# Patient Record
Sex: Male | Born: 1940 | Race: White | Hispanic: No | Marital: Married | Smoking: Former smoker
Health system: Southern US, Community
[De-identification: ages and names within clinical notes are randomized; demographics above are authoritative.]

## PROBLEM LIST (undated history)

## (undated) DIAGNOSIS — K219 Gastro-esophageal reflux disease without esophagitis: Secondary | ICD-10-CM

## (undated) DIAGNOSIS — F419 Anxiety disorder, unspecified: Secondary | ICD-10-CM

## (undated) DIAGNOSIS — H269 Unspecified cataract: Secondary | ICD-10-CM

## (undated) DIAGNOSIS — I712 Thoracic aortic aneurysm, without rupture: Secondary | ICD-10-CM

## (undated) DIAGNOSIS — C801 Malignant (primary) neoplasm, unspecified: Secondary | ICD-10-CM

## (undated) DIAGNOSIS — I7121 Aneurysm of the ascending aorta, without rupture: Secondary | ICD-10-CM

## (undated) DIAGNOSIS — I6529 Occlusion and stenosis of unspecified carotid artery: Secondary | ICD-10-CM

## (undated) DIAGNOSIS — T8859XA Other complications of anesthesia, initial encounter: Secondary | ICD-10-CM

## (undated) DIAGNOSIS — T4145XA Adverse effect of unspecified anesthetic, initial encounter: Secondary | ICD-10-CM

## (undated) DIAGNOSIS — I739 Peripheral vascular disease, unspecified: Secondary | ICD-10-CM

## (undated) DIAGNOSIS — J449 Chronic obstructive pulmonary disease, unspecified: Secondary | ICD-10-CM

## (undated) DIAGNOSIS — G8929 Other chronic pain: Secondary | ICD-10-CM

## (undated) DIAGNOSIS — W293XXA Contact with powered garden and outdoor hand tools and machinery, initial encounter: Secondary | ICD-10-CM

## (undated) DIAGNOSIS — N189 Chronic kidney disease, unspecified: Secondary | ICD-10-CM

## (undated) DIAGNOSIS — E7800 Pure hypercholesterolemia, unspecified: Secondary | ICD-10-CM

## (undated) DIAGNOSIS — R0600 Dyspnea, unspecified: Secondary | ICD-10-CM

## (undated) DIAGNOSIS — H9190 Unspecified hearing loss, unspecified ear: Secondary | ICD-10-CM

## (undated) DIAGNOSIS — M545 Low back pain, unspecified: Secondary | ICD-10-CM

## (undated) DIAGNOSIS — I10 Essential (primary) hypertension: Secondary | ICD-10-CM

## (undated) HISTORY — DX: Essential (primary) hypertension: I10

## (undated) HISTORY — DX: Gastro-esophageal reflux disease without esophagitis: K21.9

## (undated) HISTORY — DX: Unspecified cataract: H26.9

## (undated) HISTORY — PX: HEMORRHOID SURGERY: SHX153

## (undated) HISTORY — DX: Anxiety disorder, unspecified: F41.9

## (undated) HISTORY — DX: Malignant (primary) neoplasm, unspecified: C80.1

## (undated) HISTORY — PX: BACK SURGERY: SHX140

## (undated) HISTORY — PX: FINGER REPLANTATION: SHX639

---

## 1984-10-02 HISTORY — PX: POSTERIOR LUMBAR FUSION: SHX6036

## 2007-01-09 ENCOUNTER — Ambulatory Visit: Payer: Self-pay | Admitting: Internal Medicine

## 2007-01-18 ENCOUNTER — Ambulatory Visit: Payer: Self-pay | Admitting: Gastroenterology

## 2007-01-18 ENCOUNTER — Encounter (INDEPENDENT_AMBULATORY_CARE_PROVIDER_SITE_OTHER): Payer: Self-pay | Admitting: Specialist

## 2009-10-28 ENCOUNTER — Ambulatory Visit: Payer: Self-pay | Admitting: Family Medicine

## 2009-10-28 ENCOUNTER — Encounter: Payer: Self-pay | Admitting: Family Medicine

## 2009-10-28 DIAGNOSIS — F17200 Nicotine dependence, unspecified, uncomplicated: Secondary | ICD-10-CM | POA: Insufficient documentation

## 2009-10-28 DIAGNOSIS — H9209 Otalgia, unspecified ear: Secondary | ICD-10-CM | POA: Insufficient documentation

## 2009-10-28 DIAGNOSIS — R030 Elevated blood-pressure reading, without diagnosis of hypertension: Secondary | ICD-10-CM

## 2009-10-28 LAB — CONVERTED CEMR LAB
Calcium: 9.2 mg/dL (ref 8.4–10.5)
Sodium: 137 meq/L (ref 135–145)

## 2009-11-19 ENCOUNTER — Ambulatory Visit: Payer: Self-pay | Admitting: Family Medicine

## 2009-11-19 ENCOUNTER — Encounter: Payer: Self-pay | Admitting: Family Medicine

## 2009-11-19 DIAGNOSIS — I10 Essential (primary) hypertension: Secondary | ICD-10-CM | POA: Insufficient documentation

## 2009-12-03 ENCOUNTER — Ambulatory Visit: Payer: Self-pay | Admitting: Family Medicine

## 2009-12-03 ENCOUNTER — Encounter: Payer: Self-pay | Admitting: Family Medicine

## 2009-12-03 LAB — CONVERTED CEMR LAB
CO2: 24 meq/L (ref 19–32)
Calcium: 8.4 mg/dL (ref 8.4–10.5)
Chloride: 106 meq/L (ref 96–112)
Potassium: 3.9 meq/L (ref 3.5–5.3)
Sodium: 138 meq/L (ref 135–145)

## 2009-12-23 ENCOUNTER — Ambulatory Visit: Payer: Self-pay | Admitting: Family Medicine

## 2010-01-24 ENCOUNTER — Encounter: Payer: Self-pay | Admitting: Family Medicine

## 2010-01-24 ENCOUNTER — Ambulatory Visit: Payer: Self-pay | Admitting: Family Medicine

## 2010-01-24 LAB — CONVERTED CEMR LAB
BUN: 10 mg/dL (ref 6–23)
CO2: 25 meq/L (ref 19–32)
Chloride: 105 meq/L (ref 96–112)
Creatinine, Ser: 1.12 mg/dL (ref 0.40–1.50)

## 2010-04-06 ENCOUNTER — Telehealth: Payer: Self-pay | Admitting: Family Medicine

## 2010-04-06 ENCOUNTER — Ambulatory Visit: Payer: Self-pay | Admitting: Family Medicine

## 2010-04-06 ENCOUNTER — Encounter: Payer: Self-pay | Admitting: Family Medicine

## 2010-04-07 LAB — CONVERTED CEMR LAB
LDL Cholesterol: 114 mg/dL — ABNORMAL HIGH (ref 0–99)
Triglycerides: 119 mg/dL (ref <150)
VLDL: 24 mg/dL (ref 0–40)

## 2010-07-14 ENCOUNTER — Ambulatory Visit: Payer: Self-pay | Admitting: Family Medicine

## 2010-07-14 DIAGNOSIS — L570 Actinic keratosis: Secondary | ICD-10-CM | POA: Insufficient documentation

## 2010-07-14 DIAGNOSIS — S9000XA Contusion of unspecified ankle, initial encounter: Secondary | ICD-10-CM | POA: Insufficient documentation

## 2010-10-25 ENCOUNTER — Encounter (INDEPENDENT_AMBULATORY_CARE_PROVIDER_SITE_OTHER): Payer: Self-pay | Admitting: *Deleted

## 2010-11-01 NOTE — Progress Notes (Signed)
Summary: phnm sg   Phone Note Call from Patient Call back at Home Phone [phone]   Caller: Spouse Summary of Call: Pt did not want wife to come today and she has some concerns wanted to let doctor know that pt is still SOB and smoking - also sleeps a lot - even after just walking around the yard.  she does not want husband to know that she called.   Initial call taken by: De Nurse,  April 06, 2010 9:49 AM  Follow-up for Phone Call        called her and told the summary of the visit, and that at moment no red flags.  She states he is not being honest with [place] at this momnt and he is being more sob recently and is still smoking about 1ppd. she woul really like to have his heart checked out.  Told her at this moment no real problems  Follow-up by: Antoine Primas DO,  April 06, 2010 11:10 AM

## 2010-11-01 NOTE — Assessment & Plan Note (Signed)
Summary: f/up,tcb   Vital Signs:  Patient profile:   70 year old male Height:      71 inches Weight:      185.5 pounds BMI:     25.97 Temp:     98.3 degrees F oral Pulse rate:   88 / minute BP sitting:   102 / 75  (right arm) Cuff size:   regular  Vitals Entered By: Garen Grams LPN (July 14, 2010 2:08 PM) CC: f/u Is Patient Diabetic? No Pain Assessment Patient in pain? no        Primary Care Provider:  Antoine Primas DO  CC:  f/u.  History of Present Illness: BP today:102/75 Taking Meds:yes Side Effects:doing well only dizzy after sitting for a long amount of time never fall or been close. ROS: denies Headache visual changes nausea vomiting abdominal pain numbness in extremities    Tobacco abuse-  5-6 cigs a day down from 1 ppd.  feeling good, ready to start nicorette at the beginning of next week.  Feels fairly confident that he will be able to quit.    Pt hurt his right ankle about two weeks ago when he tripped over a well, hurt for about a week, no longer hurts but still discolored and swollen but can do all regular activities.     Habits & Providers  Alcohol-Tobacco-Diet     Tobacco Status: current     Tobacco Counseling: to quit use of tobacco products     Cigarette Packs/Day: 2.0  Current Medications (verified): 1)  Hydrochlorothiazide 25 Mg Tabs (Hydrochlorothiazide) .... Take 1 Tab By Mouth Daily 2)  Adult Aspirin Ec Low Strength 81 Mg Tbec (Aspirin) .... Take 1 Daily 3)  Centrum  Tabs (Multiple Vitamins-Minerals) .Marland Kitchen.. 1 Daily 4)  Fish Oil 1000 Mg Caps (Omega-3 Fatty Acids) .Marland Kitchen.. 1 Daily 5)  Nicoderm Cq 21 Mg/24hr Pt24 (Nicotine) .Marland Kitchen.. 1 Patch Daily  Allergies (verified): No Known Drug Allergies  Past History:  Past medical, surgical, family and social histories (including risk factors) reviewed, and no changes noted (except as noted below).  Past Medical History: Reviewed history from 04/06/2010 and no changes required. denies all except neck  and lumabar chronic pain  ? hx of high LDL.   Past Surgical History: Reviewed history from 10/28/2009 and no changes required. severe finger L middle.  was sewed back on.   Lumbar diskectomy- 2004  Family History: Reviewed history from 12/23/2009 and no changes required. hard of hearing  Social History: Reviewed history from 04/06/2010 and no changes required. smokes 3 ppd-> down to 1ppd no drink no illicits lives with wife adopted two kids baby sits grandson a lot  as well.   Review of Systems       denies fever, chills, nausea, vomiting, diarrhea or constipation chest pain or SOB  Physical Exam  General:  Well-developed,well-nourished,in no acute distress; alert,appropriate and cooperative throughout examination Head:  actinic keratosis on top of head, forhead and right side of face.  Eyes:  No corneal or conjunctival inflammation noted. EOMI. Perrla.  Mouth:  multiple fillings Lungs:  coarse breath sounds throughout, improved Heart:  Normal rate and regular rhythm. S1 and S2 normal without gallop, murmur, click, rub or other extra sounds. somewhat distant Abdomen:  BS +, NT, ND Pulses:  R and L carotid,radial,femoral,dorsalis pedis and posterior tibial pulses are full and equal bilaterally Extremities:  right ankle has some mild discoloration and swelling non pitting    Impression & Recommendations:  Problem #  1:  HYPERTENSION, BENIGN (ICD-401.1) At goal will continue current treatment, will consider 1/2 tab daily but has made such great strides.  will check again in 3 months and draw labs at that time.  His updated medication list for this problem includes:    Hydrochlorothiazide 25 Mg Tabs (Hydrochlorothiazide) .Marland Kitchen... Take 1 tab by mouth daily  Orders: [facility]- Est  Level 4 (16109)  Problem # 2:  TOBACCO ABUSE (ICD-305.1) Assessment: Improved Pt is improving and has a goal to quit. Also has plan.  Pt declined wanting to go to pharm clinic.  His updated medication  list for this problem includes:    Nicoderm Cq 21 Mg/24hr Pt24 (Nicotine) .Marland Kitchen... 1 patch daily  Orders: [facility]- Est  Level 4 (60454)  Problem # 3:  BRUISED ANKLE (ICD-924.21) doing well full strength and sensation WB without problem.  Will continue.  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  Orders: [facility]- Est  Level 4 (99214) Cryo (2nd - 14th) - [facility] (17003)  Complete Medication List: 1)  Hydrochlorothiazide 25 Mg Tabs (Hydrochlorothiazide) .... Take 1 tab by mouth daily 2)  Adult Aspirin Ec Low Strength 81 Mg Tbec (Aspirin) .... Take 1 daily 3)  Centrum Tabs (Multiple vitamins-minerals) .Marland Kitchen.. 1 daily 4)  Fish Oil 1000 Mg Caps (Omega-3 fatty acids) .Marland Kitchen.. 1 daily 5)  Nicoderm Cq 21 Mg/24hr Pt24 (Nicotine) .Marland Kitchen.. 1 patch daily  Patient Instructions: 1)  I think you are doing great! 2)  Keep trying to get off the cigarettes!  I think you will feel much better and be able to do a lot more 3)  Your blood pressure is great! 4)  We froze some moles on your head today.  They will probably blister for the next week 5)  Show this to your wife 6)  I need to see you again in 3 months  Prevention & Chronic Care Immunizations   Influenza vaccine: Not documented    Tetanus booster: Not documented    Pneumococcal vaccine: Not documented    H. zoster vaccine: Not documented  Colorectal Screening   Hemoccult: Not documented    Colonoscopy: Not documented   Colonoscopy action/deferral: Not indicated  (04/06/2010)   Colonoscopy due: 10/03/2015  Other Screening   PSA: Not documented   PSA action/deferral: Discussed-PSA declined  (04/06/2010)   Smoking status: current  (07/14/2010)   Smoking cessation counseling: YES  (11/19/2009)  Lipids   Total Cholesterol: 172  (04/06/2010)   Lipid panel action/deferral: Lipid Panel ordered   LDL: 114   (04/06/2010)   LDL Direct: Not documented   HDL: 34  (04/06/2010)   Triglycerides: 119  (04/06/2010)   Lipid panel due: 04/07/2011  Hypertension   Last Blood Pressure: 102 / 75  (07/14/2010)   Serum creatinine: 1.12  (01/24/2010)   BMP action: Ordered   Serum potassium 4.2  (01/24/2010)   Basic metabolic panel due: 07/26/2010    Hypertension flowsheet reviewed?: Yes   Progress toward BP goal: At goal    Stage of readiness to change (hypertension management): Maintenance  Self-Management Support :    Hypertension self-management support: Not documented

## 2010-11-01 NOTE — Assessment & Plan Note (Signed)
Summary: FU/KH   Vital Signs:  Patient profile:   70 year old male Height:      71 inches Weight:      192.7 pounds BMI:     26.97 Temp:     97.7 degrees F oral Pulse rate:   103 / minute BP sitting:   165 / 77  (left arm) Cuff size:   regular  Vitals Entered By: Garen Grams LPN (November 19, 2009 2:34 PM) CC: f/u ear pain, BP Is Patient Diabetic? No Pain Assessment Patient in pain? no        Primary Care Provider:  Antoine Primas DO  CC:  f/u ear pain and BP.  History of Present Illness: Pt is here for f/u  1. Ear pain.  Much better since the tx.  Doing much better still has a sharpe pain from time to time but much better only uses a motrin periodically  2.  HTN-  pt now has another elevated bp reading, will have to treat.  Pt did not increase activity and did not chnage his diet much,  pt though made aware that this is of concern and will need to add a medication  3.  tobacco abuse.  still smoking Is contining to cut back as much as he feels fit at moment (inhis words)   Habits & Providers  Alcohol-Tobacco-Diet     Tobacco Status: current     Cigarette Packs/Day: 3.0  Current Medications (verified): 1)  Zyrtec Allergy 10 Mg Caps (Cetirizine Hcl) .... Take 1 Tab At Bedtime 2)  Hydrochlorothiazide 25 Mg Tabs (Hydrochlorothiazide) .... Take 1/2 Once Daily  Allergies (verified): No Known Drug Allergies  Past History:  Past medical, surgical, family and social histories (including risk factors) reviewed, and no changes noted (except as noted below).  Past Medical History: Reviewed history from 10/28/2009 and no changes required. denies all except neck and lumabr   Past Surgical History: Reviewed history from 10/28/2009 and no changes required. severe finger L middle.  was sewed back on.   Lumbar diskectomy- 2004  Family History: Reviewed history from 10/28/2009 and no changes required. deafness  Social History: Reviewed history from 10/28/2009 and no  changes required. smokes 3 ppd no drink no illicits lives with wife adopted two kids  Review of Systems       denies fever, chills, nausea, vomiting, diarrhea or constipation   Physical Exam  General:  Well-developed,well-nourished,in no acute distress; alert,appropriate and cooperative throughout examination Ears:  Left tympanic membrane much improved, minimal erythema, no effusion.  Mouth:  multiple fillings Lungs:  coarse breath sounds throughout Heart:  Normal rate and regular rhythm. S1 and S2 normal without gallop, murmur, click, rub or other extra sounds.   Impression & Recommendations:  Problem # 1:  HYPERTENSION, BENIGN (ICD-401.1) Assessment New Will start HCTZ.  get BMET f/u in 2-4 weeks and recheck BMET His updated medication list for this problem includes:    Hydrochlorothiazide 25 Mg Tabs (Hydrochlorothiazide) .Marland Kitchen... Take 1/2 once daily  Orders: [facility]- Est  Level 4 (99214)  Problem # 2:  EAR PAIN, LEFT (ICD-388.70) Assessment: Unchanged improved, looks better, will monitor Orders: [facility]- Est  Level 4 (19147)  Problem # 3:  TOBACCO ABUSE (ICD-305.1) still smoking but is decreasing.  Still not ready to quit.   Orders: [facility]- Est  Level 4 (99214)  Complete Medication List: 1)  Zyrtec Allergy 10 Mg Caps (Cetirizine hcl) .... Take 1 tab at bedtime 2)  Hydrochlorothiazide 25 Mg Tabs (  Hydrochlorothiazide) .... Take 1/2 once daily  Patient Instructions: 1)  For your head cold I want you to take Zyrtec..  Take tab nightly 2)  Your blood pressure is still high.  We are starting HCTZ take 1/2 tab once daily  3)  I want you to come back and have a blood draw in 2 weeks. 4)  Please try to ride your bike 3 times a week  5)  I want to see you again in 4-6 weeks. 6)  Tobacco is very bad for your health and your loved ones ! You should stop smoking !   Prescriptions: HYDROCHLOROTHIAZIDE 25 MG TABS (HYDROCHLOROTHIAZIDE) take 1/2 once daily  #45 x 3   Entered and Authorized  by:   Antoine Primas DO   Signed by:   Antoine Primas DO on 11/19/2009   Method used:   Print then Give to Patient   RxID:   [id number] ZYRTEC ALLERGY 10 MG CAPS (CETIRIZINE HCL) take 1 tab at bedtime  #31 x 3   Entered and Authorized by:   Antoine Primas DO   Signed by:   Antoine Primas DO on 11/19/2009   Method used:   Print then Give to Patient   RxID:   [id number]

## 2010-11-01 NOTE — Assessment & Plan Note (Signed)
Summary: f/u,blood pressure   Vital Signs:  Patient profile:   70 year old male Weight:      185.3 pounds Temp:     97.9 degrees F oral Pulse rate:   103 / minute Pulse rhythm:   regular BP sitting:   135 / 78  (left arm) Cuff size:   regular  Vitals Entered By: Loralee Pacas CMA (January 24, 2010 11:19 AM)  Primary Care Provider:  Antoine Primas DO   History of Present Illness: Pt is here for f/u  1.  HTN-  pt bp much improved.  Pt has been outside more and has decrease salt intake.  Pt denies ha, visual changes, abdominal pain or other red flags  2.  tobacco abuse.  still smoking Is contining to cut back as much as he feels fit at moment (in his words).  Down to 1ppd from 2ppd  3.  Allergic rhinitis, controlled  Pt has been outside much more doing yardwork.  Feeling good   Current Medications (verified): 1)  Zyrtec Allergy 10 Mg Caps (Cetirizine Hcl) .... Take 1 Tab At Bedtime 2)  Hydrochlorothiazide 25 Mg Tabs (Hydrochlorothiazide) .... Take 1 Tab By Mouth Daily  Allergies (verified): No Known Drug Allergies  Past History:  Past medical, surgical, family and social histories (including risk factors) reviewed, and no changes noted (except as noted below).  Past Medical History: denies all except neck and lumabar chronic pain   Past Surgical History: Reviewed history from 10/28/2009 and no changes required. severe finger L middle.  was sewed back on.   Lumbar diskectomy- 2004  Family History: Reviewed history from 12/23/2009 and no changes required. hard of hearing  Social History: Reviewed history from 10/28/2009 and no changes required. smokes 3 ppd-> down to 1ppd no drink no illicits lives with wife adopted two kids  Physical Exam  General:  Well-developed,well-nourished,in no acute distress; alert,appropriate and cooperative throughout examination Eyes:  No corneal or conjunctival inflammation noted. EOMI. Perrla.  Mouth:  multiple fillings Neck:   No deformities, masses, or tenderness noted. Lungs:  coarse breath sounds throughout, improved Heart:  Normal rate and regular rhythm. S1 and S2 normal without gallop, murmur, click, rub or other extra sounds. somewhat distant Abdomen:  BS +, NT, ND   Impression & Recommendations:  Problem # 1:  HYPERTENSION, BENIGN (ICD-401.1) Assessment Improved Get BMET today.  f/u in 3-4 months His updated medication list for this problem includes:    Hydrochlorothiazide 25 Mg Tabs (Hydrochlorothiazide) .Marland Kitchen... Take 1 tab by mouth daily  Orders: [facility] ([address]-60454) [facility]- Est Level  3 (09811)  Problem # 2:  TOBACCO ABUSE (ICD-305.1) Assessment: Improved improving still not ready to quit yet.   Orders: [facility]- Est Level  3 (91478)  Complete Medication List: 1)  Zyrtec Allergy 10 Mg Caps (Cetirizine hcl) .... Take 1 tab at bedtime 2)  Hydrochlorothiazide 25 Mg Tabs (Hydrochlorothiazide) .... Take 1 tab by mouth daily  Patient Instructions: 1)  I am so glad to see you 2)  I think you are doing great 3)  I need to draw some blood today, I will call you if anything is abnormal 4)  Your blood pressure was good today.  Keep doing what your doing! 5)  I want to see you again in 3-4 months unless you need to see me sooner.  Prevention & Chronic Care Immunizations   Influenza vaccine: Not documented    Tetanus booster: Not documented    Pneumococcal vaccine: Not documented  H. zoster vaccine: Not documented  Colorectal Screening   Hemoccult: Not documented    Colonoscopy: Not documented  Other Screening   PSA: Not documented   Smoking status: current  (12/23/2009)   Smoking cessation counseling: YES  (11/19/2009)  Lipids   Total Cholesterol: Not documented   LDL: Not documented   LDL Direct: Not documented   HDL: Not documented   Triglycerides: Not documented  Hypertension   Last Blood Pressure: 135 / 78  (01/24/2010)   Serum creatinine: 1.06  (12/03/2009)   BMP action:  Ordered   Serum potassium 3.9  (12/03/2009)   Basic metabolic panel due: 07/26/2010    Hypertension flowsheet reviewed?: Yes   Progress toward BP goal: At goal    Stage of readiness to change (hypertension management): Maintenance  Self-Management Support :    Hypertension self-management support: Not documented

## 2010-11-01 NOTE — Assessment & Plan Note (Signed)
Summary: f/u htn, lipids, smoking   Vital Signs:  Patient profile:   70 year old male Height:      71 inches Weight:      183.8 pounds BMI:     25.73 Temp:     98.1 degrees F oral Pulse rate:   61 / minute BP sitting:   122 / 73  (left arm) Cuff size:   regular  Vitals Entered By: Garen Grams LPN (April 06, 1609 10:13 AM) CC: f/u bp Is Patient Diabetic? No Pain Assessment Patient in pain? no        Primary Care Provider:  Antoine Primas DO  CC:  f/u bp.  History of Present Illness: Pt is here for f/u  1.  HTN-  pt bp much improved. At goal 122/73 today.  Pt has been outside more and has decrease salt intake.  Pt denies ha, visual changes, abdominal pain or other red flags.  Has not felt so good in a long time.  2.  tobacco abuse.  still smoking Is contining to cut back as much as he feels fit at moment (in his words).  Down toless than <1/2ppd and is on nicotine patch which has helped.  Pt would like to quit soon but states the habit is hard to break but he does not yearn for a cigerette anymore. Pt states by next time I see him he will quit.  Pt denies much sob but maybe a littl ewith activity especially chansing around his grandson but catches his breath when he stops within minutes and denies any cp or swelling of extremities.   3.  Allergic rhinitis, controlled  4.  Left foot pain, intermittant, shoots from toes to mid foot, never last more than a couple  days and does not know what brings it on, no swelling discoloration, able to still ambulate without trouble. Just wanted to have it looked at   Pt has been outside much more doing yardwork.  Feeling good   Habits & Providers  Alcohol-Tobacco-Diet     Tobacco Status: current     Tobacco Counseling: to quit use of tobacco products  Current Medications (verified): 1)  Hydrochlorothiazide 25 Mg Tabs (Hydrochlorothiazide) .... Take 1 Tab By Mouth Daily 2)  Adult Aspirin Ec Low Strength 81 Mg Tbec (Aspirin) .... Take 1  Daily 3)  Centrum  Tabs (Multiple Vitamins-Minerals) .Marland Kitchen.. 1 Daily 4)  Fish Oil 1000 Mg Caps (Omega-3 Fatty Acids) .Marland Kitchen.. 1 Daily 5)  Nicoderm Cq 21 Mg/24hr Pt24 (Nicotine) .Marland Kitchen.. 1 Patch Daily  Allergies (verified): No Known Drug Allergies  Past History:  Past Medical History: denies all except neck and lumabar chronic pain  ? hx of high LDL.  PMH-FH-SH reviewed-no changes except otherwise noted  Social History: smokes 3 ppd-> down to 1ppd no drink no illicits lives with wife adopted two kids baby sits grandson a lot  as well.   Physical Exam  General:  Well-developed,well-nourished,in no acute distress; alert,appropriate and cooperative throughout examination Head:  Normocephalic and atraumatic without obvious abnormalities. No apparent alopecia or balding. Eyes:  No corneal or conjunctival inflammation noted. EOMI. Perrla.  Mouth:  multiple fillings Lungs:  coarse breath sounds throughout, improved Heart:  Normal rate and regular rhythm. S1 and S2 normal without gallop, murmur, click, rub or other extra sounds. somewhat distant Abdomen:  BS +, NT, ND   Impression & Recommendations:  Problem # 1:  HYPERTENSION, BENIGN (ICD-401.1) Pt doing very well and at goal, will continue  HCTZ 25 and montior, no side effects.  Will see again in 3 months if not earlieer His updated medication list for this problem includes:    Hydrochlorothiazide 25 Mg Tabs (Hydrochlorothiazide) .Marland Kitchen... Take 1 tab by mouth daily  Orders: Lipid-[facility] ([address]-60454) [facility]- Est Level  3 (09811)  Problem # 2:  TOBACCO ABUSE (ICD-305.1) Doing well, cut back considerably since last visit.   Pt declined help from [place] at this time. would like to be completly cut by next visit.  Orders: [facility]- Est Level  3 (91478)  His updated medication list for this problem includes:    Nicoderm Cq 21 Mg/24hr Pt24 (Nicotine) .Marland Kitchen... 1 patch daily  Problem # 3:  Preventive Health Care (ICD-V70.0) Will get lipid panel today, may need  to start stain because pt is alreeady on fish oil.   * wife is concern of pt being sob on occasion but pt does not state this is a problem, wife also doesn't want him to know she called with this information. will call her and tell her at this moment no red flags but don't hesitate to call if needed.   Complete Medication List: 1)  Hydrochlorothiazide 25 Mg Tabs (Hydrochlorothiazide) .... Take 1 tab by mouth daily 2)  Adult Aspirin Ec Low Strength 81 Mg Tbec (Aspirin) .... Take 1 daily 3)  Centrum Tabs (Multiple vitamins-minerals) .Marland Kitchen.. 1 daily 4)  Fish Oil 1000 Mg Caps (Omega-3 fatty acids) .Marland Kitchen.. 1 daily 5)  Nicoderm Cq 21 Mg/24hr Pt24 (Nicotine) .Marland Kitchen.. 1 patch daily  Patient Instructions: 1)  Great to see you 2)  Your blood pressure is great today! 3)  Keep taking all your medication 4)  I want to check your cholesterol today,  5)  You told me by the next time I see you you will no longer be smoing!  That is a great goal and I think you can do it! 6)  I want to see you again in 3 months.   Prevention & Chronic Care Immunizations   Influenza vaccine: Not documented    Tetanus booster: Not documented    Pneumococcal vaccine: Not documented    H. zoster vaccine: Not documented  Colorectal Screening   Hemoccult: Not documented    Colonoscopy: Not documented   Colonoscopy action/deferral: Not indicated  (04/06/2010)   Colonoscopy due: 10/03/2015  Other Screening   PSA: Not documented   PSA action/deferral: Discussed-PSA declined  (04/06/2010)   Smoking status: current  (04/06/2010)   Smoking cessation counseling: YES  (11/19/2009)  Lipids   Total Cholesterol: Not documented   Lipid panel action/deferral: Lipid Panel ordered   LDL: Not documented   LDL Direct: Not documented   HDL: Not documented   Triglycerides: Not documented   Lipid panel due: 04/07/2011  Hypertension   Last Blood Pressure: 122 / 73  (04/06/2010)   Serum creatinine: 1.12  (01/24/2010)   BMP action:  Ordered   Serum potassium 4.2  (01/24/2010)   Basic metabolic panel due: 07/26/2010    Hypertension flowsheet reviewed?: Yes   Progress toward BP goal: At goal    Stage of readiness to change (hypertension management): Maintenance  Self-Management Support :    Hypertension self-management support: Not documented

## 2010-11-01 NOTE — Miscellaneous (Signed)
   Clinical Lists Changes Mrs. Nathaniel Long request that you contact her after Mr. Hoare' appt on 3/24 with you.  She will not be able to attend the appt with him on that day.  If you can call her later that afternoon or the next day to discuss his visit she will appreciate it. Abundio Miu  November 19, 2009 3:09 PM

## 2010-11-01 NOTE — Assessment & Plan Note (Signed)
Summary: F/U [facility]   Vital Signs:  Patient profile:   70 year old male Height:      71 inches Weight:      190.4 pounds BMI:     26.65 Temp:     97.9 degrees F oral Pulse rate:   96 / minute BP sitting:   173 / 79  (right arm) Cuff size:   regular  Vitals Entered By: Garen Grams LPN (December 23, 2009 2:19 PM) CC: f/u BP Is Patient Diabetic? No Pain Assessment Patient in pain? no        Primary Care Provider:  Antoine Primas DO  CC:  f/u BP.  History of Present Illness: Pt is here for f/u  1.  HTN-  pt bp still elevated but mildly improved.  Pt did not increase activity and did not chnage his diet much,  pt though made aware that this is of concern and will increase his medication denies ha, visual changes, abdominal pain or other red flags  2.  tobacco abuse.  still smoking Is contining to cut back as much as he feels fit at moment (in his words).  Continue the same   Pt has been outside much more doing yardwork.  Feeling good   Habits & Providers  Alcohol-Tobacco-Diet     Tobacco Status: current     Cigarette Packs/Day: 2.0  Current Medications (verified): 1)  Zyrtec Allergy 10 Mg Caps (Cetirizine Hcl) .... Take 1 Tab At Bedtime 2)  Hydrochlorothiazide 25 Mg Tabs (Hydrochlorothiazide) .... Take 1 Tab By Mouth Daily  Allergies (verified): No Known Drug Allergies  Past History:  Past medical, surgical, family and social histories (including risk factors) reviewed, and no changes noted (except as noted below).  Past Medical History: Reviewed history from 10/28/2009 and no changes required. denies all except neck and lumabr   Past Surgical History: Reviewed history from 10/28/2009 and no changes required. severe finger L middle.  was sewed back on.   Lumbar diskectomy- 2004  Family History: Reviewed history from 10/28/2009 and no changes required. hard of hearing  Social History: Reviewed history from 10/28/2009 and no changes required. smokes 3 ppd no  drink no illicits lives with wife adopted two kidsPacks/Day:  2.0  Review of Systems       denies fever, chills, nausea, vomiting, diarrhea or constipation   Physical Exam  General:  Well-developed,well-nourished,in no acute distress; alert,appropriate and cooperative throughout examination Eyes:  No corneal or conjunctival inflammation noted. EOMI. Perrla.  Ears:  Left tympanic membrane much improved, minimal erythema, no effusion.  Mouth:  multiple fillings Lungs:  coarse breath sounds throughout Heart:  Normal rate and regular rhythm. S1 and S2 normal without gallop, murmur, click, rub or other extra sounds.   Impression & Recommendations:  Problem # 1:  HYPERTENSION, BENIGN (ICD-401.1) Assessment Improved mildly improved, increase HCTZ to 25mg  daily.  If still  elevated will start beta blocker.  f/u 1 mo Will increase to 1 pill daily for HCTZ His updated medication list for this problem includes:    Hydrochlorothiazide 25 Mg Tabs (Hydrochlorothiazide) .Marland Kitchen... Take 1 tab by mouth daily  Orders: [facility]- Est Level  3 ([address]253)  Problem # 2:  TOBACCO ABUSE (ICD-305.1) Assessment: Unchanged still does not want to quit Orders: [facility]- Est Level  3 (66440)  Complete Medication List: 1)  Zyrtec Allergy 10 Mg Caps (Cetirizine hcl) .... Take 1 tab at bedtime 2)  Hydrochlorothiazide 25 Mg Tabs (Hydrochlorothiazide) .... Take 1 tab by mouth  daily  Patient Instructions: 1)  Good to see you 2)  Your blood pressure is still elevated. 158/78 today.  I want to increase your HCTZ to a full pill daily. 3)  I want to see you again in 1 month so we can see if we have gotten any better.  Prescriptions: HYDROCHLOROTHIAZIDE 25 MG TABS (HYDROCHLOROTHIAZIDE) take 1 tab by mouth daily  #32 x 10   Entered and Authorized by:   Antoine Primas DO   Signed by:   Antoine Primas DO on 12/23/2009   Method used:   [facility] to        [facility]  [address] [phone]* (retail)       [address]        [place], [place]  [address]       Ph: [phone]       Fax: [phone]   RxID:   [id number]   Prevention & Chronic Care Immunizations   Influenza vaccine: Not documented    Tetanus booster: Not documented    Pneumococcal vaccine: Not documented    H. zoster vaccine: Not documented  Colorectal Screening   Hemoccult: Not documented    Colonoscopy: Not documented  Other Screening   PSA: Not documented   Smoking status: current  (12/23/2009)   Smoking cessation counseling: YES  (11/19/2009)  Lipids   Total Cholesterol: Not documented   LDL: Not documented   LDL Direct: Not documented   HDL: Not documented   Triglycerides: Not documented  Hypertension   Last Blood Pressure: 173 / 79  (12/23/2009)   Serum creatinine: 1.06  (12/03/2009)   Serum potassium 3.9  (12/03/2009)   Basic metabolic panel due: 01/23/2010    Hypertension flowsheet reviewed?: Yes   Progress toward BP goal: Unchanged  Self-Management Support :    Hypertension self-management support: Not documented

## 2010-11-01 NOTE — Assessment & Plan Note (Signed)
Summary: NP,tcb   Vital Signs:  Patient profile:   70 year old male Height:      71 inches Weight:      191.5 pounds BMI:     26.81 Temp:     97.8 degrees F oral Pulse rate:   67 / minute BP sitting:   164 / 74  (left arm) Cuff size:   regular  Vitals Entered By: Garen Grams LPN (October 28, 2009 3:03 PM) CC: New Patient Is Patient Diabetic? No Pain Assessment Patient in pain? yes     Location: left ear   Primary Care Provider:  Antoine Primas DO  CC:  New Patient.  History of Present Illness: Pt is here to establish care.  Was seen by [facility] before but no longer goes there.   Problems 1.  Ear pain.  Left sided was seen at urgent care given percocet and azithromycin.  No help.  Instead onlmotrin helps.  Pt states the pain will cause him extreme pain where he can't think straight and states it radiates to the back of the ear and sometimes to the left side of his face.  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  Pt states that everyone in his family go deaf around 70 years old.  Pt states the pain has been there for about 1 week, remember starting after he blew his nose.  denis fever, chills, nausea, vomiting, diarrhea or constipation denies URI symptoms.    2.  elevated BP.  169/78 today.  COuld be due to pain.  Pt though was told he had high blood pressure at the urgent care facility.  Pt denies HA visual changes abdominal pain.    3.  Pt smokes 2ppd and wife states that is a low estimate.  Pt does want to quit but is not motivated at the moment.  Pt was told that this could be causing the high blood pressure and the ear pain.  Habits & Providers  Alcohol-Tobacco-Diet     Tobacco Status: current     Cigarette Packs/Day: 3.0  Past History:  Past Medical History: denies all except neck and lumabr   Past Surgical History: severe finger L middle.  was sewed back on.   Lumbar diskectomy- 2004  Family History: deafness  Social  History: smokes 3 ppd no drink no illicits lives with wife adopted two kidsSmoking Status:  current Packs/Day:  3.0  Review of Systems       denies fever, chills, nausea, vomiting, diarrhea or constipation   Physical Exam  General:  appears appropriate age NAD Head:  Normocephalic and atraumatic without obvious abnormalities. No apparent alopecia or balding. Eyes:  No corneal or conjunctival inflammation noted. EOMI. Perrla.  Ears:  right ear TM chronic changes  no effusion Left ear.  TM red retracted with effusion and healing hematoma at 2 o clock position.  non moveable.   Mouth:  multiple fillings Neck:  No deformities, masses, or tenderness noted. Lungs:  coarse breath sounds throughout Heart:  Normal rate and regular rhythm. S1 and S2 normal without gallop, murmur, click, rub or other extra sounds. Pulses:  R and L carotid,radial,femoral,dorsalis pedis and posterior tibial pulses are full and equal bilaterally Extremities:  No clubbing, cyanosis, edema, or deformity noted with normal full range of motion of all joints.     Impression & Recommendations:  Problem # 1:  EAR PAIN, LEFT (ICD-388.70) Assessment New will r/o with ESR.  Values state  middle ear effusion.  Will tx if infected.  will give drops and motrin see in two weeks Orders: Sed Rate (ESR)-[facility] [phone])  His updated medication list for this problem includes:    Amoxicillin-pot Clavulanate 875-125 Mg Tabs (Amoxicillin-pot clavulanate) .Marland Kitchen... Tke 1 tab by mouth two times a day for 7 days    Antipyrine-benzocaine 5.4-1.4 % Soln (Benzocaine-antipyrine) .Marland Kitchen..Marland Kitchen Two drops in ear up to every two hours as needed for 1 week  Problem # 2:  ELEVATED BLOOD PRESSURE WITHOUT DIAGNOSIS OF HYPERTENSION (ICD-796.2) Assessment: New will recheck at next visit Orders: Basic Met-[facility] [phone]) Sed Rate (ESR)-[facility] [phone])  Problem # 3:  TOBACCO ABUSE (ICD-305.1) Assessment: Unchanged does not want to quit yet  Complete  Medication List: 1)  Amoxicillin-pot Clavulanate 875-125 Mg Tabs (Amoxicillin-pot clavulanate) .... Tke 1 tab by mouth two times a day for 7 days 2)  Antipyrine-benzocaine 5.4-1.4 % Soln (Benzocaine-antipyrine) .... Two drops in ear up to every two hours as needed for 1 week 3)  Ibuprofen 800 Mg Tabs (Ibuprofen) .Marland Kitchen.. 1 tab by mouth every 6 hours as needed for pain  Patient Instructions: 1)  Very nice to meet you 2)  I want you to take a new medication called Augmentin.  Take 1 tab two times a day for 7 days 3)  I will give you a rx for motrin this should help.  Take 1 tab every 4-6 hours for pain.   4)  I will also will ggive you a drop for your ear to numb the pain.  Can use two drops every 3-4 hours aas needed for pain 5)  Please schedule a follow-up appointment in 2 weeks.  Prescriptions: IBUPROFEN 800 MG TABS (IBUPROFEN) 1 tab by mouth every 6 hours as needed for pain  #40 x 2   Entered and Authorized by:   Antoine Primas DO   Signed by:   Antoine Primas DO on 10/28/2009   Method used:   [facility]  [address] [id number]       [address]       [place], [place]  [address]       Ph: [phone]       Fax: [phone]   RxID:   [id number] ANTIPYRINE-BENZOCAINE 5.4-1.4 % SOLN (BENZOCAINE-ANTIPYRINE) two drops in ear up to every two hours as needed for 1 week  #1 bottle x 1   Entered and Authorized by:   Antoine Primas DO   Signed by:   Antoine Primas DO on 10/28/2009   Method used:   [facility]  [address] [id number]       [address]       [place], [place]  [address]       Ph: [phone]       Fax: [phone]   RxID:   [id number] AMOXICILLIN-POT CLAVULANATE 875-125 MG TABS (AMOXICILLIN-POT CLAVULANATE) tke 1 tab by mouth two times a day for 7 days  #14 x 0   Entered and Authorized by:   Antoine Primas DO   Signed by:   Antoine Primas DO on 10/28/2009   Method used:    [facility]        [facility]  [address] [id number]       [address]  [place], [place]  [address]       Ph: [phone]       Fax: [phone]   RxID:   [id number]   Laboratory Results   Blood Tests   Date/Time Received: October 28, 2009 3:48 PM  Date/Time Reported: October 28, 2009 5:06 PM   SED rate: 18 mm/hr  Comments: ...........test performed by..........Marland Kitchen [place], SMA

## 2010-11-03 NOTE — Letter (Signed)
Summary: Generic Letter  [facility]  [address]   [place], [place] [address]   Phone: [phone]  Fax: [phone]    10/25/2010  [address] [place], [place]  [address]  Dear Nathaniel Long,  We are happy to let you know that since you are covered under Medicare you are able to have a FREE visit at the [facility] to discuss your HEALTH. This is a new benefit for Medicare.  There will be no co-payment.  At this visit you will meet with Arlys John an expert in wellness and the health coach at our clinic.  At this visit we will discuss ways to keep you healthy and feeling well.  This visit will not replace your regular doctor visit and we cannot refill medications.     You will need to plan to be here at least one hour to talk about your medical history, your current status, review all of your medications, and discuss your future plans for your health.  This information will be entered into your record for your doctor to have and review.  If you are interested in staying healthy, this type of visit can help.  Please call the office at: [phone], to schedule a "Medicare Wellness Visit".  The day of the visit you should bring in all of your medications, including any vitamins, herbs, over the counter products you take.  Make a list of all the other doctors that you see, so we know who they are. If you have any other health documents please bring them.  We look forward to helping you stay healthy.  Sincerely,   [facility]  iAWV

## 2010-11-05 ENCOUNTER — Encounter: Payer: Self-pay | Admitting: *Deleted

## 2010-12-08 ENCOUNTER — Encounter: Payer: Self-pay | Admitting: Family Medicine

## 2010-12-08 ENCOUNTER — Ambulatory Visit (INDEPENDENT_AMBULATORY_CARE_PROVIDER_SITE_OTHER): Payer: Medicare HMO | Admitting: Family Medicine

## 2010-12-08 DIAGNOSIS — I10 Essential (primary) hypertension: Secondary | ICD-10-CM

## 2010-12-08 DIAGNOSIS — F17200 Nicotine dependence, unspecified, uncomplicated: Secondary | ICD-10-CM

## 2010-12-08 DIAGNOSIS — R011 Cardiac murmur, unspecified: Secondary | ICD-10-CM | POA: Insufficient documentation

## 2010-12-08 LAB — CONVERTED CEMR LAB
Albumin: 4.5 g/dL (ref 3.5–5.2)
Alkaline Phosphatase: 78 units/L (ref 39–117)
CO2: 23 meq/L (ref 19–32)
Calcium: 8.8 mg/dL (ref 8.4–10.5)
Chloride: 100 meq/L (ref 96–112)
Glucose, Bld: 114 mg/dL — ABNORMAL HIGH (ref 70–99)
Potassium: 4.3 meq/L (ref 3.5–5.3)
Sodium: 137 meq/L (ref 135–145)
Total Protein: 7.2 g/dL (ref 6.0–8.3)

## 2010-12-08 LAB — COMPREHENSIVE METABOLIC PANEL
BUN: 15 mg/dL (ref 6–23)
CO2: 23 mEq/L (ref 19–32)
Creat: 1.42 mg/dL (ref 0.40–1.50)
Glucose, Bld: 114 mg/dL — ABNORMAL HIGH (ref 70–99)
Total Bilirubin: 0.7 mg/dL (ref 0.3–1.2)
Total Protein: 7.2 g/dL (ref 6.0–8.3)

## 2010-12-08 NOTE — Assessment & Plan Note (Signed)
Pt has new murmur, 2/6 SEM LSB, will send to cardiology for evaluation with hx of htn, and smoking.  Pt does get shortness of breath with some activity.

## 2010-12-08 NOTE — Assessment & Plan Note (Signed)
Pt is now down to 1/2 ppd.  Still attempting to quit. Will discuss next time.

## 2010-12-08 NOTE — Progress Notes (Signed)
  Subjective:    Patient ID: Nathaniel Long, male    DOB: 12-06-1940, 70 y.o.   MRN: [id number]  HPI Pt states that allergies are acting up somewhat.  Pt states that his nose seems to be always congested and recently he has been shortness of breath with certain activities.  Pt denies chest pain or cough, denies fever.  1. Hypertension Blood pressure at home:has not checked in in some time but when he die said not out of the normal range Blood pressure today: 122/83 Taking Meds:yes half the hctz  Side effects:no ROS: Denies headache visual changes nausea, vomiting, chest pain or abdominal pain but + shortness of breath.  Pt states he still can do activities of daily living but is finding it harder to do some more strenuous activities such as yardwork.   2.  Pt is still smoking at this time. Pt is down to 1/2 ppd when pt used to smoke 3ppd for multiple years.      Review of Systems    see above.  Objective:   Physical Exam  General Appearance:    Alert, cooperative, no distress, appears stated age, very hard of hearing  Head:    Normocephalic, without obvious abnormality, atraumatic  Eyes:    PERRL, conjunctiva/corneas clear, EOM's intact,   Ears:    Normal TM's and external ear canals, both ears wears hearing aids  Nose:   Nares normal, septum midline, mucosa mild erythema, no drainage or sinus tenderness  Throat:   Lips, mucosa, and tongue normal; teeth and gums normal mild post nasal drip  Neck:   Supple, symmetrical, trachea midline, no adenopathy;    thyroid:  no enlargement/tenderness/nodules; no carotid   bruit or JVD     Lungs:     Clear to auscultation bilaterally but coarse.       Heart:    Regular rate and rhythm, S1 and S2 normal, 2/6 SEM LSB new in appearance with a 1/6 DM heard LUSB     Abdomen:     Soft, non-tender, bowel sounds active all four quadrants,    no masses, no organomegaly        Extremities:   Extremities normal, atraumatic, no cyanosis or edema    Pulses:   2+ and symmetric all extremities  Skin:   Skin color, texture, turgor normal, no rashes or lesions     Neurologic:   CNII-XII intact, normal strength, sensation and reflexes    throughout       Assessment & Plan:

## 2010-12-08 NOTE — Assessment & Plan Note (Addendum)
At goal will get CMET today f/u three months.  Pt will also have cardiac evaluation due to new murmur and likely longstanding untreated hypertension. No echo on record.

## 2010-12-08 NOTE — Patient Instructions (Signed)
Good to see you Your blood pressure is perfect I do hear a new sound in your heart and I think I want you to see a cardiologist.  My office will call  You with the results I want to get some labs today as well.  I want to see you again in 3 months

## 2010-12-09 ENCOUNTER — Telehealth: Payer: Self-pay | Admitting: Family Medicine

## 2010-12-09 NOTE — Telephone Encounter (Signed)
Spoke with wife, I called and cancelled referral to [facility].  Made appt with Dr.Gangi [facility] cardiology) for 3.15.12 @ 1:30pm.  Pt to arrive @ 1 to complete paperwork.  LMOVM for someone to callback to get appt details Fleeger, Maryjo Rochester

## 2010-12-09 NOTE — Telephone Encounter (Signed)
Wants Nathaniel Long to go to her heart doctor, Dr Yates Decamp, [facility], [phone] pls let them know when it is set up.

## 2011-01-27 ENCOUNTER — Encounter: Payer: Self-pay | Admitting: Family Medicine

## 2011-01-31 ENCOUNTER — Other Ambulatory Visit: Payer: Self-pay | Admitting: Family Medicine

## 2011-01-31 NOTE — Telephone Encounter (Signed)
Refill request

## 2011-02-07 ENCOUNTER — Ambulatory Visit (HOSPITAL_COMMUNITY)
Admission: RE | Admit: 2011-02-07 | Discharge: 2011-02-07 | Disposition: A | Discharge status: Home / Self Care | Payer: Medicare HMO | Source: Ambulatory Visit | Attending: Cardiology | Admitting: Cardiology

## 2011-02-07 DIAGNOSIS — F17200 Nicotine dependence, unspecified, uncomplicated: Secondary | ICD-10-CM | POA: Insufficient documentation

## 2011-02-07 DIAGNOSIS — I701 Atherosclerosis of renal artery: Secondary | ICD-10-CM | POA: Insufficient documentation

## 2011-02-07 DIAGNOSIS — I658 Occlusion and stenosis of other precerebral arteries: Secondary | ICD-10-CM | POA: Insufficient documentation

## 2011-02-07 DIAGNOSIS — E785 Hyperlipidemia, unspecified: Secondary | ICD-10-CM | POA: Insufficient documentation

## 2011-02-07 DIAGNOSIS — R0609 Other forms of dyspnea: Secondary | ICD-10-CM | POA: Insufficient documentation

## 2011-02-07 DIAGNOSIS — R0602 Shortness of breath: Secondary | ICD-10-CM | POA: Insufficient documentation

## 2011-02-07 DIAGNOSIS — I10 Essential (primary) hypertension: Secondary | ICD-10-CM | POA: Insufficient documentation

## 2011-02-07 DIAGNOSIS — I771 Stricture of artery: Secondary | ICD-10-CM | POA: Insufficient documentation

## 2011-02-07 DIAGNOSIS — I6529 Occlusion and stenosis of unspecified carotid artery: Secondary | ICD-10-CM | POA: Insufficient documentation

## 2011-02-07 DIAGNOSIS — I708 Atherosclerosis of other arteries: Secondary | ICD-10-CM | POA: Insufficient documentation

## 2011-02-07 DIAGNOSIS — R0989 Other specified symptoms and signs involving the circulatory and respiratory systems: Secondary | ICD-10-CM | POA: Insufficient documentation

## 2011-02-21 NOTE — Procedures (Signed)
NAME:  Nathaniel Long, SALMONS NO.:  [id number]  MEDICAL RECORD NO.:  [id number]           PATIENT TYPE:  O  LOCATION:  [facility]                         FACILITY:  [facility]  PHYSICIAN:  Cristy Hilts. Jacinto Halim, MD       DATE OF BIRTH:  1941/08/30  DATE OF PROCEDURE:  02/07/2011 DATE OF DISCHARGE:  02/07/2011                   PERIPHERAL VASCULAR INVASIVE PROCEDURE   PROCEDURES PERFORMED: 1. Abdominal aortogram. 2. Selective right and left renal arteriogram. 3. Abdominal aortogram with bifemoral runoff. 4. Hemodynamic pressure pullback across the left common iliac artery. 5. Arch aortogram.  INDICATION:  Nathaniel Long is a 70 year old gentleman with history of tobacco use, hypertension, hyperlipidemia who had been complaining of shortness of breath and dyspnea on exertion.  He had been complaining of lower extremity and bilateral hip claudication, right more worse than the left.  Because of abnormal lower extremity Dopplers suggesting iliac artery stenoses, he is now brought to the peripheral angiography suite to evaluate his peripheral anatomy.  Arch aortogram was performed because of a 70% stenosis of a left internal carotid artery.  Selective right and left renal arteriography was performed because of renal artery stenosis and renal atherosclerosis.  Abdominal aortogram.  Abdominal aortogram revealed 2 renal arteries on either sides.  Left renal artery bifurcates immediately into 2 renal arteries.  Both appear to have at least 50% stenosis.  Selective right and left renal arteriogram.  Selective right and left renal arteriogram revealed the right renal artery to show 50% stenosis. This is a large vessel without any gradient with 5-French catheter engagement.  Left renal artery.  Left renal artery is widely patent.  The inferior pole bifurcation vessel shows a mild disease in the ostium.  Iliac artery.  The left iliac artery shows an ostial 50% stenoses. There was a tandem 50%  stenosis.  There was a 20 mm pressure gradient across the left common iliac artery stenosis.  Distally the left external iliac artery at its origin has mild ectasia.  Internal iliac artery is widely patent.  Left superficial femoral artery is widely patent.  Below the left knee, there is three-vessel runoff with occlusion of the left anterior tibial artery in the mid segment.  It was not reconstituting; however, this was not well visualized to preserve contrast.  There is very brisk at least two-vessel runoff noted in the left lower extremity below the knee.  Right iliac artery showed a 30% to 40% stenosis without any pressure gradient across the iliac artery.  Right superficial femoral artery is widely patent with very minimal luminal irregularity.  Right popliteal artery is also widely patent.  Below the right knee, there is three-vessel runoff without any significant stenoses.  Arch aortogram.  Arch aortogram revealed Bovine arch.  It is type 2 arch.  There is moderate amount of calcification noted at the arch of the aorta and the left subclavian artery origin.  The right internal carotid artery appears to show a 60% to 70% stenosis.  Left internal carotid artery shows an ulcerated 70% stenosis.  Left external iliac artery appears to have a 70% stenosis.  Again calcification is noted at this bifurcation.  Left subclavian artery shows a proximal 50% stenosis.  Left vertebral artery was patent.  IMPRESSION: 1. 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  Left common     iliac artery showing a 50% stenosis with a 20 mm pressure gradient.     No other significant stenosis was evident.  Left lower extremity     appears to have two-vessel runoff and right lower extremity three-     vessel runoff, which was very brisk. 2. 50% stenosis in the right renal artery without any hemodynamic     significance.  No damping of the pressure waveforms with engagement     of the  right renal artery. 3. 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  Left     appears to be ulcerated; however, it is also calcified. 4. 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  RECOMMENDATIONS:  Based on the peripheral anatomy, continued medical therapy is advised at this time.  I do not think that iliac artery stenosis will explain his symptomatology.  Very brisk flow is evident in the lower extremities.  As far as internal carotid artery stenosis is concerned, the severity of the stenosis cannot be completely delineated at this point as it was a nonselective arteriogram.  Also the patient has a serum creatinine of 1.2 to 1.3 and to preserve contrast, the selective cerebral angiography was not performed.  I will continue to monitor him in the outpatient basis with carotid duplex probably in about 3 months.  I will also perform surveillance of his lower extremities in 6 months.  Smoking cessation is again indicated.  A total of 164 mL of contrast was utilized for diagnostic angiography.  TECHNIQUE OF PROCEDURE:  Under sterile precautions, using a 5-French pigtail catheter, the pigtail was advanced to the abdominal aorta and abdominal exam was performed.  Same catheter was utilized to perform abdominal exam with bifemoral runoff.  Hemodynamic monitoring.  I crossed over from the right common femoral artery to the left common femoral artery.  A catheter was placed in the left common femoral artery.  Careful pullback and hemodynamic monitoring was performed across the left iliac artery.  The catheter was then pulled out of the body.  Selective right and left renal arteriogram was performed using a 5- [place] JR-4 catheter.  Arch aortogram was performed using a 5-French pigtail catheter in the LAO projection.  The catheter exchanges were all done over a Versacore wire.  The patient tolerated the procedure well.  No immediate complications.     Cristy Hilts.  Jacinto Halim, MD     JRG/MEDQ  D:  02/07/2011  T:  02/07/2011  Job:  [id number]  cc:   Emergency Program [facility]  Electronically Signed by Yates Decamp MD on 02/21/2011 07:50:39 PM

## 2011-04-13 ENCOUNTER — Encounter: Payer: Self-pay | Admitting: Family Medicine

## 2011-04-13 ENCOUNTER — Ambulatory Visit (INDEPENDENT_AMBULATORY_CARE_PROVIDER_SITE_OTHER): Payer: Medicare HMO | Admitting: Family Medicine

## 2011-04-13 VITALS — BP 118/75 | HR 54 | Temp 97.9°F | Wt 188.0 lb

## 2011-04-13 DIAGNOSIS — I10 Essential (primary) hypertension: Secondary | ICD-10-CM

## 2011-04-13 DIAGNOSIS — I739 Peripheral vascular disease, unspecified: Secondary | ICD-10-CM

## 2011-04-13 DIAGNOSIS — L570 Actinic keratosis: Secondary | ICD-10-CM

## 2011-04-13 DIAGNOSIS — F17200 Nicotine dependence, unspecified, uncomplicated: Secondary | ICD-10-CM

## 2011-04-13 MED ORDER — HYDROCHLOROTHIAZIDE 25 MG PO TABS: 12.5000 mg | ORAL_TABLET | Freq: Every day | ORAL | Status: DC

## 2011-04-13 MED ORDER — PANTOPRAZOLE SODIUM 20 MG PO TBEC: 20.0000 mg | DELAYED_RELEASE_TABLET | Freq: Every day | ORAL | Status: DC

## 2011-04-13 NOTE — Assessment & Plan Note (Signed)
Pt declined any help will continue to cut down on own, encouraged it will help PAD.

## 2011-04-13 NOTE — Assessment & Plan Note (Signed)
Treated again, AK SK two areas.

## 2011-04-13 NOTE — Patient Instructions (Signed)
Good to see you If you can decrease the smoking, then your leg pain will get better.  Your blood pressure is perfect!  Continue all the medications I am giving you a prescription for a medicine for your stomach take 1 pill daily.  Keep taking your other medications.   I will see you again in 3 months or sooner if you need me.

## 2011-04-13 NOTE — Assessment & Plan Note (Signed)
Will continue plavix, continiue following Dr. Jacinto Halim, told stop smoking would help.

## 2011-04-13 NOTE — Progress Notes (Signed)
  Subjective:    Patient ID: Nathaniel Long, male    DOB: 1940/12/29, 69 y.o.   MRN: [id number]  HPI 70 yo male following up on  Htn-  Had a new murmur was sent to Dr. Jacinto Halim, pt found to have PAD and is being medically treated with plavix at this time.  Pt blood pressure is at goal, on metoprolol, half HCTZ, and doing well.  PAD- on plavix doing well but medicine seems to be giving him some stomach pain no change is stool color or number of bowel movements. Has an area on head he would like frozen off.  Just has gotten a little bigger, has had many moles frozen before.  Tobacco abuse.  Pt is still smoking, trying to cut down, does not want any help.    Review of Systems Denies fever, chills, nausea vomiting, dysuria, chest pain, shortness of breath dyspnea on exertion or numbness in extremities.    Objective:   Physical Exam     General Appearance:    Alert, cooperative, no distress, appears stated age, very hard of hearing Head:    Normocephalic, without obvious abnormality, atraumatic Eyes:    PERRL, conjunctiva/corneas clear, EOM's intact,  Ears:    Normal TM's and external ear canals, both ears wears hearing aids Nose:   Nares normal, septum midline, mucosa mild erythema, no drainage or sinus tenderness Throat:   Lips, mucosa, and tongue normal; teeth and gums normal mild post nasal drip Neck:   Supple, symmetrical, trachea midline, no adenopathy;    thyroid:  no enlargement/tenderness/nodules; no carotid   bruit or JVD   Lungs:     Clear to auscultation bilaterally but coarse.     Heart:    Regular rate and rhythm, S1 and S2 normal, 2/6 SEM LSB new in appearance with a 1/6 DM heard LUSB   Abdomen:     Soft, non-tender, bowel sounds active all four quadrants,    no masses, no organomegaly     Extremities:   Extremities normal, atraumatic, no cyanosis or edema Pulses:   1+ and symmetric all extremities Skin:   Has 1cm sk on top of head left side, liquid nitrogen treated.     Neurologic:   CNII-XII intact, normal strength, sensation and reflexes    Throughout still very hard of hearing.      Assessment & Plan:

## 2011-04-13 NOTE — Assessment & Plan Note (Signed)
Pt at goal continue current regimen.

## 2011-06-16 ENCOUNTER — Other Ambulatory Visit: Payer: Self-pay | Admitting: Family Medicine

## 2011-06-16 NOTE — Telephone Encounter (Signed)
Refill request

## 2011-07-27 ENCOUNTER — Ambulatory Visit: Payer: Medicare HMO | Admitting: Family Medicine

## 2011-08-08 ENCOUNTER — Encounter: Payer: Self-pay | Admitting: Home Health Services

## 2011-08-09 ENCOUNTER — Ambulatory Visit (INDEPENDENT_AMBULATORY_CARE_PROVIDER_SITE_OTHER): Payer: Medicare HMO | Admitting: Family Medicine

## 2011-08-09 ENCOUNTER — Encounter: Payer: Self-pay | Admitting: Family Medicine

## 2011-08-09 DIAGNOSIS — I10 Essential (primary) hypertension: Secondary | ICD-10-CM

## 2011-08-09 DIAGNOSIS — F17200 Nicotine dependence, unspecified, uncomplicated: Secondary | ICD-10-CM

## 2011-08-09 DIAGNOSIS — L570 Actinic keratosis: Secondary | ICD-10-CM

## 2011-08-09 NOTE — Assessment & Plan Note (Signed)
Patient is near goal at this time. Would not change any other medications do not feel that it's tighter control would make much of an improvement. Patient is very active and would not like near syncopal episodes to occur.

## 2011-08-09 NOTE — Progress Notes (Signed)
  Subjective:    Patient ID: Nathaniel Long, male    DOB: 11/09/1940, 70 y.o.   MRN: [id number]  HPI 1. Hypertension Blood pressure at home:140 SBP Blood pressure today: 143/74 Taking Meds:yes Side effects:no ROS: Denies headache visual changes nausea, vomiting, chest pain or abdominal pain or shortness of breath.  2. 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 Patient has a couple on his scalp as well as his face. Patient would like evaluation and possible more liquid nitrogen.  3. tobacco abuse: Patient is still smoking approximately one pack per day he is trying to quit but is having a hard time. Patient had been wearing the nicotine patches and 21 mcg but stated that it was giving him a rash now he is just about to 14 mcg patches but he has not been wearing them yet. Patient is not ready to set a quit date.    Review of Systems Denies fever, chills, nausea vomiting abdominal pain, dysuria, chest pain, shortness of breath dyspnea on exertion or numbness in extremities Past medical history, social, surgical and family history all reviewed.      Objective:   Physical Exam General Appearance:    Alert, cooperative, no distress, appears stated age, very hard of hearing Head:    Normocephalic, without obvious abnormality, atraumatic Eyes:    PERRL, conjunctiva/corneas clear, EOM's intact,  Ears:    Normal TM's and external ear canals, both ears wears hearing aids Nose:   Nares normal, septum midline, mucosa mild erythema, no drainage or sinus tenderness Throat:   Lips, mucosa, and tongue normal; teeth and gums normal mild post nasal drip Neck:   Supple, symmetrical, trachea midline, no adenopathy;     thyroid:  no enlargement/tenderness/nodules; no carotid   bruit or JVD     Assessment & Plan:

## 2011-08-09 NOTE — Assessment & Plan Note (Signed)
Patient after verbal consent did have some liquid nitrogen placed on 5 different actinic keratosis spots. Patient given the red flags and when to seek medical attention and the likely side effects of this procedure. Patient encouraged to wear sunscreen when he is outside so much as well as a hat on most days.

## 2011-08-09 NOTE — Assessment & Plan Note (Signed)
Discussed with patient at this time would decrease his NicoDerm patch to 14 mg and hope for improvement

## 2011-10-06 ENCOUNTER — Other Ambulatory Visit: Payer: Self-pay | Admitting: Family Medicine

## 2011-10-06 NOTE — Telephone Encounter (Signed)
Refill request

## 2012-02-12 ENCOUNTER — Other Ambulatory Visit: Payer: Self-pay | Admitting: Family Medicine

## 2012-02-21 ENCOUNTER — Encounter: Payer: Self-pay | Admitting: Family Medicine

## 2012-02-21 ENCOUNTER — Ambulatory Visit (INDEPENDENT_AMBULATORY_CARE_PROVIDER_SITE_OTHER): Payer: Medicare HMO | Admitting: Family Medicine

## 2012-02-21 VITALS — BP 109/68 | HR 54 | Temp 98.1°F | Ht 71.0 in | Wt 191.0 lb

## 2012-02-21 DIAGNOSIS — I10 Essential (primary) hypertension: Secondary | ICD-10-CM

## 2012-02-21 DIAGNOSIS — R7309 Other abnormal glucose: Secondary | ICD-10-CM

## 2012-02-21 DIAGNOSIS — R739 Hyperglycemia, unspecified: Secondary | ICD-10-CM

## 2012-02-21 DIAGNOSIS — E559 Vitamin D deficiency, unspecified: Secondary | ICD-10-CM

## 2012-02-21 DIAGNOSIS — R5383 Other fatigue: Secondary | ICD-10-CM

## 2012-02-21 DIAGNOSIS — F17200 Nicotine dependence, unspecified, uncomplicated: Secondary | ICD-10-CM

## 2012-02-21 DIAGNOSIS — L570 Actinic keratosis: Secondary | ICD-10-CM

## 2012-02-21 LAB — CBC WITH DIFFERENTIAL/PLATELET
Basophils Absolute: 0.1 10*3/uL (ref 0.0–0.1)
Eosinophils Relative: 4 % (ref 0–5)
HCT: 43.7 % (ref 39.0–52.0)
Lymphocytes Relative: 33 % (ref 12–46)
MCV: 93.4 fL (ref 78.0–100.0)
Monocytes Absolute: 0.7 10*3/uL (ref 0.1–1.0)
RDW: 13.6 % (ref 11.5–15.5)
WBC: 9.7 10*3/uL (ref 4.0–10.5)

## 2012-02-21 LAB — LIPID PANEL
HDL: 34 mg/dL — ABNORMAL LOW (ref >39)
LDL Cholesterol: 69 mg/dL (ref 0–99)
Total CHOL/HDL Ratio: 3.8 Ratio
VLDL: 26 mg/dL (ref 0–40)

## 2012-02-21 LAB — COMPREHENSIVE METABOLIC PANEL
ALT: 15 U/L (ref 0–53)
AST: 19 U/L (ref 0–37)
Albumin: 4.6 g/dL (ref 3.5–5.2)
Calcium: 9.1 mg/dL (ref 8.4–10.5)
Chloride: 103 mEq/L (ref 96–112)
Creat: 1.17 mg/dL (ref 0.50–1.35)
Potassium: 4 mEq/L (ref 3.5–5.3)
Sodium: 138 mEq/L (ref 135–145)

## 2012-02-21 MED ORDER — DIPHTH-ACELL PERTUSSIS-TETANUS 15-10-5 LF-MCG/0.5 IM SUSP: 1.0000 | Freq: Once | INTRAMUSCULAR | Status: DC

## 2012-02-21 NOTE — Patient Instructions (Signed)
It is good to see you. I am glad you are doing so well. I will get some labs and I will call you with the results. It has been wonderful getting to know you. If you need anything in the next month please do not hesitate to call.

## 2012-02-21 NOTE — Assessment & Plan Note (Signed)
Patient is doing well with current regimen. We'll check a complete metabolic panel continue to monitor. Followup in 3 months. Patient is also followed by Dr. Jacinto Halim per cardiology.

## 2012-02-21 NOTE — Progress Notes (Signed)
  Subjective:    Patient ID: Nathaniel Long, male    DOB: October 21, 1940, 71 y.o.   MRN: [id number]  HPI  1. Hypertension Blood pressure at home:120 SBP Blood pressure today: 109/68 Taking Meds:yes Side effects:no ROS: Denies headache visual changes nausea, vomiting, chest pain or abdominal pain or shortness of breath. Able to do all activities of daily living without any problems.  2. 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 Patient has a couple on his scalp as well as his face. Patient would like evaluation and possible more liquid nitrogen. Denies any type of abnormal weight loss or pain in the areas.  3. tobacco abuse: Patient is still smoking but has decreased significantly.  Patient is not ready to set a quit date.    Review of Systems  Denies fever, chills, nausea vomiting abdominal pain, dysuria, chest pain, shortness of breath dyspnea on exertion or numbness in extremities Past medical history, social, surgical and family history all reviewed.      Objective:   Physical Exam  vitals reviewed General Appearance:    Alert, cooperative, no distress, appears stated age, very hard of hearing Head:    Normocephalic, without obvious abnormality, atraumatic Eyes:    PERRL, conjunctiva/corneas clear, EOM's intact,  Ears:    Normal TM's and external ear canals, both ears wears hearing aids Nose:   Nares normal, septum midline, mucosa mild erythema, no drainage or sinus tenderness Throat:   Lips, mucosa, and tongue normal; teeth and gums normal mild post nasal drip Neck:   Supple, symmetrical, trachea midline, no adenopathy;     thyroid:  no enlargement/tenderness/nodules; no carotid   bruit or JVD     Assessment & Plan:

## 2012-02-21 NOTE — Assessment & Plan Note (Signed)
After verbal consent patient did have nitrogen applied to the actinic keratosis numbers five mostly on the scalp and temporal areas.

## 2012-03-07 ENCOUNTER — Telehealth: Payer: Self-pay | Admitting: Family Medicine

## 2012-03-07 NOTE — Telephone Encounter (Signed)
No answer and VM has not been set up.  If she calls back please let her know that per Dr. Katrinka Blazing the labs were normal. Relena Ivancic, Maryjo [place]

## 2012-03-07 NOTE — Telephone Encounter (Signed)
Wife is calling because she hasn't gotten the lab results.

## 2012-03-07 NOTE — Telephone Encounter (Signed)
Wife called back and the message was given.

## 2012-04-15 ENCOUNTER — Other Ambulatory Visit: Payer: Self-pay | Admitting: Family Medicine

## 2012-04-18 ENCOUNTER — Telehealth: Payer: Self-pay | Admitting: Family Medicine

## 2012-04-18 MED ORDER — PANTOPRAZOLE SODIUM 20 MG PO TBEC: 20.0000 mg | DELAYED_RELEASE_TABLET | Freq: Every day | ORAL | Status: DC

## 2012-04-18 NOTE — Telephone Encounter (Signed)
RX sent to pharmacy and patient notified. Appointment scheduled with new PCP.

## 2012-04-18 NOTE — Telephone Encounter (Signed)
Rx sent to pharmacy and appointment scheduled with new PCP.

## 2012-04-18 NOTE — Telephone Encounter (Signed)
Patient says pharmacy faxed 3 times request for patient's pantoprozole med.  Patient is completely out of and is having problems with his stomach.

## 2012-05-08 ENCOUNTER — Ambulatory Visit (INDEPENDENT_AMBULATORY_CARE_PROVIDER_SITE_OTHER): Payer: Medicare HMO | Admitting: Family Medicine

## 2012-05-08 ENCOUNTER — Encounter: Payer: Self-pay | Admitting: Family Medicine

## 2012-05-08 VITALS — BP 109/68 | HR 57 | Temp 98.3°F | Ht 71.0 in | Wt 188.0 lb

## 2012-05-08 DIAGNOSIS — I10 Essential (primary) hypertension: Secondary | ICD-10-CM

## 2012-05-08 DIAGNOSIS — F17200 Nicotine dependence, unspecified, uncomplicated: Secondary | ICD-10-CM

## 2012-05-08 DIAGNOSIS — L570 Actinic keratosis: Secondary | ICD-10-CM

## 2012-05-08 NOTE — Progress Notes (Signed)
Subjective:    Patient ID: Nathaniel Long, male    DOB: Apr 20, 1941, 71 y.o.   MRN: [id number]  HPI Patient presents for blood pressure follow-up and to meet new PCP.    1. HTN: States BP has been in the 120's/80's at home.  Denies any chest pain, light headedness, dyspnea, headache, and issues with urination.  Currently on HCTZ 12.5 mg daily and metoprolol 25 mg.  CMP and cholesterol panel from last visit within normal limits.  2. Skin lesions: one lesion on dorsal left forearm and one lesion just anterior to left TMJ. Both have come on in the last several weeks. Raised lesions and patient denies any associated symptoms with these.  Patient requests that these be frozen today.  3. Smoking cessation: patient expresses interest in quitting. Currently using the patch. States while on patch has decreased amount of smoking to none.  States he doesn't always wear the patch and when not wearing it he goes back to regular smoking habits.    Review of Systems  Respiratory: Negative for shortness of breath.   Cardiovascular: Negative for chest pain.  Genitourinary: Negative for difficulty urinating.  Neurological: Negative for dizziness and light-headedness.  All other systems reviewed and are negative.       Objective:   Physical Exam  Constitutional: He is oriented to person, place, and time. He appears well-developed and well-nourished.  HENT:  Head: Normocephalic and atraumatic.  Right Ear: Tympanic membrane normal.  Left Ear: Tympanic membrane normal.       Hearing aids in place, very hard of hearing  Cardiovascular: Normal rate, regular rhythm, normal heart sounds and intact distal pulses.   Pulmonary/Chest: Effort normal and breath sounds normal.  Neurological: He is alert and oriented to person, place, and time.  Skin:       Actinic keratosis just anterior to left TMJ. Pink rough lesion seen adjacent to scar on dorsal left forearm.  Psychiatric: He has a normal mood and affect.     Recent Results (from the past 2016 hour(s))  COMPREHENSIVE METABOLIC PANEL   Collection Time   02/21/12 10:46 AM      Component Value Range   Sodium 138  135 - 145 mEq/L   Potassium 4.0  3.5 - 5.3 mEq/L   Chloride 103  96 - 112 mEq/L   CO2 25  19 - 32 mEq/L   Glucose, Bld 91  70 - 99 mg/dL   BUN 12  6 - 23 mg/dL   Creat 4.09  8.11 - 9.14 mg/dL   Total Bilirubin 0.9  0.3 - 1.2 mg/dL   Alkaline Phosphatase 73  39 - 117 U/L   AST 19  0 - 37 U/L   ALT 15  0 - 53 U/L   Total Protein 7.1  6.0 - 8.3 g/dL   Albumin 4.6  3.5 - 5.2 g/dL   Calcium 9.1  8.4 - 78.2 mg/dL  TSH   Collection Time   02/21/12 10:46 AM      Component Value Range   TSH 3.777  0.350 - 4.500 uIU/mL  POCT GLYCOSYLATED HEMOGLOBIN (HGB A1C)   Collection Time   02/21/12 10:46 AM      Component Value Range   Hemoglobin A1C 6.0    VITAMIN D 25 HYDROXY   Collection Time   02/21/12 10:46 AM      Component Value Range   Vit D, 25-Hydroxy 27 (*) 30 - 89 ng/mL  CBC WITH DIFFERENTIAL  Collection Time   02/21/12 10:46 AM      Component Value Range   WBC 9.7  4.0 - 10.5 K/uL   RBC 4.68  4.22 - 5.81 MIL/uL   Hemoglobin 14.9  13.0 - 17.0 g/dL   HCT 54.0  98.1 - 19.1 %   MCV 93.4  78.0 - 100.0 fL   MCH 31.8  26.0 - 34.0 pg   MCHC 34.1  30.0 - 36.0 g/dL   RDW 47.8  29.5 - 62.1 %   Platelets 333  150 - 400 K/uL   Neutrophils Relative 55  43 - 77 %   Neutro Abs 5.4  1.7 - 7.7 K/uL   Lymphocytes Relative 33  12 - 46 %   Lymphs Abs 3.2  0.7 - 4.0 K/uL   Monocytes Relative 7  3 - 12 %   Monocytes Absolute 0.7  0.1 - 1.0 K/uL   Eosinophils Relative 4  0 - 5 %   Eosinophils Absolute 0.3  0.0 - 0.7 K/uL   Basophils Relative 1  0 - 1 %   Basophils Absolute 0.1  0.0 - 0.1 K/uL   Smear Review Criteria for review not met    LIPID PANEL   Collection Time   02/21/12 10:46 AM      Component Value Range   Cholesterol 129  0 - 200 mg/dL   Triglycerides 308  <657 mg/dL   HDL 34 (*) >84 mg/dL   Total CHOL/HDL Ratio 3.8      VLDL 26  0 - 40 mg/dL   LDL Cholesterol 69  0 - 99 mg/dL        Assessment & Plan:

## 2012-05-08 NOTE — Assessment & Plan Note (Signed)
Blood pressure is well controlled. Will continue patient on current medication regimen.  Will see back in 6 months to follow this up.  Also seen by Dr. Jacinto Halim per cardiology.

## 2012-05-08 NOTE — Assessment & Plan Note (Addendum)
Patient expresses desire to quit. Currently using patch. States he is doing well when using the patch and not smoking, but when not wearing patch goes back to smoking.  Will have him continue the patch.  Encouraged to continue to try to quit. Will set up with appointment for smoking cessation counseling with [facility] clinic.

## 2012-05-08 NOTE — Patient Instructions (Signed)
Nice to meet you today. Sounds like you are doing quite well with regards to your blood pressure and cholesterol. I would like to set you up with an appointment with our Pharmacist to discuss smoking cessation.  If you are interested you could also call (1 [phone] quit now) for help in quitting smoking. Please check to see if your multivitamin has vitamin D in it.  If not please get an over the counter Vitamin D supplement and take 1000 IU's daily. We will see you back for a follow-up appointment in 6 months. Thank you for your visit today.  Smoking Cessation This document explains the best ways for you to quit smoking and new treatments to help. It lists new medicines that can double or triple your chances of quitting and quitting for good. It also considers ways to avoid relapses and concerns you may have about quitting, including weight gain. NICOTINE: A POWERFUL ADDICTION If you have tried to quit smoking, you know how hard it can be. It is hard because nicotine is a very addictive drug. For some people, it can be as addictive as heroin or cocaine. Usually, people make 2 or 3 tries, or more, before finally being able to quit. Each time you try to quit, you can learn about what helps and what hurts. Quitting takes hard work and a lot of effort, but you can quit smoking. QUITTING SMOKING IS ONE OF THE MOST IMPORTANT THINGS YOU WILL EVER DO.  You will live longer, feel better, and live better.   The impact on your body of quitting smoking is felt almost immediately:   Within 20 minutes, blood pressure decreases. Pulse returns to its normal level.   After 8 hours, carbon monoxide levels in the blood return to normal. Oxygen level increases.   After 24 hours, chance of heart attack starts to decrease. Breath, hair, and body stop smelling like smoke.   After 48 hours, damaged nerve endings begin to recover. Sense of taste and smell improve.   After 72 hours, the body is virtually free of nicotine.  Bronchial tubes relax and breathing becomes easier.   After 2 to 12 weeks, lungs can hold more air. Exercise becomes easier and circulation improves.   Quitting will reduce your risk of having a heart attack, stroke, cancer, or lung disease:   After 1 year, the risk of coronary heart disease is cut in half.   After 5 years, the risk of stroke falls to the same as a nonsmoker.   After 10 years, the risk of lung cancer is cut in half and the risk of other cancers decreases significantly.   After 15 years, the risk of coronary heart disease drops, usually to the level of a nonsmoker.   If you are pregnant, quitting smoking will improve your chances of having a healthy baby.   The people you live with, especially your children, will be healthier.   You will have extra money to spend on things other than cigarettes.  FIVE KEYS TO QUITTING Studies have shown that these 5 steps will help you quit smoking and quit for good. You have the best chances of quitting if you use them together: 1. Get ready.  2. Get support and encouragement.  3. Learn new skills and behaviors.  4. Get medicine to reduce your nicotine addiction and use it correctly.  5. Be prepared for relapse or difficult situations. Be determined to continue trying to quit, even if you do not succeed at  first.  1. GET READY  Set a quit date.   Change your environment.   Get rid of ALL cigarettes, ashtrays, matches, and lighters in your home, car, and place of work.   Do not let people smoke in your home.   Review your past attempts to quit. Think about what worked and what did not.   Once you quit, do not smoke. NOT EVEN A PUFF!  2. GET SUPPORT AND ENCOURAGEMENT Studies have shown that you have a better chance of being successful if you have help. You can get support in many ways.  Tell your family, friends, and coworkers that you are going to quit and need their support. Ask them not to smoke around you.   Talk to your  caregivers (doctor, dentist, nurse, pharmacist, psychologist, and/or smoking counselor).   Get individual, group, or telephone counseling and support. The more counseling you have, the better your chances are of quitting. Programs are available at [employer] and health centers. Call your local health department for information about programs in your area.   Spiritual beliefs and practices may help some smokers quit.   Quit meters are Photographer that keep track of quit statistics, such as amount of "quit-time," cigarettes not smoked, and money saved.   Many smokers find one or more of the many self-help books available useful in helping them quit and stay off tobacco.  3. LEARN NEW SKILLS AND BEHAVIORS  Try to distract yourself from urges to smoke. Talk to someone, go for a walk, or occupy your time with a task.   When you first try to quit, change your routine. Take a different route to work. Drink tea instead of coffee. Eat breakfast in a different place.   Do something to reduce your stress. Take a hot bath, exercise, or read a book.   Plan something enjoyable to do every day. Reward yourself for not smoking.   Explore interactive web-based programs that specialize in helping you quit.  4. GET MEDICINE AND USE IT CORRECTLY Medicines can help you stop smoking and decrease the urge to smoke. Combining medicine with the above behavioral methods and support can quadruple your chances of successfully quitting smoking. The [place] Food and Drug Administration (FDA) has approved 7 medicines to help you quit smoking. These medicines fall into 3 categories.  Nicotine replacement therapy (delivers nicotine to your body without the negative effects and risks of smoking):   Nicotine gum: Available over-the-counter.   Nicotine lozenges: Available over-the-counter.   Nicotine inhaler: Available by prescription.   Nicotine nasal spray: Available by  prescription.   Nicotine skin patches (transdermal): Available by prescription and over-the-counter.   Antidepressant medicine (helps people abstain from smoking, but how this works is unknown):   Bupropion sustained-release (SR) tablets: Available by prescription.   Nicotinic receptor partial agonist (simulates the effect of nicotine in your brain):   Varenicline tartrate tablets: Available by prescription.   Ask your caregiver for advice about which medicines to use and how to use them. Carefully read the information on the package.   Everyone who is trying to quit may benefit from using a medicine. If you are pregnant or trying to become pregnant, nursing an infant, you are under age 46, or you smoke fewer than 10 cigarettes per day, talk to your caregiver before taking any nicotine replacement medicines.   You should stop using a nicotine replacement product and call your caregiver if you experience nausea, dizziness,  weakness, vomiting, fast or irregular heartbeat, mouth problems with the lozenge or gum, or redness or swelling of the skin around the patch that does not go away.   Do not use any other product containing nicotine while using a nicotine replacement product.   Talk to your caregiver before using these products if you have diabetes, heart disease, asthma, stomach ulcers, you had a recent heart attack, you have high blood pressure that is not controlled with medicine, a history of irregular heartbeat, or you have been prescribed medicine to help you quit smoking.  5. BE PREPARED FOR RELAPSE OR DIFFICULT SITUATIONS  Most relapses occur within the first 3 months after quitting. Do not be discouraged if you start smoking again. Remember, most people try several times before they finally quit.   You may have symptoms of withdrawal because your body is used to nicotine. You may crave cigarettes, be irritable, feel very hungry, cough often, get headaches, or have difficulty  concentrating.   The withdrawal symptoms are only temporary. They are strongest when you first quit, but they will go away within 10 to 14 days.  Here are some difficult situations to watch for:  Alcohol. Avoid drinking alcohol. Drinking lowers your chances of successfully quitting.   Caffeine. Try to reduce the amount of caffeine you consume. It also lowers your chances of successfully quitting.   Other smokers. Being around smoking can make you want to smoke. Avoid smokers.   Weight gain. Many smokers will gain weight when they quit, usually less than 10 pounds. Eat a healthy diet and stay active. Do not let weight gain distract you from your main goal, quitting smoking. Some medicines that help you quit smoking may also help delay weight gain. You can always lose the weight gained after you quit.   Bad mood or depression. There are a lot of ways to improve your mood other than smoking.  If you are having problems with any of these situations, talk to your caregiver. SPECIAL SITUATIONS AND CONDITIONS Studies suggest that everyone can quit smoking. Your situation or condition can give you a special reason to quit.  Pregnant women/new mothers: By quitting, you protect your baby's health and your own.   Hospitalized patients: By quitting, you reduce health problems and help healing.   Heart attack patients: By quitting, you reduce your risk of a second heart attack.   Lung, head, and neck cancer patients: By quitting, you reduce your chance of a second cancer.   Parents of children and adolescents: By quitting, you protect your children from illnesses caused by secondhand smoke.  QUESTIONS TO THINK ABOUT Think about the following questions before you try to stop smoking. You may want to talk about your answers with your caregiver.  Why do you want to quit?   If you tried to quit in the past, what helped and what did not?   What will be the most difficult situations for you after you  quit? How will you plan to handle them?   Who can help you through the tough times? Your family? Friends? Caregiver?   What pleasures do you get from smoking? What ways can you still get pleasure if you quit?  Here are some questions to ask your caregiver:  How can you help me to be successful at quitting?   What medicine do you think would be best for me and how should I take it?   What should I do if I need more help?  What is smoking withdrawal like? How can I get information on withdrawal?  Quitting takes hard work and a lot of effort, but you can quit smoking. FOR MORE INFORMATION  Smokefree.gov (http://www.davis-sullivan.com/) provides free, accurate, evidence-based information and professional assistance to help support the immediate and long-term needs of people trying to quit smoking. Document Released: 09/12/2001 Document Revised: 09/07/2011 Document Reviewed: 07/05/2009 [facility] Patient Information 2012 [place], [place].

## 2012-05-08 NOTE — Assessment & Plan Note (Signed)
Lesion on near left TMJ and on left forearm.  Frozen today.  Will continue to monitor these and consider biopsy if they persist.

## 2013-11-13 ENCOUNTER — Other Ambulatory Visit: Payer: Self-pay | Admitting: Family Medicine

## 2014-10-08 DIAGNOSIS — I6523 Occlusion and stenosis of bilateral carotid arteries: Secondary | ICD-10-CM | POA: Diagnosis not present

## 2014-10-21 ENCOUNTER — Encounter: Payer: Self-pay | Admitting: Gastroenterology

## 2014-11-13 DIAGNOSIS — R0602 Shortness of breath: Secondary | ICD-10-CM | POA: Diagnosis not present

## 2015-02-24 DIAGNOSIS — I739 Peripheral vascular disease, unspecified: Secondary | ICD-10-CM | POA: Diagnosis not present

## 2015-02-24 DIAGNOSIS — I6523 Occlusion and stenosis of bilateral carotid arteries: Secondary | ICD-10-CM | POA: Diagnosis not present

## 2015-02-24 DIAGNOSIS — I70213 Atherosclerosis of native arteries of extremities with intermittent claudication, bilateral legs: Secondary | ICD-10-CM | POA: Diagnosis not present

## 2015-02-24 DIAGNOSIS — I10 Essential (primary) hypertension: Secondary | ICD-10-CM | POA: Diagnosis not present

## 2016-11-13 ENCOUNTER — Encounter: Payer: Self-pay | Admitting: Gastroenterology

## 2016-11-22 ENCOUNTER — Encounter: Payer: Self-pay | Admitting: Gastroenterology

## 2017-01-04 ENCOUNTER — Ambulatory Visit: Payer: Medicare Other

## 2017-01-04 ENCOUNTER — Encounter: Payer: Self-pay | Admitting: Gastroenterology

## 2017-01-04 VITALS — Ht 70.25 in | Wt 190.2 lb

## 2017-01-12 ENCOUNTER — Telehealth: Payer: Self-pay

## 2017-01-12 ENCOUNTER — Encounter (INDEPENDENT_AMBULATORY_CARE_PROVIDER_SITE_OTHER): Payer: Self-pay

## 2017-01-12 ENCOUNTER — Encounter: Payer: Self-pay | Admitting: Gastroenterology

## 2017-01-12 ENCOUNTER — Ambulatory Visit (INDEPENDENT_AMBULATORY_CARE_PROVIDER_SITE_OTHER): Payer: Medicare Other | Admitting: Gastroenterology

## 2017-01-12 VITALS — BP 100/60 | HR 66 | Ht 71.0 in | Wt 187.0 lb

## 2017-01-12 DIAGNOSIS — Z7902 Long term (current) use of antithrombotics/antiplatelets: Secondary | ICD-10-CM | POA: Diagnosis not present

## 2017-01-12 DIAGNOSIS — Z1211 Encounter for screening for malignant neoplasm of colon: Secondary | ICD-10-CM | POA: Diagnosis not present

## 2017-01-12 MED ORDER — NA SULFATE-K SULFATE-MG SULF 17.5-3.13-1.6 GM/177ML PO SOLN: ORAL | 0 refills | Status: DC

## 2017-01-12 NOTE — Telephone Encounter (Signed)
Left message for Dr Nadyne Coombes office to verify plavix use.  Spoke with pharmacy regarding patients plavix and they have not filled this since June 2017.  Patient says he gets plavix filled at [facility].  Will wait on Dr Nadyne Coombes response.    Per Dr Nadyne Coombes nurse patient was seen in December and was told to continue medication.  Verbal from [place] ok for patient stop plavix 5 days prior, if biopsy done hold 4 days after otherwise resume same day.

## 2017-01-12 NOTE — Patient Instructions (Addendum)
You have been scheduled for a colonoscopy. Please follow written instructions given to you at your visit today.  Please pick up your prep supplies at the pharmacy within the next 1-3 days. If you use inhalers (even only as needed), please bring them with you on the day of your procedure.  Stop plavix 01/13/2017, if they do a biopsy restart the plavix 4 days later.  If they do not do a biopsy restart plavix that day.  We have given you a sample of Suprep  Normal BMI (Body Mass Index- based on height and weight) is between 19 and 25. Your BMI today is Body mass index is 26.08 kg/m.  Please consider follow up  regarding your BMI with your Primary Care Provider.  Thank you!

## 2017-01-12 NOTE — Progress Notes (Signed)
Reviewed and agree with management plan.  Malcolm T. Stark, MD FACG 

## 2017-01-12 NOTE — Progress Notes (Signed)
01/12/2017 Nathaniel Long [id number] [birth year]   HISTORY OF PRESENT ILLNESS:  This is a 76 year old male who is previously known to Dr. Fuller Plan for colonoscopy in 01/2007 at which time he had internal hemorrhoids and some polyps that were removed, but were hyperplastic on pathology.  He is here today for another screening colonoscopy.  He is on Plavix, which he says that he has been on for at least 7 years or so.  Cardiologist is Dr. Einar Gip.  Patient denies any GI complaints.  Patient is EXTREMELY HOH, making entire history difficult to obtain.   Past Medical History:  Diagnosis Date  . Anxiety   . GERD (gastroesophageal reflux disease)   . Hypertension    History reviewed. No pertinent surgical history.  reports that he has been smoking Cigarettes.  He has been smoking about 1.00 pack per day. He has never used smokeless tobacco. He reports that he does not drink alcohol. His drug history is not on file. family history is not on file. Allergies  Allergen Reactions  . Statins     Muscle weakness      Outpatient Encounter Prescriptions as of 01/12/2017  Medication Sig  . ALPRAZolam (XANAX) 0.5 MG tablet Take 0.5 mg by mouth 3 (three) times daily as needed for anxiety.  . cetirizine (ZYRTEC) 10 MG tablet TAKE 1 TABLET BY MOUTH AT BEDTIME  . clopidogrel (PLAVIX) 75 MG tablet Take 75 mg by mouth daily.    . Dipth, Acell Pertus, Tetanus 15-10-5 LF-MCG/0.5 SUSP Inject 1 vial into the muscle once.  . fish oil-omega-3 fatty acids 1000 MG capsule Take 1 g by mouth daily.    Marland Kitchen gabapentin (NEURONTIN) 100 MG capsule Take 200 mg by mouth at bedtime.  . hydrochlorothiazide 25 MG tablet Take 0.5 tablets (12.5 mg total) by mouth daily.  Marland Kitchen lisinopril-hydrochlorothiazide (PRINZIDE,ZESTORETIC) 10-12.5 MG tablet Take 1 tablet by mouth daily.  . metoprolol succinate (TOPROL-XL) 25 MG 24 hr tablet Take 25 mg by mouth daily.    . Multiple Vitamins-Minerals (CENTRUM PO) Take 1 tablet by mouth daily.      . pantoprazole (PROTONIX) 20 MG tablet Take 1 tablet (20 mg total) by mouth daily. (Patient taking differently: Take 40 mg by mouth daily. )  . Pitavastatin Calcium (LIVALO) 4 MG TABS Take 0.5 mg by mouth.  . pravastatin (PRAVACHOL) 40 MG tablet Take 40 mg by mouth daily.    . Na Sulfate-K Sulfate-Mg Sulf 17.5-3.13-1.6 GM/180ML SOLN As directed   No facility-administered encounter medications on file as of 01/12/2017.      REVIEW OF SYSTEMS  : All other systems reviewed and negative except where noted in the History of Present Illness.   PHYSICAL EXAM: BP 100/60   Pulse 66   Ht '5\' 11"'$  (1.803 m)   Wt 187 lb (84.8 kg)   BMI 26.08 kg/m  General: Well developed white male in no acute distress Head: Normocephalic and atraumatic Eyes:  Sclerae anicteric, conjunctiva pink. Ears: Normal auditory acuity Lungs: Clear throughout to auscultation Heart: Regular rate and rhythm Abdomen: Soft, non-distended. Normal bowel sounds.  Non-tender. Rectal:  Will be done at the time of colonoscopy. Musculoskeletal: Symmetrical with no gross deformities  Skin: No lesions on visible extremities Extremities: No edema  Neurological: Alert oriented x 4, grossly non-focal Psychological:  Alert and cooperative. Normal mood and affect  ASSESSMENT AND PLAN: *Screening colonoscopy:  Last was 01/2007.  Already scheduled with Dr. Fuller Plan. *Chronic antiplatelet use with Plavix:  Hold for 5 days before procedure - will instruct when and how to resume after procedure. Risks and benefits of procedure including bleeding, perforation, infection, missed lesions, medication reactions and possible hospitalization or surgery if complications occur explained. Additional rare but real risk of cardiovascular event such as heart attack or ischemia/infarct of other organs off of Plavix explained and need to seek urgent help if this occurs. Will communicate by phone or EMR with patient's prescribing provider, Dr. Einar Gip, to confirm  that holding Plavix is reasonable in this case.    CC:  [name], MD

## 2017-01-18 ENCOUNTER — Ambulatory Visit (AMBULATORY_SURGERY_CENTER): Payer: Medicare Other | Admitting: Gastroenterology

## 2017-01-18 ENCOUNTER — Encounter: Payer: Self-pay | Admitting: Gastroenterology

## 2017-01-18 VITALS — BP 94/61 | HR 59 | Temp 98.0°F | Resp 10 | Ht 71.0 in | Wt 187.0 lb

## 2017-01-18 DIAGNOSIS — D120 Benign neoplasm of cecum: Secondary | ICD-10-CM

## 2017-01-18 DIAGNOSIS — Z1212 Encounter for screening for malignant neoplasm of rectum: Secondary | ICD-10-CM | POA: Diagnosis not present

## 2017-01-18 DIAGNOSIS — Z1211 Encounter for screening for malignant neoplasm of colon: Secondary | ICD-10-CM | POA: Diagnosis not present

## 2017-01-18 DIAGNOSIS — D121 Benign neoplasm of appendix: Secondary | ICD-10-CM | POA: Diagnosis not present

## 2017-01-18 MED ORDER — SODIUM CHLORIDE 0.9 % IV SOLN: 500.0000 mL | INTRAVENOUS | Status: DC

## 2017-01-18 NOTE — Progress Notes (Signed)
Called to room to assist during endoscopic procedure.  Patient ID and intended procedure confirmed with present staff. Received instructions for my participation in the procedure from the performing physician.  

## 2017-01-18 NOTE — Op Note (Signed)
[place] Patient Name: Nathaniel Long Procedure Date: 01/18/2017 11:02 AM MRN: [id number] Endoscopist: Ladene Artist , MD Age: 76 Referring MD:  Date of Birth: 10/20/1940 Gender: Male Account #: [id number] Procedure:                Colonoscopy Indications:              Screening for colorectal malignant neoplasm Medicines:                Monitored Anesthesia Care Procedure:                Pre-Anesthesia Assessment:                           - Prior to the procedure, a History and Physical                            was performed, and patient medications and                            allergies were reviewed. The patient's tolerance of                            previous anesthesia was also reviewed. The risks                            and benefits of the procedure and the sedation                            options and risks were discussed with the patient.                            All questions were answered, and informed consent                            was obtained. Prior Anticoagulants: The patient has                            taken Plavix (clopidogrel), last dose was 5 days                            prior to procedure. ASA Grade Assessment: III - A                            patient with severe systemic disease. After                            reviewing the risks and benefits, the patient was                            deemed in satisfactory condition to undergo the                            procedure.  After obtaining informed consent, the colonoscope                            was passed under direct vision. Throughout the                            procedure, the patient's blood pressure, pulse, and                            oxygen saturations were monitored continuously. The                            Colonoscope was introduced through the anus and                            advanced to the the cecum, identified by                  appendiceal orifice and ileocecal valve. The                            ileocecal valve, appendiceal orifice, and rectum                            were photographed. The quality of the bowel                            preparation was good. The colonoscopy was performed                            without difficulty. The patient tolerated the                            procedure well. Scope In: 08:67:61 AM Scope Out: 95:09:32 AM Scope Withdrawal Time: 0 hours 10 minutes 10 seconds  Total Procedure Duration: 0 hours 12 minutes 31 seconds  Findings:                 The perianal and digital rectal examinations were                            normal.                           A 6 mm polyp was found in the appendiceal orifice.                            The polyp was sessile. The polyp was removed with a                            cold snare. Resection and retrieval were complete.                           Internal hemorrhoids were found during  retroflexion. The hemorrhoids were small and Grade                            I (internal hemorrhoids that do not prolapse).                           A few small-mouthed diverticula were found in the                            sigmoid colon.                           The exam was otherwise without abnormality on                            direct and retroflexion views. Complications:            No immediate complications. Estimated blood loss:                            None. Estimated Blood Loss:     Estimated blood loss: none. Impression:               - One 6 mm polyp at the appendiceal orifice,                            removed with a cold snare. Resected and retrieved.                           - Internal hemorrhoids.                           - Diverticulosis in the sigmoid colon.                           - The examination was otherwise normal on direct                            and retroflexion  views. Recommendation:           - Repeat colonoscopy in 5 years for surveillance if                            polyp is precancerous, otherwise 10 years.                           - Resume Plavix (clopidogrel) tomorrow at prior                            dose. Refer to managing physician for further                            adjustment of therapy.                           - Patient has a contact number available for  emergencies. The signs and symptoms of potential                            delayed complications were discussed with the                            patient. Return to normal activities tomorrow.                            Written discharge instructions were provided to the                            patient.                           - Resume previous diet.                           - Continue present medications.                           - Await pathology results. Ladene Artist, MD 01/18/2017 11:23:02 AM This report has been signed electronically.

## 2017-01-18 NOTE — Progress Notes (Signed)
Report given to PACU, vss 

## 2017-01-18 NOTE — Patient Instructions (Signed)
Discharge instructions given. Handouts on polyps,diverticulosis and hemorrhoids. Resume Plavix tomorrow. Resume medications today. YOU HAD AN ENDOSCOPIC PROCEDURE TODAY AT [facility]:   Refer to the procedure report that was given to you for any specific questions about what was found during the examination.  If the procedure report does not answer your questions, please call your gastroenterologist to clarify.  If you requested that your care partner not be given the details of your procedure findings, then the procedure report has been included in a sealed envelope for you to review at your convenience later.  YOU SHOULD EXPECT: Some feelings of bloating in the abdomen. Passage of more gas than usual.  Walking can help get rid of the air that was put into your GI tract during the procedure and reduce the bloating. If you had a lower endoscopy (such as a colonoscopy or flexible sigmoidoscopy) you may notice spotting of blood in your stool or on the toilet paper. If you underwent a bowel prep for your procedure, you may not have a normal bowel movement for a few days.  Please Note:  You might notice some irritation and congestion in your nose or some drainage.  This is from the oxygen used during your procedure.  There is no need for concern and it should clear up in a day or so.  SYMPTOMS TO REPORT IMMEDIATELY:   Following lower endoscopy (colonoscopy or flexible sigmoidoscopy):  Excessive amounts of blood in the stool  Significant tenderness or worsening of abdominal pains  Swelling of the abdomen that is new, acute  Fever of 100F or higher   For urgent or emergent issues, a gastroenterologist can be reached at any hour by calling [phone].   DIET:  We do recommend a small meal at first, but then you may proceed to your regular diet.  Drink plenty of fluids but you should avoid alcoholic beverages for 24 hours.  ACTIVITY:  You should plan to take it easy for the  rest of today and you should NOT DRIVE or use heavy machinery until tomorrow (because of the sedation medicines used during the test).    FOLLOW UP: Our staff will call the number listed on your records the next business day following your procedure to check on you and address any questions or concerns that you may have regarding the information given to you following your procedure. If we do not reach you, we will leave a message.  However, if you are feeling well and you are not experiencing any problems, there is no need to return our call.  We will assume that you have returned to your regular daily activities without incident.  If any biopsies were taken you will be contacted by phone or by letter within the next 1-3 weeks.  Please call us at [phone] if you have not heard about the biopsies in 3 weeks.    SIGNATURES/CONFIDENTIALITY: You and/or your care partner have signed paperwork which will be entered into your electronic medical record.  These signatures attest to the fact that that the information above on your After Visit Summary has been reviewed and is understood.  Full responsibility of the confidentiality of this discharge information lies with you and/or your care-partner.

## 2017-01-19 ENCOUNTER — Telehealth: Payer: Self-pay | Admitting: *Deleted

## 2017-01-19 NOTE — Telephone Encounter (Signed)
  Follow up Call-  Call back number 01/18/2017  Post procedure Call Back phone  # [phone]  Permission to leave phone message Yes  Some recent data might be hidden    Spoke with wife Patient questions:  Do you have a fever, pain , or abdominal swelling? No. Pain Score  0 *  Have you tolerated food without any problems? Yes.    Have you been able to return to your normal activities? Yes.    Do you have any questions about your discharge instructions: Diet   No. Medications  No. Follow up visit  No.  Do you have questions or concerns about your Care? No.  Actions: * If pain score is 4 or above: No action needed, pain <4.

## 2017-01-30 ENCOUNTER — Encounter: Payer: Self-pay | Admitting: Gastroenterology

## 2017-04-30 NOTE — H&P (Signed)
OFFICE VISIT NOTES COPIED TO EPIC FOR DOCUMENTATION  . History of Present Illness Laverda Page MD; 03/22/2017 10:47 PM) Patient words: Last OV 09/07/2016; 6 month FU and carotid.  The patient is a 76 year old male who presents for a Follow-up for Peripheral vascular disease.  Additional reasons for visit:  Follow-up for Carotid artery stenosis is described as the following: Caucasian male with history of hypertension, hyperlipidemia, peripheral arterial disease who presents here for follow-up of claudication and carotid disease. He is hard of hearing. He is still tolerating Livalo without side effects.  Patient presents for 6 month follow up for carotid artery stenosis. Tolerating all other medications well. He also has asymptomatic carotid artery stenosis. Unfortunately, he continues to smoke and states that it is difficult for him to quit smoking.  For the 1st time in few years, he states that his activity is now limited due to severe bilateral hip pain when he walks. Right is worse than the left but he also states that he has spinal stenosis and sciatica involving the right leg. But overall states that he is unable to do his routine chores without having to sit down frequently. No TIA, no chest pain or shortness of breath or PND or orthopnea. 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 No hemodynamically significant stenosis is identified on either side. This exam reveals moderately decreased perfusion of both the lower extremities noted at the post tibial artery level. 2. RABI 0.73, LABI 0.67. Compared to Compared to the study  done on 07/20/11: no significant change. Study suggests diffuse below knee disease. Follow up study if clinically indicated. Bilateral carotid artery stenosis (L93.57)  Carotid artery duplex 03/08/2017: Minimal Stenosis in the right internal carotid artery (1-15). Mild stenosis in the right external carotid artery (<50%). Stenosis of right common carotid artery of <50%. Stenosis in the left internal carotid artery (50-69%). Stenosis in the left common carotid artery (>50%). Mild stenosis in the left external carotid artery (<50%). Left vertebral artery waveform shows high resistance. Antegrade right vertebral artery flow. Follow up in six months is appropriate if clinically indicated. Compared to the study done on 09/05/16, Right ICA stenosis of 50-69% stenosis not noted. No change in left ICA stenosis. Recheck in 6 months. Tobacco use disorder, continuous (F17.209)  Hyperlipidemia, group A (E78.00)  Shortness of breath at rest (R06.02)  Seasonal allergies (J30.2)  Chronic kidney disease, stage 3 (N18.3)  Labwork  01/09/2017: CBC normal. Potassium 4.1, creatinine 1.7, EGFR 48, CMP otherwise normal. Cholesterol 138, triglycerides 153, HDL 32, LDL 75. Labs 06/21/2016: Sodium 137, potassium 4.0, BUN 18, creatinine 1.43, eGFR 54 mL. CMP otherwise normal. Total cholesterol 135, triglycerides 99, HDL 35, LDL 91. A1c 6.0%. PSA normal. Lipid profile 10/06/2014: Total cholesterol 167, triglycerides 166, HDL 35, LDL 99. BUN 18, serum creatinine 1.46, eGFR 47 mL. Potassium 4.8. CMP otherwise normal. CBC normal with a hemoglobin of 14.5/hematocrit 42.2, radiates 327. Labs 08/24/2014: Total cholesterol 144, triglycerides 105, HDL 41, LDL 82, LDL particle #846, LP-IR score 46. Labs 11/13/2014: Creatinine 1.31, CMP otherwise normal. Labs 08/24/2014: Creatinine 1.47, EGFR 47, potassium 4.4, CMP otherwise normal, CBC normal Labs 08/22/2013: Total cholesterol 196, triglycerides 132, HDL  37, LDL 133. LDL particle number  severely elevated at 2123. LP-IR score 64 suggest insulin resistance. Hepatic and was normal. 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 Mother died at age 61. 5 siblings (2 deceased).  Mother  Deceased. at age 79 Father  Deceased. at age 12, from [place] 2  1 Deceased 1 living, older Sister 2  Both Deceased  Social History (April Harrington; Apr 03, 2017 1:57 PM) Current tobacco use  Current every day smoker. 1 ppd smoker Non Drinker/No Alcohol Use  Marital status  Married. Number of Children  7. (3 biological, 2 adopted)  Past Surgical History (April Harrington; Apr 03, 2017 1:57 PM) Back surgery in 1986.  LE arteriogram 02/07/11: Moderate left CIA stenosis of 50% with 20 mm Hg PG only. Mild disease right iliac artery.  left anterior tibial Artery appears occluded. Bilateral carotid artery 70% stenosis, left appears ulcerated. Left proximal subclavian 50% stenosis.   Medication History (April Harrington; 2017-04-03 2:07 PM) ALPRAZolam (0.5MG Tablet, 1 (one) Tablet Oral three times daily, as needed for anxiety for smoking cessation., Taken starting 03/19/2017) Active. Livalo (4MG Tablet,  (one half) Tablet Oral daily, Taken starting 11/22/2016) Active. (P/A approved on 06/16/2013-10/01/2013) Lisinopril-Hydrochlorothiazide (10-12.5MG Tablet, 1 Tablet Oral in the morning, Taken starting 11/01/2016) Active. Metoprolol Succinate ER (25MG Tablet ER 24HR, 1 (one) Table Tablet ER 24H Oral daily, Taken starting 10/03/2016) Active. Clopidogrel Bisulfate (75MG Tablet, 1 (one) Tablet Tablet Oral daily, Taken starting 08/18/2016) Active. Pantoprazole Sodium (40MG Tablet DR, 1 (one) Tablet DR Tablet DR Oral daily, Taken starting 08/18/2016)  Active. Cetirizine HCl (10MG Tablet, 1 Tablet Tablet Tablet Tablet Oral daily as needed, Taken starting 04/01/2015) Active. Plavix (75MG Tablet, 1 Tablet Tablet Tablet Tablet Oral daily, after a meal, Taken starting 10/14/2012) Active. Fish Oil (1000MG Capsule, 1 Oral daily) Active. Multivitamins (1 Oral daily) Active. Vitamin D3 (1000UNIT Tablet, 2 Oral daily) Active. Gabapentin (100MG Capsule, 2 Oral at bedtime) Active. Medications Reconciled (Verbally; Pt states no changes)  Diagnostic Studies History Anderson Malta Sergeant; 2017/04/03 1:57 PM) Carotid Doppler [03/08/2017]: Minimal Stenosis in the right internal carotid artery (1-15). Mild stenosis in the right external carotid artery (<50%). Stenosis of right common carotid artery of <50%. Stenosis in the left internal carotid artery (50-69%). Stenosis in the left common carotid artery (>50%). Mild stenosis in the left external carotid artery (<50%). Left vertebral artery waveform shows high resistance. Antegrade right vertebral artery flow. Follow up in six months is appropriate if clinically indicated. Compared to the study done on 09/05/16, Right ICA stenosis of 50-69% stenosis not noted. No change in left ICA stenosis. Recheck in 6 months.  Other Problems (April Harrington; 2017-04-03 1:57 PM) Carotid Artery Duplex [10/08/2014]: 1. Mild stenosis of the right distal internal carotid artery, mid internal carotid artery and proximal internal carotid artery (<50% stenosis in the range of 16-49%). There is evidence of heterogeneous plaque in the right external carotid artery. 2. Moderate stenosis of the left distal internal carotid artery, mid internal carotid artery and proximal internal carotid artery (>50% stenosis in the range of 50-69%). Mild stenosis of the left bulb (<50% stenosis). There is evidence of heterogeneous plaque in the left external carotid artery. There is evidence of significant homogeneous plaque in the left proximal and  mid common carotid artery. 3. No significant change from 03/30/2014.  Follow up in six months is appropriate if clinically indicated. Unspecified Diagnosis     Review of Systems Laverda Page MD; 03/22/2017 2:58 PM) General Present- Feeling well. Not Present- Anorexia, Fatigue and Fever. Respiratory Not Present- Cough and Decreased Exercise Tolerance. Cardiovascular Present- Claudications. Not Present- Chest Pain, Edema, Orthopnea, Paroxysmal Nocturnal Dyspnea and Shortness of Breath. Gastrointestinal Not Present- Change in Bowel Habits, Constipation and Nausea. Musculoskeletal Present- Joint Pain (back pain and sciatica right leg). Neurological Not Present- Focal Neurological Symptoms. Endocrine Not Present- Appetite Changes, Cold Intolerance and Heat Intolerance. Hematology Not Present- Anemia, Petechiae and Prolonged Bleeding.  Vitals (April Harrington; 03/22/2017 2:05 PM) 03/22/2017 2:00 PM Weight: 185 lb Height: 71in Body Surface Area: 2.04 m Body Mass Index: 25.8 kg/m  Pulse: 55 (Regular)  P.OX: 96% (Room air) BP: 102/78 (Sitting, Left Arm, Standard)       Physical Exam Laverda Page MD; 03/22/2017 2:58 PM) General Mental Status-Alert. General Appearance-Cooperative, Appears stated age, Not in acute distress. Orientation-Oriented X3. Build & Nutrition-Well built and Well nourished.  Head and Neck Thyroid Gland Characteristics - no palpable nodules, no palpable enlargement.  Chest and Lung Exam Palpation Tender - No chest wall tenderness.  Cardiovascular Inspection Jugular vein - Right - No Distention. Auscultation Heart Sounds - S1 WNL, S2 WNL and No gallop present. Murmurs & Other Heart Sounds: Murmur - Location - Sternal Border - Right. Timing - Early systolic. Grade - II/VI.  Abdomen Palpation/Percussion Normal exam - Non Tender and No hepatosplenomegaly. Auscultation Normal exam - Bowel sounds normal.  Peripheral  Vascular Lower Extremity Inspection - Bilateral - No Pigmentation, No Ulcerations, no Varicose ulcers. Palpation - Edema - Bilateral - No edema. Femoral pulse - Bilateral - 2+(loud bruit bilateral). Popliteal pulse - Bilateral - 2+. Dorsalis pedis pulse - Left - Feeble. Right - 1+. Posterior tibial pulse - Bilateral - Absent. Carotid arteries - Bilateral-Carotid bruit(left more pronounced). Abdomen-Epigastric bruit present, No prominent abdominal aortic pulsation.  Neurologic Motor-Grossly intact without any focal deficits.  Musculoskeletal Global Assessment Left Lower Extremity - normal range of motion without pain. Right Lower Extremity - normal range of motion without pain.    Assessment & Plan Laverda Page MD; 03/22/2017 10:50 PM) Bilateral carotid artery stenosis (Q22.29) Story: Carotid artery duplex 03/08/2017: Minimal Stenosis in the right internal carotid artery (1-15). Mild stenosis in the right external carotid artery (<50%). Stenosis of right common carotid artery of <50%. Stenosis in the left internal carotid artery (50-69%). Stenosis in the left common carotid artery (>50%). Mild stenosis in the left external carotid artery (<50%). Left vertebral artery waveform shows high resistance. Antegrade right vertebral artery flow. Follow up in six months is appropriate if clinically indicated. Compared to the study done on 09/05/16, Right ICA stenosis of 50-69% stenosis not noted. No change in left ICA stenosis. Recheck in 6 months. Future Plans 09/11/2017: Bilateral duplex scan of carotid arteries (79892) - one time Atherosclerosis of native artery of both lower extremities with intermittent claudication (J19.417) Story: LE arterial duplex 08/05/12: 1. No hemodynamically significant stenosis is identified on either side. This exam reveals moderately decreased perfusion of both the lower extremities noted at the post tibial artery level. 2. RABI 0.73, LABI 0.67. Compared  to Compared to the study done on 07/20/11: no significant change. Study suggests diffuse below knee disease. Follow up study if clinically indicated. Essential hypertension, benign (I10) Impression: EKG 03/13/2017: Sinus bradycardia at the rate of 55 bpm, normal axis, LVH with repolarization abnormality, cannot  exclude lateral ischemia. PVC. No significant change from EKG 09/07/2016. Current Plans Complete electrocardiogram (93000) Labwork Story: 01/09/2017: CBC normal. Potassium 4.1, creatinine 1.7, EGFR 48, CMP otherwise normal. Cholesterol 138, triglycerides 153, HDL 32, LDL 75.  Labs 06/21/2016: Sodium 137, potassium 4.0, BUN 18, creatinine 1.43, eGFR 54 mL. CMP otherwise normal. Total cholesterol 135, triglycerides 99, HDL 35, LDL 91. A1c 6.0%. PSA normal.  Lipid profile 10/06/2014: Total cholesterol 167, triglycerides 166, HDL 35, LDL 99. BUN 18, serum creatinine 1.46, eGFR 47 mL.  Potassium 4.8. CMP otherwise normal. CBC normal with a hemoglobin of 14.5/hematocrit 42.2, radiates 327.  Labs 08/24/2014: Total cholesterol 144, triglycerides 105, HDL 41, LDL 82, LDL particle #846, LP-IR score 46.  Labs 11/13/2014: Creatinine 1.31, CMP otherwise normal. Labs 08/24/2014: Creatinine 1.47, EGFR 47, potassium 4.4, CMP otherwise normal, CBC normal  Labs 08/22/2013: Total cholesterol 196, triglycerides 132, HDL 37, LDL 133. LDL particle number severely elevated at 2123. LP-IR score 64 suggest insulin resistance. Hepatic and was normal. 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 I discussed primary/secondary prevention and also dietary counceling was done. He states that he is unable to perform activities of daily living and has developed marked limitation with regard to doing his routine chores. Given abnormal physical  exam and abnormal ABI in the past, I have recommended that we proceed directly with peripheral arteriogram to specifically look for bilateral iliac artery stenosis. He may has will also have neuropathic pain but clearly has abnormal physical exam. He has renal insufficiency, hence contrast load needs to be kept in mind he probably has small vessel disease involving below the knee vessels. I'll see him back after angiography and make further recommendation. Carotid artery disease appears to be stable, blood pressure is well controlled and lipids are being managed by his PCP and well controlled.  CC Dr. [name].    Signed by Laverda Page, MD (03/24/2017 5:26 PM)

## 2017-05-01 ENCOUNTER — Encounter (HOSPITAL_COMMUNITY)
Admission: RE | Disposition: A | Discharge status: Home / Self Care | Payer: Self-pay | Source: Ambulatory Visit | Attending: Cardiology

## 2017-05-01 ENCOUNTER — Ambulatory Visit (HOSPITAL_COMMUNITY)
Admission: RE | Admit: 2017-05-01 | Discharge: 2017-05-01 | Disposition: A | Discharge status: Home / Self Care | Payer: Medicare Other | Source: Ambulatory Visit | Attending: Cardiology | Admitting: Cardiology

## 2017-05-01 DIAGNOSIS — I129 Hypertensive chronic kidney disease with stage 1 through stage 4 chronic kidney disease, or unspecified chronic kidney disease: Secondary | ICD-10-CM | POA: Insufficient documentation

## 2017-05-01 DIAGNOSIS — I701 Atherosclerosis of renal artery: Secondary | ICD-10-CM | POA: Diagnosis not present

## 2017-05-01 DIAGNOSIS — I700 Atherosclerosis of aorta: Secondary | ICD-10-CM | POA: Diagnosis not present

## 2017-05-01 DIAGNOSIS — M1610 Unilateral primary osteoarthritis, unspecified hip: Secondary | ICD-10-CM | POA: Diagnosis not present

## 2017-05-01 DIAGNOSIS — Z7902 Long term (current) use of antithrombotics/antiplatelets: Secondary | ICD-10-CM | POA: Diagnosis not present

## 2017-05-01 DIAGNOSIS — I6523 Occlusion and stenosis of bilateral carotid arteries: Secondary | ICD-10-CM | POA: Insufficient documentation

## 2017-05-01 DIAGNOSIS — E785 Hyperlipidemia, unspecified: Secondary | ICD-10-CM | POA: Diagnosis not present

## 2017-05-01 DIAGNOSIS — N183 Chronic kidney disease, stage 3 (moderate): Secondary | ICD-10-CM | POA: Diagnosis not present

## 2017-05-01 DIAGNOSIS — F17210 Nicotine dependence, cigarettes, uncomplicated: Secondary | ICD-10-CM | POA: Insufficient documentation

## 2017-05-01 DIAGNOSIS — I739 Peripheral vascular disease, unspecified: Secondary | ICD-10-CM | POA: Diagnosis present

## 2017-05-01 HISTORY — PX: ABDOMINAL AORTOGRAM W/LOWER EXTREMITY: CATH118223

## 2017-05-01 SURGERY — ABDOMINAL AORTOGRAM W/LOWER EXTREMITY
Anesthesia: LOCAL

## 2017-05-01 MED ORDER — FENTANYL CITRATE (PF) 100 MCG/2ML IJ SOLN
INTRAMUSCULAR | Status: AC
  Filled 2017-05-01: qty 2

## 2017-05-01 MED ORDER — MIDAZOLAM HCL 2 MG/2ML IJ SOLN
INTRAMUSCULAR | Status: DC | PRN
  Administered 2017-05-01: 1 mg via INTRAVENOUS

## 2017-05-01 MED ORDER — SODIUM CHLORIDE 0.9 % IV BOLUS (SEPSIS)
500.0000 mL | Freq: Once | INTRAVENOUS | Status: AC
  Administered 2017-05-01: 500 mL via INTRAVENOUS

## 2017-05-01 MED ORDER — HEPARIN (PORCINE) IN NACL 2-0.9 UNIT/ML-% IJ SOLN
INTRAMUSCULAR | Status: AC
  Filled 2017-05-01: qty 1000

## 2017-05-01 MED ORDER — SODIUM CHLORIDE 0.9 % IV SOLN: INTRAVENOUS | Status: DC

## 2017-05-01 MED ORDER — SODIUM CHLORIDE 0.9% FLUSH: 3.0000 mL | Freq: Two times a day (BID) | INTRAVENOUS | Status: DC

## 2017-05-01 MED ORDER — MIDAZOLAM HCL 2 MG/2ML IJ SOLN
INTRAMUSCULAR | Status: AC
  Filled 2017-05-01: qty 2

## 2017-05-01 MED ORDER — LIDOCAINE HCL (PF) 1 % IJ SOLN
INTRAMUSCULAR | Status: AC
  Filled 2017-05-01: qty 30

## 2017-05-01 MED ORDER — SODIUM CHLORIDE 0.9 % IV SOLN: 1.0000 mL/kg/h | INTRAVENOUS | Status: DC

## 2017-05-01 MED ORDER — HEPARIN (PORCINE) IN NACL 2-0.9 UNIT/ML-% IJ SOLN
INTRAMUSCULAR | Status: AC | PRN
  Administered 2017-05-01: 1000 mL

## 2017-05-01 MED ORDER — SODIUM CHLORIDE 0.9% FLUSH: 3.0000 mL | INTRAVENOUS | Status: DC | PRN

## 2017-05-01 MED ORDER — FENTANYL CITRATE (PF) 100 MCG/2ML IJ SOLN
INTRAMUSCULAR | Status: DC | PRN
  Administered 2017-05-01: 25 ug via INTRAVENOUS

## 2017-05-01 MED ORDER — LIDOCAINE HCL (PF) 1 % IJ SOLN
INTRAMUSCULAR | Status: DC | PRN
  Administered 2017-05-01: 18 mL

## 2017-05-01 MED ORDER — SODIUM CHLORIDE 0.9 % IV SOLN: 250.0000 mL | INTRAVENOUS | Status: DC | PRN

## 2017-05-01 MED ORDER — IODIXANOL 320 MG/ML IV SOLN
INTRAVENOUS | Status: DC | PRN
  Administered 2017-05-01: 75 mL via INTRA_ARTERIAL

## 2017-05-01 SURGICAL SUPPLY — 11 items
CATH OMNI FLUSH 5F 65CM (CATHETERS) ×1 IMPLANT
COVER PRB 48X5XTLSCP FOLD TPE (BAG) IMPLANT
COVER PROBE 5X48 (BAG) ×2
KIT MICROINTRODUCER STIFF 5F (SHEATH) ×1 IMPLANT
KIT PV (KITS) ×2 IMPLANT
SHEATH PINNACLE 5F 10CM (SHEATH) ×1 IMPLANT
SYRINGE MEDRAD AVANTA MACH 7 (SYRINGE) ×1 IMPLANT
TRANSDUCER W/STOPCOCK (MISCELLANEOUS) ×2 IMPLANT
TRAY PV CATH (CUSTOM PROCEDURE TRAY) ×2 IMPLANT
TUBING CIL FLEX 10 FLL-RA (TUBING) ×1 IMPLANT
WIRE BENTSON .035X145CM (WIRE) ×1 IMPLANT

## 2017-05-01 NOTE — Interval H&P Note (Signed)
History and Physical Interval Note:  05/01/2017 7:42 AM  [name]  has presented today for surgery, with the diagnosis of claudication - cad  The various methods of treatment have been discussed with the patient and family. After consideration of risks, benefits and other options for treatment, the patient has consented to  Procedure(s): Abdominal Aortogram w/Lower Extremity (N/A) and possible angioplasty as a surgical intervention .  The patient's history has been reviewed, patient examined, no change in status, stable for surgery.  I have reviewed the patient's chart and labs.  Questions were answered to the patient's satisfaction.     Adrian Prows

## 2017-05-01 NOTE — Discharge Instructions (Signed)

## 2017-05-01 NOTE — Progress Notes (Signed)
Site area: left groin Site Prior to Removal:  Level 0 Pressure Applied For: 20 minutes Manual:   yes Patient Status During Pull:  stable Post Pull Site:  Level 0 Post Pull Instructions Given:  yes Post Pull Pulses Present: dopplered Dressing Applied:  Gauze and tegaderm Bedrest begins @ 0900 Comments:

## 2017-05-02 ENCOUNTER — Encounter (HOSPITAL_COMMUNITY): Payer: Self-pay | Admitting: Cardiology

## 2017-05-23 ENCOUNTER — Encounter (INDEPENDENT_AMBULATORY_CARE_PROVIDER_SITE_OTHER): Payer: Self-pay | Admitting: Orthopaedic Surgery

## 2017-05-23 ENCOUNTER — Ambulatory Visit (INDEPENDENT_AMBULATORY_CARE_PROVIDER_SITE_OTHER): Payer: Medicare Other | Admitting: Physician Assistant

## 2017-05-23 ENCOUNTER — Ambulatory Visit (INDEPENDENT_AMBULATORY_CARE_PROVIDER_SITE_OTHER): Payer: Medicare Other

## 2017-05-23 DIAGNOSIS — M25551 Pain in right hip: Secondary | ICD-10-CM

## 2017-05-23 DIAGNOSIS — M5442 Lumbago with sciatica, left side: Secondary | ICD-10-CM | POA: Diagnosis not present

## 2017-05-23 DIAGNOSIS — M5441 Lumbago with sciatica, right side: Secondary | ICD-10-CM | POA: Diagnosis not present

## 2017-05-23 DIAGNOSIS — G8929 Other chronic pain: Secondary | ICD-10-CM

## 2017-05-23 NOTE — Progress Notes (Signed)
Office Visit Note   Patient: Nathaniel Long           Date of Birth: Feb 20, 1941           MRN: [id number] Visit Date: 05/23/2017              Requested by: [name], MD [id number] [place], [place] [address] PCP: [name], MD   Assessment & Plan: Visit Diagnoses:  1. Right hip pain   2. Chronic bilateral low back pain with bilateral sciatica     Plan: We'll obtain an MRI of his lumbar spine to evaluate the L5-S1 spondylolisthesis and for stenosis as the source of his leg pain. Have him follow up after the MRI to go over the results and discuss further treatment. Questions are encouraged and answered with patient and his wife. I encouraged him to no heavy lifting between now and his return after the MRI.  Follow-Up Instructions: Return in about 9 days (around 06/01/2017).   Orders:  Orders Placed This Encounter  Procedures  . XR Lumbar Spine 2-3 Views  . XR HIP UNILAT W OR W/O PELVIS 1V RIGHT   No orders of the defined types were placed in this encounter.     Procedures: No procedures performed   Clinical Data: No additional findings.   Subjective: Chief Complaint  Patient presents with  . Right Hip - Pain  . Lower Back - Pain    HPI [name] is a 76 year old male with right hip pain. He is also having some numbness down his right leg. He states he feels like both great toes at times are numb and sometimes his feet feel as if they are "wet". He reports that he had back surgery sometime back in the 80s no records of this are available. He also reports an MRI back in the 80s. He's had no particular new injury to his back. He does report peripheral vascular disease. He notes that he has no real back pain. He's had no bowel bladder dysfunction. He is unable to take ibuprofen due to kidney disease. His leg pain becomes so bad that he has to go to bed at times. He states that he is actually looking for electric wheelchair so that he get outside to whatever he was  working on medial to get up and perform the task at hand. Has 30-pack-year history of smoking but reports he does not smoke now. Review of Systems History bowel bladder dysfunction. We see history of present illness otherwise negative  Objective: Vital Signs: There were no vitals taken for this visit.  Physical Exam  Constitutional: He is oriented to person, place, and time. He appears well-developed and well-nourished. No distress.  Pulmonary/Chest: Effort normal.  Neurological: He is alert and oriented to person, place, and time.  Skin: He is not diaphoretic.  Psychiatric: He has a normal mood and affect. His behavior is normal.   Very hard of hearing Ortho Exam Extremities he has 5 out of 5 strength throughout lower extremities. Exquisitely tight hamstrings negative straight leg raise. Nontender over the lumbar spine and paraspinous region. Has good range of motion both hips without pain. Specialty Comments:  No specialty comments available.  Imaging: Xr Hip Unilat W Or W/o Pelvis 1v Right  Result Date: 05/23/2017 AP pelvis and lateral view of right hip: Bilateral hips well located. Hip joints well maintained. No acute fractures.  Xr Lumbar Spine 2-3 Views  Result Date: 05/23/2017 Lumbar spine 2 views: No acute  fracture. 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 Disc space otherwise well-maintained.Severe arthrosclerosis of the aorta.     PMFS History: Patient Active Problem List   Diagnosis Date Noted  . Right hip pain 05/23/2017  . Chronic bilateral low back pain with bilateral sciatica 05/23/2017  . Colon cancer screening 01/12/2017  . Antiplatelet or antithrombotic long-term use 01/12/2017  . PAD (peripheral artery disease) ([place]) 04/13/2011  . Murmur, heart 12/08/2010  . ACTINIC KERATOSIS, HEAD 07/14/2010  . BRUISED ANKLE 07/14/2010  . HYPERTENSION, BENIGN 11/19/2009  . TOBACCO ABUSE 10/28/2009  . EAR PAIN, LEFT 10/28/2009   Past Medical History:    Diagnosis Date  . Anxiety   . GERD (gastroesophageal reflux disease)   . Hypertension     Family History  Problem Relation Age of Onset  . Colon polyps Brother     Past Surgical History:  Procedure Laterality Date  . ABDOMINAL AORTOGRAM W/LOWER EXTREMITY N/A 05/01/2017   Procedure: Abdominal Aortogram w/Lower Extremity;  Surgeon: Adrian Prows, MD;  Location: [facility];  Service: Cardiovascular;  Laterality: N/A;  . BACK SURGERY  1986  . HEMORRHOID SURGERY    . left ring finger repair     Social History   Occupational History  . Not on file.   Social History Main Topics  . Smoking status: Current Every Day Smoker    Packs/day: 1.00    Types: Cigarettes  . Smokeless tobacco: Never Used  . Alcohol use No  . Drug use: No  . Sexual activity: Not on file

## 2017-06-06 ENCOUNTER — Telehealth (INDEPENDENT_AMBULATORY_CARE_PROVIDER_SITE_OTHER): Payer: Self-pay

## 2017-06-06 ENCOUNTER — Other Ambulatory Visit (INDEPENDENT_AMBULATORY_CARE_PROVIDER_SITE_OTHER): Payer: Self-pay | Admitting: Physician Assistant

## 2017-06-06 DIAGNOSIS — M4316 Spondylolisthesis, lumbar region: Secondary | ICD-10-CM

## 2017-06-06 DIAGNOSIS — G8929 Other chronic pain: Secondary | ICD-10-CM

## 2017-06-06 DIAGNOSIS — M48061 Spinal stenosis, lumbar region without neurogenic claudication: Secondary | ICD-10-CM

## 2017-06-06 DIAGNOSIS — M545 Low back pain, unspecified: Secondary | ICD-10-CM

## 2017-06-06 NOTE — Telephone Encounter (Signed)
Patient wife called concerning status of MRI

## 2017-06-11 ENCOUNTER — Ambulatory Visit (INDEPENDENT_AMBULATORY_CARE_PROVIDER_SITE_OTHER): Payer: Self-pay | Admitting: Orthopaedic Surgery

## 2017-06-15 ENCOUNTER — Ambulatory Visit
Admission: RE | Admit: 2017-06-15 | Discharge: 2017-06-15 | Disposition: A | Discharge status: Home / Self Care | Payer: Medicare Other | Source: Ambulatory Visit | Attending: Physician Assistant | Admitting: Physician Assistant

## 2017-06-15 DIAGNOSIS — M545 Low back pain, unspecified: Secondary | ICD-10-CM

## 2017-06-15 DIAGNOSIS — G8929 Other chronic pain: Secondary | ICD-10-CM

## 2017-06-15 DIAGNOSIS — M4316 Spondylolisthesis, lumbar region: Secondary | ICD-10-CM

## 2017-06-15 DIAGNOSIS — M48061 Spinal stenosis, lumbar region without neurogenic claudication: Secondary | ICD-10-CM

## 2017-06-18 ENCOUNTER — Ambulatory Visit (INDEPENDENT_AMBULATORY_CARE_PROVIDER_SITE_OTHER): Payer: Medicare Other | Admitting: Orthopaedic Surgery

## 2017-06-18 DIAGNOSIS — M4316 Spondylolisthesis, lumbar region: Secondary | ICD-10-CM | POA: Insufficient documentation

## 2017-06-18 NOTE — Progress Notes (Signed)
The patient is here for follow-up after an MRI of his lumbar spine. Plain films showed a severe spinal listhesis at L5-S1 his symptoms correlate with bilateral lower extremity pain numbness and tingling. He's also had some claudication type symptoms. He has had remote back surgery of the lumbar spine in the 1980s we have no information on that.  He still denies any change in bowel or bladder function but he definitely reports weakness in his right lower extremity and pain in the sciatic region on the right side and some on the left. He has limited flexion extension of lumbar spine secondary to stiffness and some pain. When he forward flexes he can only reach to about his mid shin on both sides. He does have some subjective numbness in the L5 distribution bilaterally but some of this may be a circulation issues well. Both legs are weak. He has problems when he first stands up taking steps in general. I've already x-rayed both hips which are normal.  MRI of his lumbar spine shows a previous L5-S1 fusion but at least a grade 2 to possibly grade 3 spondylolisthesis. This is causing severe impingement of the right L5 nerve.  At this point we need to have him evaluated by the neurosurgeons to determine whether or not he is a surgical candidate versus potentially just injections however the MRI is significant in terms of the severity of his L5-S1 disease. All questions were encouraged and answered. We'll work on making that referral.

## 2017-06-19 ENCOUNTER — Other Ambulatory Visit (INDEPENDENT_AMBULATORY_CARE_PROVIDER_SITE_OTHER): Payer: Self-pay

## 2017-06-19 DIAGNOSIS — M4316 Spondylolisthesis, lumbar region: Secondary | ICD-10-CM

## 2017-06-21 ENCOUNTER — Telehealth (INDEPENDENT_AMBULATORY_CARE_PROVIDER_SITE_OTHER): Payer: Self-pay | Admitting: Orthopaedic Surgery

## 2017-06-21 NOTE — Telephone Encounter (Signed)
Try to see if he can be seen sooner at [facility] sc[facility] with Dr. Rennis Harding.

## 2017-06-21 NOTE — Telephone Encounter (Signed)
The next possible appointment for the patient to been seen wouldn't be until November and the patient doesn't want to wait that long. Is there some where else he could be referred too?

## 2017-06-21 NOTE — Telephone Encounter (Signed)
See below

## 2017-06-22 NOTE — Telephone Encounter (Signed)
Patient aware that we are working on this

## 2017-06-22 NOTE — Telephone Encounter (Signed)
Hey can you help me with this? See below. Thanks girls!

## 2017-06-22 NOTE — Telephone Encounter (Signed)
Called Dr. Towanda Malkin office, stated next available appointments are mid October. I have faxed patient information to facility [phone] and someone from their office will contact patient.

## 2017-07-16 ENCOUNTER — Inpatient Hospital Stay (HOSPITAL_COMMUNITY)
Admission: EM | Admit: 2017-07-16 | Discharge: 2017-07-21 | DRG: 565 | Disposition: A | Discharge status: Home / Self Care | Payer: Medicare Other | Attending: General Surgery | Admitting: General Surgery

## 2017-07-16 ENCOUNTER — Inpatient Hospital Stay (HOSPITAL_COMMUNITY): Payer: Medicare Other

## 2017-07-16 ENCOUNTER — Emergency Department (HOSPITAL_COMMUNITY): Payer: Medicare Other

## 2017-07-16 DIAGNOSIS — I739 Peripheral vascular disease, unspecified: Secondary | ICD-10-CM | POA: Diagnosis present

## 2017-07-16 DIAGNOSIS — R9431 Abnormal electrocardiogram [ECG] [EKG]: Secondary | ICD-10-CM | POA: Diagnosis present

## 2017-07-16 DIAGNOSIS — I129 Hypertensive chronic kidney disease with stage 1 through stage 4 chronic kidney disease, or unspecified chronic kidney disease: Secondary | ICD-10-CM | POA: Diagnosis present

## 2017-07-16 DIAGNOSIS — S2222XA Fracture of body of sternum, initial encounter for closed fracture: Principal | ICD-10-CM | POA: Diagnosis present

## 2017-07-16 DIAGNOSIS — W228XXA Striking against or struck by other objects, initial encounter: Secondary | ICD-10-CM | POA: Diagnosis present

## 2017-07-16 DIAGNOSIS — J439 Emphysema, unspecified: Secondary | ICD-10-CM | POA: Diagnosis present

## 2017-07-16 DIAGNOSIS — Z888 Allergy status to other drugs, medicaments and biological substances status: Secondary | ICD-10-CM

## 2017-07-16 DIAGNOSIS — F419 Anxiety disorder, unspecified: Secondary | ICD-10-CM | POA: Diagnosis present

## 2017-07-16 DIAGNOSIS — Z79899 Other long term (current) drug therapy: Secondary | ICD-10-CM | POA: Diagnosis not present

## 2017-07-16 DIAGNOSIS — R079 Chest pain, unspecified: Secondary | ICD-10-CM | POA: Diagnosis present

## 2017-07-16 DIAGNOSIS — E860 Dehydration: Secondary | ICD-10-CM | POA: Diagnosis present

## 2017-07-16 DIAGNOSIS — I350 Nonrheumatic aortic (valve) stenosis: Secondary | ICD-10-CM | POA: Diagnosis present

## 2017-07-16 DIAGNOSIS — N179 Acute kidney failure, unspecified: Secondary | ICD-10-CM | POA: Diagnosis present

## 2017-07-16 DIAGNOSIS — E876 Hypokalemia: Secondary | ICD-10-CM | POA: Diagnosis not present

## 2017-07-16 DIAGNOSIS — K219 Gastro-esophageal reflux disease without esophagitis: Secondary | ICD-10-CM | POA: Diagnosis present

## 2017-07-16 DIAGNOSIS — D62 Acute posthemorrhagic anemia: Secondary | ICD-10-CM | POA: Diagnosis present

## 2017-07-16 DIAGNOSIS — Z7902 Long term (current) use of antithrombotics/antiplatelets: Secondary | ICD-10-CM | POA: Diagnosis not present

## 2017-07-16 DIAGNOSIS — J9811 Atelectasis: Secondary | ICD-10-CM | POA: Diagnosis present

## 2017-07-16 DIAGNOSIS — I712 Thoracic aortic aneurysm, without rupture: Secondary | ICD-10-CM | POA: Diagnosis present

## 2017-07-16 DIAGNOSIS — I6523 Occlusion and stenosis of bilateral carotid arteries: Secondary | ICD-10-CM | POA: Diagnosis present

## 2017-07-16 DIAGNOSIS — N183 Chronic kidney disease, stage 3 (moderate): Secondary | ICD-10-CM | POA: Diagnosis present

## 2017-07-16 DIAGNOSIS — S2243XS Multiple fractures of ribs, bilateral, sequela: Secondary | ICD-10-CM

## 2017-07-16 DIAGNOSIS — E785 Hyperlipidemia, unspecified: Secondary | ICD-10-CM | POA: Diagnosis present

## 2017-07-16 DIAGNOSIS — S2249XA Multiple fractures of ribs, unspecified side, initial encounter for closed fracture: Secondary | ICD-10-CM | POA: Diagnosis present

## 2017-07-16 DIAGNOSIS — E875 Hyperkalemia: Secondary | ICD-10-CM | POA: Diagnosis present

## 2017-07-16 DIAGNOSIS — F17210 Nicotine dependence, cigarettes, uncomplicated: Secondary | ICD-10-CM | POA: Diagnosis present

## 2017-07-16 DIAGNOSIS — S298XXA Other specified injuries of thorax, initial encounter: Secondary | ICD-10-CM

## 2017-07-16 DIAGNOSIS — W293XXA Contact with powered garden and outdoor hand tools and machinery, initial encounter: Secondary | ICD-10-CM

## 2017-07-16 DIAGNOSIS — S2243XA Multiple fractures of ribs, bilateral, initial encounter for closed fracture: Secondary | ICD-10-CM | POA: Diagnosis present

## 2017-07-16 HISTORY — DX: Adverse effect of unspecified anesthetic, initial encounter: T41.45XA

## 2017-07-16 HISTORY — DX: Low back pain, unspecified: M54.50

## 2017-07-16 HISTORY — DX: Pure hypercholesterolemia, unspecified: E78.00

## 2017-07-16 HISTORY — DX: Contact with powered garden and outdoor hand tools and machinery, initial encounter: W29.3XXA

## 2017-07-16 HISTORY — DX: Other chronic pain: G89.29

## 2017-07-16 HISTORY — DX: Other complications of anesthesia, initial encounter: T88.59XA

## 2017-07-16 HISTORY — DX: Low back pain: M54.5

## 2017-07-16 LAB — CBC WITH DIFFERENTIAL/PLATELET
BASOS ABS: 0 10*3/uL (ref 0.0–0.1)
Basophils Relative: 0 %
Eosinophils Absolute: 0 10*3/uL (ref 0.0–0.7)
Eosinophils Relative: 0 %
HEMATOCRIT: 39.6 % (ref 39.0–52.0)
HEMOGLOBIN: 13.2 g/dL (ref 13.0–17.0)
LYMPHS PCT: 9 %
Lymphs Abs: 2.3 10*3/uL (ref 0.7–4.0)
MCH: 32.1 pg (ref 26.0–34.0)
MCHC: 33.3 g/dL (ref 30.0–36.0)
MCV: 96.4 fL (ref 78.0–100.0)
MONOS PCT: 7 %
Monocytes Absolute: 1.8 10*3/uL — ABNORMAL HIGH (ref 0.1–1.0)
NEUTROS ABS: 21.3 10*3/uL — AB (ref 1.7–7.7)
Neutrophils Relative %: 84 %
Platelets: 345 10*3/uL (ref 150–400)
RBC: 4.11 MIL/uL — ABNORMAL LOW (ref 4.22–5.81)
RDW: 13.1 % (ref 11.5–15.5)
WBC: 25.4 10*3/uL — ABNORMAL HIGH (ref 4.0–10.5)

## 2017-07-16 LAB — BASIC METABOLIC PANEL
ANION GAP: 9 (ref 5–15)
BUN: 20 mg/dL (ref 6–20)
CO2: 23 mmol/L (ref 22–32)
Calcium: 9 mg/dL (ref 8.9–10.3)
Chloride: 109 mmol/L (ref 101–111)
Creatinine, Ser: 1.98 mg/dL — ABNORMAL HIGH (ref 0.61–1.24)
GFR calc Af Amer: 36 mL/min — ABNORMAL LOW (ref >60)
GFR, EST NON AFRICAN AMERICAN: 31 mL/min — AB (ref >60)
GLUCOSE: 154 mg/dL — AB (ref 65–99)
POTASSIUM: 4.5 mmol/L (ref 3.5–5.1)
Sodium: 141 mmol/L (ref 135–145)

## 2017-07-16 MED ORDER — HYDROMORPHONE HCL 1 MG/ML IJ SOLN
1.0000 mg | INTRAMUSCULAR | Status: DC | PRN
  Administered 2017-07-16 – 2017-07-17 (×3): 1 mg via INTRAVENOUS
  Filled 2017-07-16 (×3): qty 1

## 2017-07-16 MED ORDER — OXYCODONE HCL 5 MG PO TABS: 10.0000 mg | ORAL_TABLET | ORAL | Status: DC | PRN

## 2017-07-16 MED ORDER — MORPHINE SULFATE (PF) 4 MG/ML IV SOLN: 4.0000 mg | INTRAVENOUS | Status: DC | PRN

## 2017-07-16 MED ORDER — ALPRAZOLAM 0.5 MG PO TABS
0.5000 mg | ORAL_TABLET | Freq: Every day | ORAL | Status: DC
  Administered 2017-07-16 – 2017-07-20 (×5): 0.5 mg via ORAL
  Filled 2017-07-16 (×2): qty 1
  Filled 2017-07-16: qty 2
  Filled 2017-07-16 (×2): qty 1

## 2017-07-16 MED ORDER — MORPHINE SULFATE (PF) 4 MG/ML IV SOLN: 2.0000 mg | INTRAVENOUS | Status: DC | PRN

## 2017-07-16 MED ORDER — HYDROCHLOROTHIAZIDE 12.5 MG PO CAPS
12.5000 mg | ORAL_CAPSULE | Freq: Every day | ORAL | Status: DC
  Administered 2017-07-17 – 2017-07-18 (×2): 12.5 mg via ORAL
  Filled 2017-07-16 (×2): qty 1

## 2017-07-16 MED ORDER — MORPHINE SULFATE (PF) 4 MG/ML IV SOLN
4.0000 mg | Freq: Once | INTRAVENOUS | Status: AC
  Administered 2017-07-16: 4 mg via INTRAVENOUS
  Filled 2017-07-16: qty 1

## 2017-07-16 MED ORDER — FENTANYL CITRATE (PF) 100 MCG/2ML IJ SOLN
100.0000 ug | Freq: Once | INTRAMUSCULAR | Status: AC
  Administered 2017-07-16: 100 ug via INTRAVENOUS

## 2017-07-16 MED ORDER — ENOXAPARIN SODIUM 40 MG/0.4ML ~~LOC~~ SOLN
40.0000 mg | Freq: Every day | SUBCUTANEOUS | Status: DC
  Administered 2017-07-17 – 2017-07-21 (×5): 40 mg via SUBCUTANEOUS
  Filled 2017-07-16 (×5): qty 0.4

## 2017-07-16 MED ORDER — FENTANYL CITRATE (PF) 100 MCG/2ML IJ SOLN
INTRAMUSCULAR | Status: AC
  Administered 2017-07-16: 100 ug via INTRAVENOUS
  Filled 2017-07-16: qty 2

## 2017-07-16 MED ORDER — MORPHINE SULFATE (PF) 4 MG/ML IV SOLN: 1.0000 mg | INTRAVENOUS | Status: DC | PRN

## 2017-07-16 MED ORDER — LISINOPRIL 10 MG PO TABS
10.0000 mg | ORAL_TABLET | Freq: Every day | ORAL | Status: DC
  Administered 2017-07-17 – 2017-07-18 (×2): 10 mg via ORAL
  Filled 2017-07-16 (×2): qty 1

## 2017-07-16 MED ORDER — LISINOPRIL-HYDROCHLOROTHIAZIDE 10-12.5 MG PO TABS: 1.0000 | ORAL_TABLET | Freq: Every evening | ORAL | Status: DC

## 2017-07-16 MED ORDER — OXYCODONE HCL 5 MG PO TABS
5.0000 mg | ORAL_TABLET | ORAL | Status: DC | PRN
  Administered 2017-07-18 – 2017-07-19 (×3): 5 mg via ORAL
  Filled 2017-07-16 (×3): qty 1

## 2017-07-16 MED ORDER — SODIUM CHLORIDE 0.9 % IV SOLN
INTRAVENOUS | Status: DC
  Administered 2017-07-16 – 2017-07-20 (×2): via INTRAVENOUS
  Administered 2017-07-20: 50 mL via INTRAVENOUS

## 2017-07-16 MED ORDER — ONDANSETRON 4 MG PO TBDP: 4.0000 mg | ORAL_TABLET | Freq: Four times a day (QID) | ORAL | Status: DC | PRN

## 2017-07-16 MED ORDER — ONDANSETRON HCL 4 MG/2ML IJ SOLN
4.0000 mg | Freq: Four times a day (QID) | INTRAMUSCULAR | Status: DC | PRN
  Administered 2017-07-16: 4 mg via INTRAVENOUS
  Filled 2017-07-16: qty 2

## 2017-07-16 NOTE — ED Provider Notes (Addendum)
[facility] EMERGENCY DEPARTMENT Provider Note   CSN: [id number] Arrival date & time: 07/16/17  1633     History   Chief Complaint Chief Complaint  Patient presents with  . Chest Injury    HPI Nathaniel Long is a 76 y.o. male.Patient was hit in his anterior chestwith a chain saw this afternoon 2 PM. He complains of anterior pleuritic chest pain since the event. Other associated symptoms include shortness of breath. Denies abdominal pain. Pain worse with changing position or deep inspiration. Treated by EMS with fentanyl 100 g IV in the field withpartial relief HPI  Past Medical History:  Diagnosis Date  . Anxiety   . GERD (gastroesophageal reflux disease)   . Hypertension   coronary artery disease  Patient Active Problem List   Diagnosis Date Noted  . Spondylolisthesis, lumbar region 06/18/2017  . Right hip pain 05/23/2017  . Chronic bilateral low back pain with bilateral sciatica 05/23/2017  . Colon cancer screening 01/12/2017  . Antiplatelet or antithrombotic long-term use 01/12/2017  . PAD (peripheral artery disease) ([place]) 04/13/2011  . Murmur, heart 12/08/2010  . ACTINIC KERATOSIS, HEAD 07/14/2010  . BRUISED ANKLE 07/14/2010  . HYPERTENSION, BENIGN 11/19/2009  . TOBACCO ABUSE 10/28/2009  . EAR PAIN, LEFT 10/28/2009    Past Surgical History:  Procedure Laterality Date  . ABDOMINAL AORTOGRAM W/LOWER EXTREMITY N/A 05/01/2017   Procedure: Abdominal Aortogram w/Lower Extremity;  Surgeon: Adrian Prows, MD;  Location: [facility];  Service: Cardiovascular;  Laterality: N/A;  . BACK SURGERY  1986  . HEMORRHOID SURGERY    . left ring finger repair         Home Medications    Prior to Admission medications   Medication Sig Start Date End Date Taking? Authorizing Provider  ALPRAZolam Duanne Moron) 0.5 MG tablet Take 0.5 mg by mouth at bedtime.     [provider]  calcium carbonate (TUMS - DOSED IN MG ELEMENTAL CALCIUM) 500 MG chewable  tablet Chew 1 tablet by mouth daily as needed for indigestion or heartburn.    [provider]  cetirizine (ZYRTEC) 10 MG tablet TAKE 1 TABLET BY MOUTH AT BEDTIME Patient taking differently: Take 10 mg daily as needed for allergies 06/16/11   Lyndal Pulley, DO  Cholecalciferol (VITAMIN D) 2000 units CAPS Take 4,000 Units by mouth every evening.    [provider]  clopidogrel (PLAVIX) 75 MG tablet Take 75 mg by mouth every evening.     [provider]  fish oil-omega-3 fatty acids 1000 MG capsule Take 1 g by mouth every evening.     [provider]  ibuprofen (ADVIL,MOTRIN) 200 MG tablet Take 400 mg by mouth daily as needed for headache or moderate pain.    [provider]  lisinopril-hydrochlorothiazide (PRINZIDE,ZESTORETIC) 10-12.5 MG tablet Take 1 tablet by mouth every evening.     [provider]  metoprolol succinate (TOPROL-XL) 25 MG 24 hr tablet Take 25 mg by mouth every evening.     [provider]  Multiple Vitamins-Minerals (CENTRUM PO) Take 1 tablet by mouth every evening.     [provider]  pantoprazole (PROTONIX) 40 MG tablet Take 40 mg by mouth every evening.    [provider]  Pitavastatin Calcium (LIVALO) 4 MG TABS Take 2 mg by mouth every evening.     [provider]  sodium chloride (OCEAN) 0.65 % SOLN nasal spray Place 1 spray into both nostrils as needed for congestion.  [provider]    Family History Family History  Problem Relation Age of Onset  . Colon polyps Brother     Social History Social History  Substance Use Topics  . Smoking status: Current Every Day Smoker    Packs/day: 1.00    Types: Cigarettes  . Smokeless tobacco: Never Used  . Alcohol use No     Allergies   Gabapentin and Statins   Review of Systems Review of Systems  Respiratory: Positive for shortness of breath.   Cardiovascular: Positive for chest pain.  All other systems reviewed  and are negative.    Physical Exam Updated Vital Signs BP 121/64 (BP Location: Right Arm)   Pulse 63   Temp (!) 96.4 F (35.8 C) (Temporal)   Resp 19   Ht 5\' 11"  (1.803 m)   Wt 83.9 kg (185 lb)   SpO2 94%   BMI 25.80 kg/m   Physical Exam  Constitutional: He appears well-developed and well-nourished.  HENT:  Head: Normocephalic and atraumatic.  Eyes: Pupils are equal, round, and reactive to light. Conjunctivae are normal.  Neck: Neck supple. No tracheal deviation present. No thyromegaly present.  Cardiovascular: Normal rate and regular rhythm.   No murmur heard. Pulmonary/Chest: Effort normal and breath sounds normal. He exhibits tenderness.  Tender anteriorly over sternumtake or flail no ecchymosis  Abdominal: Soft. Bowel sounds are normal. He exhibits no distension. There is no tenderness.  Musculoskeletal: Normal range of motion. He exhibits no edema or tenderness.  Neurological: He is alert. Coordination normal.  Skin: Skin is warm and dry. No rash noted.  Psychiatric: He has a normal mood and affect.  Nursing note and vitals reviewed.    ED Treatments / Results  Labs (all labs ordered are listed, but only abnormal results are displayed) Labs Reviewed  BASIC METABOLIC PANEL  CBC WITH DIFFERENTIAL/PLATELET    EKG  EKG Interpretation  Date/Time:  Monday July 16 2017 16:36:42 EDT Ventricular Rate:  65 PR Interval:    QRS Duration: 101 QT Interval:  469 QTC Calculation: 488 R Axis:   46 Text Interpretation:  Sinus rhythm Ventricular premature complex Probable anteroseptal infarct, old Borderline ST depression, diffuse leads anterior ischemic changes new from previous tracing Confirmed by [place], Inocente Salles [phone]) on 07/16/2017 5:09:55 PM       Radiology Dg Chest Portable 1 View  Result Date: 07/16/2017 CLINICAL DATA:  Chest injury with pain EXAM: PORTABLE CHEST 1 VIEW COMPARISON:  None. FINDINGS: No consolidation or effusion. Patchy atelectasis at the  left lung base. Borderline to mild cardiomegaly. Aortic atherosclerosis. No pneumothorax. Acute mildly displaced right sixth and probably seventh rib fractures. IMPRESSION: 1. Acute mildly displaced right sixth rib fracture with probable adjacent seventh rib fracture. 2. Negative for pneumothorax or pleural effusion 3. Borderline to mild cardiomegaly. Electronically Signed   By: Donavan Foil M.D.   On: 07/16/2017 16:58   Chest x-ray viewed by me Results for orders placed or performed during the hospital encounter of 43/15/40  Basic metabolic panel  Result Value Ref Range   Sodium 141 135 - 145 mmol/L   Potassium 4.5 3.5 - 5.1 mmol/L   Chloride 109 101 - 111 mmol/L   CO2 23 22 - 32 mmol/L   Glucose, Bld 154 (H) 65 - 99 mg/dL   BUN 20 6 - 20 mg/dL   Creatinine, Ser 1.98 (H) 0.61 - 1.24 mg/dL   Calcium 9.0 8.9 - 10.3 mg/dL   GFR calc non Af Amer 31 (L) >  60 mL/min   GFR calc Af Amer 36 (L) >60 mL/min   Anion gap 9 5 - 15  CBC with Differential/Platelet  Result Value Ref Range   WBC 25.4 (H) 4.0 - 10.5 K/uL   RBC 4.11 (L) 4.22 - 5.81 MIL/uL   Hemoglobin 13.2 13.0 - 17.0 g/dL   HCT 39.6 39.0 - 52.0 %   MCV 96.4 78.0 - 100.0 fL   MCH 32.1 26.0 - 34.0 pg   MCHC 33.3 30.0 - 36.0 g/dL   RDW 13.1 11.5 - 15.5 %   Platelets 345 150 - 400 K/uL   Neutrophils Relative % 84 %   Lymphocytes Relative 9 %   Monocytes Relative 7 %   Eosinophils Relative 0 %   Basophils Relative 0 %   Neutro Abs 21.3 (H) 1.7 - 7.7 K/uL   Lymphs Abs 2.3 0.7 - 4.0 K/uL   Monocytes Absolute 1.8 (H) 0.1 - 1.0 K/uL   Eosinophils Absolute 0.0 0.0 - 0.7 K/uL   Basophils Absolute 0.0 0.0 - 0.1 K/uL   Smear Review MORPHOLOGY UNREMARKABLE    Dg Chest Portable 1 View  Result Date: 07/16/2017 CLINICAL DATA:  Chest injury with pain EXAM: PORTABLE CHEST 1 VIEW COMPARISON:  None. FINDINGS: No consolidation or effusion. Patchy atelectasis at the left lung base. Borderline to mild cardiomegaly. Aortic atherosclerosis. No  pneumothorax. Acute mildly displaced right sixth and probably seventh rib fractures. IMPRESSION: 1. Acute mildly displaced right sixth rib fracture with probable adjacent seventh rib fracture. 2. Negative for pneumothorax or pleural effusion 3. Borderline to mild cardiomegaly. Electronically Signed   By: Donavan Foil M.D.   On: 07/16/2017 16:58   Procedures Procedures (including critical care time)  Medications Ordered in ED Medications  fentaNYL (SUBLIMAZE) injection 100 mcg (not administered)  fentaNYL (SUBLIMAZE) 100 MCG/2ML injection (100 mcg  Given 07/16/17 1654)     Initial Impression / Assessment and Plan / ED Course  I have reviewed the triage vital signs and the nursing notes.  Pertinent labs & imaging results that were available during my care of the patient were reviewed by me and considered in my medical decision making (see chart for details).     6:35 PM patient requesting more pain medicine after treatment with intravenous fentanyl. IV morphine ordered. CT scan of the chest without contrast ordered as patient has renal insufficiency. Dr. Ninfa Linden from trauma surgery consulted by me and here at 6:35 PM to evaluate patient  Final Clinical Impressions(s) / ED Diagnoses  Diagnosis #1 blunt chest trauma with multiple rib fractures Final diagnoses:  None   #2 renal insufficiency New Prescriptions New Prescriptions   No medications on file  CRITICAL CARE Performed by: Orlie Dakin Total critical care time: 30 minutes Critical care time was exclusive of separately billable procedures and treating other patients. Critical care was necessary to treat or prevent imminent or life-threatening deterioration. Critical care was time spent personally by me on the following activities: development of treatment plan with patient and/or surrogate as well as nursing, discussions with consultants, evaluation of patient's response to treatment, examination of patient, obtaining history  from patient or surrogate, ordering and performing treatments and interventions, ordering and review of laboratory studies, ordering and review of radiographic studies, pulse oximetry and re-evaluation of patient's condition.   Orlie Dakin, MD 07/16/17 [place], [place], MD 07/16/17 [phone]

## 2017-07-16 NOTE — H&P (Signed)
History   Nathaniel Long is an 76 y.o. male.   Chief Complaint:  Chief Complaint  Patient presents with  . Chest Injury    HPI This gentleman presents as a non-trauma activation.  He was cutting trees the chainsaw when the chainsaw kicked back and the blunt end hit him in the chest.He was brought by EMS to the hospital complaining of chest pain and mild shortness of breath.  He did have some mild decreased o2 sats but responded to oxygen.   A chest xray showed right 6th and possible 7th rib fracture without pneumothorax.  EKG showed bordeline ST changes.  He is otherwise without complaints. There was no LOC.  Past Medical History:  Diagnosis Date  . Anxiety   . GERD (gastroesophageal reflux disease)   . Hypertension     Past Surgical History:  Procedure Laterality Date  . ABDOMINAL AORTOGRAM W/LOWER EXTREMITY N/A 05/01/2017   Procedure: Abdominal Aortogram w/Lower Extremity;  Surgeon: Adrian Prows, MD;  Location: [facility];  Service: Cardiovascular;  Laterality: N/A;  . BACK SURGERY  1986  . HEMORRHOID SURGERY    . left ring finger repair      Family History  Problem Relation Age of Onset  . Colon polyps Brother    Social History:  reports that he has been smoking Cigarettes.  He has been smoking about 1.00 pack per day. He has never used smokeless tobacco. He reports that he does not drink alcohol or use drugs.  Allergies   Allergies  Allergen Reactions  . Gabapentin     dizziness  . Statins     Muscle weakness    Home Medications   (Not in a hospital admission)  Trauma Course   Results for orders placed or performed during the hospital encounter of 07/16/17 (from the past 48 hour(s))  Basic metabolic panel     Status: Abnormal   Collection Time: 07/16/17  5:15 PM  Result Value Ref Range   Sodium 141 135 - 145 mmol/L   Potassium 4.5 3.5 - 5.1 mmol/L   Chloride 109 101 - 111 mmol/L   CO2 23 22 - 32 mmol/L   Glucose, Bld 154 (H) 65 - 99 mg/dL   BUN 20  6 - 20 mg/dL   Creatinine, Ser 1.98 (H) 0.61 - 1.24 mg/dL   Calcium 9.0 8.9 - 10.3 mg/dL   GFR calc non Af Amer 31 (L) >60 mL/min   GFR calc Af Amer 36 (L) >60 mL/min    Comment: (NOTE) The eGFR has been calculated using the CKD EPI equation. This calculation has not been validated in all clinical situations. eGFR's persistently <60 mL/min signify possible Chronic Kidney Disease.    Anion gap 9 5 - 15  CBC with Differential/Platelet     Status: Abnormal   Collection Time: 07/16/17  5:15 PM  Result Value Ref Range   WBC 25.4 (H) 4.0 - 10.5 K/uL   RBC 4.11 (L) 4.22 - 5.81 MIL/uL   Hemoglobin 13.2 13.0 - 17.0 g/dL   HCT 39.6 39.0 - 52.0 %   MCV 96.4 78.0 - 100.0 fL   MCH 32.1 26.0 - 34.0 pg   MCHC 33.3 30.0 - 36.0 g/dL   RDW 13.1 11.5 - 15.5 %   Platelets 345 150 - 400 K/uL   Neutrophils Relative % 84 %   Lymphocytes Relative 9 %   Monocytes Relative 7 %   Eosinophils Relative 0 %   Basophils Relative 0 %  Neutro Abs 21.3 (H) 1.7 - 7.7 K/uL   Lymphs Abs 2.3 0.7 - 4.0 K/uL   Monocytes Absolute 1.8 (H) 0.1 - 1.0 K/uL   Eosinophils Absolute 0.0 0.0 - 0.7 K/uL   Basophils Absolute 0.0 0.0 - 0.1 K/uL   Smear Review MORPHOLOGY UNREMARKABLE    Dg Chest Portable 1 View  Result Date: 07/16/2017 CLINICAL DATA:  Chest injury with pain EXAM: PORTABLE CHEST 1 VIEW COMPARISON:  None. FINDINGS: No consolidation or effusion. Patchy atelectasis at the left lung base. Borderline to mild cardiomegaly. Aortic atherosclerosis. No pneumothorax. Acute mildly displaced right sixth and probably seventh rib fractures. IMPRESSION: 1. Acute mildly displaced right sixth rib fracture with probable adjacent seventh rib fracture. 2. Negative for pneumothorax or pleural effusion 3. Borderline to mild cardiomegaly. Electronically Signed   By: Donavan Foil M.D.   On: 07/16/2017 16:58    Review of Systems  All other systems reviewed and are negative.   Blood pressure 132/67, pulse 63, temperature (!) 97.1  F (36.2 C), temperature source Temporal, resp. rate (!) 23, height '5\' 11"'  (1.803 m), weight 83.9 kg (185 lb), SpO2 96 %. Physical Exam  Constitutional: He is oriented to person, place, and time. He appears well-developed and well-nourished. He appears distressed.  HENT:  Head: Normocephalic and atraumatic.  Right Ear: External ear normal.  Left Ear: External ear normal.  Mouth/Throat: No oropharyngeal exudate.  Hard of hearing  Eyes: Pupils are equal, round, and reactive to light. Right eye exhibits no discharge. Left eye exhibits no discharge. No scleral icterus.  Neck: Normal range of motion. Neck supple. No tracheal deviation present.  Cardiovascular: Normal rate, regular rhythm and normal heart sounds.   No murmur heard. Respiratory: Effort normal and breath sounds normal. No respiratory distress. He exhibits tenderness.  GI: Soft.  Musculoskeletal: Normal range of motion. He exhibits no edema or tenderness.  Lymphadenopathy:    He has no cervical adenopathy.  Neurological: He is alert and oriented to person, place, and time.  Skin: Skin is warm and dry. No erythema.  Psychiatric: His behavior is normal. Judgment normal.     Assessment/Plan Multiple rib fractures after blunt trauma to the chest  The plan will be to admit the patient to the stepdown unit for continuous cardiac monitoring to evaluate for blunt cardiac injury.  We will repeat an EKG in the morning and also check a chest CT now to evaluate the chest further.  Ashur Glatfelter A 07/16/2017, 7:10 PM   Procedures

## 2017-07-16 NOTE — Progress Notes (Signed)
Patient ID: Nathaniel Long, male   DOB: 1941/05/23, 76 y.o.   MRN: [id number]   CT scan of the chest shows a sternal fracture with bilateral anterior fifth and sixth rib fractures as well as an anterior mediastinal hematoma. There is no pneumothorax. There is a possibility of a lung mass and CT PET is been recommended as an outpatient.  I have not made the patient or the family aware of this as of yet.

## 2017-07-16 NOTE — ED Triage Notes (Signed)
Pt was cutting trees at 2:00pm and the saw kicked back onto him, hitting him with his hands and the safety bar of the saw.

## 2017-07-16 NOTE — ED Notes (Signed)
Nathaniel Long (wife) [phone] home* [phone] cell  Nathaniel Long (son) [phone]

## 2017-07-16 NOTE — ED Notes (Signed)
Fentanyl given at 16:54 prior to order entered by physician

## 2017-07-16 NOTE — ED Notes (Signed)
100 fentanyl given by EMS

## 2017-07-17 ENCOUNTER — Encounter (HOSPITAL_COMMUNITY): Payer: Self-pay | Admitting: *Deleted

## 2017-07-17 LAB — BASIC METABOLIC PANEL
ANION GAP: 8 (ref 5–15)
ANION GAP: 8 (ref 5–15)
BUN: 27 mg/dL — ABNORMAL HIGH (ref 6–20)
BUN: 29 mg/dL — AB (ref 6–20)
CALCIUM: 8.5 mg/dL — AB (ref 8.9–10.3)
CHLORIDE: 107 mmol/L (ref 101–111)
CO2: 24 mmol/L (ref 22–32)
CO2: 23 mmol/L (ref 22–32)
Calcium: 8.4 mg/dL — ABNORMAL LOW (ref 8.9–10.3)
Chloride: 105 mmol/L (ref 101–111)
Creatinine, Ser: 2.12 mg/dL — ABNORMAL HIGH (ref 0.61–1.24)
Creatinine, Ser: 2.17 mg/dL — ABNORMAL HIGH (ref 0.61–1.24)
GFR calc Af Amer: 32 mL/min — ABNORMAL LOW (ref >60)
GFR calc non Af Amer: 29 mL/min — ABNORMAL LOW (ref >60)
GFR calc non Af Amer: 28 mL/min — ABNORMAL LOW (ref >60)
GFR, EST AFRICAN AMERICAN: 33 mL/min — AB (ref >60)
GLUCOSE: 149 mg/dL — AB (ref 65–99)
Glucose, Bld: 151 mg/dL — ABNORMAL HIGH (ref 65–99)
POTASSIUM: 6.4 mmol/L — AB (ref 3.5–5.1)
Potassium: 4.6 mmol/L (ref 3.5–5.1)
Sodium: 139 mmol/L (ref 135–145)
Sodium: 136 mmol/L (ref 135–145)

## 2017-07-17 LAB — MRSA PCR SCREENING: MRSA by PCR: NEGATIVE

## 2017-07-17 LAB — TROPONIN I
TROPONIN I: 0.07 ng/mL — AB (ref <0.03)
TROPONIN I: 0.09 ng/mL — AB (ref <0.03)
TROPONIN I: 0.07 ng/mL — AB (ref <0.03)

## 2017-07-17 LAB — CBC
HEMATOCRIT: 37.9 % — AB (ref 39.0–52.0)
HEMOGLOBIN: 12.6 g/dL — AB (ref 13.0–17.0)
MCH: 32.5 pg (ref 26.0–34.0)
MCHC: 33.2 g/dL (ref 30.0–36.0)
MCV: 97.7 fL (ref 78.0–100.0)
Platelets: 331 10*3/uL (ref 150–400)
RBC: 3.88 MIL/uL — AB (ref 4.22–5.81)
RDW: 13.6 % (ref 11.5–15.5)
WBC: 11.5 10*3/uL — AB (ref 4.0–10.5)

## 2017-07-17 MED ORDER — DOCUSATE SODIUM 100 MG PO CAPS
100.0000 mg | ORAL_CAPSULE | Freq: Every day | ORAL | Status: DC
  Administered 2017-07-18 – 2017-07-21 (×4): 100 mg via ORAL
  Filled 2017-07-17 (×4): qty 1

## 2017-07-17 MED ORDER — SODIUM CHLORIDE 0.9 % IV SOLN
1.0000 g | Freq: Once | INTRAVENOUS | Status: AC
  Administered 2017-07-17: 1 g via INTRAVENOUS
  Filled 2017-07-17: qty 10

## 2017-07-17 MED ORDER — TRAMADOL HCL 50 MG PO TABS
50.0000 mg | ORAL_TABLET | Freq: Four times a day (QID) | ORAL | Status: DC
  Administered 2017-07-17 – 2017-07-19 (×8): 50 mg via ORAL
  Filled 2017-07-17 (×9): qty 1

## 2017-07-17 MED ORDER — ORAL CARE MOUTH RINSE
15.0000 mL | Freq: Two times a day (BID) | OROMUCOSAL | Status: DC
  Administered 2017-07-17 – 2017-07-21 (×8): 15 mL via OROMUCOSAL

## 2017-07-17 MED ORDER — PSYLLIUM 95 % PO PACK
1.0000 | PACK | Freq: Every day | ORAL | Status: DC
  Administered 2017-07-17 – 2017-07-19 (×3): 1 via ORAL
  Filled 2017-07-17 (×5): qty 1

## 2017-07-17 MED ORDER — FUROSEMIDE 10 MG/ML IJ SOLN
20.0000 mg | Freq: Once | INTRAMUSCULAR | Status: AC
  Administered 2017-07-17: 20 mg via INTRAVENOUS
  Filled 2017-07-17: qty 2

## 2017-07-17 MED ORDER — OXYCODONE HCL 5 MG PO TABS
10.0000 mg | ORAL_TABLET | Freq: Four times a day (QID) | ORAL | Status: DC
  Administered 2017-07-17: 10 mg via ORAL
  Filled 2017-07-17: qty 2

## 2017-07-17 MED ORDER — SODIUM POLYSTYRENE SULFONATE 15 GM/60ML PO SUSP
45.0000 g | Freq: Once | ORAL | Status: AC
  Administered 2017-07-17: 45 g via ORAL
  Filled 2017-07-17: qty 180

## 2017-07-17 NOTE — ED Notes (Signed)
Voicemail left on spouse & son phone about room assignment

## 2017-07-17 NOTE — Consult Note (Addendum)
Reason for Consult:Cardiac contusion Referring Physician: Elfego Giammarino is an 76 y.o. male.  HPI: Patient with asymptomatic bilateral carotid artery stenosis, peripheral arterial disease involving small vessels, hypertension, hyperlipidemia, continued tobacco use disorder, has stable stage III chronic kidney disease, mild aortic stenosis and mild hyperlipidemia. Had been doing well until yesterday he went to chain saw a tree that had followed on the highway to help traffic, however the chainsaw broke off and hit his chest and now admitted with blunt chest wall trauma with fractured rib and also sternal fracture. I was consulted to evaluate for cardiac contusion.  Patient has severe chest discomfort. Unable to cough. He denies any resting dyspnea although he has mild chronic dyspnea. No dizziness or syncope, his wife and son are present at the bedside.  Past Medical History:  Diagnosis Date  . Anxiety   . GERD (gastroesophageal reflux disease)   . Hypertension     Past Surgical History:  Procedure Laterality Date  . ABDOMINAL AORTOGRAM W/LOWER EXTREMITY N/A 05/01/2017   Procedure: Abdominal Aortogram w/Lower Extremity;  Surgeon: Adrian Prows, MD;  Location: [facility];  Service: Cardiovascular;  Laterality: N/A;  . BACK SURGERY  1986  . HEMORRHOID SURGERY    . left ring finger repair      Family History  Problem Relation Age of Onset  . Colon polyps Brother     Social History:  reports that he has been smoking Cigarettes.  He has been smoking about 1.00 pack per day. He has never used smokeless tobacco. He reports that he does not drink alcohol or use drugs.  Allergies:  Allergies  Allergen Reactions  . Gabapentin     dizziness  . Statins     Muscle weakness    Medications:  Prior to Admission:  Facility-Administered Medications Prior to Admission  Medication Dose Route Frequency Provider Last Rate Last Dose  . 0.9 %  sodium chloride infusion  500 mL  Intravenous Continuous Ladene Artist, MD       Prescriptions Prior to Admission  Medication Sig Dispense Refill Last Dose  . ALPRAZolam (XANAX) 0.5 MG tablet Take 0.5 mg by mouth at bedtime.    07/15/2017 at Unknown time  . calcium carbonate (TUMS - DOSED IN MG ELEMENTAL CALCIUM) 500 MG chewable tablet Chew 1 tablet by mouth daily as needed for indigestion or heartburn.   unknown at PRN  . cetirizine (ZYRTEC) 10 MG tablet TAKE 1 TABLET BY MOUTH AT BEDTIME (Patient taking differently: Take 10 mg daily as needed for allergies) 31 tablet 3 unknown at PRN  . Cholecalciferol (VITAMIN D) 2000 units CAPS Take 4,000 Units by mouth every evening.   07/15/2017 at Unknown time  . clopidogrel (PLAVIX) 75 MG tablet Take 75 mg by mouth every evening.    07/15/2017 at Unknown time  . fish oil-omega-3 fatty acids 1000 MG capsule Take 1 g by mouth every evening.    07/15/2017 at Unknown time  . ibuprofen (ADVIL,MOTRIN) 200 MG tablet Take 400 mg by mouth daily as needed for headache or moderate pain.   unknown at prn  . lisinopril-hydrochlorothiazide (PRINZIDE,ZESTORETIC) 10-12.5 MG tablet Take 1 tablet by mouth every evening.    07/15/2017 at Unknown time  . metoprolol succinate (TOPROL-XL) 25 MG 24 hr tablet Take 25 mg by mouth every evening.    07/15/2017 at 2200  . Multiple Vitamins-Minerals (CENTRUM PO) Take 1 tablet by mouth every evening.    07/15/2017 at Unknown time  .  pantoprazole (PROTONIX) 40 MG tablet Take 40 mg by mouth every evening.   07/15/2017 at Unknown time  . Pitavastatin Calcium (LIVALO) 4 MG TABS Take 2 mg by mouth every evening.    07/15/2017 at Unknown time  . sodium chloride (OCEAN) 0.65 % SOLN nasal spray Place 1 spray into both nostrils as needed for congestion.   unknown at prn   Scheduled: . ALPRAZolam  0.5 mg Oral QHS  . [START ON 07/18/2017] docusate sodium  100 mg Oral Daily  . enoxaparin (LOVENOX) injection  40 mg Subcutaneous Daily  . lisinopril  10 mg Oral Daily   And  .  hydrochlorothiazide  12.5 mg Oral Daily  . oxyCODONE  10 mg Oral QID  . psyllium  1 packet Oral QHS  . traMADol  50 mg Oral Q6H    Results for orders placed or performed during the hospital encounter of 07/16/17 (from the past 48 hour(s))  Basic metabolic panel     Status: Abnormal   Collection Time: 07/16/17  5:15 PM  Result Value Ref Range   Sodium 141 135 - 145 mmol/L   Potassium 4.5 3.5 - 5.1 mmol/L   Chloride 109 101 - 111 mmol/L   CO2 23 22 - 32 mmol/L   Glucose, Bld 154 (H) 65 - 99 mg/dL   BUN 20 6 - 20 mg/dL   Creatinine, Ser 1.98 (H) 0.61 - 1.24 mg/dL   Calcium 9.0 8.9 - 10.3 mg/dL   GFR calc non Af Amer 31 (L) >60 mL/min   GFR calc Af Amer 36 (L) >60 mL/min    Comment: (NOTE) The eGFR has been calculated using the CKD EPI equation. This calculation has not been validated in all clinical situations. eGFR's persistently <60 mL/min signify possible Chronic Kidney Disease.    Anion gap 9 5 - 15  CBC with Differential/Platelet     Status: Abnormal   Collection Time: 07/16/17  5:15 PM  Result Value Ref Range   WBC 25.4 (H) 4.0 - 10.5 K/uL   RBC 4.11 (L) 4.22 - 5.81 MIL/uL   Hemoglobin 13.2 13.0 - 17.0 g/dL   HCT 39.6 39.0 - 52.0 %   MCV 96.4 78.0 - 100.0 fL   MCH 32.1 26.0 - 34.0 pg   MCHC 33.3 30.0 - 36.0 g/dL   RDW 13.1 11.5 - 15.5 %   Platelets 345 150 - 400 K/uL   Neutrophils Relative % 84 %   Lymphocytes Relative 9 %   Monocytes Relative 7 %   Eosinophils Relative 0 %   Basophils Relative 0 %   Neutro Abs 21.3 (H) 1.7 - 7.7 K/uL   Lymphs Abs 2.3 0.7 - 4.0 K/uL   Monocytes Absolute 1.8 (H) 0.1 - 1.0 K/uL   Eosinophils Absolute 0.0 0.0 - 0.7 K/uL   Basophils Absolute 0.0 0.0 - 0.1 K/uL   Smear Review MORPHOLOGY UNREMARKABLE   MRSA PCR Screening     Status: None   Collection Time: 07/17/17  2:12 AM  Result Value Ref Range   MRSA by PCR NEGATIVE NEGATIVE    Comment:        The GeneXpert MRSA Assay (FDA approved for NASAL specimens only), is one component  of a comprehensive MRSA colonization surveillance program. It is not intended to diagnose MRSA infection nor to guide or monitor treatment for MRSA infections.   CBC     Status: Abnormal   Collection Time: 07/17/17  3:47 AM  Result Value Ref Range  WBC 11.5 (H) 4.0 - 10.5 K/uL   RBC 3.88 (L) 4.22 - 5.81 MIL/uL   Hemoglobin 12.6 (L) 13.0 - 17.0 g/dL   HCT 37.9 (L) 39.0 - 52.0 %   MCV 97.7 78.0 - 100.0 fL   MCH 32.5 26.0 - 34.0 pg   MCHC 33.2 30.0 - 36.0 g/dL   RDW 13.6 11.5 - 15.5 %   Platelets 331 150 - 400 K/uL  Basic metabolic panel     Status: Abnormal   Collection Time: 07/17/17  3:47 AM  Result Value Ref Range   Sodium 139 135 - 145 mmol/L   Potassium 6.4 (HH) 3.5 - 5.1 mmol/L    Comment: DELTA CHECK NOTED NO VISIBLE HEMOLYSIS CRITICAL RESULT CALLED TO, READ BACK BY AND VERIFIED WITH: NIELSON T,RN 07/17/17 0455 WAYK    Chloride 107 101 - 111 mmol/L   CO2 24 22 - 32 mmol/L   Glucose, Bld 151 (H) 65 - 99 mg/dL   BUN 27 (H) 6 - 20 mg/dL   Creatinine, Ser 2.12 (H) 0.61 - 1.24 mg/dL   Calcium 8.4 (L) 8.9 - 10.3 mg/dL   GFR calc non Af Amer 29 (L) >60 mL/min   GFR calc Af Amer 33 (L) >60 mL/min    Comment: (NOTE) The eGFR has been calculated using the CKD EPI equation. This calculation has not been validated in all clinical situations. eGFR's persistently <60 mL/min signify possible Chronic Kidney Disease.    Anion gap 8 5 - 15  Troponin I (q 6hr x 3)     Status: Abnormal   Collection Time: 07/17/17  8:09 AM  Result Value Ref Range   Troponin I 0.07 (HH) <0.03 ng/mL    Comment: CRITICAL RESULT CALLED TO, READ BACK BY AND VERIFIED WITH: M.WOOD,RN 07/17/17 0933 BY BSLADE   Troponin I (q 6hr x 3)     Status: Abnormal   Collection Time: 07/17/17 12:55 PM  Result Value Ref Range   Troponin I 0.09 (HH) <0.03 ng/mL    Comment: CRITICAL VALUE NOTED.  VALUE IS CONSISTENT WITH PREVIOUSLY REPORTED AND CALLED VALUE.  Basic metabolic panel     Status: Abnormal    Collection Time: 07/17/17 12:55 PM  Result Value Ref Range   Sodium 136 135 - 145 mmol/L   Potassium 4.6 3.5 - 5.1 mmol/L   Chloride 105 101 - 111 mmol/L   CO2 23 22 - 32 mmol/L   Glucose, Bld 149 (H) 65 - 99 mg/dL   BUN 29 (H) 6 - 20 mg/dL   Creatinine, Ser 2.17 (H) 0.61 - 1.24 mg/dL   Calcium 8.5 (L) 8.9 - 10.3 mg/dL   GFR calc non Af Amer 28 (L) >60 mL/min   GFR calc Af Amer 32 (L) >60 mL/min    Comment: (NOTE) The eGFR has been calculated using the CKD EPI equation. This calculation has not been validated in all clinical situations. eGFR's persistently <60 mL/min signify possible Chronic Kidney Disease.    Anion gap 8 5 - 15    Review of Systems  Constitutional: Negative for chills, fever, malaise/fatigue and weight loss.  HENT: Positive for hearing loss (Chronic). Negative for ear pain, nosebleeds and tinnitus.   Eyes: Negative for blurred vision, double vision and photophobia.  Respiratory: Positive for cough, sputum production and shortness of breath. Negative for wheezing.   Cardiovascular: Positive for chest pain and claudication. Negative for orthopnea, leg swelling and PND.  Gastrointestinal: Negative for abdominal pain, heartburn, nausea and vomiting.  Genitourinary: Negative for dysuria and urgency.  Musculoskeletal: Positive for back pain. Negative for falls, joint pain, myalgias and neck pain.  Skin: Negative for itching and rash.  Neurological: Negative.   Endo/Heme/Allergies: Does not bruise/bleed easily.  Psychiatric/Behavioral: Negative.   All other systems reviewed and are negative.  Blood pressure 124/78, pulse 68, temperature (!) 97.3 F (36.3 C), temperature source Oral, resp. rate (!) 23, height '5\' 11"'  (1.803 m), weight 83.9 kg (185 lb), SpO2 94 %. Physical Exam  Constitutional: He is oriented to person, place, and time. He appears well-developed and well-nourished.  HENT:  Head: Atraumatic.  Eyes: Conjunctivae are normal.  Neck: Normal range of  motion. Neck supple.  Cardiovascular: Normal rate and regular rhythm.   Distant heart sounds. No murmur or gallop appreciated.  Respiratory:  Reduced effort due to pleuritic pain. Diffuse liver the course crackles in the anterior chest wall bilaterally. Reduced breath sounds at the bases posteriorly.  GI: Soft. Bowel sounds are normal.  Musculoskeletal: Normal range of motion.  Neurological: He is alert and oriented to person, place, and time.  Skin: Skin is warm and dry. He is not diaphoretic.   EKG 07/16/2017: Normal sinus rhythm at the rate of 63 beats per min, normal axis. Nonspecific T abnormality. No evidence of ischemia.  CT angiogram chest:  1. Negative for pneumothorax or hemothorax. 2. Comminuted, slightly displaced fracture involving the upper sternal body with extension to the sternal manubrial articulation. Additional minimally displaced fractures involving the mid sternal body. Moderate hematoma within the anterior mediastinum, presumably related to the sternal fractures. Multiple right greater than left bilateral anterior rib fractures with segmental right fifth and sixth rib fractures. 3. Moderate emphysema. 16 mm spiculated left upper lobe lung mass, morphology of which is suspicious for lung carcinoma. Recommend correlation with PET-CT when clinically feasible. 4. 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 Recommend annual imaging followup by CTA or MRA. This recommendation follows 2010 ACCF/AHA/AATS/ACR/ASA/SCA/SCAI/SIR/STS/SVM Guidelines for the Diagnosis and Management of Patients with Thoracic Aortic Disease. Circulation. 2010; 121: J628-B151 Aortic Atherosclerosis (ICD10-I70.0) and Emphysema (ICD10-J43.9). Electronically Signed   By: Donavan Foil M.D.   On: 07/16/2017 19:41   Assessment/Plan: 1. Chest pain clearly musculoskeletal. 2. Mediastinal hematoma 3. Hypertension, controlled 4. Peripheral arterial disease involving small vessels below the knee  bilaterally. 5. Abnormal CT scan of the chest suggestive of left upper lobe 37m spiculated mass suggestive of malignancy. Ongoing tobacco use disorder. 6. Hyperlipidemia  Recommendation: Mildly elevated serum troponin may be related to cardiac contusion however she has not had any significant arrhythmias and EKG was essentially unremarkable. On the telemetry he does have mild nonspecific ST-T abnormality. I'll obtain an echocardiogram to exclude pericardial effusion although CT scan of the chest did not reveal any cardiac enlargement and also to evaluate for wall motion abnormality. From cardiac standpoint he is otherwise stable if the echocardiogram is within normal limits. He will need further evaluation for lung mass.  I have ordered for Percocet every 6 hours due to significant pain and inability to clear his oropharyngeal and tracheal mucus. Also started him on Metamucil and Colace, abdomen been in a distended and probably will be constipated as you go forward. Please do not hesitate to contact me if any questions. With regard to hyperlipidemia, she has not been able to tolerate any other medication except live alone however it is fine to hold off on starting this while in-house.  JAdrian Prows MD 07/17/2017, 3:46 PM [facility] [place] Pager: [phone] Office: [phone] If no answer: Cell:  [phone]

## 2017-07-17 NOTE — Care Management Note (Addendum)
Case Management Note  Patient Details  Name: HAMZAH SAVOCA MRN: [id number] Date of Birth: [birth year]  Subjective/Objective:   Pt admitted on 07/16/17 with blunt trauma to the chest from a chainsaw injury.  He sustained sternal fracture with bilateral anterior fifth and sixth rib fractures as well as an anterior mediastinal hematoma.  PTA, pt independent, lives with spouse.                   Action/Plan: Will follow for discharge needs as pt progresses.  Wife states she is able to provide care at discharge.    Expected Discharge Date:                  Expected Discharge Plan:  Home/Self Care  In-House Referral:  Clinical Social Work  Discharge planning Services  CM Consult  Post Acute Care Choice:    Choice offered to:     DME Arranged:    DME Agency:     HH Arranged:    HH Agency:     Status of Service:  In process, will continue to follow  If discussed at Long Length of Stay Meetings, dates discussed:    Additional Comments:  Ella Bodo, RN 07/17/2017, 11:00 AM

## 2017-07-17 NOTE — Plan of Care (Signed)
Problem: Education: Goal: Knowledge of [facility] Education information/materials will improve Outcome: Progressing Unable to determine pt complete level of understanding due to pt inability to hear everything being said.  Problem: Safety: Goal: Ability to remain free from injury will improve Outcome: Progressing Encouraged to cough and deep breath to prevent pneumonia  Problem: Activity: Goal: Risk for activity intolerance will decrease Outcome: Progressing Got up to [facility] this pm

## 2017-07-17 NOTE — Progress Notes (Signed)
Subjective: Anterior chest/rib pain but better than last night  Objective: Vital signs in last 24 hours: Temp:  [96.4 F (35.8 C)-97.7 F (36.5 C)] 97.7 F (36.5 C) (10/16 0757) Pulse Rate:  [60-65] 62 (10/16 0757) Resp:  [11-23] 17 (10/16 0757) BP: (109-146)/(53-68) 124/55 (10/16 0757) SpO2:  [90 %-96 %] 96 % (10/16 0757) Weight:  [83.9 kg (185 lb)] 83.9 kg (185 lb) (10/15 1645)    Intake/Output from previous day: 10/15 0701 - 10/16 0700 In: -  Out: 100 [Urine:100] Intake/Output this shift: No intake/output data recorded.  General appearance: alert and cooperative Resp: clear to auscultation bilaterally and bony crepitance Chest wall: anterior tenderness Cardio: regular rate and rhythm GI: soft, NT, ND  Neuro: very HOH, F/C, speech fluent  Lab Results: CBC   Recent Labs  07/16/17 1715 07/17/17 0347  WBC 25.4* 11.5*  HGB 13.2 12.6*  HCT 39.6 37.9*  PLT 345 331   BMET  Recent Labs  07/16/17 1715 07/17/17 0347  NA 141 139  K 4.5 6.4*  CL 109 107  CO2 23 24  GLUCOSE 154* 151*  BUN 20 27*  CREATININE 1.98* 2.12*  CALCIUM 9.0 8.4*   PT/INR No results for input(s): LABPROT, INR in the last 72 hours. ABG No results for input(s): PHART, HCO3 in the last 72 hours.  Invalid input(s): PCO2, PO2  Studies/Results: Ct Chest Wo Contrast  Result Date: 07/16/2017 CLINICAL DATA:  Rib fracture after blunt trauma to the chest EXAM: CT CHEST WITHOUT CONTRAST TECHNIQUE: Multidetector CT imaging of the chest was performed following the standard protocol without IV contrast. COMPARISON:  Radiograph 07/16/2017 FINDINGS: Cardiovascular: Limited evaluation without intravenous contrast. 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 Atherosclerotic calcifications. Coronary artery calcification. Heart size upper normal. No large pericardial effusion. Mediastinum/Nodes: Edema and hemorrhage within the anterior aspect of the mediastinum, deep to the  sternum. No thyroid mass. Midline trachea. No significantly enlarged lymph nodes. Esophagus within normal limits. Lungs/Pleura: Patchy bilateral lower lobe posterior consolidation may reflect atelectasis, contusion, or aspiration. Negative for pneumothorax or pleural effusion. Moderate emphysema. Spiculated left upper lobe lung mass measuring 16 x 13 mm, series 4, image number 31. Upper Abdomen: No acute abnormality. Mild nodularity of the left adrenal gland without dominant mass. Musculoskeletal: Mildly comminuted fracture involving the upper sternal body with extension to the sternal manubrial articulation. 24 mm posteriorly displaced bone fragment at the site of fracture. Additional mildly comminuted and displaced fracture involving the mid sternal body , with extension to the sternal costal junction bilaterally. Acute, mildly displaced right anterior third and fourth rib fractures, segmental right fifth rib fracture anteriorly and antero laterally, segmental right sixth rib fracture anteriorly and antero laterally. Displaced right seventh rib fracture anteriorly. Acute left fifth and sixth rib fracture anteriorly at the costochondral junction, acute mildly displaced left seventh and eighth rib fractures. IMPRESSION: 1. Negative for pneumothorax or hemothorax. 2. Comminuted, slightly displaced fracture involving the upper sternal body with extension to the sternal manubrial articulation. Additional minimally displaced fractures involving the mid sternal body. Moderate hematoma within the anterior mediastinum, presumably related to the sternal fractures. Multiple right greater than left bilateral anterior rib fractures with segmental right fifth and sixth rib fractures. 3. Moderate emphysema. 16 mm spiculated left upper lobe lung mass, morphology of which is suspicious for lung carcinoma. Recommend correlation with PET-CT when clinically feasible. 4. 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  Recommend annual imaging followup  by CTA or MRA. This recommendation follows 2010 ACCF/AHA/AATS/ACR/ASA/SCA/SCAI/SIR/STS/SVM Guidelines for the Diagnosis and Management of Patients with Thoracic Aortic Disease. Circulation. 2010; 121: P710-G269 Aortic Atherosclerosis (ICD10-I70.0) and Emphysema (ICD10-J43.9). Electronically Signed   By: Donavan Foil M.D.   On: 07/16/2017 19:41   Dg Chest Portable 1 View  Result Date: 07/16/2017 CLINICAL DATA:  Chest injury with pain EXAM: PORTABLE CHEST 1 VIEW COMPARISON:  None. FINDINGS: No consolidation or effusion. Patchy atelectasis at the left lung base. Borderline to mild cardiomegaly. Aortic atherosclerosis. No pneumothorax. Acute mildly displaced right sixth and probably seventh rib fractures. IMPRESSION: 1. Acute mildly displaced right sixth rib fracture with probable adjacent seventh rib fracture. 2. Negative for pneumothorax or pleural effusion 3. Borderline to mild cardiomegaly. Electronically Signed   By: Donavan Foil M.D.   On: 07/16/2017 16:58    Anti-infectives: Anti-infectives    None      Assessment/Plan: Blunt anterior chest trauma from chainsaw Sternal FX and B anterior rib FX - pulm toilet, multimodal pain management, did just under 750cc on IS for me Possible cardial contusion - 12 lead, troponin, I have consulted his cardiologist Dr. 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 I told him about this. VTE - Lovenox FEN - adv diet, hyperkalemia - lasix/kayexalate/Ca and F/U Dispo - SDU, PT/OT   LOS: 1 day    Georganna Skeans, MD, MPH, FACS Trauma: [phone] General Surgery: [phone]  10/16/2018Patient ID: Nathaniel Long, male   DOB: 12-Jul-1941, 76 y.o.   MRN: [id number]

## 2017-07-17 NOTE — Progress Notes (Signed)
CRITICAL VALUE ALERT  Critical Value:  K+ 6.4 - RN Informed Lilia Pro  Date & Time Notied:  4034 07/17/17  Provider Notified: Trauma MD   Orders Received/Actions taken: paged through [facility]

## 2017-07-18 ENCOUNTER — Inpatient Hospital Stay (HOSPITAL_COMMUNITY): Payer: Medicare Other

## 2017-07-18 ENCOUNTER — Encounter (HOSPITAL_COMMUNITY): Payer: Self-pay | Admitting: General Practice

## 2017-07-18 ENCOUNTER — Other Ambulatory Visit (HOSPITAL_COMMUNITY): Payer: Self-pay

## 2017-07-18 LAB — BASIC METABOLIC PANEL
Anion gap: 9 (ref 5–15)
BUN: 35 mg/dL — ABNORMAL HIGH (ref 6–20)
CHLORIDE: 102 mmol/L (ref 101–111)
CO2: 22 mmol/L (ref 22–32)
CREATININE: 2.22 mg/dL — AB (ref 0.61–1.24)
Calcium: 8 mg/dL — ABNORMAL LOW (ref 8.9–10.3)
GFR calc non Af Amer: 27 mL/min — ABNORMAL LOW (ref >60)
GFR, EST AFRICAN AMERICAN: 32 mL/min — AB (ref >60)
Glucose, Bld: 134 mg/dL — ABNORMAL HIGH (ref 65–99)
POTASSIUM: 4 mmol/L (ref 3.5–5.1)
SODIUM: 133 mmol/L — AB (ref 135–145)

## 2017-07-18 NOTE — Progress Notes (Signed)
  Echocardiogram 2D Echocardiogram has been performed.  Nathaniel Long 07/18/2017, 9:22 AM

## 2017-07-18 NOTE — Progress Notes (Signed)
[facility] Progress Note     Subjective: CC:  Patient feels gas pains are exacerbating chest wall pain but the gas has improved compared to yesterday. Tolerating PO without nausea/vomiting. +flatus. No BM.  Mobilized well with OT this AM and per OT they do not plan to follow him because he is performing so well.   Afebrile VSS, O2 Sats 96% ORA. Pulling ~500 on IS.  Objective: Vital signs in last 24 hours: Temp:  [97.3 F (36.3 C)-98.1 F (36.7 C)] 98.1 F (36.7 C) (10/17 0756) Pulse Rate:  [60-131] 60 (10/17 0900) Resp:  [9-24] 24 (10/17 0900) BP: (86-142)/(56-123) 114/60 (10/17 0900) SpO2:  [91 %-100 %] 96 % (10/17 0900) ORA  Last BM Date:  (PTA)  Intake/Output from previous day: 10/16 0701 - 10/17 0700 In: 402.7 [I.V.:402.7] Out: 700 [Urine:700] Intake/Output this shift: Total I/O In: 20 [I.V.:20] Out: -   PE: Gen:  Alert, NAD, pleasant Card:  Regular rate and rhythm, pedal pulses 2+ BL Pulm:  Mild tenderness anterior chest wall, normal resp effort, some rhonchi over anterior and superior lung fields bilaterally, no wheezes or rhales.  Abd: Soft, non-tender, non-distended, bowel sounds present Skin: warm and dry, no rashes  Psych: A&Ox3   Lab Results:   Recent Labs  07/16/17 1715 07/17/17 0347  WBC 25.4* 11.5*  HGB 13.2 12.6*  HCT 39.6 37.9*  PLT 345 331   BMET  Recent Labs  07/17/17 1255 07/18/17 0317  NA 136 133*  K 4.6 4.0  CL 105 102  CO2 23 22  GLUCOSE 149* 134*  BUN 29* 35*  CREATININE 2.17* 2.22*  CALCIUM 8.5* 8.0*   PT/INR No results for input(s): LABPROT, INR in the last 72 hours. CMP     Component Value Date/Time   NA 133 (L) 07/18/2017 0317   K 4.0 07/18/2017 0317   CL 102 07/18/2017 0317   CO2 22 07/18/2017 0317   GLUCOSE 134 (H) 07/18/2017 0317   BUN 35 (H) 07/18/2017 0317   CREATININE 2.22 (H) 07/18/2017 0317   CREATININE 1.17 02/21/2012 1046   CALCIUM 8.0 (L) 07/18/2017 0317   PROT 7.1 02/21/2012 1046    ALBUMIN 4.6 02/21/2012 1046   AST 19 02/21/2012 1046   ALT 15 02/21/2012 1046   ALKPHOS 73 02/21/2012 1046   BILITOT 0.9 02/21/2012 1046   GFRNONAA 27 (L) 07/18/2017 0317   GFRAA 32 (L) 07/18/2017 0317   Lipase  No results found for: LIPASE     Studies/Results: Ct Chest Wo Contrast  Result Date: 07/16/2017 CLINICAL DATA:  Rib fracture after blunt trauma to the chest EXAM: CT CHEST WITHOUT CONTRAST TECHNIQUE: Multidetector CT imaging of the chest was performed following the standard protocol without IV contrast. COMPARISON:  Radiograph 07/16/2017 FINDINGS: Cardiovascular: Limited evaluation without intravenous contrast. 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 Atherosclerotic calcifications. Coronary artery calcification. Heart size upper normal. No large pericardial effusion. Mediastinum/Nodes: Edema and hemorrhage within the anterior aspect of the mediastinum, deep to the sternum. No thyroid mass. Midline trachea. No significantly enlarged lymph nodes. Esophagus within normal limits. Lungs/Pleura: Patchy bilateral lower lobe posterior consolidation may reflect atelectasis, contusion, or aspiration. Negative for pneumothorax or pleural effusion. Moderate emphysema. Spiculated left upper lobe lung mass measuring 16 x 13 mm, series 4, image number 31. Upper Abdomen: No acute abnormality. Mild nodularity of the left adrenal gland without dominant mass. Musculoskeletal: Mildly comminuted fracture involving the upper sternal body with extension to the  sternal manubrial articulation. 24 mm posteriorly displaced bone fragment at the site of fracture. Additional mildly comminuted and displaced fracture involving the mid sternal body , with extension to the sternal costal junction bilaterally. Acute, mildly displaced right anterior third and fourth rib fractures, segmental right fifth rib fracture anteriorly and antero laterally, segmental right sixth rib fracture anteriorly  and antero laterally. Displaced right seventh rib fracture anteriorly. Acute left fifth and sixth rib fracture anteriorly at the costochondral junction, acute mildly displaced left seventh and eighth rib fractures. IMPRESSION: 1. Negative for pneumothorax or hemothorax. 2. Comminuted, slightly displaced fracture involving the upper sternal body with extension to the sternal manubrial articulation. Additional minimally displaced fractures involving the mid sternal body. Moderate hematoma within the anterior mediastinum, presumably related to the sternal fractures. Multiple right greater than left bilateral anterior rib fractures with segmental right fifth and sixth rib fractures. 3. Moderate emphysema. 16 mm spiculated left upper lobe lung mass, morphology of which is suspicious for lung carcinoma. Recommend correlation with PET-CT when clinically feasible. 4. 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 Recommend annual imaging followup by CTA or MRA. This recommendation follows 2010 ACCF/AHA/AATS/ACR/ASA/SCA/SCAI/SIR/STS/SVM Guidelines for the Diagnosis and Management of Patients with Thoracic Aortic Disease. Circulation. 2010; 121: U132-G401 Aortic Atherosclerosis (ICD10-I70.0) and Emphysema (ICD10-J43.9). Electronically Signed   By: Donavan Foil M.D.   On: 07/16/2017 19:41   Dg Chest Port 1 View  Result Date: 07/18/2017 CLINICAL DATA:  Multiple rib fractures EXAM: PORTABLE CHEST 1 VIEW COMPARISON:  07/16/2017 FINDINGS: Bibasilar atelectasis, left greater than right. No pneumothorax or effusions. Left upper lobe nodule difficult to see, better seen on prior CT. IMPRESSION: Bibasilar atelectasis, left greater than right. Left upper lobe nodule. Electronically Signed   By: Rolm Baptise M.D.   On: 07/18/2017 08:17   Dg Chest Portable 1 View  Result Date: 07/16/2017 CLINICAL DATA:  Chest injury with pain EXAM: PORTABLE CHEST 1 VIEW COMPARISON:  None. FINDINGS: No consolidation or  effusion. Patchy atelectasis at the left lung base. Borderline to mild cardiomegaly. Aortic atherosclerosis. No pneumothorax. Acute mildly displaced right sixth and probably seventh rib fractures. IMPRESSION: 1. Acute mildly displaced right sixth rib fracture with probable adjacent seventh rib fracture. 2. Negative for pneumothorax or pleural effusion 3. Borderline to mild cardiomegaly. Electronically Signed   By: Donavan Foil M.D.   On: 07/16/2017 16:58    Anti-infectives: Anti-infectives    None     Assessment/Plan Blunt anterior chest trauma from chainsaw Sternal FX and B anterior rib FX - pulm toilet, multimodal pain management, 500cc on IS for me Possible cardial contusion - Appreciate consult by Dr. Einar Gip - no significant EKG results, mildly elevated troponin without sxs of ACS. Echo performed today and awaiting final read. If echo normal then no further cardiac workup necessary.  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 Dr. Grandville Silos told patient about this. I spoke with patients wife about mass today. She states that patient doesn't have a pulmonologist but his PCP is Dr. Fabio Pierce.  VTE - Lovenox, SCD's  FEN - adv diet, hyperkalemia resolved (4.0)  Dispo - PT eval pending, transfer to the floor Continue telemetry for now  Will discuss timing of re-initiation of plavix with MD, anticipate patient will be stable for discharge in 24-48 h    LOS: 2 days    Jill Alexanders , [facility] 07/18/2017, 10:02 AM Pager: [phone] Consults: [phone]  Mon-Fri 7:00 am-4:30 pm Sat-Sun 7:00 am-11:30 am

## 2017-07-18 NOTE — Evaluation (Signed)
Physical Therapy Evaluation Patient Details Name: Nathaniel Long MRN: [id number] DOB: 1940/12/29 Today's Date: 07/18/2017   History of Present Illness  Pt admitted on 07/16/17 with blunt trauma to the chest from a chainsaw injury (dropped on chest).  He sustained sternal fracture with bilateral anterior fifth and sixth rib fractures as well as an anterior mediastinal hematoma.  Pt has a past medical history including  Anxiety; GERD; Hypertension; Back surgery (1986); Hemorrhoid surgery; left ring finger repair; and ABDOMINAL AORTOGRAM W/LOWER EXTREMITY (05/01/2017). PTA, pt independent, lives with spouse.  Clinical Impression  Pt admitted with above diagnosis. Pt currently with functional limitations due to the deficits listed below (see PT Problem List). On eval, pt required min assist bed mobility, min guard assist transfers, and min guard assist ambulation 150 feet without AD.  Pt will benefit from skilled PT to increase their independence and safety with mobility to allow discharge to the venue listed below.  PT to follow acutely. No follow up services indicated.    Follow Up Recommendations No PT follow up;Supervision/Assistance - 24 hour    Equipment Recommendations  None recommended by PT    Recommendations for Other Services       Precautions / Restrictions Precautions Precautions: None Restrictions Weight Bearing Restrictions: No      Mobility  Bed Mobility Overal bed mobility: Needs Assistance Bed Mobility: Supine to Sit     Supine to sit: Min assist     General bed mobility comments: +rail, increased time and effort, assist needed due to pain with pulling/pushing BUE  Transfers Overall transfer level: Needs assistance Equipment used: None Transfers: Sit to/from [employer] Transfers Sit to Stand: Min guard Stand pivot transfers: Min guard       General transfer comment: min guard for safety, no physical assist  Ambulation/Gait Ambulation/Gait  assistance: Min guard Ambulation Distance (Feet): 150 Feet Assistive device: None Gait Pattern/deviations: Step-through pattern;Drifts right/left Gait velocity: decreased Gait velocity interpretation: Below normal speed for age/gender General Gait Details: mildly unsteady, pt able to self correct. Pt ambulated on RA with desat to 90%. Upon return to recliner in room, sats 94% at rest on RA.  Stairs            Wheelchair Mobility    Modified Rankin (Stroke Patients Only)       Balance Overall balance assessment: Needs assistance Sitting-balance support: No upper extremity supported;Feet supported Sitting balance-Leahy Scale: Good     Standing balance support: No upper extremity supported;During functional activity Standing balance-Leahy Scale: Fair                               Pertinent Vitals/Pain Pain Assessment: Faces Faces Pain Scale: Hurts little more Pain Location: chest Pain Descriptors / Indicators: Grimacing;Guarding Pain Intervention(s): Monitored during session;Repositioned    Home Living Family/patient expects to be discharged to:: Private residence Living Arrangements: Spouse/significant other;Children Available Help at Discharge: Family;Available 24 hours/day Type of Home: House Home Access: Stairs to enter     Home Layout: Two level Home Equipment: None      Prior Function Level of Independence: Independent               Hand Dominance        Extremity/Trunk Assessment   Upper Extremity Assessment Upper Extremity Assessment: Defer to OT evaluation    Lower Extremity Assessment Lower Extremity Assessment: Overall WFL for tasks assessed    Cervical / Trunk Assessment  Cervical / Trunk Assessment: Normal  Communication   Communication: HOH  Cognition Arousal/Alertness: Awake/alert Behavior During Therapy: WFL for tasks assessed/performed Overall Cognitive Status: Within Functional Limits for tasks assessed                                         General Comments      Exercises     Assessment/Plan    PT Assessment Patient needs continued PT services  PT Problem List Decreased activity tolerance;Decreased mobility;Pain;Cardiopulmonary status limiting activity;Decreased balance       PT Treatment Interventions Gait training;Stair training;Functional mobility training;Balance training;Therapeutic exercise;Therapeutic activities;Patient/family education    PT Goals (Current goals can be found in the Care Plan section)  Acute Rehab PT Goals Patient Stated Goal: home PT Goal Formulation: With patient Time For Goal Achievement: 07/25/17 Potential to Achieve Goals: Good    Frequency Min 3X/week   Barriers to discharge        Co-evaluation               AM-PAC PT "6 Clicks" Daily Activity  Outcome Measure Difficulty turning over in bed (including adjusting bedclothes, sheets and blankets)?: A Little Difficulty moving from lying on back to sitting on the side of the bed? : Unable Difficulty sitting down on and standing up from a chair with arms (e.g., wheelchair, bedside commode, etc,.)?: A Little Help needed moving to and from a bed to chair (including a wheelchair)?: None Help needed walking in hospital room?: A Little Help needed climbing 3-5 steps with a railing? : A Little 6 Click Score: 17    End of Session Equipment Utilized During Treatment: Gait belt Activity Tolerance: Patient tolerated treatment well Patient left: in chair;with chair alarm set;with call bell/phone within reach Nurse Communication: Mobility status PT Visit Diagnosis: Difficulty in walking, not elsewhere classified (R26.2);Pain    Time: 9924-2683 PT Time Calculation (min) (ACUTE ONLY): 26 min   Charges:   PT Evaluation $PT Eval Moderate Complexity: 1 Mod     PT G Codes:        Lorrin Goodell, PT  Office # [phone] Pager [phone]   Lorriane Shire 07/18/2017, 10:18  AM

## 2017-07-18 NOTE — Evaluation (Signed)
Occupational Therapy Evaluation and Discharge Patient Details Name: Nathaniel Long MRN: [id number] DOB: 05-Feb-1941 Today's Date: 07/18/2017    History of Present Illness Pt admitted on 07/16/17 with blunt trauma to the chest from a chainsaw injury (dropped on chest).  He sustained sternal fracture with bilateral anterior fifth and sixth rib fractures as well as an anterior mediastinal hematoma.  Pt has a past medical history including  Anxiety; GERD; Hypertension; Back surgery (1986); Hemorrhoid surgery; left ring finger repair; and ABDOMINAL AORTOGRAM W/LOWER EXTREMITY (05/01/2017). PTA, pt independent, lives with spouse.   Clinical Impression   PTA Pt independent in ADL and mobility with the exception of min A for LB dressing from his wife. Pt is currently very close to baseline. Slight deficits due to pain from his injury and "gas" requiring min A anticipate that Pt will make good progress and he will have help from his wife for ADLs at home. Pt VERY HOH and so both loud talking and written communication was used this session. No questions or concerns for OT from Pt. OT to sign off as education is complete, and he is close to baseline. Thank you for the opportunity to serve this patient.     Follow Up Recommendations  No OT follow up;Supervision - Intermittent    Equipment Recommendations  3 in 1 bedside commode    Recommendations for Other Services       Precautions / Restrictions Precautions Precautions: None Restrictions Weight Bearing Restrictions: No      Mobility Bed Mobility Overal bed mobility: Needs Assistance Bed Mobility: Supine to Sit     Supine to sit: Min assist     General bed mobility comments: +rail, increased time and effort, assist needed due to pain with pulling/pushing BUE  Transfers Overall transfer level: Needs assistance Equipment used: None Transfers: Sit to/from [employer] Transfers Sit to Stand: Min guard Stand pivot transfers: Min  guard       General transfer comment: min guard for safety, no physical assist    Balance Overall balance assessment: Needs assistance Sitting-balance support: No upper extremity supported;Feet supported Sitting balance-Leahy Scale: Good     Standing balance support: No upper extremity supported;During functional activity Standing balance-Leahy Scale: Fair                             ADL either performed or assessed with clinical judgement   ADL Overall ADL's : Needs assistance/impaired Eating/Feeding: Modified independent;Sitting Eating/Feeding Details (indicate cue type and reason): in recliner at [employer] Grooming: Wash/dry hands;Wash/dry face;Set up;Sitting Grooming Details (indicate cue type and reason): in recliner Upper Body Bathing: Set up;Sitting   Lower Body Bathing: Minimal assistance;With caregiver independent assisting   Upper Body Dressing : Minimal assistance   Lower Body Dressing: Moderate assistance   Toilet Transfer: Magazine features editor Details (indicate cue type and reason): simulated through recliner transfer in room Toileting- Clothing Manipulation and Hygiene: Min guard;Minimal assistance       Functional mobility during ADLs: Min guard General ADL Comments: Pt with decreased independence mainly due to pain, believe that gait is baseline (PT agrees)  as Pt stated "My hip is off and it makes my back hurt" I believe that he is close to baseline for ADL "I couldn't put on my own socks before this happened"     Vision Baseline Vision/History: Wears glasses Wears Glasses: At all times Patient Visual Report: No change from baseline  Perception     Praxis      Pertinent Vitals/Pain Pain Assessment: Faces Faces Pain Scale: Hurts little more Pain Location: chest Pain Descriptors / Indicators: Grimacing;Guarding Pain Intervention(s): Monitored during session;Repositioned     Hand Dominance     Extremity/Trunk Assessment Upper  Extremity Assessment Upper Extremity Assessment: Overall WFL for tasks assessed   Lower Extremity Assessment Lower Extremity Assessment: Defer to PT evaluation   Cervical / Trunk Assessment Cervical / Trunk Assessment: Normal   Communication Communication Communication: HOH (asked son to bring batteries for his hearing aide)   Cognition Arousal/Alertness: Awake/alert Behavior During Therapy: WFL for tasks assessed/performed Overall Cognitive Status: Within Functional Limits for tasks assessed                                     General Comments  Pt is VERY HOH. Spoke with son Merry Proud who called and he will bring batteries for hearing aide at 6pm when he comes    Exercises     Shoulder Instructions      Home Living Family/patient expects to be discharged to:: Private residence Living Arrangements: Spouse/significant other;Children Available Help at Discharge: Family;Available 24 hours/day Type of Home: House Home Access: Stairs to enter [employer] of Steps: 3 Entrance Stairs-Rails: Right Home Layout: Two level Alternate Level Stairs-Number of Steps: flight Alternate Level Stairs-Rails: Right Bathroom Shower/Tub: Teacher, early years/pre: Standard     Home Equipment: None          Prior Functioning/Environment Level of Independence: Independent                 OT Problem List: Decreased activity tolerance;Impaired balance (sitting and/or standing);Pain      OT Treatment/Interventions:      OT Goals(Current goals can be found in the care plan section) Acute Rehab OT Goals Patient Stated Goal: to get home OT Goal Formulation: With patient Time For Goal Achievement: 08/01/17 Potential to Achieve Goals: Good  OT Frequency:     Barriers to D/C:            Co-evaluation PT/OT/SLP Co-Evaluation/Treatment: Yes Reason for Co-Treatment: For patient/therapist safety;To address functional/ADL transfers PT goals addressed  during session: Mobility/safety with mobility OT goals addressed during session: ADL's and self-care      AM-PAC PT "6 Clicks" Daily Activity     Outcome Measure Help from another person eating meals?: None Help from another person taking care of personal grooming?: None Help from another person toileting, which includes using toliet, bedpan, or urinal?: A Little Help from another person bathing (including washing, rinsing, drying)?: A Little Help from another person to put on and taking off regular upper body clothing?: None Help from another person to put on and taking off regular lower body clothing?: A Little 6 Click Score: 21   End of Session Equipment Utilized During Treatment: Gait belt Nurse Communication: Mobility status;Other (comment) (O2 off - Pt above 90)  Activity Tolerance: Patient tolerated treatment well Patient left: in chair;with call bell/phone within reach;with chair alarm set  OT Visit Diagnosis: Unsteadiness on feet (R26.81);Pain Pain - Right/Left: Right Pain - part of body: Shoulder (chest)                Time: 6314-9702 OT Time Calculation (min): 39 min Charges:  OT General Charges $OT Visit: 1 Visit OT Evaluation $OT Eval Mod Complexity: 1 Mod OT Treatments $Self Care/Home  Management : 8-22 mins G-Codes:     Hulda Humphrey OTR/L [place] 07/18/2017, 11:49 AM

## 2017-07-18 NOTE — Progress Notes (Signed)
Report called to [facility] RN. Patient room being cleaned at the [facility]

## 2017-07-19 LAB — ECHOCARDIOGRAM COMPLETE
AOPV: 0.47 m/s
AOVTI: 41 cm
AV Area VTI index: 1.17 cm2/m2
AV Area VTI: 2.13 cm2
AV Mean grad: 10 mmHg
AV peak Index: 1.04
AV pk vel: 238 cm/s
AV vel: 2.39
AVAREAMEANV: 2.05 cm2
AVAREAMEANVIN: 1 cm2/m2
AVCELMEANRAT: 0.45
AVPG: 23 mmHg
CHL CUP AV VALUE AREA INDEX: 1.17
DOP CAL AO MEAN VELOCITY: 144 cm/s
E decel time: 310 msec
FS: 34 % (ref 28–44)
Height: 71 in
IVS/LV PW RATIO, ED: 0.91
LA ID, A-P, ES: 35 mm
LA diam end sys: 35 mm
LADIAMINDEX: 1.72 cm/m2
LAVOL: 47.2 mL
LAVOLA4C: 49.6 mL
LAVOLIN: 23.1 mL/m2
LDCA: 4.52 cm2
LV PW d: 11 mm — AB (ref 0.6–1.1)
LV TDI E'LATERAL: 6.74
LV TDI E'MEDIAL: 6.74
LV e' LATERAL: 6.74 cm/s
LVOT VTI: 21.7 cm
LVOT diameter: 24 mm
LVOT peak VTI: 0.53 cm
LVOT peak grad rest: 5 mmHg
LVOTPV: 112 cm/s
LVOTSV: 98 mL
MV Dec: 310
MV pk E vel: 0.6 m/s
RV LATERAL S' VELOCITY: 20.5 cm/s
RV TAPSE: 24.4 mm
Valve area: 2.39 cm2
Weight: 2960 oz

## 2017-07-19 LAB — BASIC METABOLIC PANEL
ANION GAP: 10 (ref 5–15)
BUN: 41 mg/dL — ABNORMAL HIGH (ref 6–20)
CALCIUM: 7.9 mg/dL — AB (ref 8.9–10.3)
CO2: 25 mmol/L (ref 22–32)
Chloride: 98 mmol/L — ABNORMAL LOW (ref 101–111)
Creatinine, Ser: 2.05 mg/dL — ABNORMAL HIGH (ref 0.61–1.24)
GFR calc Af Amer: 35 mL/min — ABNORMAL LOW (ref >60)
GFR calc non Af Amer: 30 mL/min — ABNORMAL LOW (ref >60)
GLUCOSE: 115 mg/dL — AB (ref 65–99)
Potassium: 3.8 mmol/L (ref 3.5–5.1)
Sodium: 133 mmol/L — ABNORMAL LOW (ref 135–145)

## 2017-07-19 LAB — CBC
HCT: 31 % — ABNORMAL LOW (ref 39.0–52.0)
Hemoglobin: 10.2 g/dL — ABNORMAL LOW (ref 13.0–17.0)
MCH: 31.6 pg (ref 26.0–34.0)
MCHC: 32.9 g/dL (ref 30.0–36.0)
MCV: 96 fL (ref 78.0–100.0)
Platelets: 249 10*3/uL (ref 150–400)
RBC: 3.23 MIL/uL — ABNORMAL LOW (ref 4.22–5.81)
RDW: 13.2 % (ref 11.5–15.5)
WBC: 11.1 10*3/uL — AB (ref 4.0–10.5)

## 2017-07-19 MED ORDER — ACETAMINOPHEN 325 MG PO TABS
650.0000 mg | ORAL_TABLET | Freq: Four times a day (QID) | ORAL | Status: DC
  Administered 2017-07-19 – 2017-07-21 (×9): 650 mg via ORAL
  Filled 2017-07-19 (×9): qty 2

## 2017-07-19 MED ORDER — TRAMADOL HCL 50 MG PO TABS
75.0000 mg | ORAL_TABLET | Freq: Four times a day (QID) | ORAL | Status: DC
  Administered 2017-07-19 – 2017-07-21 (×9): 75 mg via ORAL
  Filled 2017-07-19 (×9): qty 2

## 2017-07-19 NOTE — Progress Notes (Signed)
Subjective:  Still has chest pain and difficult to cough.  Objective:  Blood pressure 118/64, pulse (!) 59, temperature 97.9 F (36.6 C), temperature source Oral, resp. rate 17, height 5\' 11"  (1.803 m), weight 84.2 kg (185 lb 10 oz), SpO2 93 %.  Physical Exam:  Neck: Normal range of motion. Neck supple.  Cardiovascular: Normal rate and regular rhythm.   Distant heart sounds. No murmur or gallop appreciated.  Respiratory: Reduced effort due to pleuritic pain. Diffuse bilateral  course crackles in the anterior chest wall bilaterally. Reduced breath sounds at the bases posteriorly.  GI: Soft. Bowel sounds are normal.  Musculoskeletal: Normal range of motion.  Neurological: He is alert and oriented to person, place, and time.   Lab Results: BMP  Recent Labs  07/17/17 1255 07/18/17 0317 07/19/17 0552  NA 136 133* 133*  K 4.6 4.0 3.8  CL 105 102 98*  CO2 23 22 25   GLUCOSE 149* 134* 115*  BUN 29* 35* 41*  CREATININE 2.17* 2.22* 2.05*  CALCIUM 8.5* 8.0* 7.9*  GFRNONAA 28* 27* 30*  GFRAA 32* 32* 35*    CBC  Recent Labs Lab 07/16/17 1715  07/19/17 0552  WBC 25.4*  < > 11.1*  RBC 4.11*  < > 3.23*  HGB 13.2  < > 10.2*  HCT 39.6  < > 31.0*  PLT 345  < > 249  MCV 96.4  < > 96.0  MCH 32.1  < > 31.6  MCHC 33.3  < > 32.9  RDW 13.1  < > 13.2  LYMPHSABS 2.3  --   --   MONOABS 1.8*  --   --   EOSABS 0.0  --   --   BASOSABS 0.0  --   --   < > = values in this interval not displayed.  HEMOGLOBIN A1C Lab Results  Component Value Date   HGBA1C 6.0 02/21/2012    Cardiac Panel (last 3 results)  Recent Labs  07/17/17 0809 07/17/17 1255 07/17/17 2046  TROPONINI 0.07* 0.09* 0.07*    Lipid Panel     Component Value Date/Time   CHOL 129 02/21/2012 1046   TRIG 129 02/21/2012 1046   HDL 34 (L) 02/21/2012 1046   CHOLHDL 3.8 02/21/2012 1046   VLDL 26 02/21/2012 1046   LDLCALC 69 02/21/2012 1046     EKG 07/16/2017: Normal sinus rhythm at the rate of 63 beats per min,  normal axis. Nonspecific T abnormality. No evidence of ischemia.  CT angiogram chest:  1. Negative for pneumothorax or hemothorax. 2. Comminuted, slightly displaced fracture involving the upper sternal body with extension to the sternal manubrial articulation. Additional minimally displaced fractures involving the mid sternal body. Moderate hematoma within the anterior mediastinum, presumably related to the sternal fractures. Multiple right greater than left bilateral anterior rib fractures with segmental right fifth and sixth rib fractures. 3. Moderate emphysema. 16 mm spiculated left upper lobe lung mass, morphology of which is suspicious for lung carcinoma. Recommend correlation with PET-CT when clinically feasible. 4. 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 Recommend annual imaging followup by CTA or MRA. This recommendation follows 2010 ACCF/AHA/AATS/ACR/ASA/SCA/SCAI/SIR/STS/SVM Guidelines for the Diagnosis and Management of Patients with Thoracic Aortic Disease. Circulation. 2010; 121: N397-Q734 Aortic Atherosclerosis (ICD10-I70.0) and Emphysema (ICD10-J43.9). Electronically Signed   By: Donavan Foil M.D.   On: 07/16/2017 19:41   ECHO: 07/18/2017: Normal LVEF, no wall motion abnormality, mild diastolic dysfunction.Trace aortic stenosis. No pericardial effusion.   Scheduled Meds: .  ALPRAZolam  0.5 mg Oral QHS  . docusate sodium  100 mg Oral Daily  . enoxaparin (LOVENOX) injection  40 mg Subcutaneous Daily  . mouth rinse  15 mL Mouth Rinse BID  . psyllium  1 packet Oral QHS  . traMADol  50 mg Oral Q6H   Continuous Infusions: . sodium chloride 10 mL/hr at 07/17/17 0949   PRN Meds:.HYDROmorphone (DILAUDID) injection, morphine injection, morphine injection, morphine injection, ondansetron **OR** ondansetron (ZOFRAN) IV, oxyCODONE   Assessment/Plan:  1. Blunt chest wall trauma with minimal mediastinitis secondary to hematoma and sternal and rib fracture 2. PAD hold  Plavix for now. Can start asa 81 mg daily 3. Hypertension controlled  4. 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 5. Secondary troponin leak probably non specific. 6. Mediastinitis  Rec: Stable for discharge from cardiac standpoint. S. Cr decreasing already. Agree with holding Lisinopril for now and we can f/u BMP in the out patient basis.  No suspicion for cardiac contusion. S. Trop flat. Will add Amlodipine 5 mg for hypertension if needed but on the outpatient basis. Need hypertension BP for now in view of acute renal failure. Discharge on pain meds around the clock to improve cough especially in view of COPD to decrease atelectasis and pneumonia. He can f/u with Dr. [name] in 10 days for hypertension and renal failure with repeat BMP.  Highly recommend to supply him some equipment for suctioning his sections if possible. Has very poor cough effort due to pain.   Adrian Prows, M.D. 07/19/2017, 8:58 AM [facility] Pager: [phone] Office: [phone] If no answer: [phone]

## 2017-07-19 NOTE — Discharge Instructions (Signed)

## 2017-07-19 NOTE — Progress Notes (Signed)
[facility] Progress Note     Subjective: CC:  Sitting up in bed on computer in no acute distress. Reports some anterior chest wall pain that is improved with pain medications. Tolerating PO. Urinating. +flatus. Mobilizing. Reports that he feels ready to go home whenever we feel he is medically stable.   Objective: Vital signs in last 24 hours: Temp:  [97.8 F (36.6 C)-98.4 F (36.9 C)] 97.9 F (36.6 C) (10/18 0526) Pulse Rate:  [59-65] 59 (10/18 0526) Resp:  [12-24] 17 (10/18 0526) BP: (94-137)/(53-68) 118/64 (10/18 0526) SpO2:  [89 %-98 %] 93 % (10/18 0526) Weight:  [84.2 kg (185 lb 10 oz)] 84.2 kg (185 lb 10 oz) (10/17 1649) Last BM Date: 07/16/17  Intake/Output from previous day: 10/17 0701 - 10/18 0700 In: 280 [P.O.:240; I.V.:40] Out: 400 [Urine:400] Intake/Output this shift: Total I/O In: -  Out: 100 [Urine:100]  PE: Gen:  Alert, NAD, pleasant Card:  Regular rate and rhythm, pedal pulses 2+ BL Pulm:  Mild tenderness anterior chest wall, normal resp effort, some rhonchi over anterior and superior lung fields bilaterally, no wheezes or rhales.  Abd: Soft, non-tender, non-distended, bowel sounds present Skin: warm and dry, no rashes  Psych: A&Ox3     Lab Results:   Recent Labs  07/17/17 0347 07/19/17 0552  WBC 11.5* 11.1*  HGB 12.6* 10.2*  HCT 37.9* 31.0*  PLT 331 249   BMET  Recent Labs  07/18/17 0317 07/19/17 0552  NA 133* 133*  K 4.0 3.8  CL 102 98*  CO2 22 25  GLUCOSE 134* 115*  BUN 35* 41*  CREATININE 2.22* 2.05*  CALCIUM 8.0* 7.9*   PT/INR No results for input(s): LABPROT, INR in the last 72 hours. CMP     Component Value Date/Time   NA 133 (L) 07/19/2017 0552   K 3.8 07/19/2017 0552   CL 98 (L) 07/19/2017 0552   CO2 25 07/19/2017 0552   GLUCOSE 115 (H) 07/19/2017 0552   BUN 41 (H) 07/19/2017 0552   CREATININE 2.05 (H) 07/19/2017 0552   CREATININE 1.17 02/21/2012 1046   CALCIUM 7.9 (L) 07/19/2017 0552   PROT 7.1  02/21/2012 1046   ALBUMIN 4.6 02/21/2012 1046   AST 19 02/21/2012 1046   ALT 15 02/21/2012 1046   ALKPHOS 73 02/21/2012 1046   BILITOT 0.9 02/21/2012 1046   GFRNONAA 30 (L) 07/19/2017 0552   GFRAA 35 (L) 07/19/2017 0552   Lipase  No results found for: LIPASE   Studies/Results: Dg Chest Port 1 View  Result Date: 07/18/2017 CLINICAL DATA:  Multiple rib fractures EXAM: PORTABLE CHEST 1 VIEW COMPARISON:  07/16/2017 FINDINGS: Bibasilar atelectasis, left greater than right. No pneumothorax or effusions. Left upper lobe nodule difficult to see, better seen on prior CT. IMPRESSION: Bibasilar atelectasis, left greater than right. Left upper lobe nodule. Electronically Signed   By: Rolm Baptise M.D.   On: 07/18/2017 08:17    Anti-infectives: Anti-infectives    None     Assessment/Plan Blunt anterior chest trauma from chainsaw Sternal FX and B anterior rib FX - pulm toilet, multimodal pain management, 500cc on IS for me Possible cardiac contusion- Appreciate consult by Dr. Einar Gip - no significant EKG results, mildly elevated troponin without sxs of ACS. Echo performed 10/17 and awaiting final read. If echo normal then no further cardiac workup necessary.  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 Dr. Grandville Silos told patient about this. I spoke with patients wife about  mass today. She states that patient doesn't have a pulmonologist but his PCP is Dr. Fabio Pierce.  ABL anemia - hgb 10.2/hct 31 from 12.6/37.9 2 days ago; follow  AKI - BUN up to 41, Creatinine 2.05; hold lisinopril and HCTZ.  PAD - previously on plavix; Per Dr. Einar Gip, ok to d/c plavix.  VTE- Lovenox, SCD's FEN- adv diet, hyperkalemia resolved (3.8)  Dispo-  Floor. Appreciate Dr. Nadyne Coombes assistance.  Increase PO pain control - schedule tylenol, increase ULTRAM.  Possible hospital discharge tomorrow.   LOS: 3 days    [facility] 07/19/2017, 7:30 AM Pager:  [phone] Consults: [phone] Mon-Fri 7:00 am-4:30 pm Sat-Sun 7:00 am-11:30 am

## 2017-07-20 LAB — CBC
HEMATOCRIT: 32.7 % — AB (ref 39.0–52.0)
HEMOGLOBIN: 11.1 g/dL — AB (ref 13.0–17.0)
MCH: 32 pg (ref 26.0–34.0)
MCHC: 33.9 g/dL (ref 30.0–36.0)
MCV: 94.2 fL (ref 78.0–100.0)
Platelets: 274 10*3/uL (ref 150–400)
RBC: 3.47 MIL/uL — ABNORMAL LOW (ref 4.22–5.81)
RDW: 13 % (ref 11.5–15.5)
WBC: 11.5 10*3/uL — ABNORMAL HIGH (ref 4.0–10.5)

## 2017-07-20 LAB — BASIC METABOLIC PANEL
Anion gap: 14 (ref 5–15)
BUN: 39 mg/dL — AB (ref 6–20)
CHLORIDE: 96 mmol/L — AB (ref 101–111)
CO2: 22 mmol/L (ref 22–32)
CREATININE: 1.74 mg/dL — AB (ref 0.61–1.24)
Calcium: 8.3 mg/dL — ABNORMAL LOW (ref 8.9–10.3)
GFR calc Af Amer: 42 mL/min — ABNORMAL LOW (ref >60)
GFR calc non Af Amer: 37 mL/min — ABNORMAL LOW (ref >60)
Glucose, Bld: 108 mg/dL — ABNORMAL HIGH (ref 65–99)
Potassium: 3.4 mmol/L — ABNORMAL LOW (ref 3.5–5.1)
Sodium: 132 mmol/L — ABNORMAL LOW (ref 135–145)

## 2017-07-20 MED ORDER — POLYETHYLENE GLYCOL 3350 17 G PO PACK
17.0000 g | PACK | Freq: Once | ORAL | Status: AC
  Administered 2017-07-20: 17 g via ORAL
  Filled 2017-07-20: qty 1

## 2017-07-20 MED ORDER — ASPIRIN EC 81 MG PO TBEC: 81.0000 mg | DELAYED_RELEASE_TABLET | Freq: Every day | ORAL | 2 refills | Status: DC

## 2017-07-20 MED ORDER — TRAMADOL HCL 50 MG PO TABS: 75.0000 mg | ORAL_TABLET | Freq: Four times a day (QID) | ORAL | 0 refills | Status: DC

## 2017-07-20 MED ORDER — ACETAMINOPHEN 325 MG PO TABS: 650.0000 mg | ORAL_TABLET | Freq: Four times a day (QID) | ORAL | Status: DC | PRN

## 2017-07-20 MED ORDER — DOCUSATE SODIUM 100 MG PO CAPS: 100.0000 mg | ORAL_CAPSULE | Freq: Every day | ORAL | 0 refills | Status: DC

## 2017-07-20 MED ORDER — POTASSIUM CHLORIDE CRYS ER 20 MEQ PO TBCR
40.0000 meq | EXTENDED_RELEASE_TABLET | Freq: Once | ORAL | Status: AC
  Administered 2017-07-20: 40 meq via ORAL
  Filled 2017-07-20: qty 2

## 2017-07-20 NOTE — Progress Notes (Signed)
[facility] Progress Note     Subjective: CC:  Wife at bedside. Patient reports he feels he is moving better with scheduled ultram and breathing better. Also reports he can cough easier. Denies hemoptysis. Per wife the patient has never been diagnosed with emphysema and, at home, after he works outside or mobilizes a lot he gets pale, SOB, and has to lay down. Unsure of baseline O2 sat. Tolerating PO.  Mobilized with tech after my exam - Sats during mobilization were ~92% ORA however after one lap on the floor dropped to 86-88% ORA.   Afebrile, VSS BUN 39, Creatinine 1.74 from 41, 2.05 Objective: Vital signs in last 24 hours: Temp:  [97.6 F (36.4 C)-98.6 F (37 C)] 97.7 F (36.5 C) (10/19 0532) Pulse Rate:  [61-68] 61 (10/19 0532) Resp:  [18] 18 (10/19 0532) BP: (120-152)/(62-65) 148/62 (10/19 0532) SpO2:  [94 %-98 %] 94 % (10/19 0532) Last BM Date: 07/16/17  Intake/Output from previous day: 10/18 0701 - 10/19 0700 In: 1574.7 [P.O.:720; I.V.:854.7] Out: 1400 [Urine:1400] Intake/Output this shift: No intake/output data recorded.  PE: Gen: Alert, NAD, pleasant Card: Regular rate and rhythm, pedal pulses 2+ BL Pulm: Mild tenderness anterior chest wall, normal resp effort with better air movement today, mild expiratory wheezes LUL, no rales.  Abd: Soft, non-tender, non-distended, bowel sounds present Skin: warm and dry, no rashes  Psych: A&Ox3     Lab Results:   Recent Labs  07/19/17 0552 07/20/17 0540  WBC 11.1* 11.5*  HGB 10.2* 11.1*  HCT 31.0* 32.7*  PLT 249 274   BMET  Recent Labs  07/18/17 0317 07/19/17 0552  NA 133* 133*  K 4.0 3.8  CL 102 98*  CO2 22 25  GLUCOSE 134* 115*  BUN 35* 41*  CREATININE 2.22* 2.05*  CALCIUM 8.0* 7.9*   PT/INR No results for input(s): LABPROT, INR in the last 72 hours. CMP     Component Value Date/Time   NA 133 (L) 07/19/2017 0552   K 3.8 07/19/2017 0552   CL 98 (L) 07/19/2017 0552   CO2 25  07/19/2017 0552   GLUCOSE 115 (H) 07/19/2017 0552   BUN 41 (H) 07/19/2017 0552   CREATININE 2.05 (H) 07/19/2017 0552   CREATININE 1.17 02/21/2012 1046   CALCIUM 7.9 (L) 07/19/2017 0552   PROT 7.1 02/21/2012 1046   ALBUMIN 4.6 02/21/2012 1046   AST 19 02/21/2012 1046   ALT 15 02/21/2012 1046   ALKPHOS 73 02/21/2012 1046   BILITOT 0.9 02/21/2012 1046   GFRNONAA 30 (L) 07/19/2017 0552   GFRAA 35 (L) 07/19/2017 0552   Anti-infectives: Anti-infectives    None     Assessment/Plan Blunt anterior chest trauma from chainsaw Sternal FX and B anterior rib FX - pulm toilet, multimodal pain management, 1000cc on IS for me Possible cardiac contusion- Appreciate consult byDr. Einar Gip - no significant EKG results, mildly elevated troponin without sxs of ACS. Echo performed 10/17. 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 Dr. Grandville Silos told patient about this. I spoke with patients wife about mass today. She states that patient doesn't have a pulmonologist but his PCP is Dr. Fabio Pierce. ABL anemia - hgb 11.2 from 10, stable  AKI - BUN 39, Creatinine 1.74; hold lisinopril and HCTZ. PAD - previously on plavix; Per Dr. Einar Gip, ok to d/c plavix.  VTE- Lovenox,SCD's FEN- adv diet, mild hypokalemia (3.4) - replace PO   Dispo- mobilize, monitor O2 sats at rest and  with ambulation, wean O2 as able -- I do suspect his baseline O2 sats are low in the setting of his moderate emphysema and now spiculated lung mass.  Anticipate discharge home later today vs tomorrow Spoke with patients PCP directly today, who has agreed to see the patient in his office in one week to check vitals, BMET, and arrange PET scan.    LOS: 4 days    Jill Alexanders , [facility] 07/20/2017, 7:37 AM Pager: [phone] Consults: [phone] Mon-Fri 7:00 am-4:30 pm Sat-Sun 7:00 am-11:30 am

## 2017-07-20 NOTE — Care Management Important Message (Signed)
Important Message  Patient Details  Name: Nathaniel Long MRN: [id number] Date of Birth: [birth year]   Medicare Important Message Given:  Yes    Orbie Pyo 07/20/2017, 1:53 PM

## 2017-07-20 NOTE — Progress Notes (Addendum)
Physical Therapy Treatment Patient Details Name: Nathaniel Long MRN: [id number] DOB: [birth year] Today's Date: 07/20/2017    History of Present Illness Pt admitted on 07/16/17 with blunt trauma to the chest from a chainsaw injury (dropped on chest).  He sustained sternal fracture with bilateral anterior fifth and sixth rib fractures as well as an anterior mediastinal hematoma.  Pt has a past medical history including  Anxiety; GERD; Hypertension; Back surgery (1986); Hemorrhoid surgery; left ring finger repair; and ABDOMINAL AORTOGRAM W/LOWER EXTREMITY (05/01/2017). PTA, pt independent, lives with spouse.   PT Comments    Pt with improved mobility this session; indep for bed mob and supervision for ambulation. SpO2 down to 85% on 2L O2 [place] while ambulating; increased to 90-93% with seated rest and deep breathing. Continues to demonstrate intermittent instability (no LOB) while amb with no AD; may benefit from intermittent use of RW at home. Pt declining further mobility secondary to arrival of lunch. Will continue to follow acutely to maximize functional mobility and independence.   Follow Up Recommendations  No PT follow up;Supervision/Assistance - 24 hour     Equipment Recommendations  None recommended by PT    Recommendations for Other Services       Precautions / Restrictions Precautions Precautions: None Restrictions Weight Bearing Restrictions: No    Mobility  Bed Mobility Overal bed mobility: Modified Independent Bed Mobility: Supine to Sit     Supine to sit: HOB elevated;Modified independent (Device/Increase time)        Transfers Overall transfer level: Needs assistance Equipment used: None Transfers: Sit to/from Stand Sit to Stand: Supervision         General transfer comment: Stood from bed and toilet with upervision for safety; assist for line management. Indep with pericare at toilet  Ambulation/Gait Ambulation/Gait assistance: Supervision Ambulation  Distance (Feet): 40 Feet Assistive device: None Gait Pattern/deviations: Step-through pattern;Decreased stride length Gait velocity: Decreased Gait velocity interpretation: <1.8 ft/sec, indicative of risk for recurrent falls General Gait Details: Amb in room with supervision for safety; mild unsteadiness, but no LOB. Declining further amb secondary to arrival of lunch. SpO2 down to 85% on 2L O2 [place] with amb; increased to >90% on 2LO2 with rest and deep breathing technique.    Stairs            Wheelchair Mobility    Modified Rankin (Stroke Patients Only)       Balance Overall balance assessment: Needs assistance Sitting-balance support: No upper extremity supported;Feet supported Sitting balance-Leahy Scale: Good     Standing balance support: No upper extremity supported;During functional activity Standing balance-Leahy Scale: Fair                              Cognition Arousal/Alertness: Awake/alert Behavior During Therapy: WFL for tasks assessed/performed Overall Cognitive Status: Within Functional Limits for tasks assessed                                 General Comments: Very HOH      Exercises      General Comments        Pertinent Vitals/Pain Pain Assessment: Faces Faces Pain Scale: Hurts a little bit Pain Location: chest Pain Descriptors / Indicators: Sore;Discomfort Pain Intervention(s): Monitored during session    Home Living  Prior Function            PT Goals (current goals can now be found in the care plan section) Acute Rehab PT Goals Patient Stated Goal: to get home PT Goal Formulation: With patient Time For Goal Achievement: 07/25/17 Potential to Achieve Goals: Good Progress towards PT goals: Progressing toward goals    Frequency    Min 3X/week      PT Plan Current plan remains appropriate    Co-evaluation              AM-PAC PT "6 Clicks" Daily Activity   Outcome Measure  Difficulty turning over in bed (including adjusting bedclothes, sheets and blankets)?: None Difficulty moving from lying on back to sitting on the side of the bed? : None Difficulty sitting down on and standing up from a chair with arms (e.g., wheelchair, bedside commode, etc,.)?: None Help needed moving to and from a bed to chair (including a wheelchair)?: A Little Help needed walking in hospital room?: A Little Help needed climbing 3-5 steps with a railing? : A Little 6 Click Score: 21    End of Session Equipment Utilized During Treatment: Gait belt Activity Tolerance: Patient tolerated treatment well Patient left: with call bell/phone within reach;in bed Nurse Communication: Mobility status;Other (comment) (SpO2 needs) PT Visit Diagnosis: Difficulty in walking, not elsewhere classified (R26.2)     Time: 1345-1400 PT Time Calculation (min) (ACUTE ONLY): 15 min  Charges:  $Therapeutic Activity: 8-22 mins                    G Codes:      Mabeline Caras, PT, DPT Acute Rehab Services  Pager: [place] 07/20/2017, 2:14 PM

## 2017-07-21 MED ORDER — HYDROCODONE-ACETAMINOPHEN 5-325 MG PO TABS: 1.0000 | ORAL_TABLET | Freq: Four times a day (QID) | ORAL | 0 refills | Status: DC | PRN

## 2017-07-21 NOTE — Discharge Summary (Signed)
Patient ID: Nathaniel Long [id number] y.o. 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  He was cutting trees the chainsaw when the chainsaw kicked back and the blunt end hit him in the chest.He was brought by EMS to the hospital complaining of chest pain and mild shortness of breath.  He did have some mild decreased o2 sats but responded to oxygen.   A chest xray showed right 6th and possible 7th rib fracture without pneumothorax.  EKG showed bordeline ST changes.  He is otherwise without complaints. There was no LOC.  Hospital Course: on the day of admission the patient was evaluated in the ED by the ED physician and by Dr. Coralie Keens of the trauma service.  Chest x-ray and CT chest were performed with findings as described above.  He was admitted for pain control, pulmonary toilet respiratory support.     Yet low lung volumes and variable oxygen saturations for the first couple of days.he had mildly elevated troponin.  He had difficulty coughing.  He was seen by Dr. Einar Gip, his  cardiologist who noted no arrhythmias and unremarkable EKG.cardiac contusion was felt to be possible, however.  CT angiogram of chest showed the sternal and rib fractures.  Also noted was a 16 mm spiculated left upper lobe lung mass morphologically suspicious for lung carcinoma.  This was discussed with all the physicians and the patient and outpatient follow-up was advised.     Echocardiogram on 07/18/2017 showed normal LVEF.  No wall motion abnormality.  Mild diastolic function.  Trace aortic stenosis.  No pericardial effusion.     Lab work at discharge revealed hemoglobin 11.1, WBC 11,500, creatinine 1.74, sodium 132, potassium 3.4.  Glucose 108.     The patient's pulmonary function steadily improved.  Oxygen saturations were sustained above 95% on room air.  Pain was under control although still a little tender in the left presternal area.     Dr. Einar Gip felt that he had not had a cardiac contusion.  Just in his antihypertensive medications.      Outpatient follow-up with Dr. Lyndee Hensen in 10 days for hypertension and lung mass and renal failure with repeat BMP.     The trauma PA spoke with the patient's PCP directly and who agreed to see the patient in the office in one week to check vitals, BMP, arrange PET scan for evaluation of lung mass.     He will follow-up in the trauma clinic as needed.  He was given a prescription for Norco for pain.        Consults: cardiology  Significant Diagnostic Studies: chest x-ray, CT scan, lab work  Treatments: IV hydration, pulmonary toilet, oxygen, cardiac evaluation, pain control  Disposition: Home  Patient Instructions:  Allergies as of 07/21/2017      Reactions   Gabapentin    dizziness   Statins    Muscle weakness      Medication List    STOP taking these medications   clopidogrel 75 MG tablet Commonly known as:  PLAVIX   ibuprofen 200 MG tablet Commonly known as:  ADVIL,MOTRIN   lisinopril-hydrochlorothiazide 10-12.5 MG  tablet Commonly known as:  PRINZIDE,ZESTORETIC   metoprolol succinate 25 MG 24 hr tablet Commonly known as:  TOPROL-XL     TAKE these medications   acetaminophen 325 MG tablet Commonly known as:  TYLENOL Take 2 tablets (650 mg total) by mouth every 6 (six) hours as needed.   ALPRAZolam 0.5 MG tablet Commonly known as:  XANAX Take 0.5 mg by mouth at bedtime.   aspirin EC 81 MG tablet Take 1 tablet (81 mg total) by mouth daily.   calcium carbonate 500 MG chewable tablet Commonly known as:  TUMS - dosed in mg elemental calcium Chew 1 tablet by mouth daily as needed for indigestion or heartburn.   CENTRUM PO Take 1 tablet by mouth every evening.   cetirizine 10 MG tablet Commonly known as:  ZYRTEC TAKE 1 TABLET BY MOUTH AT BEDTIME What changed:  See the new instructions.   docusate sodium 100 MG capsule Commonly known as:  COLACE Take 1 capsule (100 mg total) by mouth daily.   fish oil-omega-3 fatty acids 1000 MG capsule Take 1 g by mouth every evening.   HYDROcodone-acetaminophen 5-325 MG tablet Commonly known as:  NORCO Take 1-2 tablets by mouth every 6 (six) hours as needed for moderate pain or severe pain.   LIVALO 4 MG Tabs Generic drug:  Pitavastatin Calcium Take 2 mg by mouth every evening.   pantoprazole 40 MG tablet Commonly known as:  PROTONIX Take 40 mg by mouth every evening.   sodium chloride 0.65 % Soln  nasal spray Commonly known as:  OCEAN Place 1 spray into both nostrils as needed for congestion.   traMADol 50 MG tablet Commonly known as:  ULTRAM Take 1.5 tablets (75 mg total) by mouth every 6 (six) hours.   Vitamin D 2000 units Caps Take 4,000 Units by mouth every evening.       Activity: activity as tolerated Diet: low fat, low cholesterol diet Wound Care: none needed  Follow-up:  With [facility] trauma clinic in as needed in the future.  Signed: Edsel Petrin. Dalbert Batman, M.D., FACS General and minimally invasive surgery Breast and Colorectal  Surgery Trauma   Addendum: I log known to the [email or web address] and reviewed his prescription medication history  07/21/2017, 11:38 AM

## 2017-07-21 NOTE — Plan of Care (Signed)
Problem: Pain Managment: Goal: General experience of comfort will improve Outcome: Progressing Pt states he has soreness when he moves.

## 2017-07-21 NOTE — Progress Notes (Signed)
Discharge instructions given to patient.  All questions were answered

## 2017-07-23 NOTE — Progress Notes (Signed)
A half of Tramadol (.25 mg) was wasted at around 11:55 am.  Half a pill was discarded in the sharps container.

## 2017-07-31 NOTE — Progress Notes (Addendum)
Nathaniel Long    [id number]    June 05, 1941  Primary Care Physician:Moreira, Carloyn Manner, MD  Referring Physician: [name], MD [address] [place], [place] [address]  Chief complaint: Consult for lung mass.  HPI: 76 year old with history of GERD, hypertension.  He had an evaluation in the ED on 10/15 after a fall with blunt chest trauma.  CT scan of the chest did not reveal any fracture.  But there was a left upper lobe spiculated nodule and he has been referred to pulmonary for further evaluation. Sternal fracture is being managed conservatively.  Noted to have slightly elevated troponin during that admission.  He was evaluated by his cardiologist Dr. Einar Gip with no concern for cardiac ischemia.  He was on Plavix for carotid stenosis.  This has been held and he was started on aspirin.  Chief complaint is cough with white mucus.  Denies any fevers, chills, hemoptysis, loss of weight, loss of appetite. He has 60-pack-year smoking history.  Quit 3 months ago.  Outpatient Encounter Prescriptions as of 08/01/2017  Medication Sig  . acetaminophen (TYLENOL) 325 MG tablet Take 2 tablets (650 mg total) by mouth every 6 (six) hours as needed.  . ALPRAZolam (XANAX) 0.5 MG tablet Take 0.5 mg by mouth at bedtime.   Marland Kitchen aspirin EC 81 MG tablet Take 1 tablet (81 mg total) by mouth daily.  . calcium carbonate (TUMS - DOSED IN MG ELEMENTAL CALCIUM) 500 MG chewable tablet Chew 1 tablet by mouth daily as needed for indigestion or heartburn.  . cetirizine (ZYRTEC) 10 MG tablet TAKE 1 TABLET BY MOUTH AT BEDTIME (Patient taking differently: Take 10 mg daily as needed for allergies)  . Cholecalciferol (VITAMIN D) 2000 units CAPS Take 4,000 Units by mouth every evening.  . docusate sodium (COLACE) 100 MG capsule Take 1 capsule (100 mg total) by mouth daily.  . fish oil-omega-3 fatty acids 1000 MG capsule Take 1 g by mouth every evening.   Marland Kitchen HYDROcodone-acetaminophen (NORCO) 5-325 MG tablet Take 1 tablet  by mouth every 6 (six) hours as needed for moderate pain or severe pain.  . Multiple Vitamins-Minerals (CENTRUM PO) Take 1 tablet by mouth every evening.   . pantoprazole (PROTONIX) 40 MG tablet Take 40 mg by mouth every evening.  . Pitavastatin Calcium (LIVALO) 4 MG TABS Take 2 mg by mouth every evening.   . sodium chloride (OCEAN) 0.65 % SOLN nasal spray Place 1 spray into both nostrils as needed for congestion.  . traMADol (ULTRAM) 50 MG tablet Take 1.5 tablets (75 mg total) by mouth every 6 (six) hours.   No facility-administered encounter medications on file as of 08/01/2017.     Allergies as of 08/01/2017 - Review Complete 07/16/2017  Allergen Reaction Noted  . Gabapentin  04/26/2017  . Statins  01/04/2017    Past Medical History:  Diagnosis Date  . Anxiety   . Chronic lower back pain   . Complication of anesthesia    "can't pee when I come out of OR" (07/18/2017)  . Contact with chainsaw as cause of accidental injury 07/16/2017   sternal fracture with bilateral anterior fifth and sixth rib fractures /notes 07/16/2017  . GERD (gastroesophageal reflux disease)   . High cholesterol   . Hypertension     Past Surgical History:  Procedure Laterality Date  . ABDOMINAL AORTOGRAM W/LOWER EXTREMITY N/A 05/01/2017   Procedure: Abdominal Aortogram w/Lower Extremity;  Surgeon: Adrian Prows, MD;  Location: [facility];  Service: Cardiovascular;  Laterality: N/A;  . BACK SURGERY    . FINGER REPLANTATION Left    "ring finger"  . HEMORRHOID SURGERY    . POSTERIOR LUMBAR FUSION  1986    Family History  Problem Relation Age of Onset  . Colon polyps Brother     Social History   Social History  . Marital status: Married    Spouse name: N/A  . Number of children: N/A  . Years of education: N/A   Occupational History  . Not on file.   Social History Main Topics  . Smoking status: Former Smoker    Packs/day: 1.00    Years: 33.00    Types: Cigarettes  . Smokeless  tobacco: Never Used     Comment: 07/18/2017 "stopped ~several months ago"  . Alcohol use Yes     Comment: 07/18/2017 "drank alot of beer; stopped in ~ 1993"  . Drug use: No  . Sexual activity: Not on file   Other Topics Concern  . Not on file   Social History Narrative  . No narrative on file    Review of systems: Review of Systems  Constitutional: Negative for fever and chills.  HENT: Negative.   Eyes: Negative for blurred vision.  Respiratory: as per HPI  Cardiovascular: Negative for chest pain and palpitations.  Gastrointestinal: Negative for vomiting, diarrhea, blood per rectum. Genitourinary: Negative for dysuria, urgency, frequency and hematuria.  Musculoskeletal: Negative for myalgias, back pain and joint pain.  Skin: Negative for itching and rash.  Neurological: Negative for dizziness, tremors, focal weakness, seizures and loss of consciousness.  Endo/Heme/Allergies: Negative for environmental allergies.  Psychiatric/Behavioral: Negative for depression, suicidal ideas and hallucinations.  All other systems reviewed and are negative.  Physical Exam: There were no vitals taken for this visit. Gen:      No acute distress HEENT:  EOMI, sclera anicteric Neck:     No masses; no thyromegaly Lungs:    Clear to auscultation bilaterally; normal respiratory effort CV:         Regular rate and rhythm; no murmurs Abd:      + bowel sounds; soft, non-tender; no palpable masses, no distension Ext:    No edema; adequate peripheral perfusion Skin:      Warm and dry; no rash Neuro: alert and oriented x 3 Psych: normal mood and affect  Data Reviewed: CT chest 07/16/17-upper sternal fracture with moderate hematoma in the anterior mediastinum, moderate emphysema 16 mm spiculated left upper lobe mass.  Ascending aorta aneurysm, lower lobe consolidation.  I have reviewed all images personally.  Assessment:  Left upper lobe lung mass Suspicious for lung malignancy. Order PET scan for  evaluation We will decide on the best approach to biopsy based on scan findings. Order PT INR, PTT.  Platelets are normal.   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  Evaluate with PFTs No need for inhalers as patient is not symptomatic.  More then 1/2 the time of the 40 min visit was spent in counseling and/or coordination of care with the patient and family.  Plan/Recommendations: - PET scan - PT/INR, PTT  Marshell Garfinkel MD [facility] Pager [phone] 07/31/2017, 4:49 PM  CC: [name], MD

## 2017-08-01 ENCOUNTER — Ambulatory Visit (INDEPENDENT_AMBULATORY_CARE_PROVIDER_SITE_OTHER): Payer: Medicare Other | Admitting: Pulmonary Disease

## 2017-08-01 ENCOUNTER — Other Ambulatory Visit (INDEPENDENT_AMBULATORY_CARE_PROVIDER_SITE_OTHER): Payer: Medicare Other

## 2017-08-01 ENCOUNTER — Encounter: Payer: Self-pay | Admitting: Pulmonary Disease

## 2017-08-01 ENCOUNTER — Telehealth: Payer: Self-pay

## 2017-08-01 VITALS — BP 124/68 | HR 74 | Ht 71.0 in | Wt 188.8 lb

## 2017-08-01 DIAGNOSIS — R911 Solitary pulmonary nodule: Secondary | ICD-10-CM

## 2017-08-01 LAB — PROTIME-INR
INR: 1.1 ratio — ABNORMAL HIGH (ref 0.8–1.0)
PROTHROMBIN TIME: 11.7 s (ref 9.6–13.1)

## 2017-08-01 LAB — APTT: APTT: 28.7 s (ref 23.4–32.7)

## 2017-08-01 NOTE — Telephone Encounter (Signed)
Courtney from Radiology is calling back about the super D. -tr

## 2017-08-01 NOTE — Telephone Encounter (Signed)
Spoke with Nathaniel Long at [facility], she states she has no images to scan onto a disc so it is not ready to be picked up and needs the pt to come back to be scanned again. She apologized for this and wanted to let [facility] know. The patient will not be charged for this scan. PM please advise.    [phone]

## 2017-08-01 NOTE — Telephone Encounter (Signed)
Per PM will await until upcoming PET before proceeding with super D.  I have spoken with Loma Sousa with [facility] radiology and made her aware of this.

## 2017-08-01 NOTE — Telephone Encounter (Signed)
Spoke with Loma Sousa with [facility] radi[facility], requesting CT from 07/16/17 to be made as super D. Loma Sousa states she will call once disc is ready for pickup.

## 2017-08-01 NOTE — Progress Notes (Signed)
.[email or web address]  Title: Blood Sample Collection in Subjects with Pulmonary Nodules or CT Suspicion of Lung  Cancer  Study Number: [id number]; Protocol: Version 4.0, Amendment 3.0 Date: 06Jun2017  Sponsor: [employer] [address] [place] [address]  Principal Investigator: Dr. Marshell Garfinkel ; Sub Investigators: Dr. Brand Males, Dr. Simonne Maffucci  Synopsis: This is multi-site, sample collection study.The study is to obtain de-identified, clinically characterized, whole blood specimens for use in assessing new biomarkers for the detection of neoplasms off the lung.  The study will sample blood (67mL) from approximately 2250 subject; 1000 will have CT suspicion of lung cancer which is ultimately diagnosed as lung cancer and approximately 1000 subjects will have pulmonary nodules greater than or equal to 4 mm .  Total Number of Subjects in Trial: 2250   CT suspicion of lung Cancer ultimately diagnosed  CT suspicion of lung Cancer ultimately benign Pulmonary  Nodules Greater than/equal to 4 mm  Pulmonary Nodules Greater than/equal to 15 mm Pulmonary Nodules Greater than/equal to 10-15 mm  Greater than/equal to 4-9 mm        Number of Subjects 1000 250 1000 100 100 Remaining      Key Inclusion:  Suspicion of Cancer Subjects:  Subject is male or male, 21-64 years of age, inclusive  Subject has CT suspicion of lung cancer and is scheduled for biopsy or other diagnostic procedure.  Pulmonary Nodule Subjects:  Subject is male or male, 2-56 years of age, inclusive  Subject has a recent (within 90 days of enrollment) CT radiological diagnosis of pulmonary nodule(s) greater than equal to 4 mm without a scheduled biopsy/diagnostic procedure.   All Subjects:  Subject understands the study procedures and is able to provide informed consent to participate in the study and authorization for release of relevant protected health information to the study investigator.  Key  Exclusion:  CT with IV contrast within 1 day (or 24 hours) of blood collection  Prior history of cancer with the exception of non melanoma skin cancer and most in-situ carcinomas.  Prior removal of the lung, excluding percutaneous lung biopsy.  Any cytotoxic therapy including chemotherapy and radiation therapy.           Key Features: Lung cancer is now the most common cause of cancer death among men and women.  Key Endpoints: Using whole blood specimens for use in assessing new biomarkers for detection of neoplasms of the lung and for a biorepository for future cancer-related diagnostic test development.  Safety of [employer]: Routine phlebotomy risks of minimal transient pain and possible hematoma formation  Fleischner Society Guidelines for incidental pulmonary nodule follow up:   Nodule Size(mm) Low- Risk Patient High Risk Patient  ? 4 No Follow-up Needed Follow-up at 12 mo; if unchanged,no further follow up   > 4-6 Follow-up CT at 12 months;   if unchanged, no further follow up  Initial follow up CT at 6-12 mo;then at 18-24 months if no change  > 6-8  Initial follow-up CT at 6-12 months then at 18-24 months if no change       Initial follow-up CT at 3-17mos then at 9-12 and 24 months if no change  > 8 Follow-up CT at around 3, 9, and 24 months, dynamic contrast- enhanced CT, PET, and/or biopsy Same as for low-risk patient      Clinical Research Coordinator / Research RN note : This visit for Subject Nathaniel Long with DOB: [birth year] on 08/01/2017 for the above protocol is screening and  enrollment visit  and is for purpose of research . The consent for this encounter is Protocol Version 5.0 and is currently IRB approved. Subject expressed interest and consent in continuing as a study subject.  All questions were answered regarding the study and subject and subjects wife whom accompanied him had no further questions prior to signing of consent. Subject confirmed that there were no  changes in contact information (e.g. address, telephone, email). Subject thanked for participation in research and contribution to science.   In this visit 08/01/2017 the subject will be evaluated by investigator named Dr. Vaughan Browner  . This research coordinator has verified that the investigator is uptodate with his training logs.  Because this visit is a key visit for screening/enrollment of study this visit is under direct supervision of the PI Dr.Mannam . This PI is  available for this visit.  Signed by,  August Luz  Clinical Research Coordinator / Nurse [facility]  [place], [place] 12:13 PM 08/01/2017

## 2017-08-01 NOTE — Patient Instructions (Addendum)
We will check PTT, PT/INR Schedule PET scan for further evaluation of the lung nodule  Follow-up in 1-2 weeks to review scan and plan for further tests.

## 2017-08-10 ENCOUNTER — Ambulatory Visit (HOSPITAL_COMMUNITY)
Admission: RE | Admit: 2017-08-10 | Discharge: 2017-08-10 | Disposition: A | Discharge status: Home / Self Care | Payer: Medicare Other | Source: Ambulatory Visit | Attending: Pulmonary Disease | Admitting: Pulmonary Disease

## 2017-08-10 DIAGNOSIS — Z79899 Other long term (current) drug therapy: Secondary | ICD-10-CM | POA: Diagnosis not present

## 2017-08-10 DIAGNOSIS — J90 Pleural effusion, not elsewhere classified: Secondary | ICD-10-CM | POA: Insufficient documentation

## 2017-08-10 DIAGNOSIS — R911 Solitary pulmonary nodule: Secondary | ICD-10-CM | POA: Diagnosis not present

## 2017-08-10 DIAGNOSIS — Z8781 Personal history of (healed) traumatic fracture: Secondary | ICD-10-CM | POA: Diagnosis not present

## 2017-08-10 LAB — GLUCOSE, CAPILLARY: GLUCOSE-CAPILLARY: 110 mg/dL — AB (ref 65–99)

## 2017-08-10 MED ORDER — FLUDEOXYGLUCOSE F - 18 (FDG) INJECTION
9.4900 | Freq: Once | INTRAVENOUS | Status: AC | PRN
  Administered 2017-08-10: 9.49 via INTRAVENOUS

## 2017-08-13 ENCOUNTER — Ambulatory Visit: Payer: Medicare Other | Admitting: Pulmonary Disease

## 2017-08-13 ENCOUNTER — Telehealth: Payer: Self-pay | Admitting: Pulmonary Disease

## 2017-08-13 ENCOUNTER — Encounter: Payer: Self-pay | Admitting: Pulmonary Disease

## 2017-08-13 VITALS — BP 126/78 | HR 69 | Ht 69.0 in | Wt 189.0 lb

## 2017-08-13 DIAGNOSIS — R0602 Shortness of breath: Secondary | ICD-10-CM | POA: Diagnosis not present

## 2017-08-13 DIAGNOSIS — R911 Solitary pulmonary nodule: Secondary | ICD-10-CM

## 2017-08-13 MED ORDER — UMECLIDINIUM-VILANTEROL 62.5-25 MCG/INH IN AEPB: 1.0000 | INHALATION_SPRAY | Freq: Every day | RESPIRATORY_TRACT | 3 refills | Status: DC

## 2017-08-13 MED ORDER — UMECLIDINIUM-VILANTEROL 62.5-25 MCG/INH IN AEPB: 1.0000 | INHALATION_SPRAY | Freq: Every day | RESPIRATORY_TRACT | 0 refills | Status: AC

## 2017-08-13 NOTE — Telephone Encounter (Signed)
I already discussed the results of the PET scan with the patient and family today. Not sure what else is needed.

## 2017-08-13 NOTE — Progress Notes (Signed)
Nathaniel Long    [id number]    03-11-1941  Primary Care Physician:Moreira, Carloyn Manner, MD  Referring Physician: [name], MD [address] [place], [place] [address]  Chief complaint: Follow up for lung mass.  HPI: 76 year old with history of GERD, hypertension.  He had an evaluation in the ED on 10/15 after a fall with blunt chest trauma.  CT scan of the chest did not reveal any fracture.  But there was a left upper lobe spiculated nodule and he has been referred to pulmonary for further evaluation. Sternal fracture is being managed conservatively.  Noted to have slightly elevated troponin during that admission.  He was evaluated by his cardiologist Dr. Einar Gip with no concern for cardiac ischemia.  He was on Plavix for carotid stenosis.  This has been held and he was started on aspirin.  He has 60-pack-year smoking history.  Quit 3 months ago.  Interim history: Patient had a PET scan for evaluation of his lung mass.  He is here to discuss results and plan for further management.  He has no new complaints and reports that his dyspnea and chest pain over the sternum is improving.   Outpatient Encounter Medications as of 08/13/2017  Medication Sig  . acetaminophen (TYLENOL) 325 MG tablet Take 2 tablets (650 mg total) by mouth every 6 (six) hours as needed.  . ALPRAZolam (XANAX) 0.5 MG tablet Take 0.5 mg by mouth at bedtime.   Marland Kitchen amLODipine (NORVASC) 5 MG tablet Take 5 mg daily by mouth.  Marland Kitchen aspirin EC 81 MG tablet Take 1 tablet (81 mg total) by mouth daily.  . calcium carbonate (TUMS - DOSED IN MG ELEMENTAL CALCIUM) 500 MG chewable tablet Chew 1 tablet by mouth daily as needed for indigestion or heartburn.  . cetirizine (ZYRTEC) 10 MG tablet TAKE 1 TABLET BY MOUTH AT BEDTIME (Patient taking differently: Take 10 mg daily as needed for allergies)  . Cholecalciferol (VITAMIN D) 2000 units CAPS Take 4,000 Units by mouth every evening.  . docusate sodium (COLACE) 100 MG capsule Take 1  capsule (100 mg total) by mouth daily.  . fish oil-omega-3 fatty acids 1000 MG capsule Take 1 g by mouth every evening.   Marland Kitchen HYDROcodone-acetaminophen (NORCO) 5-325 MG tablet Take 1 tablet by mouth every 6 (six) hours as needed for moderate pain or severe pain.  . Multiple Vitamins-Minerals (CENTRUM PO) Take 1 tablet by mouth every evening.   . pantoprazole (PROTONIX) 40 MG tablet Take 40 mg by mouth every evening.  . Pitavastatin Calcium (LIVALO) 4 MG TABS Take 2 mg by mouth every evening.   . sodium chloride (OCEAN) 0.65 % SOLN nasal spray Place 1 spray into both nostrils as needed for congestion.  . traMADol (ULTRAM) 50 MG tablet Take 1.5 tablets (75 mg total) by mouth every 6 (six) hours.   No facility-administered encounter medications on file as of 08/13/2017.     Allergies as of 08/13/2017 - Review Complete 08/13/2017  Allergen Reaction Noted  . Gabapentin  04/26/2017  . Statins  01/04/2017    Past Medical History:  Diagnosis Date  . Anxiety   . Chronic lower back pain   . Complication of anesthesia    "can't pee when I come out of OR" (07/18/2017)  . Contact with chainsaw as cause of accidental injury 07/16/2017   sternal fracture with bilateral anterior fifth and sixth rib fractures /notes 07/16/2017  . GERD (gastroesophageal reflux disease)   . High  cholesterol   . Hypertension     Past Surgical History:  Procedure Laterality Date  . BACK SURGERY    . FINGER REPLANTATION Left    "ring finger"  . HEMORRHOID SURGERY    . POSTERIOR LUMBAR FUSION  1986    Family History  Problem Relation Age of Onset  . Colon polyps Brother   . Dementia Sister     Social History   Socioeconomic History  . Marital status: Married    Spouse name: Not on file  . Number of children: Not on file  . Years of education: Not on file  . Highest education level: Not on file  Social Needs  . Financial resource strain: Not on file  . Food insecurity - worry: Not on file  . Food  insecurity - inability: Not on file  . Transportation needs - medical: Not on file  . Transportation needs - non-medical: Not on file  Occupational History  . Not on file  Tobacco Use  . Smoking status: Former Smoker    Packs/day: 1.00    Years: 33.00    Pack years: 33.00    Types: Cigarettes  . Smokeless tobacco: Never Used  . Tobacco comment: 07/18/2017 "stopped ~several months ago"  Substance and Sexual Activity  . Alcohol use: Yes    Comment: 07/18/2017 "drank alot of beer; stopped in ~ 1993"  . Drug use: No  . Sexual activity: Not on file  Other Topics Concern  . Not on file  Social History Narrative  . Not on file    Review of systems: Review of Systems  Constitutional: Negative for fever and chills.  HENT: Negative.   Eyes: Negative for blurred vision.  Respiratory: as per HPI  Cardiovascular: Negative for chest pain and palpitations.  Gastrointestinal: Negative for vomiting, diarrhea, blood per rectum. Genitourinary: Negative for dysuria, urgency, frequency and hematuria.  Musculoskeletal: Negative for myalgias, back pain and joint pain.  Skin: Negative for itching and rash.  Neurological: Negative for dizziness, tremors, focal weakness, seizures and loss of consciousness.  Endo/Heme/Allergies: Negative for environmental allergies.  Psychiatric/Behavioral: Negative for depression, suicidal ideas and hallucinations.  All other systems reviewed and are negative.  Physical Exam: There were no vitals taken for this visit. Gen:      No acute distress HEENT:  EOMI, sclera anicteric Neck:     No masses; no thyromegaly Lungs:    Clear to auscultation bilaterally; normal respiratory effort CV:         Regular rate and rhythm; no murmurs Abd:      + bowel sounds; soft, non-tender; no palpable masses, no distension Ext:    No edema; adequate peripheral perfusion Skin:      Warm and dry; no rash Neuro: alert and oriented x 3 Psych: normal mood and affect  Data  Reviewed: CT chest 07/16/17-upper sternal fracture with moderate hematoma in the anterior mediastinum, moderate emphysema 16 mm spiculated left upper lobe mass.  Ascending aorta aneurysm, lower lobe consolidation.  PET scan 08/10/17-right upper lobe 1.6 cm mass which is hypermetabolic.  Low FDG uptake in the subcarinal lymph node. New small bilateral effusion. I have reviewed all images personally.  Assessment:  Left upper lobe lung mass Suspicious for lung malignancy. PET scan reviewed with the patient I have discussed the case with Dr. Lamonte Sakai who has agreed to do an EBUS and navigational bronchoscopy. Super D CT ordered.  Patient is scheduled to have stress test for cardiac clearance on 11/19.  Small pleural effusions Not likely to be malignant.  Effusions are probably from his recent fall.  They are too small to access by thoracentesis.  We will continue to monitor.   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  PFTs are pending. No need for inhalers as patient is not symptomatic.  More then 1/2 the time of the 40 min visit was spent in counseling and/or coordination of care with the patient and family.  Plan/Recommendations: -Scheduled for bronchoscopy with biopsy  Marshell Garfinkel MD [facility] Pager [phone] 08/13/2017, 10:31 AM  CC: [name], MD

## 2017-08-13 NOTE — H&P (View-Only) (Signed)
Nathaniel Long    [id number]    Feb 19, 1941  Primary Care Physician:Moreira, Carloyn Manner, MD  Referring Physician: [name], MD [address] [place], [place] [address]  Chief complaint: Follow up for lung mass.  HPI: 76 year old with history of GERD, hypertension.  He had an evaluation in the ED on 10/15 after a fall with blunt chest trauma.  CT scan of the chest did not reveal any fracture.  But there was a left upper lobe spiculated nodule and he has been referred to pulmonary for further evaluation. Sternal fracture is being managed conservatively.  Noted to have slightly elevated troponin during that admission.  He was evaluated by his cardiologist Dr. Einar Gip with no concern for cardiac ischemia.  He was on Plavix for carotid stenosis.  This has been held and he was started on aspirin.  He has 60-pack-year smoking history.  Quit 3 months ago.  Interim history: Patient had a PET scan for evaluation of his lung mass.  He is here to discuss results and plan for further management.  He has no new complaints and reports that his dyspnea and chest pain over the sternum is improving.   Outpatient Encounter Medications as of 08/13/2017  Medication Sig  . acetaminophen (TYLENOL) 325 MG tablet Take 2 tablets (650 mg total) by mouth every 6 (six) hours as needed.  . ALPRAZolam (XANAX) 0.5 MG tablet Take 0.5 mg by mouth at bedtime.   Marland Kitchen amLODipine (NORVASC) 5 MG tablet Take 5 mg daily by mouth.  Marland Kitchen aspirin EC 81 MG tablet Take 1 tablet (81 mg total) by mouth daily.  . calcium carbonate (TUMS - DOSED IN MG ELEMENTAL CALCIUM) 500 MG chewable tablet Chew 1 tablet by mouth daily as needed for indigestion or heartburn.  . cetirizine (ZYRTEC) 10 MG tablet TAKE 1 TABLET BY MOUTH AT BEDTIME (Patient taking differently: Take 10 mg daily as needed for allergies)  . Cholecalciferol (VITAMIN D) 2000 units CAPS Take 4,000 Units by mouth every evening.  . docusate sodium (COLACE) 100 MG capsule Take 1  capsule (100 mg total) by mouth daily.  . fish oil-omega-3 fatty acids 1000 MG capsule Take 1 g by mouth every evening.   Marland Kitchen HYDROcodone-acetaminophen (NORCO) 5-325 MG tablet Take 1 tablet by mouth every 6 (six) hours as needed for moderate pain or severe pain.  . Multiple Vitamins-Minerals (CENTRUM PO) Take 1 tablet by mouth every evening.   . pantoprazole (PROTONIX) 40 MG tablet Take 40 mg by mouth every evening.  . Pitavastatin Calcium (LIVALO) 4 MG TABS Take 2 mg by mouth every evening.   . sodium chloride (OCEAN) 0.65 % SOLN nasal spray Place 1 spray into both nostrils as needed for congestion.  . traMADol (ULTRAM) 50 MG tablet Take 1.5 tablets (75 mg total) by mouth every 6 (six) hours.   No facility-administered encounter medications on file as of 08/13/2017.     Allergies as of 08/13/2017 - Review Complete 08/13/2017  Allergen Reaction Noted  . Gabapentin  04/26/2017  . Statins  01/04/2017    Past Medical History:  Diagnosis Date  . Anxiety   . Chronic lower back pain   . Complication of anesthesia    "can't pee when I come out of OR" (07/18/2017)  . Contact with chainsaw as cause of accidental injury 07/16/2017   sternal fracture with bilateral anterior fifth and sixth rib fractures /notes 07/16/2017  . GERD (gastroesophageal reflux disease)   . High  cholesterol   . Hypertension     Past Surgical History:  Procedure Laterality Date  . BACK SURGERY    . FINGER REPLANTATION Left    "ring finger"  . HEMORRHOID SURGERY    . POSTERIOR LUMBAR FUSION  1986    Family History  Problem Relation Age of Onset  . Colon polyps Brother   . Dementia Sister     Social History   Socioeconomic History  . Marital status: Married    Spouse name: Not on file  . Number of children: Not on file  . Years of education: Not on file  . Highest education level: Not on file  Social Needs  . Financial resource strain: Not on file  . Food insecurity - worry: Not on file  . Food  insecurity - inability: Not on file  . Transportation needs - medical: Not on file  . Transportation needs - non-medical: Not on file  Occupational History  . Not on file  Tobacco Use  . Smoking status: Former Smoker    Packs/day: 1.00    Years: 33.00    Pack years: 33.00    Types: Cigarettes  . Smokeless tobacco: Never Used  . Tobacco comment: 07/18/2017 "stopped ~several months ago"  Substance and Sexual Activity  . Alcohol use: Yes    Comment: 07/18/2017 "drank alot of beer; stopped in ~ 1993"  . Drug use: No  . Sexual activity: Not on file  Other Topics Concern  . Not on file  Social History Narrative  . Not on file    Review of systems: Review of Systems  Constitutional: Negative for fever and chills.  HENT: Negative.   Eyes: Negative for blurred vision.  Respiratory: as per HPI  Cardiovascular: Negative for chest pain and palpitations.  Gastrointestinal: Negative for vomiting, diarrhea, blood per rectum. Genitourinary: Negative for dysuria, urgency, frequency and hematuria.  Musculoskeletal: Negative for myalgias, back pain and joint pain.  Skin: Negative for itching and rash.  Neurological: Negative for dizziness, tremors, focal weakness, seizures and loss of consciousness.  Endo/Heme/Allergies: Negative for environmental allergies.  Psychiatric/Behavioral: Negative for depression, suicidal ideas and hallucinations.  All other systems reviewed and are negative.  Physical Exam: There were no vitals taken for this visit. Gen:      No acute distress HEENT:  EOMI, sclera anicteric Neck:     No masses; no thyromegaly Lungs:    Clear to auscultation bilaterally; normal respiratory effort CV:         Regular rate and rhythm; no murmurs Abd:      + bowel sounds; soft, non-tender; no palpable masses, no distension Ext:    No edema; adequate peripheral perfusion Skin:      Warm and dry; no rash Neuro: alert and oriented x 3 Psych: normal mood and affect  Data  Reviewed: CT chest 07/16/17-upper sternal fracture with moderate hematoma in the anterior mediastinum, moderate emphysema 16 mm spiculated left upper lobe mass.  Ascending aorta aneurysm, lower lobe consolidation.  PET scan 08/10/17-right upper lobe 1.6 cm mass which is hypermetabolic.  Low FDG uptake in the subcarinal lymph node. New small bilateral effusion. I have reviewed all images personally.  Assessment:  Left upper lobe lung mass Suspicious for lung malignancy. PET scan reviewed with the patient I have discussed the case with Dr. Lamonte Sakai who has agreed to do an EBUS and navigational bronchoscopy. Super D CT ordered.  Patient is scheduled to have stress test for cardiac clearance on 11/19.  Small pleural effusions Not likely to be malignant.  Effusions are probably from his recent fall.  They are too small to access by thoracentesis.  We will continue to monitor.   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  PFTs are pending. No need for inhalers as patient is not symptomatic.  More then 1/2 the time of the 40 min visit was spent in counseling and/or coordination of care with the patient and family.  Plan/Recommendations: -Scheduled for bronchoscopy with biopsy  Marshell Garfinkel MD [facility] Pager [phone] 08/13/2017, 10:31 AM  CC: [name], MD

## 2017-08-13 NOTE — Telephone Encounter (Signed)
PM please advise on review of PET scan.

## 2017-08-13 NOTE — Patient Instructions (Signed)
We will give you a sample of anoro and schedule for PFTs I will review your scan with my partner and see if we can arrange a biopsy of the lung Please follow up with Dr. Einar Gip to get your heart checked out.  Follow up in 1 month

## 2017-08-13 NOTE — Telephone Encounter (Signed)
Discussed results with Olivia Mackie (daughter, dpr on file), to best of my ability.  Nothing further needed.

## 2017-08-17 ENCOUNTER — Ambulatory Visit (INDEPENDENT_AMBULATORY_CARE_PROVIDER_SITE_OTHER)
Admission: RE | Admit: 2017-08-17 | Discharge: 2017-08-17 | Disposition: A | Discharge status: Home / Self Care | Payer: Medicare Other | Source: Ambulatory Visit | Attending: Pulmonary Disease | Admitting: Pulmonary Disease

## 2017-08-17 DIAGNOSIS — R911 Solitary pulmonary nodule: Secondary | ICD-10-CM | POA: Diagnosis not present

## 2017-08-31 NOTE — Pre-Procedure Instructions (Signed)
    FAVOR HACKLER  08/31/2017      [id number] [place], [place] - [address] [place] [address] [place] [address] Phone: [phone] Fax: [phone]    Your procedure is scheduled on 09-05-2017 Wednesday   Report to [facility] Admitting at 6:30 A.M.  Call this number if you have problems the morning of surgery:  [phone]   Remember:  Do not eat food or drink liquids after midnight.   Take these medicines the morning of surgery with A SIP OF WATER Tylenol if needed,amlodipine(Norvasc).Anora Ellipta inhaler if needed   STOP ASPIRIN,ANTIINFLAMATORIES (IBUPROFEN,ALEVE,MOTRIN,ADVIL,GOODY'S POWDERS),HERBAL SUPPLEMENTS,FISH OIL,AND VITAMINS 5-7 DAYS PRIOR TO SURGERY   Do not wear jewelry, make-up or nail polish.  Do not wear lotions, powders, or perfumes, or deoderant.  Do not shave 48 hours prior to surgery.  Men may shave face and neck.  Do not bring valuables to the hospital.  [facility] is not responsible for any belongings or valuables.  Contacts, dentures or bridgework may not be worn into surgery.  Leave your suitcase in the car.  After surgery it may be brought to your room.  For patients admitted to the hospital, discharge time will be determined by your treatment team.  Patients discharged the day of surgery will not be allowed to drive home.

## 2017-09-03 ENCOUNTER — Telehealth: Payer: Self-pay

## 2017-09-03 ENCOUNTER — Encounter (HOSPITAL_COMMUNITY)
Admission: RE | Admit: 2017-09-03 | Discharge: 2017-09-03 | Disposition: A | Discharge status: Home / Self Care | Payer: Medicare Other | Source: Ambulatory Visit | Attending: Emergency Medicine | Admitting: Emergency Medicine

## 2017-09-03 ENCOUNTER — Encounter (HOSPITAL_COMMUNITY): Payer: Self-pay

## 2017-09-03 DIAGNOSIS — J90 Pleural effusion, not elsewhere classified: Secondary | ICD-10-CM | POA: Diagnosis not present

## 2017-09-03 DIAGNOSIS — R591 Generalized enlarged lymph nodes: Secondary | ICD-10-CM | POA: Diagnosis present

## 2017-09-03 DIAGNOSIS — Z981 Arthrodesis status: Secondary | ICD-10-CM | POA: Diagnosis not present

## 2017-09-03 DIAGNOSIS — Z7982 Long term (current) use of aspirin: Secondary | ICD-10-CM | POA: Diagnosis not present

## 2017-09-03 DIAGNOSIS — I10 Essential (primary) hypertension: Secondary | ICD-10-CM | POA: Diagnosis not present

## 2017-09-03 DIAGNOSIS — C3412 Malignant neoplasm of upper lobe, left bronchus or lung: Secondary | ICD-10-CM | POA: Diagnosis not present

## 2017-09-03 DIAGNOSIS — K219 Gastro-esophageal reflux disease without esophagitis: Secondary | ICD-10-CM | POA: Diagnosis not present

## 2017-09-03 DIAGNOSIS — Z79899 Other long term (current) drug therapy: Secondary | ICD-10-CM | POA: Diagnosis not present

## 2017-09-03 DIAGNOSIS — F17210 Nicotine dependence, cigarettes, uncomplicated: Secondary | ICD-10-CM | POA: Diagnosis not present

## 2017-09-03 DIAGNOSIS — E7800 Pure hypercholesterolemia, unspecified: Secondary | ICD-10-CM | POA: Diagnosis not present

## 2017-09-03 DIAGNOSIS — J439 Emphysema, unspecified: Secondary | ICD-10-CM | POA: Diagnosis not present

## 2017-09-03 DIAGNOSIS — Z7902 Long term (current) use of antithrombotics/antiplatelets: Secondary | ICD-10-CM | POA: Diagnosis not present

## 2017-09-03 DIAGNOSIS — I6529 Occlusion and stenosis of unspecified carotid artery: Secondary | ICD-10-CM | POA: Diagnosis not present

## 2017-09-03 HISTORY — DX: Dyspnea, unspecified: R06.00

## 2017-09-03 HISTORY — DX: Chronic kidney disease, unspecified: N18.9

## 2017-09-03 HISTORY — DX: Occlusion and stenosis of unspecified carotid artery: I65.29

## 2017-09-03 HISTORY — DX: Aneurysm of the ascending aorta, without rupture: I71.21

## 2017-09-03 HISTORY — DX: Thoracic aortic aneurysm, without rupture: I71.2

## 2017-09-03 LAB — BASIC METABOLIC PANEL
Anion gap: 7 (ref 5–15)
BUN: 17 mg/dL (ref 6–20)
CALCIUM: 9.3 mg/dL (ref 8.9–10.3)
CO2: 23 mmol/L (ref 22–32)
CREATININE: 1.5 mg/dL — AB (ref 0.61–1.24)
Chloride: 108 mmol/L (ref 101–111)
GFR calc Af Amer: 50 mL/min — ABNORMAL LOW (ref >60)
GFR, EST NON AFRICAN AMERICAN: 43 mL/min — AB (ref >60)
GLUCOSE: 107 mg/dL — AB (ref 65–99)
Potassium: 4.2 mmol/L (ref 3.5–5.1)
Sodium: 138 mmol/L (ref 135–145)

## 2017-09-03 LAB — CBC
HCT: 37.3 % — ABNORMAL LOW (ref 39.0–52.0)
Hemoglobin: 12.6 g/dL — ABNORMAL LOW (ref 13.0–17.0)
MCH: 31.3 pg (ref 26.0–34.0)
MCHC: 33.8 g/dL (ref 30.0–36.0)
MCV: 92.8 fL (ref 78.0–100.0)
Platelets: 402 10*3/uL — ABNORMAL HIGH (ref 150–400)
RBC: 4.02 MIL/uL — ABNORMAL LOW (ref 4.22–5.81)
RDW: 13.5 % (ref 11.5–15.5)
WBC: 11.4 10*3/uL — ABNORMAL HIGH (ref 4.0–10.5)

## 2017-09-03 NOTE — Telephone Encounter (Signed)
-----   Message from Marshell Garfinkel, MD sent at 08/29/2017 11:47 AM EST ----- Regarding: RE: nav biopsy Thanks  ----- Message ----- From: Shon Hale, CMA Sent: 08/29/2017  11:25 AM To: Marshell Garfinkel, MD Subject: RE: nav biopsy                                 Bronch has been scheduled for 09/05/17 per Epic.  ----- Message ----- From: Marshell Garfinkel, MD Sent: 08/28/2017   4:07 PM To: Joellen Jersey, Shon Hale, CMA Subject: nav biopsy                                     Hi Libby,  Just following up on the navigational bronch for this patient by Dr. Lamonte Sakai. Has it been set up.  He got his super D CT done. Thanks  [employer]

## 2017-09-03 NOTE — Progress Notes (Signed)
Stress test and OV requested from Dr. Einar Gip

## 2017-09-03 NOTE — Telephone Encounter (Signed)
Contact Dr. Irven Shelling office requesting surgical clearance for upcoming EBUS. Will await surgical clearance.

## 2017-09-04 ENCOUNTER — Encounter (HOSPITAL_COMMUNITY): Payer: Self-pay

## 2017-09-04 NOTE — Progress Notes (Signed)
Anesthesia chart review: Patient is a 76 year old male scheduled for video bronchoscopy with electromagnetic navigation and biopsies on 09/05/2017 by Dr. Baltazar Apo. He had an incidental finding of 16 mm LUL lung mass on chest CT done following blunt force chest trauma from chainsaw kickback on 07/16/17.   History includes former smoker (quit fall 2018), HTN, GERD, anxiety, hypercholesterolemia, CKD, ascending TAA (4.3 cm 07/16/17 CT; 1 year f/u recommended), carotid artery stenosis, chronic low back pain, dyspnea, post-operative urinary retention, lumbar fusion '86, chainsaw accident (sternal and right 5th/6th rib fractures) 07/16/17, finger re-implantation (left 4th finger). He had very mild AS by 07/2017 echo and right renal artery stenosis by 05/01/17 PV study.  - PCP is Dr. [name]. - Pulmonologist is Dr. Marshell Garfinkel. - Cardiologist is Dr. Adrian Prows. He saw patient during 07/2017 admission for blunt chest trauma. Plavix (for PAD) was changed to ASA in the setting of sternal hematoma from blunt chest wall trauma. Elevated troponin felt non-specific. Echo ordered (see below). Out-patient stress test on 08/31/17 (see below).  Meds include Xanax, amlodipine, aspirin 81 mg, Zyrtec, fish oil-omega-3 fatty acid, Protonix, tramadol, Livalo, Anora Ellipta.  BP 124/77   Pulse 71   Temp 36.4 C   Resp 20   Ht 5\' 9"  (1.753 m)   Wt 190 lb 1.6 oz (86.2 kg)   SpO2 97%   BMI 28.07 kg/m   EKG 07/17/17: NSR, septal infarct (age undetermined), non-specific ST/T wave abnormality.  Nuclear stress test 08/31/17 [facility] CV): Impression: 1. Stress EKG is non-diagnostic for ischemia as it is a pharmacologic stress. 2. Gated SPECT images reveal normal myocardial thickening and wall motion. The left ventricular EF was calculated to be 60%. SPECT images demonstrate small perfusion abnormality of mild intensity in the basal inferior and mid inferior myocardial wall(s) on the stress images with mild  reversibility on rest images. This could represent diaphragmatic attenuation. 3. This is a low risk study.   Echo 07/18/17: Study Conclusions - Left ventricle: The cavity size was normal. Systolic function was   normal. The estimated ejection fraction was in the range of 55%   to 60%. Wall motion was normal; there were no regional wall   motion abnormalities. Doppler parameters are consistent with   abnormal left ventricular relaxation (grade 1 diastolic   dysfunction). - Aortic valve: There was very mild stenosis. Mean gradient (S): 10   mm Hg. Valve area (VTI): 2.39 cm^2. Valve area (Vmax): 2.13 cm^2.   Valve area (Vmean): 2.05 cm^2. - Left atrium: The atrium was mildly dilated.  Carotid U/S 03/08/17 [facility] CV): Conclusions: Minimal stenosis in the right internal carotid artery (1-15%). Mild stenosis in the right external carotid artery (< 50%). Stenosis of right common carotid artery of < 50%. Stenosis of the left internal carotid artery (50-69%). Stenosis in the left common carotid artery (> 50%). Mild stenosis of the left external carotid artery (< 50%). Left vertebral artery waveform shows high resistance. Antegrade right vertebral artery flow. Follow-up in 6 months is a appropriate if clinically indicated. Compared to the study done on 09/05/2016, right ICA stenosis of 50-69% stenosis not noted. No change in left ICA stenosis  Abdominal aortogram 05/01/17: Impression: Peripheral arterial disease involving below-knee vessels, does not explain his symptoms, hence continue medical therapy. Has high-grade right renal artery stenosis, blood pressure is well controlled although does have stage III chronic kidney disease. Unless there is worsening renal function, medical therapy for renal artery stenosis. 75 mL contrast utilized.  CT  Super D Chest 08/17/17: IMPRESSION: 1. 13 x 13 mm left upper lobe pulmonary lesion consistent with primary lung neoplasm. 2. Stable mediastinal and hilar lymph  nodes. 3. Emphysematous changes and pulmonary scarring. 4. Remote posttraumatic changes with healing rib and sternal fractures and loculated pleural fluid collections.  PET scan 08/10/17: IMPRESSION: - 1.6 cm hypermetabolic spiculated pulmonary nodule in anterior left upper lobe, highly suspicious for primary bronchogenic carcinoma. - 10 mm subcarinal mediastinal lymph node shows FDG uptake, suspicious for lymph node metastasis. - No evidence of metastatic disease within the neck, abdomen, or pelvis. - New small bilateral multilocular pleural effusions, likely posttraumatic in etiology. Multiple healing bilateral rib and sternal fractures, which do not appear pathologic.  Preoperative labs noted. Cr 1.50, down from 1.74 on 07/20/17. H/H 12.6/37.3, PLT 402K.   If no acute changes then I anticipate that he can proceed as planned.  George Hugh [facility] Short Stay Center/Anesthesiology Phone [phone] 09/04/2017 11:08 AM

## 2017-09-05 ENCOUNTER — Ambulatory Visit (HOSPITAL_COMMUNITY): Payer: Medicare Other | Admitting: Vascular Surgery

## 2017-09-05 ENCOUNTER — Encounter (HOSPITAL_COMMUNITY)
Admission: RE | Disposition: A | Discharge status: Home / Self Care | Payer: Self-pay | Source: Ambulatory Visit | Attending: Emergency Medicine

## 2017-09-05 ENCOUNTER — Ambulatory Visit (HOSPITAL_COMMUNITY): Payer: Medicare Other | Admitting: Certified Registered Nurse Anesthetist

## 2017-09-05 ENCOUNTER — Ambulatory Visit (HOSPITAL_COMMUNITY)
Admission: RE | Admit: 2017-09-05 | Discharge: 2017-09-05 | Disposition: A | Discharge status: Home / Self Care | Payer: Medicare Other | Source: Ambulatory Visit | Attending: Emergency Medicine | Admitting: Emergency Medicine

## 2017-09-05 ENCOUNTER — Ambulatory Visit (HOSPITAL_COMMUNITY): Payer: Medicare Other

## 2017-09-05 ENCOUNTER — Encounter (HOSPITAL_COMMUNITY): Payer: Self-pay | Admitting: *Deleted

## 2017-09-05 DIAGNOSIS — R590 Localized enlarged lymph nodes: Secondary | ICD-10-CM | POA: Diagnosis not present

## 2017-09-05 DIAGNOSIS — C3412 Malignant neoplasm of upper lobe, left bronchus or lung: Secondary | ICD-10-CM | POA: Insufficient documentation

## 2017-09-05 DIAGNOSIS — Z9889 Other specified postprocedural states: Secondary | ICD-10-CM

## 2017-09-05 DIAGNOSIS — I10 Essential (primary) hypertension: Secondary | ICD-10-CM | POA: Insufficient documentation

## 2017-09-05 DIAGNOSIS — K219 Gastro-esophageal reflux disease without esophagitis: Secondary | ICD-10-CM | POA: Diagnosis not present

## 2017-09-05 DIAGNOSIS — Z7902 Long term (current) use of antithrombotics/antiplatelets: Secondary | ICD-10-CM | POA: Insufficient documentation

## 2017-09-05 DIAGNOSIS — J439 Emphysema, unspecified: Secondary | ICD-10-CM | POA: Insufficient documentation

## 2017-09-05 DIAGNOSIS — Z79899 Other long term (current) drug therapy: Secondary | ICD-10-CM | POA: Insufficient documentation

## 2017-09-05 DIAGNOSIS — R911 Solitary pulmonary nodule: Secondary | ICD-10-CM | POA: Diagnosis not present

## 2017-09-05 DIAGNOSIS — Z981 Arthrodesis status: Secondary | ICD-10-CM | POA: Insufficient documentation

## 2017-09-05 DIAGNOSIS — Z7982 Long term (current) use of aspirin: Secondary | ICD-10-CM | POA: Insufficient documentation

## 2017-09-05 DIAGNOSIS — I6529 Occlusion and stenosis of unspecified carotid artery: Secondary | ICD-10-CM | POA: Diagnosis not present

## 2017-09-05 DIAGNOSIS — Z419 Encounter for procedure for purposes other than remedying health state, unspecified: Secondary | ICD-10-CM

## 2017-09-05 DIAGNOSIS — F17210 Nicotine dependence, cigarettes, uncomplicated: Secondary | ICD-10-CM | POA: Insufficient documentation

## 2017-09-05 DIAGNOSIS — E7800 Pure hypercholesterolemia, unspecified: Secondary | ICD-10-CM | POA: Insufficient documentation

## 2017-09-05 DIAGNOSIS — J90 Pleural effusion, not elsewhere classified: Secondary | ICD-10-CM | POA: Insufficient documentation

## 2017-09-05 HISTORY — PX: VIDEO BRONCHOSCOPY WITH ENDOBRONCHIAL NAVIGATION: SHX6175

## 2017-09-05 HISTORY — PX: VIDEO BRONCHOSCOPY WITH ENDOBRONCHIAL ULTRASOUND: SHX6177

## 2017-09-05 SURGERY — VIDEO BRONCHOSCOPY WITH ENDOBRONCHIAL NAVIGATION
Anesthesia: General

## 2017-09-05 MED ORDER — DEXAMETHASONE SODIUM PHOSPHATE 10 MG/ML IJ SOLN
INTRAMUSCULAR | Status: DC | PRN
  Administered 2017-09-05: 5 mg via INTRAVENOUS

## 2017-09-05 MED ORDER — HYDROMORPHONE HCL 1 MG/ML IJ SOLN: 0.2500 mg | INTRAMUSCULAR | Status: DC | PRN

## 2017-09-05 MED ORDER — GLYCOPYRROLATE 0.2 MG/ML IJ SOLN
INTRAMUSCULAR | Status: DC | PRN
  Administered 2017-09-05: .2 mg via INTRAVENOUS

## 2017-09-05 MED ORDER — FENTANYL CITRATE (PF) 100 MCG/2ML IJ SOLN
INTRAMUSCULAR | Status: DC | PRN
  Administered 2017-09-05: 100 ug via INTRAVENOUS
  Administered 2017-09-05: 50 ug via INTRAVENOUS

## 2017-09-05 MED ORDER — ACETAMINOPHEN 325 MG PO TABS
325.0000 mg | ORAL_TABLET | Freq: Four times a day (QID) | ORAL | Status: DC | PRN
Start: 2017-09-05 — End: 2017-10-16

## 2017-09-05 MED ORDER — LACTATED RINGERS IV SOLN
INTRAVENOUS | Status: DC | PRN
  Administered 2017-09-05: 07:00:00 via INTRAVENOUS

## 2017-09-05 MED ORDER — ONDANSETRON HCL 4 MG/2ML IJ SOLN: 4.0000 mg | Freq: Once | INTRAMUSCULAR | Status: DC | PRN

## 2017-09-05 MED ORDER — ONDANSETRON HCL 4 MG/2ML IJ SOLN
INTRAMUSCULAR | Status: DC | PRN
Start: 2017-09-05 — End: 2017-09-05
  Administered 2017-09-05: 4 mg via INTRAVENOUS

## 2017-09-05 MED ORDER — PROPOFOL 10 MG/ML IV BOLUS
INTRAVENOUS | Status: AC
  Filled 2017-09-05: qty 20

## 2017-09-05 MED ORDER — MEPERIDINE HCL 25 MG/ML IJ SOLN: 6.2500 mg | INTRAMUSCULAR | Status: DC | PRN

## 2017-09-05 MED ORDER — LIDOCAINE HCL (CARDIAC) 20 MG/ML IV SOLN
INTRAVENOUS | Status: DC | PRN
  Administered 2017-09-05: 80 mg via INTRAVENOUS

## 2017-09-05 MED ORDER — FENTANYL CITRATE (PF) 250 MCG/5ML IJ SOLN
INTRAMUSCULAR | Status: AC
  Filled 2017-09-05: qty 5

## 2017-09-05 MED ORDER — PROPOFOL 10 MG/ML IV BOLUS
INTRAVENOUS | Status: DC | PRN
  Administered 2017-09-05: 30 mg via INTRAVENOUS
  Administered 2017-09-05: 140 mg via INTRAVENOUS

## 2017-09-05 MED ORDER — SUGAMMADEX SODIUM 200 MG/2ML IV SOLN
INTRAVENOUS | Status: DC | PRN
  Administered 2017-09-05: 150 mg via INTRAVENOUS

## 2017-09-05 MED ORDER — PHENYLEPHRINE HCL 10 MG/ML IJ SOLN
INTRAMUSCULAR | Status: DC | PRN
  Administered 2017-09-05 (×2): 80 ug via INTRAVENOUS
  Administered 2017-09-05: 160 ug via INTRAVENOUS

## 2017-09-05 MED ORDER — PHENYLEPHRINE HCL 10 MG/ML IJ SOLN
INTRAVENOUS | Status: DC | PRN
  Administered 2017-09-05: 50 ug/min via INTRAVENOUS

## 2017-09-05 MED ORDER — ROCURONIUM BROMIDE 100 MG/10ML IV SOLN
INTRAVENOUS | Status: DC | PRN
  Administered 2017-09-05: 50 mg via INTRAVENOUS
  Administered 2017-09-05: 20 mg via INTRAVENOUS

## 2017-09-05 MED ORDER — EPHEDRINE SULFATE 50 MG/ML IJ SOLN
INTRAMUSCULAR | Status: DC | PRN
  Administered 2017-09-05: 15 mg via INTRAVENOUS

## 2017-09-05 MED ORDER — 0.9 % SODIUM CHLORIDE (POUR BTL) OPTIME
TOPICAL | Status: DC | PRN
Start: 2017-09-05 — End: 2017-09-05
  Administered 2017-09-05: 1000 mL

## 2017-09-05 SURGICAL SUPPLY — 46 items
ADAPTER BRONCH F/PENTAX (ADAPTER) ×3 IMPLANT
ADPR BSCP EDG PNTX (ADAPTER) ×1
BRUSH CYTOL CELLEBRITY 1.5X140 (MISCELLANEOUS) ×3 IMPLANT
BRUSH SUPERTRAX BIOPSY (INSTRUMENTS) ×2 IMPLANT
BRUSH SUPERTRAX NDL-TIP CYTO (INSTRUMENTS) ×3 IMPLANT
CANISTER SUCT 3000ML PPV (MISCELLANEOUS) ×3 IMPLANT
CHANNEL WORK EXTEND EDGE 180 (KITS) ×2 IMPLANT
CHANNEL WORK EXTEND EDGE 45 (KITS) IMPLANT
CHANNEL WORK EXTEND EDGE 90 (KITS) IMPLANT
CONT SPEC 4OZ CLIKSEAL STRL BL (MISCELLANEOUS) ×3 IMPLANT
COVER BACK TABLE 60X90IN (DRAPES) ×3 IMPLANT
COVER DOME SNAP 22 D (MISCELLANEOUS) ×3 IMPLANT
FILTER STRAW FLUID ASPIR (MISCELLANEOUS) IMPLANT
FORCEPS BIOP RJ4 1.8 (CUTTING FORCEPS) IMPLANT
FORCEPS BIOP SUPERTRX PREMAR (INSTRUMENTS) ×3 IMPLANT
GAUZE SPONGE 4X4 12PLY STRL (GAUZE/BANDAGES/DRESSINGS) ×3 IMPLANT
GLOVE BIO SURGEON STRL SZ7.5 (GLOVE) ×6 IMPLANT
GOWN STRL REUS W/ TWL LRG LVL3 (GOWN DISPOSABLE) ×2 IMPLANT
GOWN STRL REUS W/TWL LRG LVL3 (GOWN DISPOSABLE) ×6
KIT CLEAN ENDO COMPLIANCE (KITS) ×6 IMPLANT
KIT LOCATABLE GUIDE (CANNULA) IMPLANT
KIT MARKER FIDUCIAL DELIVERY (KITS) ×2 IMPLANT
KIT PROCEDURE EDGE 180 (KITS) IMPLANT
KIT PROCEDURE EDGE 45 (KITS) IMPLANT
KIT PROCEDURE EDGE 90 (KITS) IMPLANT
KIT ROOM TURNOVER OR (KITS) ×3 IMPLANT
MARKER FIDUCIAL SL NIT COIL (Implant Marker) ×6 IMPLANT
MARKER SKIN DUAL TIP RULER LAB (MISCELLANEOUS) ×3 IMPLANT
NDL EBUS SONO TIP PENTAX (NEEDLE) ×1 IMPLANT
NDL SUPERTRX PREMARK BIOPSY (NEEDLE) ×1 IMPLANT
NEEDLE EBUS SONO TIP PENTAX (NEEDLE) ×3 IMPLANT
NEEDLE SUPERTRX PREMARK BIOPSY (NEEDLE) ×3 IMPLANT
NS IRRIG 1000ML POUR BTL (IV SOLUTION) ×3 IMPLANT
OIL SILICONE PENTAX (PARTS (SERVICE/REPAIRS)) ×3 IMPLANT
PAD ARMBOARD 7.5X6 YLW CONV (MISCELLANEOUS) ×6 IMPLANT
PATCHES PATIENT (LABEL) ×9 IMPLANT
SYR 20CC LL (SYRINGE) ×6 IMPLANT
SYR 20ML ECCENTRIC (SYRINGE) ×6 IMPLANT
SYR 50ML SLIP (SYRINGE) ×3 IMPLANT
SYR 5ML LUER SLIP (SYRINGE) ×3 IMPLANT
TOWEL OR 17X24 6PK STRL BLUE (TOWEL DISPOSABLE) ×3 IMPLANT
TRAP SPECIMEN MUCOUS 40CC (MISCELLANEOUS) IMPLANT
TUBE CONNECTING 20'X1/4 (TUBING) ×2
TUBE CONNECTING 20X1/4 (TUBING) ×4 IMPLANT
UNDERPAD 30X30 (UNDERPADS AND DIAPERS) ×3 IMPLANT
WATER STERILE IRR 1000ML POUR (IV SOLUTION) ×3 IMPLANT

## 2017-09-05 NOTE — Anesthesia Preprocedure Evaluation (Signed)
Anesthesia Evaluation  Patient identified by MRN, date of birth, ID band Patient awake    Reviewed: Allergy & Precautions, NPO status , Patient's Chart, lab work & pertinent test results  Airway Mallampati: I  TM Distance: >3 FB Neck ROM: Full    Dental   Pulmonary former smoker,    Pulmonary exam normal        Cardiovascular hypertension, Pt. on medications Normal cardiovascular exam     Neuro/Psych Anxiety    GI/Hepatic GERD  Medicated and Controlled,  Endo/Other    Renal/GU      Musculoskeletal   Abdominal   Peds  Hematology   Anesthesia Other Findings   Reproductive/Obstetrics                             Anesthesia Physical Anesthesia Plan  ASA: III  Anesthesia Plan: General   Post-op Pain Management:    Induction: Intravenous  PONV Risk Score and Plan: 2 and Ondansetron and Dexamethasone  Airway Management Planned: Oral ETT  Additional Equipment:   Intra-op Plan:   Post-operative Plan: Extubation in OR  Informed Consent: I have reviewed the patients History and Physical, chart, labs and discussed the procedure including the risks, benefits and alternatives for the proposed anesthesia with the patient or authorized representative who has indicated his/her understanding and acceptance.     Plan Discussed with: CRNA and Surgeon  Anesthesia Plan Comments:         Anesthesia Quick Evaluation

## 2017-09-05 NOTE — Transfer of Care (Signed)
Immediate Anesthesia Transfer of Care Note  Patient: Nathaniel Long  Procedure(s) Performed: VIDEO BRONCHOSCOPY WITH ENDOBRONCHIAL NAVIGATION (N/A ) VIDEO BRONCHOSCOPY WITH ENDOBRONCHIAL ULTRASOUND (N/A )  Patient Location: PACU  Anesthesia Type:General  Level of Consciousness: drowsy and responds to stimulation  Airway & Oxygen Therapy: Patient Spontanous Breathing and Patient connected to nasal cannula oxygen  Post-op Assessment: Report given to RN and Post -op Vital signs reviewed and stable  Post vital signs: Reviewed and stable  Last Vitals:  Vitals:   09/05/17 0650 09/05/17 1027  BP: (!) 147/70 126/62  Pulse: 62 73  Resp: 17 20  Temp: 36.5 C 36.6 C  SpO2: 95% 95%    Last Pain:  Vitals:   09/05/17 1027  PainSc: (P) Asleep         Complications: No apparent anesthesia complications

## 2017-09-05 NOTE — Discharge Instructions (Signed)
Flexible Bronchoscopy, Care After These instructions give you information on caring for yourself after your procedure. Your doctor may also give you more specific instructions. Call your doctor if you have any problems or questions after your procedure. Follow these instructions at home:  Do not eat or drink anything for 2 hours after your procedure. If you try to eat or drink before the medicine wears off, food or drink could go into your lungs. You could also burn yourself.  After 2 hours have passed and when you can cough and gag normally, you may eat soft food and drink liquids slowly.  The day after the test, you may eat your normal diet.  You may do your normal activities.  Keep all doctor visits. Get help right away if:  You get more and more short of breath.  You get light-headed.  You feel like you are going to pass out (faint).  You have chest pain.  You have new problems that worry you.  You cough up more than a little blood.  You cough up more blood than before.   Please call our office for any questions or concerns.  [phone].  This information is not intended to replace advice given to you by your health care provider. Make sure you discuss any questions you have with your health care provider. Document Released: 07/16/2009 Document Revised: 02/24/2016 Document Reviewed: 05/23/2013 Elsevier Interactive Patient Education  2017 [employer].

## 2017-09-05 NOTE — Op Note (Signed)
Video Bronchoscopy with Endobronchial Ultrasound and Electromagnetic Navigation Procedure Note  Date of Operation: 09/05/2017  Pre-op Diagnosis: Left upper lobe nodule, mediastinal lymphadenopathy  Post-op Diagnosis: Same  Surgeon: Baltazar Apo  Assistants: None  Anesthesia: General endotracheal anesthesia  Operation: Flexible video fiberoptic bronchoscopy with endobronchial ultrasound and biopsies.  Estimated Blood Loss: Minimal  Complications: None apparent  Indications and History: Nathaniel Long is a 76 y.o. male with history of tobacco use.  He had a left upper lobe nodule spuriously identified on chest x-ray and then CT scan of the chest.  The nodule and subcarinal lymph nodes were hypermetabolic on subsequent PET scan.  Recommendation was made to achieve tissue diagnosis via navigational bronchoscopy and endobronchial ultrasound with needle biopsies..  The risks, benefits, complications, treatment options and expected outcomes were discussed with the patient.  The possibilities of pneumothorax, pneumonia, reaction to medication, pulmonary aspiration, perforation of a viscus, bleeding, failure to diagnose a condition and creating a complication requiring transfusion or operation were discussed with the patient who freely signed the consent.    Description of Procedure: The patient was examined in the preoperative area and history and data from the preprocedure consultation were reviewed. It was deemed appropriate to proceed.  The patient was taken to OR 10, identified as Nathaniel Long and the procedure verified as Flexible Video Fiberoptic Bronchoscopy.  A Time Out was held and the above information confirmed. After being taken to the operating room general anesthesia was initiated and the patient  was orally intubated. The video fiberoptic bronchoscope was introduced via the endotracheal tube and a general inspection was performed which showed normal airways throughout.  No  endobronchial lesions or abnormal secretions seen. The standard scope was then withdrawn and the endobronchial ultrasound was used to identify and characterize the peritracheal, hilar and bronchial lymph nodes. Inspection showed normal 4 L, 10 R nodes.  There was enlargement of the 4R, 7, 11 L nodes. Using real-time ultrasound guidance Wang needle biopsies were take from [place], 7, 11 L nodes and were sent for cytology.   Attention was then turned to the navigational procedure.  Prior to the date of the procedure a high-resolution CT scan of the chest was performed. Utilizing [place] a virtual tracheobronchial tree was generated to allow the creation of distinct navigation pathways to the patient's left upper lobe abnormality. The extendable working channel and locator guide were introduced into the bronchoscope. The distinct navigation pathways prepared prior to this procedure were then utilized to navigate to within 0.5 cm of patient's lesion identified on CT scan. The extendable working channel was secured into place and the locator guide was withdrawn. Under fluoroscopic guidance transbronchial needle brushings, transbronchial Wang needle biopsies, and transbronchial forceps biopsies were performed to be sent for cytology and pathology. A bronchioalveolar lavage was performed in the left upper lobe and sent for cytology and microbiology (bacterial, fungal, AFB smears and cultures).  Under fluoroscopic and navigational guidance 3 fiducial markers were placed triangulating the left upper lobe nodule to facilitate possible radiation therapy in the future.  At the end of the procedure a general airway inspection was performed and there was no evidence of active bleeding. The bronchoscope was removed.  The patient tolerated the procedure well. There was no significant blood loss and there were no obvious complications. A post-procedural chest x-ray is pending.    Samples: 1. Wang needle  biopsies from 4R node 2. Wang needle biopsies from 7 node 3. Wang needle biopsies  from 11 L node 4. Transbronchial needle brushings from left upper lobe nodule 5. Transbronchial Wang needle biopsies from left upper lobe nodule 6. Transbronchial forceps biopsies from left upper lobe nodule 7. Bronchoalveolar lavage from left upper lobe    Plans:  The patient will be discharged from the PACU to home when recovered from anesthesia. We will review the cytology, pathology and microbiology results with the patient when they become available. Outpatient followup will be with Dr Vaughan Browner or Dr Lamonte Sakai.    Baltazar Apo, MD, PhD 09/05/2017, 10:27 AM [facility] [phone] or if no answer [phone]

## 2017-09-05 NOTE — Anesthesia Procedure Notes (Signed)
Procedure Name: Intubation Date/Time: 09/05/2017 8:40 AM Performed by: White, Amedeo Plenty, CRNA Pre-anesthesia Checklist: Patient identified, Emergency Drugs available, Suction available and Patient being monitored Patient Re-evaluated:Patient Re-evaluated prior to induction Oxygen Delivery Method: Circle System Utilized Preoxygenation: Pre-oxygenation with 100% oxygen Induction Type: IV induction Ventilation: Mask ventilation without difficulty Laryngoscope Size: Mac and 4 Grade View: Grade II Tube type: Oral Tube size: 8.5 mm Number of attempts: 1 Airway Equipment and Method: Stylet and Oral airway Placement Confirmation: ETT inserted through vocal cords under direct vision,  positive ETCO2 and breath sounds checked- equal and bilateral Secured at: 23 cm Tube secured with: Tape Dental Injury: Teeth and Oropharynx as per pre-operative assessment

## 2017-09-05 NOTE — Interval H&P Note (Signed)
PCCM Interval Note  Pt presents today for further w/u of his 1.3cm LUL nodule nad mediastinal LAD. He reports no new issues, still has trouble with upper chest and flank pain due to his fall. Also has compression fracture that is limiting him. Breathing is stable although he does have exertional SOB at times. No cough, hemoptysis, fevers.   Vitals:   09/05/17 0650  BP: (!) 147/70  Pulse: 62  Resp: 17  Temp: 97.7 F (36.5 C)  SpO2: 95%   Gen: Pleasant elderly man, well-nourished, in no distress,  normal affect  ENT: No lesions,  mouth clear,  oropharynx clear, no postnasal drip, VERY decreased hearing B, M3 airway  Neck: No JVD, no stridor  Lungs: No use of accessory muscles, distant but clear  Cardiovascular: RRR, heart sounds normal, no murmur or gallops, no peripheral edema  Musculoskeletal: No deformities, no cyanosis or clubbing  Neuro: alert, non focal, decreased hearing as above  Skin: Warm, no lesions or rashes   Impression / Plan: LUL nodule in a smoker with mediastinal LAD. Plan for navigational FOB to the nodule, Needle sampling of the mediastinum with EBUS. Procedure benefits and risks explained to the patient. He understands and agrees to proceed. \  Baltazar Apo, MD, PhD 09/05/2017, 8:20 AM [facility] [phone] or if no answer [phone]

## 2017-09-06 ENCOUNTER — Encounter (HOSPITAL_COMMUNITY): Payer: Self-pay | Admitting: Emergency Medicine

## 2017-09-06 LAB — ACID FAST SMEAR (AFB)

## 2017-09-06 LAB — ACID FAST SMEAR (AFB, MYCOBACTERIA): Acid Fast Smear: NEGATIVE

## 2017-09-06 NOTE — Anesthesia Postprocedure Evaluation (Signed)
Anesthesia Post Note  Patient: Nathaniel Long  Procedure(s) Performed: VIDEO BRONCHOSCOPY WITH ENDOBRONCHIAL NAVIGATION (N/A ) VIDEO BRONCHOSCOPY WITH ENDOBRONCHIAL ULTRASOUND (N/A )     Patient location during evaluation: PACU Anesthesia Type: General Level of consciousness: awake and alert Pain management: pain level controlled Vital Signs Assessment: post-procedure vital signs reviewed and stable Respiratory status: spontaneous breathing, nonlabored ventilation, respiratory function stable and patient connected to nasal cannula oxygen Cardiovascular status: blood pressure returned to baseline and stable Postop Assessment: no apparent nausea or vomiting Anesthetic complications: no    Last Vitals:  Vitals:   09/05/17 1123 09/05/17 1126  BP: 127/62 116/65  Pulse: 70 70  Resp: (!) 23 19  Temp:  (!) 36.3 C  SpO2: 92% 94%    Last Pain:  Vitals:   09/05/17 1126  PainSc: 0-No pain                 Halvor Behrend DAVID

## 2017-09-07 ENCOUNTER — Telehealth: Payer: Self-pay | Admitting: Pulmonary Disease

## 2017-09-07 DIAGNOSIS — R911 Solitary pulmonary nodule: Secondary | ICD-10-CM

## 2017-09-07 LAB — CULTURE, RESPIRATORY W GRAM STAIN: Gram Stain: NONE SEEN

## 2017-09-07 LAB — CULTURE, RESPIRATORY: CULTURE: NO GROWTH

## 2017-09-07 NOTE — Telephone Encounter (Signed)
PM pt is calling for his biopsy results.  Please advise. Thanks

## 2017-09-07 NOTE — Telephone Encounter (Signed)
Referral placed. Nothing further is needed.

## 2017-09-07 NOTE — Telephone Encounter (Signed)
I called and updated his wife about the biopsy results. Please make a referral to multidisciplinary oncology clinic. I will discuss further with them at time of clinic visit next week.  Marshell Garfinkel MD [facility] 09/07/2017, 12:47 PM

## 2017-09-11 ENCOUNTER — Telehealth: Payer: Self-pay | Admitting: *Deleted

## 2017-09-11 NOTE — Telephone Encounter (Signed)
Oncology Nurse Navigator Documentation  Oncology Nurse Navigator Flowsheets 09/11/2017  Navigator Location [facility]  Referral date to RadOnc/MedOnc 09/11/2017  Navigator Encounter Type Telephone/I received referral on Nathaniel Long. I called and scheduled him to be seen this week at [facility]    Telephone Outgoing Call  Treatment Phase Pre-Tx/Tx Discussion  Barriers/Navigation Needs Coordination of Care  Interventions Coordination of Care  Coordination of Care Appts  Acuity Level 1  Time Spent with Patient 30

## 2017-09-12 ENCOUNTER — Encounter: Payer: Self-pay | Admitting: Pulmonary Disease

## 2017-09-12 ENCOUNTER — Ambulatory Visit: Payer: Medicare Other | Admitting: Pulmonary Disease

## 2017-09-12 ENCOUNTER — Ambulatory Visit (INDEPENDENT_AMBULATORY_CARE_PROVIDER_SITE_OTHER): Payer: Medicare Other | Admitting: Pulmonary Disease

## 2017-09-12 VITALS — BP 124/68 | HR 65 | Ht 70.5 in | Wt 187.0 lb

## 2017-09-12 DIAGNOSIS — R911 Solitary pulmonary nodule: Secondary | ICD-10-CM

## 2017-09-12 DIAGNOSIS — R0602 Shortness of breath: Secondary | ICD-10-CM

## 2017-09-12 LAB — PULMONARY FUNCTION TEST
DL/VA % PRED: 52 %
DL/VA: 2.41 ml/min/mmHg/L
DLCO COR % PRED: 41 %
DLCO cor: 13.63 ml/min/mmHg
DLCO unc % pred: 40 %
DLCO unc: 13.35 ml/min/mmHg
FEF 25-75 POST: 0.93 L/s
FEF 25-75 PRE: 0.95 L/s
FEF2575-%Change-Post: -1 %
FEF2575-%PRED-PRE: 42 %
FEF2575-%Pred-Post: 42 %
FEV1-%Change-Post: 5 %
FEV1-%PRED-POST: 71 %
FEV1-%Pred-Pre: 68 %
FEV1-Post: 2.22 L
FEV1-Pre: 2.11 L
FEV1FVC-%Change-Post: 3 %
FEV1FVC-%PRED-PRE: 76 %
FEV6-%Change-Post: 0 %
FEV6-%PRED-POST: 92 %
FEV6-%Pred-Pre: 91 %
FEV6-POST: 3.7 L
FEV6-Pre: 3.68 L
FEV6FVC-%CHANGE-POST: -1 %
FEV6FVC-%PRED-POST: 101 %
FEV6FVC-%Pred-Pre: 102 %
FVC-%CHANGE-POST: 1 %
FVC-%PRED-POST: 90 %
FVC-%Pred-Pre: 89 %
FVC-Post: 3.89 L
FVC-Pre: 3.82 L
POST FEV1/FVC RATIO: 57 %
PRE FEV1/FVC RATIO: 55 %
Post FEV6/FVC ratio: 95 %
Pre FEV6/FVC Ratio: 96 %

## 2017-09-12 NOTE — Progress Notes (Signed)
Nathaniel Long    [id number]    10/31/1940  Primary Care Physician:Moreira, Carloyn Manner, MD  Referring Physician: [name], MD [address], [place] [address]  Chief complaint: Follow up for lung mass.  HPI: 76 year old with history of GERD, hypertension.  He had an evaluation in the ED on 10/15 after a fall with blunt chest trauma.  CT scan of the chest did not reveal any fracture.  But there was a left upper lobe spiculated nodule and he has been referred to pulmonary for further evaluation. Sternal fracture is being managed conservatively.  Noted to have slightly elevated troponin during that admission.  He was evaluated by his cardiologist Dr. Einar Gip with no concern for cardiac ischemia.  He was on Plavix for carotid stenosis.  This has been held and he was started on aspirin.  He underwent cardiac stress test no evidence of ischemia.  He has 60-pack-year smoking history.  Quit 3 months ago.  Interim history: Patient had a bronchoscope on 09/05/17 with results showing non-small cell lung cancer.  He is here for discussion of results and plan for further steps.  He is continuing to improve in terms of his respiratory status.  Denies any cough, sputum production, dyspnea, wheezing.  Outpatient Encounter Medications as of 09/12/2017  Medication Sig  . acetaminophen (TYLENOL) 325 MG tablet Take 1 tablet (325 mg total) by mouth every 6 (six) hours as needed for moderate pain or headache.  . ALPRAZolam (XANAX) 0.5 MG tablet Take 0.5 mg by mouth at bedtime.   Marland Kitchen amLODipine (NORVASC) 5 MG tablet Take 5 mg daily by mouth.  Marland Kitchen aspirin EC 81 MG tablet Take 1 tablet (81 mg total) by mouth daily.  . calcium carbonate (TUMS - DOSED IN MG ELEMENTAL CALCIUM) 500 MG chewable tablet Chew 1 tablet by mouth daily as needed for indigestion or heartburn.  . cetirizine (ZYRTEC) 10 MG tablet TAKE 1 TABLET BY MOUTH AT BEDTIME (Patient taking differently: Take 10 mg daily as needed for allergies)    . Cholecalciferol (VITAMIN D) 2000 units CAPS Take 4,000 Units by mouth every evening.  . clotrimazole (LOTRIMIN) 1 % cream Apply 1 application topically 2 (two) times daily.  . fish oil-omega-3 fatty acids 1000 MG capsule Take 1 g by mouth every evening.   . Multiple Vitamins-Minerals (CENTRUM PO) Take 1 tablet by mouth every evening.   . pantoprazole (PROTONIX) 40 MG tablet Take 40 mg by mouth at bedtime.   . Pitavastatin Calcium (LIVALO) 4 MG TABS Take 2 mg by mouth every evening.   . sodium chloride (OCEAN) 0.65 % SOLN nasal spray Place 1 spray into both nostrils as needed for congestion.  Marland Kitchen umeclidinium-vilanterol (ANORO ELLIPTA) 62.5-25 MCG/INH AEPB Inhale 1 puff daily into the lungs.   No facility-administered encounter medications on file as of 09/12/2017.     Allergies as of 09/12/2017 - Review Complete 09/12/2017  Allergen Reaction Noted  . Gabapentin Other (See Comments) 04/26/2017  . Statins Other (See Comments) 01/04/2017    Past Medical History:  Diagnosis Date  . Anxiety   . Carotid artery stenosis   . Chronic lower back pain   . CKD (chronic kidney disease)   . Complication of anesthesia    "can't pee when I come out of OR" (07/18/2017)  . Contact with chainsaw as cause of accidental injury 07/16/2017   sternal fracture with bilateral anterior fifth and sixth rib fractures /notes 07/16/2017  . Dyspnea   .  GERD (gastroesophageal reflux disease)   . High cholesterol   . Hypertension   . Thoracic ascending aortic aneurysm [facility])     Past Surgical History:  Procedure Laterality Date  . ABDOMINAL AORTOGRAM W/LOWER EXTREMITY N/A 05/01/2017   Procedure: Abdominal Aortogram w/Lower Extremity;  Surgeon: Adrian Prows, MD;  Location: [facility];  Service: Cardiovascular;  Laterality: N/A;  . BACK SURGERY    . FINGER REPLANTATION Left    "ring finger"  . HEMORRHOID SURGERY    . POSTERIOR LUMBAR FUSION  1986  . VIDEO BRONCHOSCOPY WITH ENDOBRONCHIAL NAVIGATION N/A  09/05/2017   Procedure: VIDEO BRONCHOSCOPY WITH ENDOBRONCHIAL NAVIGATION;  Surgeon: Collene Gobble, MD;  Location: [facility];  Service: Thoracic;  Laterality: N/A;  . VIDEO BRONCHOSCOPY WITH ENDOBRONCHIAL ULTRASOUND N/A 09/05/2017   Procedure: VIDEO BRONCHOSCOPY WITH ENDOBRONCHIAL ULTRASOUND;  Surgeon: Collene Gobble, MD;  Location: [facility];  Service: Thoracic;  Laterality: N/A;    Family History  Problem Relation Age of Onset  . Colon polyps Brother   . Dementia Sister     Social History   Socioeconomic History  . Marital status: Married    Spouse name: Not on file  . Number of children: Not on file  . Years of education: Not on file  . Highest education level: Not on file  Social Needs  . Financial resource strain: Not on file  . Food insecurity - worry: Not on file  . Food insecurity - inability: Not on file  . Transportation needs - medical: Not on file  . Transportation needs - non-medical: Not on file  Occupational History  . Not on file  Tobacco Use  . Smoking status: Former Smoker    Packs/day: 1.00    Years: 33.00    Pack years: 33.00    Types: Cigarettes  . Smokeless tobacco: Never Used  . Tobacco comment: 07/18/2017 "stopped ~several months ago"  Substance and Sexual Activity  . Alcohol use: Yes    Comment: 07/18/2017 "drank alot of beer; stopped in ~ 1993"  . Drug use: No  . Sexual activity: Not on file  Other Topics Concern  . Not on file  Social History Narrative  . Not on file    Review of systems: Review of Systems  Constitutional: Negative for fever and chills.  HENT: Negative.   Eyes: Negative for blurred vision.  Respiratory: as per HPI  Cardiovascular: Negative for chest pain and palpitations.  Gastrointestinal: Negative for vomiting, diarrhea, blood per rectum. Genitourinary: Negative for dysuria, urgency, frequency and hematuria.  Musculoskeletal: Negative for myalgias, back pain and joint pain.  Skin: Negative for itching and rash.    Neurological: Negative for dizziness, tremors, focal weakness, seizures and loss of consciousness.  Endo/Heme/Allergies: Negative for environmental allergies.  Psychiatric/Behavioral: Negative for depression, suicidal ideas and hallucinations.  All other systems reviewed and are negative.  Physical Exam: Blood pressure 124/68, pulse 65, height 5' 10.5" (1.791 m), weight 187 lb (84.8 kg), SpO2 98 %. Gen:      No acute distress HEENT:  EOMI, sclera anicteric Neck:     No masses; no thyromegaly Lungs:    Clear to auscultation bilaterally; normal respiratory effort CV:         Regular rate and rhythm; no murmurs Abd:      + bowel sounds; soft, non-tender; no palpable masses, no distension Ext:    No edema; adequate peripheral perfusion Skin:      Warm and dry; no rash Neuro: alert  and oriented x 3 Psych: normal mood and affect  Data Reviewed: CT chest 07/16/17-upper sternal fracture with moderate hematoma in the anterior mediastinum, moderate emphysema 16 mm spiculated left upper lobe mass.  Ascending aorta aneurysm, lower lobe consolidation.  PET scan 08/10/17-right upper lobe 1.6 cm mass which is hypermetabolic.  Low FDG uptake in the subcarinal lymph node. New small bilateral effusion. I have reviewed all images personally.  Lung biopsy 09/05/17-.  Left upper lobe brushing and biopsy is positive for non-small cell lung cancer, adenocarcinoma Needle aspiration of lymph nodes 11 L, 7, 4R-negative for malignancy.  PFTs 09/12/17 FVC 3.89 [90%], FEV1 2.22 (71%], F/F 57, TLC 75%, DLCO 40% Moderate obstruction with minimal restriction and severe diffusion defect.  Assessment:  Left upper lobe lung mass Biopsy confirms non small cell cancer, adenocarcinoma.  He may have stage I disease as lymph node sampling was negative on EBUS.  The low uptake in the subcarinal lymph node on PET scan could have been from nonspecific inflammation from recent fall. He is scheduled to be evaluated at the  multidisciplinary oncology clinic for further management.  Small pleural effusions Not likely to be malignant.  Effusions are probably from his recent fall.  They are too small to access by thoracentesis.  We will continue to monitor.   COPD PFTs reviewed showing moderate obstruction with severe diffusion defect.  He was walked in the clinic today with no desaturations noted. No need for inhalers as patient is not symptomatic.  More then 1/2 the time of the 40 min visit was spent in counseling and/or coordination of care with the patient and family.  Plan/Recommendations: - Multidisciplinary oncology evaluation - Follow up in 3 months  Marshell Garfinkel MD [facility] Pager [phone] 09/12/2017, 11:45 AM  CC: [name], MD

## 2017-09-12 NOTE — Patient Instructions (Signed)
I have discussed the results of your lung biopsy which shows that you have lung cancer We had referred you to oncology clinic for further evaluation and discussion of treatment options Follow-up in 3 months.

## 2017-09-12 NOTE — Progress Notes (Signed)
PFT done today. 

## 2017-09-13 ENCOUNTER — Ambulatory Visit
Admission: RE | Admit: 2017-09-13 | Discharge: 2017-09-13 | Disposition: A | Discharge status: Home / Self Care | Payer: Medicare Other | Source: Ambulatory Visit | Attending: Radiation Oncology | Admitting: Radiation Oncology

## 2017-09-13 ENCOUNTER — Ambulatory Visit: Payer: Medicare Other | Attending: Radiation Oncology | Admitting: Physical Therapy

## 2017-09-13 ENCOUNTER — Other Ambulatory Visit: Payer: Self-pay

## 2017-09-13 ENCOUNTER — Ambulatory Visit (INDEPENDENT_AMBULATORY_CARE_PROVIDER_SITE_OTHER): Payer: Medicare Other | Admitting: Thoracic Surgery (Cardiothoracic Vascular Surgery)

## 2017-09-13 ENCOUNTER — Encounter: Payer: Self-pay | Admitting: *Deleted

## 2017-09-13 ENCOUNTER — Telehealth: Payer: Self-pay | Admitting: *Deleted

## 2017-09-13 ENCOUNTER — Encounter: Payer: Self-pay | Admitting: Radiation Oncology

## 2017-09-13 VITALS — BP 122/65 | HR 65 | Temp 98.4°F | Resp 18 | Wt 189.2 lb

## 2017-09-13 DIAGNOSIS — G8929 Other chronic pain: Secondary | ICD-10-CM | POA: Diagnosis present

## 2017-09-13 DIAGNOSIS — M545 Low back pain, unspecified: Secondary | ICD-10-CM

## 2017-09-13 DIAGNOSIS — C3492 Malignant neoplasm of unspecified part of left bronchus or lung: Secondary | ICD-10-CM | POA: Diagnosis present

## 2017-09-13 DIAGNOSIS — C3412 Malignant neoplasm of upper lobe, left bronchus or lung: Secondary | ICD-10-CM

## 2017-09-13 HISTORY — DX: Unspecified hearing loss, unspecified ear: H91.90

## 2017-09-13 NOTE — Therapy (Signed)
[place], [place], [address] Phone: [phone]   Fax:  [phone]  Physical Therapy Evaluation  Patient Details  Name: Nathaniel Long MRN: [id number] Date of Birth: Feb 19, 1941 Referring Provider: Dr. Modesto Charon   Encounter Date: 09/13/2017  PT End of Session - 09/13/17 1604    Visit Number  1    Number of Visits  1    PT Start Time  1324    PT Stop Time  1540    PT Time Calculation (min)  24 min    Activity Tolerance  Patient tolerated treatment well    Behavior During Therapy  [facility] for tasks assessed/performed       Past Medical History:  Diagnosis Date  . Anxiety   . Carotid artery stenosis   . Chronic lower back pain   . CKD (chronic kidney disease)   . Complication of anesthesia    "can't pee when I come out of OR" (07/18/2017)  . Contact with chainsaw as cause of accidental injury 07/16/2017   sternal fracture with bilateral anterior fifth and sixth rib fractures /notes 07/16/2017  . Dyspnea   . GERD (gastroesophageal reflux disease)   . Hard of hearing   . High cholesterol   . Hypertension   . Thoracic ascending aortic aneurysm [facility])     Past Surgical History:  Procedure Laterality Date  . ABDOMINAL AORTOGRAM W/LOWER EXTREMITY N/A 05/01/2017   Procedure: Abdominal Aortogram w/Lower Extremity;  Surgeon: Adrian Prows, MD;  Location: [facility];  Service: Cardiovascular;  Laterality: N/A;  . BACK SURGERY    . FINGER REPLANTATION Left    "ring finger"  . HEMORRHOID SURGERY    . POSTERIOR LUMBAR FUSION  1986  . VIDEO BRONCHOSCOPY WITH ENDOBRONCHIAL NAVIGATION N/A 09/05/2017   Procedure: VIDEO BRONCHOSCOPY WITH ENDOBRONCHIAL NAVIGATION;  Surgeon: Collene Gobble, MD;  Location: [facility];  Service: Thoracic;  Laterality: N/A;  . VIDEO BRONCHOSCOPY WITH ENDOBRONCHIAL ULTRASOUND N/A 09/05/2017   Procedure: VIDEO BRONCHOSCOPY WITH ENDOBRONCHIAL ULTRASOUND;  Surgeon: Collene Gobble, MD;  Location: [facility];  Service: Thoracic;  Laterality: N/A;    There were no vitals filed for this visit.   Subjective Assessment - 09/13/17 1543    Subjective  I'm working all the time out in the yard when it's not snowing. I can't wait to go to bed at night so I can get up in the morning and go. Patient's wife says that he is due to have his back treated, but this is now on hold due to current lung diagnosis.    Patient is accompained by:  Family member wife    Pertinent History  Incidental finding of left upper lobe nodule when being worked up after a fall.  Had CT, PET, bronchoscopy.  Diagnosis is non-small cell lung cancer that suggests adenocarcinoma.  He expects to have resection.  Ex-smoker, atherosclerosis, dyspnea, CKD, HTN, chronic LBP with h/o lumbar fusion 1986, PAD,left finger replantation.    Patient Stated Goals  get info from all lung clinic providers    Currently in Pain?  No/denies but back pain can act up anytime         [facility] PT Assessment - 09/13/17 0001      Assessment   Medical Diagnosis  LUL NSCLC    Referring Provider  Dr. Modesto Charon    Onset Date/Surgical Date  07/16/17    Hand Dominance  Right    Prior Therapy  none      Precautions   Precaution Comments  HOH, cancer precautions      Restrictions   Weight Bearing Restrictions  No      Balance Screen   Has the patient fallen in the past 6 months  Yes    How many times?  1 with chainsaw; this led to current diagnosis    Has the patient had a decrease in activity level because of a fear of falling?   No    Is the patient reluctant to leave their home because of a fear of falling?   No      Home Film/video editor residence    Living Arrangements  Spouse/significant other    Type of [place]  Two level      Prior Function   Level of [place]  does a lot of work in the yard      Cognition   Overall Cognitive  Status  Within Functional Limits for tasks assessed      Observation/Other Assessments   Observations  Pt. leans in close to be able to hear what you're saying      Functional Tests   Functional tests  Sit to Stand      Sit to Stand   Comments  12 times in 30 seconds, just above average for age mod. dypnea following; says he was holding his breath      Posture/Postural Control   Posture/Postural Control  No significant limitations      ROM / Strength   AROM / PROM / Strength  AROM      AROM   Overall AROM Comments  standing trunk AROM all directions WFL except forward bend--reaches only approx. 12 inches fingertips to floor      Ambulation/Gait   Ambulation/Gait  Yes    Ambulation/Gait Assistance  7: Independent      Balance   Balance Assessed  Yes      Dynamic Standing Balance   Dynamic Standing - Comments  reaches forward 15 inches in standing, just above average for age             Objective measurements completed on examination: See above findings.              PT Education - 09/13/17 1603    Education provided  Yes    Education Details  walking, CURE article on staying active, "Why exercise?" flyer, info on Prehab exercise class, posture, breathing, cough splinting, PT info    Person(s) Educated  Patient;Spouse    Methods  Explanation;Handout    Comprehension  Verbalized understanding            Lung Clinic Goals - 09/13/17 1630      Patient will be able to verbalize understanding of the benefit of exercise to decrease fatigue.   Status  Achieved      Patient will be able to verbalize the importance of posture.   Status  Achieved      Patient will be able to demonstrate diaphragmatic breathing for improved lung function.   Status  Achieved      Patient will be able to verbalize understanding of the role of physical therapy to prevent functional decline and who to contact if physical therapy is needed.   Status  Achieved            Plan - 09/13/17 8295  Clinical Impression Statement  Pleasant but very hard of hearing gentleman who is active and did well on assessments today, other than trunk flexion; he has a h/o back fusion 1986 and more recent back problems at the level above that. He currently has a left upper lobe nodule and is expected to undergo resection of that.     History and Personal Factors relevant to plan of care:  chronic LBP, dyspnea    Clinical Presentation  Evolving    Clinical Presentation due to:  is to undergo lung cancer resection soon    Clinical Decision Making  Low    Rehab Potential  Good    PT Frequency  One time visit    PT Treatment/Interventions  Patient/family education    PT Next Visit Plan  No follow-up planned; patient was given info on Prehab exercise class.    PT Home Exercise Plan  walking, breathing, posture    Consulted and Agree with Plan of Care  Patient       Patient will benefit from skilled therapeutic intervention in order to improve the following deficits and impairments:  Cardiopulmonary status limiting activity  Visit Diagnosis: Non-small cell cancer of left lung (HCC) - Plan: PT plan of care cert/re-cert  Chronic midline low back pain without sciatica - Plan: PT plan of care cert/re-cert  G-Codes - 88/50/27 1630    Functional Assessment Tool Used (Outpatient Only)  clinical judgement    Functional Limitation  Mobility: Walking and moving around    Mobility: Walking and Moving Around Current Status ([id number])  At least 1 percent but less than 20 percent impaired, limited or restricted    Mobility: Walking and Moving Around Goal Status [phone])  At least 1 percent but less than 20 percent impaired, limited or restricted    Mobility: Walking and Moving Around Discharge Status [phone])  At least 1 percent but less than 20 percent impaired, limited or restricted        Problem List Patient Active Problem List   Diagnosis Date Noted  . Malignant neoplasm  of upper lobe of left lung ([place]) 09/13/2017  . Pulmonary nodule, left 09/05/2017  . Mediastinal lymphadenopathy 09/05/2017  . Multiple rib fractures 07/16/2017  . Spondylolisthesis, lumbar region 06/18/2017  . Right hip pain 05/23/2017  . Chronic bilateral low back pain with bilateral sciatica 05/23/2017  . Colon cancer screening 01/12/2017  . Antiplatelet or antithrombotic long-term use 01/12/2017  . PAD (peripheral artery disease) ([place]) 04/13/2011  . Murmur, heart 12/08/2010  . ACTINIC KERATOSIS, HEAD 07/14/2010  . BRUISED ANKLE 07/14/2010  . HYPERTENSION, BENIGN 11/19/2009  . TOBACCO ABUSE 10/28/2009  . EAR PAIN, LEFT 10/28/2009    Londa Mackowski 09/13/2017, 4:32 PM  [place], [place], [address] Phone: [phone]   Fax:  [phone]  Name: Nathaniel Long MRN: [id number] Date of Birth: 1940-10-20  Serafina Royals, PT 09/13/17 4:32 PM

## 2017-09-13 NOTE — H&P (View-Only) (Signed)
PCP is [name], MD Referring Provider is Marshell Garfinkel, MD  No chief complaint on file.   HPI: 76 yo man with newly diagnosed left upper lobe non-small cell carcinoma.  Mr. Talarico is a 76 yo man with a past history of back pain with sciatica, carotid stenosis, PAD, GERD, hearing loss, ascending aneurysm and CKD. He has a 60 py history of smoking before quitting in July 2018. He recently was working with a chain saw when either the saw kicked back or a limb hit him in the chest. 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 A PET showed moderate uptake with an SUV of 5.5. There as borderline uptake in the hilar and mediastinal lymph nodes, particularly the subcarinal node. Dr. Lamonte Sakai did an ENB and EBUS. 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 Lymph node aspirations showed lymphocytes but no tumor was seen.  He has pain from his recent injuries, but denies any other chest pain. He denies dyspnea and wheezing. No change in appetite, weight loss, headaches or visual changes.   Zubrod Score: At the time of surgery this patient's most appropriate activity status/level should be described as: [x]     0    Normal activity, no symptoms []     1    Restricted in physical strenuous activity but ambulatory, able to do out light work []     2    Ambulatory and capable of self care, unable to do work activities, up and about >50 % of waking hours                              []     3    Only limited self care, in bed greater than 50% of waking hours []     4    Completely disabled, no self care, confined to bed or chair []     5    Moribund     Past Medical History:  Diagnosis Date  . Anxiety   . Carotid artery stenosis   . Chronic lower back pain   . CKD (chronic kidney disease)   . Complication of anesthesia    "can't pee when I come out of OR" (07/18/2017)  . Contact with chainsaw as cause of accidental  injury 07/16/2017   sternal fracture with bilateral anterior fifth and sixth rib fractures /notes 07/16/2017  . Dyspnea   . GERD (gastroesophageal reflux disease)   . Hard of hearing   . High cholesterol   . Hypertension   . Thoracic ascending aortic aneurysm [facility])     Past Surgical History:  Procedure Laterality Date  . ABDOMINAL AORTOGRAM W/LOWER EXTREMITY N/A 05/01/2017   Procedure: Abdominal Aortogram w/Lower Extremity;  Surgeon: Adrian Prows, MD;  Location: [facility];  Service: Cardiovascular;  Laterality: N/A;  . BACK SURGERY    . FINGER REPLANTATION Left    "ring finger"  . HEMORRHOID SURGERY    . POSTERIOR LUMBAR FUSION  1986  . VIDEO BRONCHOSCOPY WITH ENDOBRONCHIAL NAVIGATION N/A 09/05/2017   Procedure: VIDEO BRONCHOSCOPY WITH ENDOBRONCHIAL NAVIGATION;  Surgeon: Collene Gobble, MD;  Location: [facility];  Service: Thoracic;  Laterality: N/A;  . VIDEO BRONCHOSCOPY WITH ENDOBRONCHIAL ULTRASOUND N/A 09/05/2017   Procedure: VIDEO BRONCHOSCOPY WITH ENDOBRONCHIAL ULTRASOUND;  Surgeon: Collene Gobble, MD;  Location: [facility];  Service: Thoracic;  Laterality: N/A;  Family History  Problem Relation Age of Onset  . Colon polyps Brother   . Dementia Sister     Social History Social History   Tobacco Use  . Smoking status: Former Smoker    Packs/day: 1.00    Years: 33.00    Pack years: 33.00    Types: Cigarettes  . Smokeless tobacco: Never Used  . Tobacco comment: 07/18/2017 "stopped ~several months ago"  Substance Use Topics  . Alcohol use: Yes    Comment: 07/18/2017 "drank alot of beer; stopped in ~ 1993"  . Drug use: No    Current Outpatient Medications  Medication Sig Dispense Refill  . acetaminophen (TYLENOL) 325 MG tablet Take 1 tablet (325 mg total) by mouth every 6 (six) hours as needed for moderate pain or headache.    . ALPRAZolam (XANAX) 0.5 MG tablet Take 0.5 mg by mouth at bedtime.     Marland Kitchen amLODipine (NORVASC) 5 MG tablet Take 5 mg daily by mouth.  0  .  aspirin EC 81 MG tablet Take 1 tablet (81 mg total) by mouth daily. 30 tablet 2  . calcium carbonate (TUMS - DOSED IN MG ELEMENTAL CALCIUM) 500 MG chewable tablet Chew 1 tablet by mouth daily as needed for indigestion or heartburn.    . cetirizine (ZYRTEC) 10 MG tablet TAKE 1 TABLET BY MOUTH AT BEDTIME (Patient taking differently: Take 10 mg daily as needed for allergies) 31 tablet 3  . Cholecalciferol (VITAMIN D) 2000 units CAPS Take 4,000 Units by mouth every evening.    . clotrimazole (LOTRIMIN) 1 % cream Apply 1 application topically 2 (two) times daily.    . fish oil-omega-3 fatty acids 1000 MG capsule Take 1 g by mouth every evening.     . Multiple Vitamins-Minerals (CENTRUM PO) Take 1 tablet by mouth every evening.     . pantoprazole (PROTONIX) 40 MG tablet Take 40 mg by mouth at bedtime.     . Pitavastatin Calcium (LIVALO) 4 MG TABS Take 2 mg by mouth every evening.     . sodium chloride (OCEAN) 0.65 % SOLN nasal spray Place 1 spray into both nostrils as needed for congestion.    Marland Kitchen umeclidinium-vilanterol (ANORO ELLIPTA) 62.5-25 MCG/INH AEPB Inhale 1 puff daily into the lungs. 60 each 3   No current facility-administered medications for this visit.     Allergies  Allergen Reactions  . Gabapentin Other (See Comments)    dizziness  . Statins Other (See Comments)    Muscle weakness    Review of Systems  Constitutional: Negative for activity change, appetite change, fever and unexpected weight change.  HENT: Positive for hearing loss (severe). Negative for trouble swallowing and voice change.   Respiratory: Positive for cough. Negative for shortness of breath and wheezing.   Cardiovascular: Positive for chest pain (just related to recent trauma). Negative for palpitations and leg swelling.  Gastrointestinal: Negative for abdominal pain and blood in stool.  Genitourinary: Positive for difficulty urinating and frequency.  Musculoskeletal: Positive for arthralgias, back pain and joint  swelling.       Hip pain- sciatica  Neurological: Negative for seizures, syncope and weakness.  Hematological: Negative for adenopathy. Does not bruise/bleed easily.    BP 122/65   Pulse 65   Temp 98.4 F (36.9 C)   Resp 18   Wt 189 lb 3.2 oz (85.8 kg)   SpO2 99%   BMI 26.76 kg/m  Physical Exam  Constitutional: He is oriented to person, place, and time. He  appears well-developed and well-nourished. No distress.  HENT:  Head: Normocephalic and atraumatic.  Mouth/Throat: No oropharyngeal exudate.  Extremely HOH  Eyes: Conjunctivae and EOM are normal. No scleral icterus.  Neck: No thyromegaly present.  + bruits  Cardiovascular: Normal rate and regular rhythm.  Murmur (2/6 systolic) heard. Pulmonary/Chest: Effort normal and breath sounds normal. No respiratory distress. He has no wheezes. He has no rales.  Tender over sternum  Abdominal: Soft. He exhibits no distension. There is no tenderness.  Musculoskeletal: He exhibits no edema.  Lymphadenopathy:    He has no cervical adenopathy.  Neurological: He is alert and oriented to person, place, and time. No cranial nerve deficit. He exhibits normal muscle tone.  Skin: Skin is warm and dry.  Vitals reviewed.    Diagnostic Tests: CT CHEST WITHOUT CONTRAST  TECHNIQUE: Multidetector CT imaging of the chest was performed using thin slice collimation for electromagnetic bronchoscopy planning purposes, without intravenous contrast.  COMPARISON:  Chest CT 07/16/2017 and PET-CT 08/10/2017  FINDINGS: Cardiovascular: The heart is normal in size. No pericardial effusion. Stable mild tortuosity, ectasia and calcification of the thoracic aorta. Stable coronary artery calcifications.  Mediastinum/Nodes: Small scattered mediastinal and hilar lymph nodes are stable. No change since prior study.  Lungs/Pleura: Spiculated 13 x 13 mm left upper lobe pulmonary nodule which was metabolically active on the PET scan and consistent  with primary lung neoplasm. No other pulmonary lesions are identified. There are emphysematous changes and pulmonary scarring. There are also bilateral pleural effusions.  Upper Abdomen: No significant upper abdominal findings. No hepatic or adrenal gland lesions.  Musculoskeletal: No chest wall mass, supraclavicular or axillary adenopathy. Numerous healing rib fractures are noted bilaterally along with sternal fractures. These appears stable.  IMPRESSION: 1. 13 x 13 mm left upper lobe pulmonary lesion consistent with primary lung neoplasm. 2. Stable mediastinal and hilar lymph nodes. 3. Emphysematous changes and pulmonary scarring. 4. Remote posttraumatic changes with healing rib and sternal fractures and loculated pleural fluid collections.  Aortic Atherosclerosis (ICD10-I70.0) and Emphysema (ICD10-J43.9).   Electronically Signed   By: Marijo Sanes M.D.   On: 08/17/2017 13:21 NUCLEAR MEDICINE PET SKULL BASE TO THIGH  TECHNIQUE: 9.5 mCi F-18 FDG was injected intravenously. Full-ring PET imaging was performed from the skull base to thigh after the radiotracer. CT data was obtained and used for attenuation correction and anatomic localization.  FASTING BLOOD GLUCOSE:  Value: 110 mg/dl  COMPARISON:  CT on 07/16/2017  FINDINGS: NECK:  No hypermetabolic lymph nodes or masses.  CHEST: 10 mm subcarinal mediastinal lymph node shows mild FDG uptake, with SUV max of 3.6. No other hypermetabolic lymph nodes identified within the thorax.  A persistent spiculated pulmonary nodule is seen in the anterior left upper lobe measuring 16 mm, which shows FDG uptake with SUV max of 5.5. No other suspicious pulmonary nodules or masses are identified by CT.  New small bilateral multilocular pleural effusions are seen, without abnormal pleural FDG uptake.  ABDOMEN/PELVIS: No abnormal hypermetabolic activity within the liver, pancreas, adrenal glands, or spleen. No  hypermetabolic lymph nodes in the abdomen or pelvis. Aortic atherosclerosis.  SKELETON: No suspicious bone lesions to suggest skeletal metastasis. Hypermetabolic activity is seen at sites of multiple bilateral rib and sternal fractures seen on CT images, which do not appear pathologic.  IMPRESSION: 1.6 cm hypermetabolic spiculated pulmonary nodule in anterior left upper lobe, highly suspicious for primary bronchogenic carcinoma.  10 mm subcarinal mediastinal lymph node shows FDG uptake, suspicious for lymph node metastasis.  No evidence of metastatic disease within the neck, abdomen, or pelvis.  New small bilateral multilocular pleural effusions, likely posttraumatic in etiology. Multiple healing bilateral rib and sternal fractures, which do not appear pathologic.   Electronically Signed   By: Earle Gell M.D.   On: 08/10/2017 14:31]  PFT FVC= 3.82 (89%) FEV1= 2.11 (68%) FEV1= 2.22 (71%) post bronchodilator DLCO= 13.63 (41%)   Impression: 76 yo man with a long standing history of tobacco abuse who was incidentally found to have a 1.6 cm spiculated nodule in the left upper lobe on CT done for trauma. Biopsy showed non-small cell carcinoma. He had some borderline mediastinal adenopathy on PET but asprations of the nodes were negative for tumor.  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 Options for treatment include surgical resection and radiation. I reviewed the films with Mr and Mrs Wenke. We discussed the relative advantages and disadvantages of radiation and surgery. He strongly favors surgery.  I discussed the general nature of the procedure, the need for general anesthesia,the incisions to be used and the use of a drainage tube postoperatively with Mr and Mrs Spark. We discussed the expected hospital stay, overall recovery and short and long term outcomes. I informed them of the indications, risks, benefits and alternatives. They understand the risks include but are not  limited to death, stroke, MI, DVT/PE, bleeding, possible need for transfusion, infections, cardiac arrhythmias, prolonged air leak, as well as other unforeseeableI complications.  He accepts the risks and agrees to proceed.  Plan:  Left VATS, left upper lobectomy on Wed 09/19/2017  Melrose Nakayama, MD [facility] [phone]

## 2017-09-13 NOTE — Progress Notes (Signed)
Radiation Oncology         [phone] ________________________________  Name: Nathaniel Long        MRN: [id number]  Date of Service: 09/13/2017 DOB: Mar 05, 1941  CC:Nathaniel Panda, MD  Nathaniel Garfinkel, MD     REFERRING PHYSICIAN: Marshell Garfinkel, MD   DIAGNOSIS: The encounter diagnosis was Malignant neoplasm of upper lobe of left lung ([place]).   HISTORY OF PRESENT ILLNESS: Nathaniel Long is a 76 y.o. male seen at the request of Dr. Vaughan Long for a new diagnosis of lung cancer. The patient was seen and evaluated after he was hit in the chest when his chainsaw kicked back at him on 07/16/17. He was seen in the ED and a CXR revealed a displaced right 6th rib fracture and probable adjacent 7th rib fracture. A CT chest without contrast was performed and revealed dilitation of his ascending aorta to 4.3 cm, and a spiculated 16 x 13 mm left upper lobe mass, mildly comminuted fracture of the upper sternal body and extension to the sternal manubrial articulation, a 24 mm displaced bone fragment at the site of the fracture, mildly comminuted and displaced fracture of the mid sternal body with extension to the sternal costal junction bilaterally, and acute mildly displaced right anterior 3rd and 4th, 5th, 6th, and 7th rib fractures. A left 5th, 6th, 7th, and 8th rib fractures were also seen anteriorly. He met with Dr. Vaughan Long and underwent PET scan on 08/10/17 revealing a 10 mm subcarinal node with an SUV of 3.6, and persistent spiculated pulmonary nodule in the left upper lobe measuring 16 mm with an SUV of 5.5. New small effusions bilaterally were seen without FDG uptake. No suspicious metastatic lesions are otherwise noted. His rib fracture sites did not appear to be pathologic. He underwent super D scan and subsequent navigational bronchoscopy with Dr. Lamonte Long on 09/05/17 and pathology from brushings indicated a NSCLC, with staining pattern suspicious for adenocarcinoma, his sampled nodes were negative and per  pathology contained negative lymphoid tissue. He comes today to discuss options for his treatment.    PREVIOUS RADIATION THERAPY: No   PAST MEDICAL HISTORY:  Past Medical History:  Diagnosis Date  . Anxiety   . Carotid artery stenosis   . Chronic lower back pain   . CKD (chronic kidney disease)   . Complication of anesthesia    "can't pee when I come out of OR" (07/18/2017)  . Contact with chainsaw as cause of accidental injury 07/16/2017   sternal fracture with bilateral anterior fifth and sixth rib fractures /notes 07/16/2017  . Dyspnea   . GERD (gastroesophageal reflux disease)   . High cholesterol   . Hypertension   . Thoracic ascending aortic aneurysm ([place])        PAST SURGICAL HISTORY: Past Surgical History:  Procedure Laterality Date  . ABDOMINAL AORTOGRAM W/LOWER EXTREMITY N/A 05/01/2017   Procedure: Abdominal Aortogram w/Lower Extremity;  Surgeon: Nathaniel Prows, MD;  Location: [place] [facility];  Service: Cardiovascular;  Laterality: N/A;  . BACK SURGERY    . FINGER REPLANTATION Left    "ring finger"  . HEMORRHOID SURGERY    . POSTERIOR LUMBAR FUSION  1986  . VIDEO BRONCHOSCOPY WITH ENDOBRONCHIAL NAVIGATION N/A 09/05/2017   Procedure: VIDEO BRONCHOSCOPY WITH ENDOBRONCHIAL NAVIGATION;  Surgeon: Nathaniel Gobble, MD;  Location: [facility];  Service: Thoracic;  Laterality: N/A;  . VIDEO BRONCHOSCOPY WITH ENDOBRONCHIAL ULTRASOUND N/A 09/05/2017   Procedure: VIDEO BRONCHOSCOPY WITH ENDOBRONCHIAL ULTRASOUND;  Surgeon: Nathaniel Gobble,  MD;  Location: [facility];  Service: Thoracic;  Laterality: N/A;     FAMILY HISTORY:  Family History  Problem Relation Age of Onset  . Colon polyps Brother   . Dementia Sister      SOCIAL HISTORY:  reports that he has quit smoking. His smoking use included cigarettes. He has a 33.00 pack-year smoking history. he has never used smokeless tobacco. He reports that he drinks alcohol. He reports that he does not use drugs. The patient is married and  lives in [place]. 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 He is very hard of hearing.   ALLERGIES: Gabapentin and Statins   MEDICATIONS:  Current Outpatient Medications  Medication Sig Dispense Refill  . acetaminophen (TYLENOL) 325 MG tablet Take 1 tablet (325 mg total) by mouth every 6 (six) hours as needed for moderate pain or headache.    . ALPRAZolam (XANAX) 0.5 MG tablet Take 0.5 mg by mouth at bedtime.     Nathaniel Long amLODipine (NORVASC) 5 MG tablet Take 5 mg daily by mouth.  0  . aspirin EC 81 MG tablet Take 1 tablet (81 mg total) by mouth daily. 30 tablet 2  . calcium carbonate (TUMS - DOSED IN MG ELEMENTAL CALCIUM) 500 MG chewable tablet Chew 1 tablet by mouth daily as needed for indigestion or heartburn.    . cetirizine (ZYRTEC) 10 MG tablet TAKE 1 TABLET BY MOUTH AT BEDTIME (Patient taking differently: Take 10 mg daily as needed for allergies) 31 tablet 3  . Cholecalciferol (VITAMIN D) 2000 units CAPS Take 4,000 Units by mouth every evening.    . clotrimazole (LOTRIMIN) 1 % cream Apply 1 application topically 2 (two) times daily.    . fish oil-omega-3 fatty acids 1000 MG capsule Take 1 g by mouth every evening.     . Multiple Vitamins-Minerals (CENTRUM PO) Take 1 tablet by mouth every evening.     . pantoprazole (PROTONIX) 40 MG tablet Take 40 mg by mouth at bedtime.     . Pitavastatin Calcium (LIVALO) 4 MG TABS Take 2 mg by mouth every evening.     . sodium chloride (OCEAN) 0.65 % SOLN nasal spray Place 1 spray into both nostrils as needed for congestion.    Nathaniel Long umeclidinium-vilanterol (ANORO ELLIPTA) 62.5-25 MCG/INH AEPB Inhale 1 puff daily into the lungs. 60 each 3   No current facility-administered medications for this encounter.      REVIEW OF SYSTEMS: On review of systems, the patient reports that he is doing well overall. He reports discomfort in the chest from his initial accident. He notes an cough for several days following the biopsy, but this has resolved.  He denies any chest pain, shortness of breath, cough, fevers, chills, night sweats, unintended weight changes. He denies any bowel or bladder disturbances, and denies abdominal pain, nausea or vomiting. He denies any new musculoskeletal or joint aches or pains. A complete review of systems is obtained and is otherwise negative.     PHYSICAL EXAM:  Wt Readings from Last 3 Encounters:  09/12/17 187 lb (84.8 kg)  09/03/17 190 lb 1.6 oz (86.2 kg)  08/13/17 189 lb (85.7 kg)   Temp Readings from Last 3 Encounters:  09/05/17 (!) 97.4 F (36.3 C)  09/03/17 97.6 F (36.4 C)  07/21/17 98.4 F (36.9 C) (Oral)   BP Readings from Last 3 Encounters:  09/12/17 124/68  09/05/17 116/65  09/03/17 124/77   Pulse Readings from Last 3 Encounters:  09/12/17 65  09/05/17 70  09/03/17 71    In general this is a well appearing caucasian gentlemad in no acute distress. He's alert and oriented x4 and appropriate throughout the examination. Cardiopulmonary assessment is negative for acute distress and he exhibits normal effort.    ECOG = 0  0 - Asymptomatic (Fully active, able to carry on all predisease activities without restriction)  1 - Symptomatic but completely ambulatory (Restricted in physically strenuous activity but ambulatory and able to carry out work of a light or sedentary nature. For example, light housework, office work)  2 - Symptomatic, <50% in bed during the day (Ambulatory and capable of all self care but unable to carry out any work activities. Up and about more than 50% of waking hours)  3 - Symptomatic, >50% in bed, but not bedbound (Capable of only limited self-care, confined to bed or chair 50% or more of waking hours)  4 - Bedbound (Completely disabled. Cannot carry on any self-care. Totally confined to bed or chair)  5 - Death   Eustace Pen MM, Creech RH, Tormey DC, et al. [phone]). "Toxicity and response criteria of the [facility]". [place] Oncol. 5  (6): 649-55    LABORATORY DATA:  Lab Results  Component Value Date   WBC 11.4 (H) 09/03/2017   HGB 12.6 (L) 09/03/2017   HCT 37.3 (L) 09/03/2017   MCV 92.8 09/03/2017   PLT 402 (H) 09/03/2017   Lab Results  Component Value Date   NA 138 09/03/2017   K 4.2 09/03/2017   CL 108 09/03/2017   CO2 23 09/03/2017   Lab Results  Component Value Date   ALT 15 02/21/2012   AST 19 02/21/2012   ALKPHOS 73 02/21/2012   BILITOT 0.9 02/21/2012      RADIOGRAPHY: Dg Chest Port 1 View  Result Date: 09/05/2017 CLINICAL DATA:  Status post bronchoscopy with biopsies today. Left upper lobe pulmonary nodule. EXAM: PORTABLE CHEST 1 VIEW COMPARISON:  CT chest 08/17/2017. FINDINGS: There is no pneumothorax after bronchoscopy. The lungs are emphysematous with basilar atelectasis or scar. Fiducial markers in the left upper lobe are noted. Heart size is normal. IMPRESSION: Negative for pneumothorax after bronchoscopy.  No acute abnormality. Electronically Signed   By: Inge Rise M.D.   On: 09/05/2017 11:07   Ct Super D Chest Wo Contrast  Result Date: 08/17/2017 CLINICAL DATA:  Super D chest for left upper lobe pulmonary lesion. EXAM: CT CHEST WITHOUT CONTRAST TECHNIQUE: Multidetector CT imaging of the chest was performed using thin slice collimation for electromagnetic bronchoscopy planning purposes, without intravenous contrast. COMPARISON:  Chest CT 07/16/2017 and PET-CT 08/10/2017 FINDINGS: Cardiovascular: The heart is normal in size. No pericardial effusion. Stable mild tortuosity, ectasia and calcification of the thoracic aorta. Stable coronary artery calcifications. Mediastinum/Nodes: Small scattered mediastinal and hilar lymph nodes are stable. No change since prior study. Lungs/Pleura: Spiculated 13 x 13 mm left upper lobe pulmonary nodule which was metabolically active on the PET scan and consistent with primary lung neoplasm. No other pulmonary lesions are identified. There are emphysematous  changes and pulmonary scarring. There are also bilateral pleural effusions. Upper Abdomen: No significant upper abdominal findings. No hepatic or adrenal gland lesions. Musculoskeletal: No chest wall mass, supraclavicular or axillary adenopathy. Numerous healing rib fractures are noted bilaterally along with sternal fractures. These appears stable. IMPRESSION: 1. 13 x 13 mm left upper lobe pulmonary lesion consistent with primary lung neoplasm. 2. Stable mediastinal and hilar lymph nodes.  3. Emphysematous changes and pulmonary scarring. 4. Remote posttraumatic changes with healing rib and sternal fractures and loculated pleural fluid collections. Aortic Atherosclerosis (ICD10-I70.0) and Emphysema (ICD10-J43.9). Electronically Signed   By: Marijo Sanes M.D.   On: 08/17/2017 13:21   Dg C-arm Bronchoscopy  Result Date: 09/05/2017 C-ARM BRONCHOSCOPY: Fluoroscopy was utilized by the requesting physician.  No radiographic interpretation.       IMPRESSION/PLAN: 1. Stage IA2, cT1bN0M0, NSCLC, adenocarcinoma of the left upper lobe. Dr. Lisbeth Renshaw discusses the pathology findings and reviews the nature of early stage lung cancer. We discussed the benefits, risks, short, and long term effects of radiotherapy as an alternative to surgery if he is not a surgical candidate. Dr. Lisbeth Renshaw discusses the delivery and logistics of radiotherapy and anticipates 3-5 treatments to the upper lobe of the left lung if the patient chooses to proceed with radiation. The patient will meet with Dr. Roxan Hockey later today to discuss his surgical options, and is more interested in surgical approach.   In a visit lasting 45  minutes, greater than 50% of the time was spent face to face discussing his case and the options of radiotherapy, and coordinating the patient's care.   The above documentation reflects my direct findings during this shared patient visit. Please see the separate note by Dr. Lisbeth Renshaw on this date for the remainder of the  patient's plan of care.    Carola Rhine, PAC  This document serves as a record of services personally performed by Kyung Rudd, MD and Shona Simpson, PA-C. It was created on their behalf by Bethann Humble, a trained medical scribe. The creation of this record is based on the scribe's personal observations and the provider's statements to them. This document has been checked and approved by the attending provider.

## 2017-09-13 NOTE — Telephone Encounter (Signed)
Oncology Nurse Navigator Documentation  Oncology Nurse Navigator Flowsheets 09/13/2017  Navigator Location [facility]  Navigator Encounter Type Telephone/I called patient to see if he would be able to make his appt. He is coming. I updated him that he would be seeing Dr. Roxan Hockey as well Dr. Lisbeth Renshaw.    Telephone Outgoing Call  Treatment Phase Pre-Tx/Tx Discussion  Barriers/Navigation Needs Education  Education Other  Interventions Education  Education Method Verbal  Acuity Level 2  Time Spent with Patient 30

## 2017-09-13 NOTE — Progress Notes (Signed)
PCP is [name], MD Referring Provider is Marshell Garfinkel, MD  No chief complaint on file.   HPI: 76 yo man with newly diagnosed left upper lobe non-small cell carcinoma.  Nathaniel Long is a 76 yo man with a past history of back pain with sciatica, carotid stenosis, PAD, GERD, hearing loss, ascending aneurysm and CKD. He has a 60 py history of smoking before quitting in July 2018. He recently was working with a chain saw when either the saw kicked back or a limb hit him in the chest. 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 A PET showed moderate uptake with an SUV of 5.5. There as borderline uptake in the hilar and mediastinal lymph nodes, particularly the subcarinal node. Dr. Lamonte Sakai did an ENB and EBUS. 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 Lymph node aspirations showed lymphocytes but no tumor was seen.  He has pain from his recent injuries, but denies any other chest pain. He denies dyspnea and wheezing. No change in appetite, weight loss, headaches or visual changes.   Zubrod Score: At the time of surgery this patient's most appropriate activity status/level should be described as: [x]     0    Normal activity, no symptoms []     1    Restricted in physical strenuous activity but ambulatory, able to do out light work []     2    Ambulatory and capable of self care, unable to do work activities, up and about >50 % of waking hours                              []     3    Only limited self care, in bed greater than 50% of waking hours []     4    Completely disabled, no self care, confined to bed or chair []     5    Moribund     Past Medical History:  Diagnosis Date  . Anxiety   . Carotid artery stenosis   . Chronic lower back pain   . CKD (chronic kidney disease)   . Complication of anesthesia    "can't pee when I come out of OR" (07/18/2017)  . Contact with chainsaw as cause of accidental  injury 07/16/2017   sternal fracture with bilateral anterior fifth and sixth rib fractures /notes 07/16/2017  . Dyspnea   . GERD (gastroesophageal reflux disease)   . Hard of hearing   . High cholesterol   . Hypertension   . Thoracic ascending aortic aneurysm [facility])     Past Surgical History:  Procedure Laterality Date  . ABDOMINAL AORTOGRAM W/LOWER EXTREMITY N/A 05/01/2017   Procedure: Abdominal Aortogram w/Lower Extremity;  Surgeon: Adrian Prows, MD;  Location: [place] [facility];  Service: Cardiovascular;  Laterality: N/A;  . BACK SURGERY    . FINGER REPLANTATION Left    "ring finger"  . HEMORRHOID SURGERY    . POSTERIOR LUMBAR FUSION  1986  . VIDEO BRONCHOSCOPY WITH ENDOBRONCHIAL NAVIGATION N/A 09/05/2017   Procedure: VIDEO BRONCHOSCOPY WITH ENDOBRONCHIAL NAVIGATION;  Surgeon: Collene Gobble, MD;  Location: [place];  Service: Thoracic;  Laterality: N/A;  . VIDEO BRONCHOSCOPY WITH ENDOBRONCHIAL ULTRASOUND N/A 09/05/2017   Procedure: VIDEO BRONCHOSCOPY WITH ENDOBRONCHIAL ULTRASOUND;  Surgeon: Collene Gobble, MD;  Location: [place];  Service: Thoracic;  Laterality: N/A;  Family History  Problem Relation Age of Onset  . Colon polyps Brother   . Dementia Sister     Social History Social History   Tobacco Use  . Smoking status: Former Smoker    Packs/day: 1.00    Years: 33.00    Pack years: 33.00    Types: Cigarettes  . Smokeless tobacco: Never Used  . Tobacco comment: 07/18/2017 "stopped ~several months ago"  Substance Use Topics  . Alcohol use: Yes    Comment: 07/18/2017 "drank alot of beer; stopped in ~ 1993"  . Drug use: No    Current Outpatient Medications  Medication Sig Dispense Refill  . acetaminophen (TYLENOL) 325 MG tablet Take 1 tablet (325 mg total) by mouth every 6 (six) hours as needed for moderate pain or headache.    . ALPRAZolam (XANAX) 0.5 MG tablet Take 0.5 mg by mouth at bedtime.     Marland Kitchen amLODipine (NORVASC) 5 MG tablet Take 5 mg daily by mouth.  0  .  aspirin EC 81 MG tablet Take 1 tablet (81 mg total) by mouth daily. 30 tablet 2  . calcium carbonate (TUMS - DOSED IN MG ELEMENTAL CALCIUM) 500 MG chewable tablet Chew 1 tablet by mouth daily as needed for indigestion or heartburn.    . cetirizine (ZYRTEC) 10 MG tablet TAKE 1 TABLET BY MOUTH AT BEDTIME (Patient taking differently: Take 10 mg daily as needed for allergies) 31 tablet 3  . Cholecalciferol (VITAMIN D) 2000 units CAPS Take 4,000 Units by mouth every evening.    . clotrimazole (LOTRIMIN) 1 % cream Apply 1 application topically 2 (two) times daily.    . fish oil-omega-3 fatty acids 1000 MG capsule Take 1 g by mouth every evening.     . Multiple Vitamins-Minerals (CENTRUM PO) Take 1 tablet by mouth every evening.     . pantoprazole (PROTONIX) 40 MG tablet Take 40 mg by mouth at bedtime.     . Pitavastatin Calcium (LIVALO) 4 MG TABS Take 2 mg by mouth every evening.     . sodium chloride (OCEAN) 0.65 % SOLN nasal spray Place 1 spray into both nostrils as needed for congestion.    Marland Kitchen umeclidinium-vilanterol (ANORO ELLIPTA) 62.5-25 MCG/INH AEPB Inhale 1 puff daily into the lungs. 60 each 3   No current facility-administered medications for this visit.     Allergies  Allergen Reactions  . Gabapentin Other (See Comments)    dizziness  . Statins Other (See Comments)    Muscle weakness    Review of Systems  Constitutional: Negative for activity change, appetite change, fever and unexpected weight change.  HENT: Positive for hearing loss (severe). Negative for trouble swallowing and voice change.   Respiratory: Positive for cough. Negative for shortness of breath and wheezing.   Cardiovascular: Positive for chest pain (just related to recent trauma). Negative for palpitations and leg swelling.  Gastrointestinal: Negative for abdominal pain and blood in stool.  Genitourinary: Positive for difficulty urinating and frequency.  Musculoskeletal: Positive for arthralgias, back pain and joint  swelling.       Hip pain- sciatica  Neurological: Negative for seizures, syncope and weakness.  Hematological: Negative for adenopathy. Does not bruise/bleed easily.    BP 122/65   Pulse 65   Temp 98.4 F (36.9 C)   Resp 18   Wt 189 lb 3.2 oz (85.8 kg)   SpO2 99%   BMI 26.76 kg/m  Physical Exam  Constitutional: He is oriented to person, place, and time. He  appears well-developed and well-nourished. No distress.  HENT:  Head: Normocephalic and atraumatic.  Mouth/Throat: No oropharyngeal exudate.  Extremely HOH  Eyes: Conjunctivae and EOM are normal. No scleral icterus.  Neck: No thyromegaly present.  + bruits  Cardiovascular: Normal rate and regular rhythm.  Murmur (2/6 systolic) heard. Pulmonary/Chest: Effort normal and breath sounds normal. No respiratory distress. He has no wheezes. He has no rales.  Tender over sternum  Abdominal: Soft. He exhibits no distension. There is no tenderness.  Musculoskeletal: He exhibits no edema.  Lymphadenopathy:    He has no cervical adenopathy.  Neurological: He is alert and oriented to person, place, and time. No cranial nerve deficit. He exhibits normal muscle tone.  Skin: Skin is warm and dry.  Vitals reviewed.    Diagnostic Tests: CT CHEST WITHOUT CONTRAST  TECHNIQUE: Multidetector CT imaging of the chest was performed using thin slice collimation for electromagnetic bronchoscopy planning purposes, without intravenous contrast.  COMPARISON:  Chest CT 07/16/2017 and PET-CT 08/10/2017  FINDINGS: Cardiovascular: The heart is normal in size. No pericardial effusion. Stable mild tortuosity, ectasia and calcification of the thoracic aorta. Stable coronary artery calcifications.  Mediastinum/Nodes: Small scattered mediastinal and hilar lymph nodes are stable. No change since prior study.  Lungs/Pleura: Spiculated 13 x 13 mm left upper lobe pulmonary nodule which was metabolically active on the PET scan and consistent  with primary lung neoplasm. No other pulmonary lesions are identified. There are emphysematous changes and pulmonary scarring. There are also bilateral pleural effusions.  Upper Abdomen: No significant upper abdominal findings. No hepatic or adrenal gland lesions.  Musculoskeletal: No chest wall mass, supraclavicular or axillary adenopathy. Numerous healing rib fractures are noted bilaterally along with sternal fractures. These appears stable.  IMPRESSION: 1. 13 x 13 mm left upper lobe pulmonary lesion consistent with primary lung neoplasm. 2. Stable mediastinal and hilar lymph nodes. 3. Emphysematous changes and pulmonary scarring. 4. Remote posttraumatic changes with healing rib and sternal fractures and loculated pleural fluid collections.  Aortic Atherosclerosis (ICD10-I70.0) and Emphysema (ICD10-J43.9).   Electronically Signed   By: Marijo Sanes M.D.   On: 08/17/2017 13:21 NUCLEAR MEDICINE PET SKULL BASE TO THIGH  TECHNIQUE: 9.5 mCi F-18 FDG was injected intravenously. Full-ring PET imaging was performed from the skull base to thigh after the radiotracer. CT data was obtained and used for attenuation correction and anatomic localization.  FASTING BLOOD GLUCOSE:  Value: 110 mg/dl  COMPARISON:  CT on 07/16/2017  FINDINGS: NECK:  No hypermetabolic lymph nodes or masses.  CHEST: 10 mm subcarinal mediastinal lymph node shows mild FDG uptake, with SUV max of 3.6. No other hypermetabolic lymph nodes identified within the thorax.  A persistent spiculated pulmonary nodule is seen in the anterior left upper lobe measuring 16 mm, which shows FDG uptake with SUV max of 5.5. No other suspicious pulmonary nodules or masses are identified by CT.  New small bilateral multilocular pleural effusions are seen, without abnormal pleural FDG uptake.  ABDOMEN/PELVIS: No abnormal hypermetabolic activity within the liver, pancreas, adrenal glands, or spleen. No  hypermetabolic lymph nodes in the abdomen or pelvis. Aortic atherosclerosis.  SKELETON: No suspicious bone lesions to suggest skeletal metastasis. Hypermetabolic activity is seen at sites of multiple bilateral rib and sternal fractures seen on CT images, which do not appear pathologic.  IMPRESSION: 1.6 cm hypermetabolic spiculated pulmonary nodule in anterior left upper lobe, highly suspicious for primary bronchogenic carcinoma.  10 mm subcarinal mediastinal lymph node shows FDG uptake, suspicious for lymph node metastasis.  No evidence of metastatic disease within the neck, abdomen, or pelvis.  New small bilateral multilocular pleural effusions, likely posttraumatic in etiology. Multiple healing bilateral rib and sternal fractures, which do not appear pathologic.   Electronically Signed   By: Earle Gell M.D.   On: 08/10/2017 14:31]  PFT FVC= 3.82 (89%) FEV1= 2.11 (68%) FEV1= 2.22 (71%) post bronchodilator DLCO= 13.63 (41%)   Impression: 76 yo man with a long standing history of tobacco abuse who was incidentally found to have a 1.6 cm spiculated nodule in the left upper lobe on CT done for trauma. Biopsy showed non-small cell carcinoma. He had some borderline mediastinal adenopathy on PET but asprations of the nodes were negative for tumor.  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 Options for treatment include surgical resection and radiation. I reviewed the films with Nathaniel Long. We discussed the relative advantages and disadvantages of radiation and surgery. He strongly favors surgery.  I discussed the general nature of the procedure, the need for general anesthesia,the incisions to be used and the use of a drainage tube postoperatively with Nathaniel Long. We discussed the expected hospital stay, overall recovery and short and long term outcomes. I informed them of the indications, risks, benefits and alternatives. They understand the risks include but are not  limited to death, stroke, MI, DVT/PE, bleeding, possible need for transfusion, infections, cardiac arrhythmias, prolonged air leak, as well as other unforeseeableI complications.  He accepts the risks and agrees to proceed.  Plan:  Left VATS, left upper lobectomy on Wed 09/19/2017  Melrose Nakayama, MD [facility] [phone]

## 2017-09-17 ENCOUNTER — Ambulatory Visit (HOSPITAL_COMMUNITY)
Admission: RE | Admit: 2017-09-17 | Discharge: 2017-09-17 | Disposition: A | Discharge status: Home / Self Care | Payer: Medicare Other | Source: Ambulatory Visit | Attending: Thoracic Surgery (Cardiothoracic Vascular Surgery) | Admitting: Thoracic Surgery (Cardiothoracic Vascular Surgery)

## 2017-09-17 ENCOUNTER — Encounter (HOSPITAL_COMMUNITY)
Admission: RE | Admit: 2017-09-17 | Discharge: 2017-09-17 | Disposition: A | Discharge status: Home / Self Care | Payer: Medicare Other | Source: Ambulatory Visit | Attending: Thoracic Surgery (Cardiothoracic Vascular Surgery) | Admitting: Thoracic Surgery (Cardiothoracic Vascular Surgery)

## 2017-09-17 ENCOUNTER — Encounter (HOSPITAL_COMMUNITY): Payer: Self-pay

## 2017-09-17 ENCOUNTER — Other Ambulatory Visit: Payer: Self-pay

## 2017-09-17 DIAGNOSIS — Z01818 Encounter for other preprocedural examination: Secondary | ICD-10-CM

## 2017-09-17 DIAGNOSIS — Z01810 Encounter for preprocedural cardiovascular examination: Secondary | ICD-10-CM

## 2017-09-17 DIAGNOSIS — C3492 Malignant neoplasm of unspecified part of left bronchus or lung: Secondary | ICD-10-CM | POA: Insufficient documentation

## 2017-09-17 DIAGNOSIS — I739 Peripheral vascular disease, unspecified: Secondary | ICD-10-CM | POA: Insufficient documentation

## 2017-09-17 DIAGNOSIS — I700 Atherosclerosis of aorta: Secondary | ICD-10-CM

## 2017-09-17 DIAGNOSIS — Z01812 Encounter for preprocedural laboratory examination: Secondary | ICD-10-CM

## 2017-09-17 HISTORY — DX: Chronic obstructive pulmonary disease, unspecified: J44.9

## 2017-09-17 LAB — COMPREHENSIVE METABOLIC PANEL
ALBUMIN: 4.1 g/dL (ref 3.5–5.0)
ALK PHOS: 115 U/L (ref 38–126)
ALT: 19 U/L (ref 17–63)
ANION GAP: 7 (ref 5–15)
AST: 24 U/L (ref 15–41)
BILIRUBIN TOTAL: 0.7 mg/dL (ref 0.3–1.2)
BUN: 14 mg/dL (ref 6–20)
CO2: 22 mmol/L (ref 22–32)
Calcium: 9.2 mg/dL (ref 8.9–10.3)
Chloride: 107 mmol/L (ref 101–111)
Creatinine, Ser: 1.52 mg/dL — ABNORMAL HIGH (ref 0.61–1.24)
GFR calc Af Amer: 50 mL/min — ABNORMAL LOW (ref >60)
GFR calc non Af Amer: 43 mL/min — ABNORMAL LOW (ref >60)
GLUCOSE: 119 mg/dL — AB (ref 65–99)
POTASSIUM: 4 mmol/L (ref 3.5–5.1)
SODIUM: 136 mmol/L (ref 135–145)
Total Protein: 7.4 g/dL (ref 6.5–8.1)

## 2017-09-17 LAB — URINALYSIS, ROUTINE W REFLEX MICROSCOPIC
Bilirubin Urine: NEGATIVE
GLUCOSE, UA: NEGATIVE mg/dL
HGB URINE DIPSTICK: NEGATIVE
Ketones, ur: NEGATIVE mg/dL
Leukocytes, UA: NEGATIVE
NITRITE: NEGATIVE
PH: 6 (ref 5.0–8.0)
Protein, ur: 30 mg/dL — AB
Specific Gravity, Urine: 1.014 (ref 1.005–1.030)
Squamous Epithelial / LPF: NONE SEEN

## 2017-09-17 LAB — CBC
HEMATOCRIT: 40.5 % (ref 39.0–52.0)
HEMOGLOBIN: 13.3 g/dL (ref 13.0–17.0)
MCH: 30.7 pg (ref 26.0–34.0)
MCHC: 32.8 g/dL (ref 30.0–36.0)
MCV: 93.5 fL (ref 78.0–100.0)
Platelets: 362 10*3/uL (ref 150–400)
RBC: 4.33 MIL/uL (ref 4.22–5.81)
RDW: 13.9 % (ref 11.5–15.5)
WBC: 9.7 10*3/uL (ref 4.0–10.5)

## 2017-09-17 LAB — APTT: APTT: 30 s (ref 24–36)

## 2017-09-17 LAB — TYPE AND SCREEN
ABO/RH(D): O POS
ANTIBODY SCREEN: NEGATIVE

## 2017-09-17 LAB — PROTIME-INR
INR: 0.99
Prothrombin Time: 13 seconds (ref 11.4–15.2)

## 2017-09-17 LAB — SURGICAL PCR SCREEN
MRSA, PCR: NEGATIVE
Staphylococcus aureus: NEGATIVE

## 2017-09-17 LAB — ABO/RH: ABO/RH(D): O POS

## 2017-09-17 NOTE — Pre-Procedure Instructions (Signed)
Nathaniel Long  09/17/2017      [id number] [place], [place] - [address] [place] [address] Phone: [phone] Fax: [phone]    Your procedure is scheduled on 09/19/17.  Report to [facility] Admitting at 630 A.M.  Call this number if you have problems the morning of surgery:  [phone]   Remember:  Do not eat food or drink liquids after midnight.  Take these medicines the morning of surgery with A SIP OF WATER      Pantoprazole(protonix),amlodipine(norvasc) ,inhaler (anoro-ellipta) if needed,tylenol if needed  Stopr aspirin per dr instructions  STOP all herbel meds, nsaids (aleve,naproxen,advil,ibuprofen)  Today including all vitamins/supplements,fish oil,   Do not wear jewelry, make-up or nail polish.  Do not wear lotions, powders, or perfumes, or deodorant.  Do not shave 48 hours prior to surgery.  Men may shave face and neck.  Do not bring valuables to the hospital.  [facility] is not responsible for any belongings or valuables.  Contacts, dentures or bridgework may not be worn into surgery.  Leave your suitcase in the car.  After surgery it may be brought to your room.  For patients admitted to the hospital, discharge time will be determined by your treatment team.  Patients discharged the day of surgery will not be allowed to drive home.   Special instructions:   Special Instructions: [place] - Preparing for Surgery  Before surgery, you can play an important role.  Because skin is not sterile, your skin needs to be as free of germs as possible.  You can reduce the number of germs on you skin by washing with CHG (chlorahexidine gluconate) soap before surgery.  CHG is an antiseptic cleaner which kills germs and bonds with the skin to continue killing germs even after washing.  Please DO NOT use if you have an allergy to CHG or antibacterial soaps.  If your skin becomes  reddened/irritated stop using the CHG and inform your nurse when you arrive at Short Stay.  Do not shave (including legs and underarms) for at least 48 hours prior to the first CHG shower.  You may shave your face.  Please follow these instructions carefully:   1.  Shower with CHG Soap the night before surgery and the morning of Surgery.  2.  If you choose to wash your hair, wash your hair first as usual with your normal shampoo.  3.  After you shampoo, rinse your hair and body thoroughly to remove the Shampoo.  4.  Use CHG as you would any other liquid soap.  You can apply chg directly  to the skin and wash gently with scrungie or a clean washcloth.  5.  Apply the CHG Soap to your body ONLY FROM THE NECK DOWN.  Do not use on open wounds or open sores.  Avoid contact with your eyes ears, mouth and genitals (private parts).  Wash genitals (private parts)       with your normal soap.  6.  Wash thoroughly, paying special attention to the area where your surgery will be performed.  7.  Thoroughly rinse your body with warm water from the neck down.  8.  DO NOT shower/wash with your normal soap after using and rinsing off the CHG Soap.  9.  Pat yourself dry with a clean towel.            10.  Wear clean  pajamas.            11.  Place clean sheets on your bed the night of your first shower and do not sleep with pets.  Day of Surgery  Do not apply any lotions/deodorants the morning of surgery.  Please wear clean clothes to the hospital/surgery center.  Please read over the  fact sheets that you were given.

## 2017-09-17 NOTE — Pre-Procedure Instructions (Signed)
Nathaniel Long  09/17/2017      [id number] [address] [place], [place] - [address] [place] [address] Phone: [phone] Fax: [phone]    Your procedure is scheduled on 09/19/17.  Report to [facility] Admitting at 630 A.M.  Call this number if you have problems the morning of surgery:  [phone]   Remember:  Do not eat food or drink liquids after midnight.  Take these medicines the morning of surgery with A SIP OF WATER    amlodipine(norvasc) ,inhaler (anoro-ellipta) if needed,tylenol if needed  Stop aspirin per dr instructions  STOP all herbel meds, nsaids (aleve,naproxen,advil,ibuprofen)  Today including all vitamins/supplements,fish oil,   Do not wear jewelry, make-up or nail polish.  Do not wear lotions, powders, or perfumes, or deodorant.  Do not shave 48 hours prior to surgery.  Men may shave face and neck.  Do not bring valuables to the hospital.  [facility] is not responsible for any belongings or valuables.  Contacts, dentures or bridgework may not be worn into surgery.  Leave your suitcase in the car.  After surgery it may be brought to your room.  For patients admitted to the hospital, discharge time will be determined by your treatment team.  Patients discharged the day of surgery will not be allowed to drive home.   Special instructions:   Special Instructions: [place] - Preparing for Surgery  Before surgery, you can play an important role.  Because skin is not sterile, your skin needs to be as free of germs as possible.  You can reduce the number of germs on you skin by washing with CHG (chlorahexidine gluconate) soap before surgery.  CHG is an antiseptic cleaner which kills germs and bonds with the skin to continue killing germs even after washing.  Please DO NOT use if you have an allergy to CHG or antibacterial soaps.  If your skin becomes reddened/irritated stop using the  CHG and inform your nurse when you arrive at Short Stay.  Do not shave (including legs and underarms) for at least 48 hours prior to the first CHG shower.  You may shave your face.  Please follow these instructions carefully:   1.  Shower with CHG Soap the night before surgery and the morning of Surgery.  2.  If you choose to wash your hair, wash your hair first as usual with your normal shampoo.  3.  After you shampoo, rinse your hair and body thoroughly to remove the Shampoo.  4.  Use CHG as you would any other liquid soap.  You can apply chg directly  to the skin and wash gently with scrungie or a clean washcloth.  5.  Apply the CHG Soap to your body ONLY FROM THE NECK DOWN.  Do not use on open wounds or open sores.  Avoid contact with your eyes ears, mouth and genitals (private parts).  Wash genitals (private parts)       with your normal soap.  6.  Wash thoroughly, paying special attention to the area where your surgery will be performed.  7.  Thoroughly rinse your body with warm water from the neck down.  8.  DO NOT shower/wash with your normal soap after using and rinsing off the CHG Soap.  9.  Pat yourself dry with a clean towel.            10.  Wear clean pajamas.  11.  Place clean sheets on your bed the night of your first shower and do not sleep with pets.  Day of Surgery  Do not apply any lotions/deodorants the morning of surgery.  Please wear clean clothes to the hospital/surgery center.  Please read over the  fact sheets that you were given.

## 2017-09-18 LAB — BLOOD GAS, ARTERIAL

## 2017-09-18 NOTE — Progress Notes (Signed)
Anesthesia chart review: Patient is a 76 year old male scheduled for left VATS, upper lobectomy on 09/19/17 by Dr. Modesto Charon. Video bronchoscopy with biopsied on 09/05/17 were positive for non-small carcinoma.   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 He had very mild AS by 07/2017 echo and right renal artery stenosis by 05/01/17 PV study. Patient is very hard of hearing.   - PCP is Dr. [name]. - Pulmonologist is Dr. Marshell Garfinkel. - Cardiologist is Dr. Adrian Prows. He saw patient during 07/2017 admission for blunt chest trauma. Plavix (for PAD) was changed to ASA in the setting of sternal hematoma from blunt chest wall trauma. Elevated troponin felt non-specific. Echo ordered (see below). Out-patient stress test on 08/31/17 (see below). Due to EKG changes, I called and spoke with Dr. Vernell Leep (who is covering from Dr. Einar Gip while he is out of town). He reviewed EKGs and 08/31/17 stress test. Unless patient was having chest pain, he would not plan on additional pre-operative testing. If asymptomatic then he felt patient would be low-moderate risk  I spoke with patient's wife Horris Latino. She was able to communicate my questions to Mr. Lasch as he is quite hard of hearing (and cannot hear on the telephone). He said he breathing actually feels improved. He has been doing IS and can get it to the top. In regards to chest pain, she said that his chest was "more flexible" after his 09/12/17 PFTs and his sternum stays sore. Pressing on it does not cause it to hurt more, but he can feel it. He does not feel it  is chest pain (heart related). I discussed this with anesthesiologist Dr. Annye Asa. She recommended additional input from cardiology, so I called and spoke with Dr. Virgina Jock again and relayed patient input from my phone call earlier today. Dr. Virgina Jock, completed that above letter as part of pre-operative cardiac assessment: "The above-named patient Hedwig Morton. DOB [birth year]] has non-specific T-wave inversions in anteroseptal and lateral leads. If no on-going chest pain, I do not think that this is unstable angina. Patient has recently had a nuclear stress test with no evidence of ischemia or infarction. LV function is preserved and the wall motion is normal. If you have any questions or need any additional information, you may contact my office at [phone]."  Meds include Xanax, amlodipine, aspirin 81 mg, Zyrtec, fish oil-omega-3 fatty acid, Protonix, Livalo, Anora Ellipta.  BP (!) 133/46   Pulse 68   Temp 36.4 C (Oral)   Resp 18   Wt 189 lb 4 oz (85.8 kg)   SpO2 97%   BMI 26.77 kg/m   EKG 09/17/17: NSR, ST/T wave abnormality, consider anterolateral ischemia. Previous EKG shows more flat T wave abnormality, but T wave inversion is new in V3-6.   Nuclear stress test 08/31/17 [facility] CV; scanned under Media tab, Correspondence 09/05/17): Impression: 1. Stress EKG is non-diagnostic for ischemia as it is a pharmacologic stress. 2. Gated SPECT images reveal normal myocardial thickening and wall motion. The left ventricular EF was calculated to be 60%. SPECT images demonstrate small perfusion abnormality of mild intensity in the basal inferior and mid inferior myocardial wall(s) on the stress images with mild  reversibility on rest images. This could represent diaphragmatic attenuation. 3. This is a low risk study.   Echo 07/18/17: Study Conclusions - Left ventricle: The cavity size was normal. Systolic function was normal. The estimated ejection fraction was in the  range of 55% to 60%. Wall motion was normal; there were no regional wall motion abnormalities. Doppler parameters are consistent with abnormal left ventricular relaxation (grade 1 diastolic dysfunction). - Aortic valve: There was very mild stenosis. Mean gradient (S): 10 mm Hg. Valve area (VTI): 2.39 cm^2. Valve area (Vmax): 2.13 cm^2. Valve area (Vmean): 2.05 cm^2. - Left atrium: The atrium was mildly dilated.  Carotid U/S 03/08/17 [facility] CV; scanned under Media tab, Correspondence 09/05/17): Conclusions: Minimal stenosis in the right internal carotid artery (1-15%). Mild stenosis in the right external carotid artery (< 50%). Stenosis of right common carotid artery of < 50%. Stenosis of the left internal carotid artery (50-69%). Stenosis in the left common carotid artery (> 50%). Mild stenosis of the left external carotid artery (< 50%). Left vertebral artery waveform shows high resistance. Antegrade right vertebral artery flow. Follow-up in 6 months is a appropriate if clinically indicated. Compared to the study done on 09/05/2016, right ICA stenosis of 50-69% stenosis not noted. No change in left ICA stenosis  Abdominal aortogram 05/01/17: Impression:Peripheral arterial disease involving below-knee vessels, does not explain his symptoms, hence continue medical therapy. Has high-grade right renal artery stenosis, blood pressure is well controlled although does have stage III chronic kidney disease. Unless there is worsening renal function, medical therapy for renal artery stenosis. 75 mL contrast utilized.  CXR 09/17/17: IMPRESSION: Pulmonary nodular lesion seen on recent CT in the left upper lobe is appreciable by radiography. This areas concerning for neoplasm. No edema or consolidation. No adenopathy. There is aortic atherosclerosis. Aortic Atherosclerosis (ICD10-I70.0).  CT Super D Chest 08/17/17: IMPRESSION: 1. 13 x 13 mm left upper lobe pulmonary lesion consistent  with primary lung neoplasm. 2. Stable mediastinal and hilar lymph nodes. 3. Emphysematous changes and pulmonary scarring. 4. Remote posttraumatic changes with healing rib and sternal fractures and loculated pleural fluid collections.  PET scan 08/10/17: IMPRESSION: - 1.6 cm hypermetabolic spiculated pulmonary nodule in anterior left upper lobe, highly suspicious for primary bronchogenic carcinoma. - 10 mm subcarinal mediastinal lymph node shows FDG uptake, suspicious for lymph node metastasis. - No evidence of metastatic disease within the neck, abdomen, or pelvis. - New small bilateral multilocular pleural effusions, likely posttraumatic in etiology. Multiple healing bilateral rib and sternal fractures, which do not appear pathologic.  PFTs 09/12/17: FVC 3.82 (89%), FEV1 2.11 (68%), DLCO unc 13.35 (40%).  Preoperative labs noted. Cr 1.52, appears stable. CBC, PT/PTT WNL. Glucose 119. T&S done. UA negative for leukocytes, nitrites. He is for ABG on the day of surgery.   Preoperative cardiology input outlined above. I routed Dr. Bonney Roussel input to Dr. Roxan Hockey. Anesthesiologist to evaluate on the day of surgery.   George Hugh [facility]/Anesthesiology Phone [phone] 09/18/2017 3:54 PM

## 2017-09-18 NOTE — Anesthesia Preprocedure Evaluation (Addendum)
Anesthesia Evaluation  Patient identified by MRN, date of birth, ID band Patient awake    Reviewed: Allergy & Precautions, H&P , NPO status , Patient's Chart, lab work & pertinent test results  Airway Mallampati: II  TM Distance: >3 FB Neck ROM: Full    Dental no notable dental hx. (+) Teeth Intact, Dental Advisory Given   Pulmonary COPD, former smoker,    Pulmonary exam normal breath sounds clear to auscultation       Cardiovascular Exercise Tolerance: Good hypertension, Pt. on medications + Peripheral Vascular Disease  + Valvular Problems/Murmurs AS  Rhythm:Regular Rate:Normal     Neuro/Psych Anxiety negative neurological ROS  negative psych ROS   GI/Hepatic Neg liver ROS, GERD  ,  Endo/Other  negative endocrine ROS  Renal/GU Renal InsufficiencyRenal disease  negative genitourinary   Musculoskeletal   Abdominal   Peds  Hematology negative hematology ROS (+)   Anesthesia Other Findings   Reproductive/Obstetrics negative OB ROS                            Anesthesia Physical Anesthesia Plan  ASA: III  Anesthesia Plan: General   Post-op Pain Management:    Induction: Intravenous  PONV Risk Score and Plan: 3 and Ondansetron, Dexamethasone and Midazolam  Airway Management Planned: Double Lumen EBT  Additional Equipment: Arterial line, CVP and Ultrasound Guidance Line Placement  Intra-op Plan:   Post-operative Plan: Extubation in OR and Possible Post-op intubation/ventilation  Informed Consent: I have reviewed the patients History and Physical, chart, labs and discussed the procedure including the risks, benefits and alternatives for the proposed anesthesia with the patient or authorized representative who has indicated his/her understanding and acceptance.   Dental advisory given  Plan Discussed with: CRNA  Anesthesia Plan Comments:         Anesthesia Quick  Evaluation

## 2017-09-19 ENCOUNTER — Inpatient Hospital Stay (HOSPITAL_COMMUNITY): Payer: Medicare Other | Admitting: Anesthesiology

## 2017-09-19 ENCOUNTER — Encounter (HOSPITAL_COMMUNITY): Payer: Self-pay

## 2017-09-19 ENCOUNTER — Inpatient Hospital Stay (HOSPITAL_COMMUNITY): Payer: Medicare Other | Admitting: Vascular Surgery

## 2017-09-19 ENCOUNTER — Inpatient Hospital Stay (HOSPITAL_COMMUNITY)
Admission: RE | Admit: 2017-09-19 | Discharge: 2017-09-26 | DRG: 164 | Disposition: A | Discharge status: Home Health | Payer: Medicare Other | Source: Ambulatory Visit | Attending: Thoracic Surgery (Cardiothoracic Vascular Surgery) | Admitting: Thoracic Surgery (Cardiothoracic Vascular Surgery)

## 2017-09-19 ENCOUNTER — Other Ambulatory Visit: Payer: Self-pay

## 2017-09-19 ENCOUNTER — Encounter (HOSPITAL_COMMUNITY)
Admission: RE | Disposition: A | Discharge status: Home Health | Payer: Self-pay | Source: Ambulatory Visit | Attending: Thoracic Surgery (Cardiothoracic Vascular Surgery)

## 2017-09-19 ENCOUNTER — Inpatient Hospital Stay (HOSPITAL_COMMUNITY): Payer: Medicare Other

## 2017-09-19 DIAGNOSIS — I712 Thoracic aortic aneurysm, without rupture: Secondary | ICD-10-CM | POA: Diagnosis present

## 2017-09-19 DIAGNOSIS — W312XXS Contact with powered woodworking and forming machines, sequela: Secondary | ICD-10-CM

## 2017-09-19 DIAGNOSIS — Z7982 Long term (current) use of aspirin: Secondary | ICD-10-CM | POA: Diagnosis not present

## 2017-09-19 DIAGNOSIS — K5900 Constipation, unspecified: Secondary | ICD-10-CM | POA: Diagnosis present

## 2017-09-19 DIAGNOSIS — Z23 Encounter for immunization: Secondary | ICD-10-CM

## 2017-09-19 DIAGNOSIS — I10 Essential (primary) hypertension: Secondary | ICD-10-CM | POA: Diagnosis present

## 2017-09-19 DIAGNOSIS — I739 Peripheral vascular disease, unspecified: Secondary | ICD-10-CM | POA: Diagnosis present

## 2017-09-19 DIAGNOSIS — R0789 Other chest pain: Secondary | ICD-10-CM | POA: Diagnosis present

## 2017-09-19 DIAGNOSIS — C3412 Malignant neoplasm of upper lobe, left bronchus or lung: Principal | ICD-10-CM | POA: Diagnosis present

## 2017-09-19 DIAGNOSIS — Z87891 Personal history of nicotine dependence: Secondary | ICD-10-CM

## 2017-09-19 DIAGNOSIS — S2243XS Multiple fractures of ribs, bilateral, sequela: Secondary | ICD-10-CM

## 2017-09-19 DIAGNOSIS — D649 Anemia, unspecified: Secondary | ICD-10-CM | POA: Diagnosis present

## 2017-09-19 DIAGNOSIS — C3492 Malignant neoplasm of unspecified part of left bronchus or lung: Secondary | ICD-10-CM

## 2017-09-19 DIAGNOSIS — S2220XS Unspecified fracture of sternum, sequela: Secondary | ICD-10-CM

## 2017-09-19 DIAGNOSIS — K219 Gastro-esophageal reflux disease without esophagitis: Secondary | ICD-10-CM | POA: Diagnosis present

## 2017-09-19 DIAGNOSIS — Z981 Arthrodesis status: Secondary | ICD-10-CM

## 2017-09-19 DIAGNOSIS — E7800 Pure hypercholesterolemia, unspecified: Secondary | ICD-10-CM | POA: Diagnosis present

## 2017-09-19 DIAGNOSIS — J948 Other specified pleural conditions: Secondary | ICD-10-CM | POA: Diagnosis present

## 2017-09-19 DIAGNOSIS — J449 Chronic obstructive pulmonary disease, unspecified: Secondary | ICD-10-CM | POA: Diagnosis present

## 2017-09-19 DIAGNOSIS — F419 Anxiety disorder, unspecified: Secondary | ICD-10-CM | POA: Diagnosis present

## 2017-09-19 DIAGNOSIS — H9190 Unspecified hearing loss, unspecified ear: Secondary | ICD-10-CM | POA: Diagnosis present

## 2017-09-19 DIAGNOSIS — Z902 Acquired absence of lung [part of]: Secondary | ICD-10-CM

## 2017-09-19 DIAGNOSIS — Z09 Encounter for follow-up examination after completed treatment for conditions other than malignant neoplasm: Secondary | ICD-10-CM

## 2017-09-19 DIAGNOSIS — J939 Pneumothorax, unspecified: Secondary | ICD-10-CM

## 2017-09-19 DIAGNOSIS — J95811 Postprocedural pneumothorax: Secondary | ICD-10-CM | POA: Diagnosis present

## 2017-09-19 DIAGNOSIS — Z9689 Presence of other specified functional implants: Secondary | ICD-10-CM

## 2017-09-19 HISTORY — PX: VIDEO ASSISTED THORACOSCOPY (VATS)/ LOBECTOMY: SHX6169

## 2017-09-19 LAB — BLOOD GAS, ARTERIAL
Acid-base deficit: 2.9 mmol/L — ABNORMAL HIGH (ref 0.0–2.0)
Bicarbonate: 21.6 mmol/L (ref 20.0–28.0)
Drawn by: 449841
FIO2: 21
O2 Saturation: 98.1 %
PATIENT TEMPERATURE: 98.6
PO2 ART: 112 mmHg — AB (ref 83.0–108.0)
pCO2 arterial: 38.2 mmHg (ref 32.0–48.0)
pH, Arterial: 7.37 (ref 7.350–7.450)

## 2017-09-19 SURGERY — VIDEO ASSISTED THORACOSCOPY (VATS)/ LOBECTOMY
Anesthesia: General | Site: Chest | Laterality: Left

## 2017-09-19 MED ORDER — MIDAZOLAM HCL 2 MG/2ML IJ SOLN
INTRAMUSCULAR | Status: AC
  Filled 2017-09-19: qty 2

## 2017-09-19 MED ORDER — DEXAMETHASONE SODIUM PHOSPHATE 10 MG/ML IJ SOLN
INTRAMUSCULAR | Status: AC
  Filled 2017-09-19: qty 1

## 2017-09-19 MED ORDER — PROPOFOL 10 MG/ML IV BOLUS
INTRAVENOUS | Status: AC
  Filled 2017-09-19: qty 20

## 2017-09-19 MED ORDER — ONDANSETRON HCL 4 MG/2ML IJ SOLN
INTRAMUSCULAR | Status: AC
  Filled 2017-09-19: qty 2

## 2017-09-19 MED ORDER — AMLODIPINE BESYLATE 5 MG PO TABS
5.0000 mg | ORAL_TABLET | Freq: Every day | ORAL | Status: DC
  Administered 2017-09-20: 5 mg via ORAL
  Filled 2017-09-19: qty 1

## 2017-09-19 MED ORDER — SENNOSIDES-DOCUSATE SODIUM 8.6-50 MG PO TABS
1.0000 | ORAL_TABLET | Freq: Every day | ORAL | Status: DC
  Administered 2017-09-19 – 2017-09-25 (×7): 1 via ORAL
  Filled 2017-09-19 (×7): qty 1

## 2017-09-19 MED ORDER — FENTANYL CITRATE (PF) 250 MCG/5ML IJ SOLN
INTRAMUSCULAR | Status: AC
  Filled 2017-09-19: qty 5

## 2017-09-19 MED ORDER — ONDANSETRON HCL 4 MG/2ML IJ SOLN
INTRAMUSCULAR | Status: DC | PRN
Start: 2017-09-19 — End: 2017-09-19
  Administered 2017-09-19: 4 mg via INTRAVENOUS

## 2017-09-19 MED ORDER — FENTANYL CITRATE (PF) 100 MCG/2ML IJ SOLN
INTRAMUSCULAR | Status: DC | PRN
  Administered 2017-09-19: 200 ug via INTRAVENOUS
  Administered 2017-09-19 (×4): 50 ug via INTRAVENOUS
  Administered 2017-09-19: 100 ug via INTRAVENOUS

## 2017-09-19 MED ORDER — NALOXONE HCL 0.4 MG/ML IJ SOLN: 0.4000 mg | INTRAMUSCULAR | Status: DC | PRN

## 2017-09-19 MED ORDER — DIPHENHYDRAMINE HCL 50 MG/ML IJ SOLN: 12.5000 mg | Freq: Four times a day (QID) | INTRAMUSCULAR | Status: DC | PRN

## 2017-09-19 MED ORDER — ROCURONIUM BROMIDE 10 MG/ML (PF) SYRINGE
PREFILLED_SYRINGE | INTRAVENOUS | Status: AC
  Filled 2017-09-19: qty 5

## 2017-09-19 MED ORDER — ACETAMINOPHEN 500 MG PO TABS
1000.0000 mg | ORAL_TABLET | Freq: Four times a day (QID) | ORAL | Status: AC
  Administered 2017-09-19 – 2017-09-24 (×17): 1000 mg via ORAL
  Filled 2017-09-19 (×17): qty 2

## 2017-09-19 MED ORDER — ONDANSETRON HCL 4 MG/2ML IJ SOLN: 4.0000 mg | Freq: Four times a day (QID) | INTRAMUSCULAR | Status: DC | PRN

## 2017-09-19 MED ORDER — POTASSIUM CHLORIDE 10 MEQ/50ML IV SOLN: 10.0000 meq | Freq: Every day | INTRAVENOUS | Status: DC | PRN

## 2017-09-19 MED ORDER — INFLUENZA VAC SPLIT HIGH-DOSE 0.5 ML IM SUSY
0.5000 mL | PREFILLED_SYRINGE | Freq: Once | INTRAMUSCULAR | Status: DC
  Filled 2017-09-19: qty 0.5

## 2017-09-19 MED ORDER — LACTATED RINGERS IV SOLN
INTRAVENOUS | Status: DC | PRN
  Administered 2017-09-19: 08:00:00 via INTRAVENOUS

## 2017-09-19 MED ORDER — SODIUM CHLORIDE 0.9% FLUSH: 9.0000 mL | INTRAVENOUS | Status: DC | PRN

## 2017-09-19 MED ORDER — SUGAMMADEX SODIUM 200 MG/2ML IV SOLN
INTRAVENOUS | Status: DC | PRN
  Administered 2017-09-19: 200 mg via INTRAVENOUS

## 2017-09-19 MED ORDER — LACTATED RINGERS IV SOLN
INTRAVENOUS | Status: DC | PRN
  Administered 2017-09-19 (×2): via INTRAVENOUS

## 2017-09-19 MED ORDER — DIPHENHYDRAMINE HCL 12.5 MG/5ML PO ELIX
12.5000 mg | ORAL_SOLUTION | Freq: Four times a day (QID) | ORAL | Status: DC | PRN
  Filled 2017-09-19: qty 5

## 2017-09-19 MED ORDER — MIDAZOLAM HCL 5 MG/5ML IJ SOLN
INTRAMUSCULAR | Status: DC | PRN
  Administered 2017-09-19: 1 mg via INTRAVENOUS

## 2017-09-19 MED ORDER — PHENYLEPHRINE HCL 10 MG/ML IJ SOLN
INTRAVENOUS | Status: DC | PRN
  Administered 2017-09-19: 50 ug/min via INTRAVENOUS

## 2017-09-19 MED ORDER — PROPOFOL 10 MG/ML IV BOLUS
INTRAVENOUS | Status: DC | PRN
  Administered 2017-09-19: 100 mg via INTRAVENOUS

## 2017-09-19 MED ORDER — HYDROMORPHONE HCL 1 MG/ML IJ SOLN: 0.2500 mg | INTRAMUSCULAR | Status: DC | PRN

## 2017-09-19 MED ORDER — LIDOCAINE 2% (20 MG/ML) 5 ML SYRINGE
INTRAMUSCULAR | Status: AC
  Filled 2017-09-19: qty 5

## 2017-09-19 MED ORDER — SUGAMMADEX SODIUM 200 MG/2ML IV SOLN
INTRAVENOUS | Status: AC
  Filled 2017-09-19: qty 2

## 2017-09-19 MED ORDER — ACETAMINOPHEN 160 MG/5ML PO SOLN: 1000.0000 mg | Freq: Four times a day (QID) | ORAL | Status: AC

## 2017-09-19 MED ORDER — FENTANYL 40 MCG/ML IV SOLN
INTRAVENOUS | Status: DC
  Filled 2017-09-19: qty 25

## 2017-09-19 MED ORDER — ORAL CARE MOUTH RINSE
15.0000 mL | Freq: Two times a day (BID) | OROMUCOSAL | Status: DC
  Administered 2017-09-19 – 2017-09-26 (×5): 15 mL via OROMUCOSAL

## 2017-09-19 MED ORDER — DEXTROSE-NACL 5-0.9 % IV SOLN
INTRAVENOUS | Status: DC
  Administered 2017-09-19 – 2017-09-20 (×3): via INTRAVENOUS

## 2017-09-19 MED ORDER — ASPIRIN EC 81 MG PO TBEC
81.0000 mg | DELAYED_RELEASE_TABLET | Freq: Every day | ORAL | Status: DC
Start: 2017-09-20 — End: 2017-09-26
  Administered 2017-09-20 – 2017-09-26 (×7): 81 mg via ORAL
  Filled 2017-09-19 (×7): qty 1

## 2017-09-19 MED ORDER — ONDANSETRON HCL 4 MG/2ML IJ SOLN
4.0000 mg | Freq: Four times a day (QID) | INTRAMUSCULAR | Status: DC | PRN
Start: 2017-09-19 — End: 2017-09-19

## 2017-09-19 MED ORDER — BUPIVACAINE 0.5 % ON-Q PUMP SINGLE CATH 400 ML
400.0000 mL | INJECTION | Status: AC
  Filled 2017-09-19: qty 400

## 2017-09-19 MED ORDER — 0.9 % SODIUM CHLORIDE (POUR BTL) OPTIME
TOPICAL | Status: DC | PRN
Start: 2017-09-19 — End: 2017-09-19
  Administered 2017-09-19: 2000 mL

## 2017-09-19 MED ORDER — DEXTROSE 5 % IV SOLN
1.5000 g | Freq: Two times a day (BID) | INTRAVENOUS | Status: AC
  Administered 2017-09-19 – 2017-09-20 (×2): 1.5 g via INTRAVENOUS
  Filled 2017-09-19 (×2): qty 1.5

## 2017-09-19 MED ORDER — BISACODYL 5 MG PO TBEC
10.0000 mg | DELAYED_RELEASE_TABLET | Freq: Every day | ORAL | Status: DC
  Administered 2017-09-20 – 2017-09-26 (×4): 10 mg via ORAL
  Filled 2017-09-19 (×4): qty 2

## 2017-09-19 MED ORDER — PHENYLEPHRINE 40 MCG/ML (10ML) SYRINGE FOR IV PUSH (FOR BLOOD PRESSURE SUPPORT)
PREFILLED_SYRINGE | INTRAVENOUS | Status: AC
Start: 2017-09-19 — End: ?
  Filled 2017-09-19: qty 10

## 2017-09-19 MED ORDER — ROCURONIUM BROMIDE 100 MG/10ML IV SOLN
INTRAVENOUS | Status: DC | PRN
  Administered 2017-09-19: 60 mg via INTRAVENOUS
  Administered 2017-09-19 (×2): 20 mg via INTRAVENOUS

## 2017-09-19 MED ORDER — EPHEDRINE 5 MG/ML INJ
INTRAVENOUS | Status: AC
  Filled 2017-09-19: qty 10

## 2017-09-19 MED ORDER — DEXAMETHASONE SODIUM PHOSPHATE 10 MG/ML IJ SOLN
INTRAMUSCULAR | Status: DC | PRN
  Administered 2017-09-19: 10 mg via INTRAVENOUS

## 2017-09-19 MED ORDER — PNEUMOCOCCAL VAC POLYVALENT 25 MCG/0.5ML IJ INJ
0.5000 mL | INJECTION | Freq: Once | INTRAMUSCULAR | Status: AC
  Administered 2017-09-26: 0.5 mL via INTRAMUSCULAR
  Filled 2017-09-19: qty 0.5

## 2017-09-19 MED ORDER — DEXTROSE 5 % IV SOLN
1.5000 g | INTRAVENOUS | Status: AC
  Administered 2017-09-19: 1.5 g via INTRAVENOUS
  Filled 2017-09-19: qty 1.5

## 2017-09-19 MED ORDER — DIPHENHYDRAMINE HCL 12.5 MG/5ML PO ELIX: 12.5000 mg | ORAL_SOLUTION | Freq: Four times a day (QID) | ORAL | Status: DC | PRN

## 2017-09-19 MED ORDER — FENTANYL 40 MCG/ML IV SOLN
INTRAVENOUS | Status: DC
  Administered 2017-09-19: 4 ug via INTRAVENOUS
  Administered 2017-09-19: 1000 ug via INTRAVENOUS
  Administered 2017-09-19: 2 ug via INTRAVENOUS
  Administered 2017-09-20: 40 ug via INTRAVENOUS
  Administered 2017-09-20: 50 ug via INTRAVENOUS
  Administered 2017-09-20: 20 ug via INTRAVENOUS
  Administered 2017-09-20: 40 ug via INTRAVENOUS
  Administered 2017-09-20: 30 ug via INTRAVENOUS
  Administered 2017-09-20: 60 ug via INTRAVENOUS
  Administered 2017-09-21: 20 ug via INTRAVENOUS
  Administered 2017-09-21: 50 ug via INTRAVENOUS
  Administered 2017-09-21: 0 ug via INTRAVENOUS
  Administered 2017-09-22: 140 ug via INTRAVENOUS
  Administered 2017-09-22: 110 ug via INTRAVENOUS
  Administered 2017-09-22: 20 ug via INTRAVENOUS
  Filled 2017-09-19 (×2): qty 25

## 2017-09-19 MED ORDER — PANTOPRAZOLE SODIUM 40 MG PO TBEC
40.0000 mg | DELAYED_RELEASE_TABLET | Freq: Every day | ORAL | Status: DC
  Administered 2017-09-20 – 2017-09-25 (×6): 40 mg via ORAL
  Filled 2017-09-19 (×6): qty 1

## 2017-09-19 MED ORDER — ALBUMIN HUMAN 5 % IV SOLN
INTRAVENOUS | Status: DC | PRN
  Administered 2017-09-19: 09:00:00 via INTRAVENOUS

## 2017-09-19 MED ORDER — BUPIVACAINE HCL (PF) 0.5 % IJ SOLN
INTRAMUSCULAR | Status: AC
  Filled 2017-09-19: qty 30

## 2017-09-19 MED ORDER — BUPIVACAINE 0.5 % ON-Q PUMP SINGLE CATH 400 ML
INJECTION | Status: AC | PRN
  Administered 2017-09-19: 400 mL

## 2017-09-19 MED ORDER — LUNG SURGERY BOOK
Freq: Once | Status: AC
  Administered 2017-09-19: 20:00:00
  Filled 2017-09-19: qty 1

## 2017-09-19 MED ORDER — BUPIVACAINE HCL (PF) 0.5 % IJ SOLN
INTRAMUSCULAR | Status: DC | PRN
  Administered 2017-09-19: 30 mL

## 2017-09-19 SURGICAL SUPPLY — 97 items
ADH SKN CLS APL DERMABOND .7 (GAUZE/BANDAGES/DRESSINGS) ×1
APPLICATOR COTTON TIP 6IN STRL (MISCELLANEOUS) ×4 IMPLANT
APPLIER CLIP ROT 10 11.4 M/L (STAPLE)
APR CLP MED LRG 11.4X10 (STAPLE)
BAG SPEC RTRVL LRG 6X4 10 (ENDOMECHANICALS)
CABLE VENTRICULAR (VASCULAR PRODUCTS) ×4 IMPLANT
CANISTER SUCT 3000ML PPV (MISCELLANEOUS) ×3 IMPLANT
CATH KIT ON Q 5IN SLV (PAIN MANAGEMENT) ×2 IMPLANT
CATH THORACIC 28FR (CATHETERS) ×2 IMPLANT
CATH THORACIC 28FR RT ANG (CATHETERS) IMPLANT
CATH THORACIC 36FR (CATHETERS) IMPLANT
CATH THORACIC 36FR RT ANG (CATHETERS) IMPLANT
CLIP APPLIE ROT 10 11.4 M/L (STAPLE) IMPLANT
CLIP VESOCCLUDE MED 6/CT (CLIP) ×3 IMPLANT
CONN ST 1/4X3/8  BEN (MISCELLANEOUS)
CONN ST 1/4X3/8 BEN (MISCELLANEOUS) IMPLANT
CONN Y 3/8X3/8X3/8  BEN (MISCELLANEOUS)
CONN Y 3/8X3/8X3/8 BEN (MISCELLANEOUS) IMPLANT
CONT SPEC 4OZ CLIKSEAL STRL BL (MISCELLANEOUS) ×24 IMPLANT
COVER SURGICAL LIGHT HANDLE (MISCELLANEOUS) ×2 IMPLANT
CUTTER ECHEON FLEX ENDO 45 340 (ENDOMECHANICALS) ×2 IMPLANT
DERMABOND ADVANCED (GAUZE/BANDAGES/DRESSINGS) ×2
DERMABOND ADVANCED .7 DNX12 (GAUZE/BANDAGES/DRESSINGS) IMPLANT
DRAIN CHANNEL 28F RND 3/8 FF (WOUND CARE) IMPLANT
DRAIN CHANNEL 32F RND 10.7 FF (WOUND CARE) IMPLANT
DRAPE LAPAROSCOPIC ABDOMINAL (DRAPES) ×3 IMPLANT
DRAPE WARM FLUID 44X44 (DRAPE) ×3 IMPLANT
ELECT BLADE 6.5 EXT (BLADE) ×3 IMPLANT
ELECT REM PT RETURN 9FT ADLT (ELECTROSURGICAL) ×3
ELECTRODE REM PT RTRN 9FT ADLT (ELECTROSURGICAL) ×1 IMPLANT
GAUZE SPONGE 4X4 12PLY STRL (GAUZE/BANDAGES/DRESSINGS) ×3 IMPLANT
GAUZE SPONGE 4X4 12PLY STRL LF (GAUZE/BANDAGES/DRESSINGS) ×2 IMPLANT
GLOVE BIO SURGEON STRL SZ 6.5 (GLOVE) ×3 IMPLANT
GLOVE BIO SURGEON STRL SZ7.5 (GLOVE) ×2 IMPLANT
GLOVE BIO SURGEONS STRL SZ 6.5 (GLOVE) ×3
GLOVE SURG SIGNA 7.5 PF LTX (GLOVE) ×6 IMPLANT
GOWN STRL REUS W/ TWL LRG LVL3 (GOWN DISPOSABLE) ×2 IMPLANT
GOWN STRL REUS W/ TWL XL LVL3 (GOWN DISPOSABLE) ×1 IMPLANT
GOWN STRL REUS W/TWL LRG LVL3 (GOWN DISPOSABLE) ×12
GOWN STRL REUS W/TWL XL LVL3 (GOWN DISPOSABLE) ×3
HEMOSTAT SURGICEL 2X14 (HEMOSTASIS) IMPLANT
KIT BASIN OR (CUSTOM PROCEDURE TRAY) ×3 IMPLANT
KIT ROOM TURNOVER OR (KITS) ×3 IMPLANT
KIT SUCTION CATH 14FR (SUCTIONS) IMPLANT
NS IRRIG 1000ML POUR BTL (IV SOLUTION) ×9 IMPLANT
PACK CHEST (CUSTOM PROCEDURE TRAY) ×3 IMPLANT
PAD ARMBOARD 7.5X6 YLW CONV (MISCELLANEOUS) ×6 IMPLANT
POUCH ENDO CATCH II 15MM (MISCELLANEOUS) ×2 IMPLANT
POUCH SPECIMEN RETRIEVAL 10MM (ENDOMECHANICALS) IMPLANT
RELOAD STAPLE 35X2.5 WHT THIN (STAPLE) IMPLANT
RELOAD STAPLE 45 4.1 GRN THCK (STAPLE) IMPLANT
RELOAD STAPLE 45 GOLD REG/THCK (STAPLE) IMPLANT
SCISSORS ENDO CVD 5DCS (MISCELLANEOUS) IMPLANT
SEALANT PROGEL (MISCELLANEOUS) IMPLANT
SEALANT SURG COSEAL 4ML (VASCULAR PRODUCTS) IMPLANT
SEALANT SURG COSEAL 8ML (VASCULAR PRODUCTS) IMPLANT
SHEARS HARMONIC HDI 20CM (ELECTROSURGICAL) IMPLANT
SOLUTION ANTI FOG 6CC (MISCELLANEOUS) ×3 IMPLANT
SPECIMEN JAR MEDIUM (MISCELLANEOUS) IMPLANT
SPONGE INTESTINAL PEANUT (DISPOSABLE) ×10 IMPLANT
SPONGE TONSIL 1 RF SGL (DISPOSABLE) ×3 IMPLANT
STAPLE RELOAD 2.5MM WHITE (STAPLE) ×21 IMPLANT
STAPLE RELOAD 45 GRN (STAPLE) ×1 IMPLANT
STAPLE RELOAD 45MM GOLD (STAPLE) ×6 IMPLANT
STAPLE RELOAD 45MM GREEN (STAPLE) ×3
STAPLER VASCULAR ECHELON 35 (CUTTER) ×2 IMPLANT
SUT PROLENE 4 0 RB 1 (SUTURE)
SUT PROLENE 4-0 RB1 .5 CRCL 36 (SUTURE) IMPLANT
SUT SILK  1 MH (SUTURE) ×4
SUT SILK 1 MH (SUTURE) ×2 IMPLANT
SUT SILK 1 TIES 10X30 (SUTURE) ×3 IMPLANT
SUT SILK 2 0 SH (SUTURE) IMPLANT
SUT SILK 2 0SH CR/8 30 (SUTURE) ×2 IMPLANT
SUT SILK 3 0 SH 30 (SUTURE) IMPLANT
SUT SILK 3 0 SH CR/8 (SUTURE) ×2 IMPLANT
SUT SILK 3 0SH CR/8 30 (SUTURE) ×3 IMPLANT
SUT VIC AB 1 CTX 36 (SUTURE) ×3
SUT VIC AB 1 CTX36XBRD ANBCTR (SUTURE) ×1 IMPLANT
SUT VIC AB 2-0 CTX 36 (SUTURE) ×3 IMPLANT
SUT VIC AB 2-0 UR6 27 (SUTURE) ×2 IMPLANT
SUT VIC AB 3-0 MH 27 (SUTURE) IMPLANT
SUT VIC AB 3-0 X1 27 (SUTURE) ×3 IMPLANT
SUT VICRYL 2 TP 1 (SUTURE) IMPLANT
SYR 10ML LL (SYRINGE) ×3 IMPLANT
SYR BULB IRRIGATION 50ML (SYRINGE) ×2 IMPLANT
SYSTEM SAHARA CHEST DRAIN ATS (WOUND CARE) ×3 IMPLANT
TAPE CLOTH 4X10 WHT NS (GAUZE/BANDAGES/DRESSINGS) ×3 IMPLANT
TAPE CLOTH SURG 4X10 WHT LF (GAUZE/BANDAGES/DRESSINGS) ×2 IMPLANT
TIP APPLICATOR SPRAY EXTEND 16 (VASCULAR PRODUCTS) IMPLANT
TOWEL GREEN STERILE (TOWEL DISPOSABLE) ×3 IMPLANT
TOWEL GREEN STERILE FF (TOWEL DISPOSABLE) ×3 IMPLANT
TRAY FOLEY W/METER SILVER 16FR (SET/KITS/TRAYS/PACK) ×3 IMPLANT
TROCAR BLADELESS 11MM (ENDOMECHANICALS) ×2 IMPLANT
TROCAR XCEL BLADELESS 5X75MML (TROCAR) ×3 IMPLANT
TROCAR XCEL NON-BLD 5MMX100MML (ENDOMECHANICALS) ×2 IMPLANT
TUNNELER SHEATH ON-Q 11GX8 DSP (PAIN MANAGEMENT) ×2 IMPLANT
WATER STERILE IRR 1000ML POUR (IV SOLUTION) ×5 IMPLANT

## 2017-09-19 NOTE — Anesthesia Procedure Notes (Signed)
Arterial Line Insertion Start/End12/19/2018 7:55 AM, 09/19/2017 8:05 AM Performed by: Kerby Less, CRNA, CRNA  Patient location: Pre-op. Preanesthetic checklist: patient identified, IV checked, site marked, risks and benefits discussed, surgical consent, monitors and equipment checked, pre-op evaluation, timeout performed and anesthesia consent Lidocaine 1% used for infiltration and patient sedated Left, radial was placed Catheter size: 20 G Hand hygiene performed , maximum sterile barriers used  and Seldinger technique used Allen's test indicative of satisfactory collateral circulation Attempts: 1 Procedure performed without using ultrasound guided technique. Following insertion, Biopatch and dressing applied. Post procedure assessment: normal  Patient tolerated the procedure well with no immediate complications.

## 2017-09-19 NOTE — Brief Op Note (Addendum)
09/19/2017  12:34 PM  PATIENT:  [name]  76 y.o. 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  PROCEDURE:  Procedure(s): LEFT VIDEO ASSISTED THORACOSCOPY (VATS)/ LEFT UPPER LOBECTOMY, LYMPH NODE DISECTION (Left)  SURGEON:  Surgeon(s) and Role:    * Melrose Nakayama, MD - Primary  PHYSICIAN ASSISTANT: WAYNE GOLD PA-C  ANESTHESIA:   general  EBL:  200 mL   BLOOD ADMINISTERED:none  DRAINS: 1 Chest Tube(s) in the LEFT HEMITHORAX   LOCAL MEDICATIONS USED:  MARCAINE     SPECIMEN:  Source of Specimen:  LUL, MULTIPLE LN SAMPLES  DISPOSITION OF SPECIMEN:  PATHOLOGY  COUNTS:  YES  TOURNIQUET:  * No tourniquets in log *  DICTATION: .Other Dictation: Dictation Number [id number]  PLAN OF CARE: Admit to inpatient   PATIENT DISPOSITION: PACU. STABLE   Delay start of Pharmacological VTE agent (>24hrs) due to surgical blood loss or risk of bleeding: yes  COMPLICATIONS: NO KNOWN

## 2017-09-19 NOTE — Interval H&P Note (Signed)
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 After consideration of risks, benefits and other options for treatment, the patient has consented to  Procedure(s): LEFT VIDEO ASSISTED THORACOSCOPY (VATS)/ UPPER LOBECTOMY (Left) as a surgical intervention .  The patient's history has been reviewed, patient examined, no change in status, stable for surgery.  I have reviewed the patient's chart and labs.  Questions were answered to the patient's satisfaction.     Nathaniel Long

## 2017-09-19 NOTE — Progress Notes (Signed)
Charge CRNA Nira Conn made aware of pt needing an ABG draw.

## 2017-09-19 NOTE — Progress Notes (Signed)
Pt Chest tubes dressing was soaked with drainage at beginning of shift, inform Dr.Gerhardt who came and had a look at it and me to redress it.

## 2017-09-19 NOTE — Anesthesia Procedure Notes (Signed)
Central Venous Catheter Insertion Performed by: Roderic Palau, MD, anesthesiologist Start/End12/19/2018 7:50 AM, 09/19/2017 8:00 AM Patient location: Pre-op. Preanesthetic checklist: patient identified, IV checked, site marked, risks and benefits discussed, surgical consent, monitors and equipment checked, pre-op evaluation, timeout performed and anesthesia consent Position: Trendelenburg Lidocaine 1% used for infiltration and patient sedated Hand hygiene performed , maximum sterile barriers used  and Seldinger technique used Catheter size: 8 Fr Total catheter length 16. Central line was placed.Double lumen Procedure performed using ultrasound guided technique. Ultrasound Notes:anatomy identified, needle tip was noted to be adjacent to the nerve/plexus identified, no ultrasound evidence of intravascular and/or intraneural injection and image(s) printed for medical record Attempts: 1 Following insertion, dressing applied, line sutured and Biopatch. Post procedure assessment: blood return through all ports  Patient tolerated the procedure well with no immediate complications.

## 2017-09-19 NOTE — Transfer of Care (Signed)
Immediate Anesthesia Transfer of Care Note  Patient: Nathaniel Long  Procedure(s) Performed: LEFT VIDEO ASSISTED THORACOSCOPY (VATS)/ LEFT UPPER LOBECTOMY, LYMPH NODE DISECTION (Left Chest)  Patient Location: PACU  Anesthesia Type:General  Level of Consciousness: awake, alert , patient cooperative and responds to stimulation  Airway & Oxygen Therapy: Patient Spontanous Breathing and Patient connected to nasal cannula oxygen  Post-op Assessment: Report given to RN, Post -op Vital signs reviewed and stable and Patient moving all extremities  Post vital signs: Reviewed and stable  Last Vitals:  Vitals:   09/19/17 0645 09/19/17 0707  BP: (!) 180/85 (!) 172/78  Pulse: 64 62  Resp: 18   Temp: (!) 36.4 C   SpO2: 98%     Last Pain:  Vitals:   09/19/17 0706  TempSrc:   PainSc: 0-No pain      Patients Stated Pain Goal: 3 (76/15/18 3437)  Complications: No apparent anesthesia complications

## 2017-09-19 NOTE — Anesthesia Postprocedure Evaluation (Signed)
Anesthesia Post Note  Patient: Nathaniel Long  Procedure(s) Performed: LEFT VIDEO ASSISTED THORACOSCOPY (VATS)/ LEFT UPPER LOBECTOMY, LYMPH NODE DISECTION (Left Chest)     Patient location during evaluation: PACU Anesthesia Type: General Level of consciousness: awake and alert Pain management: pain level controlled Vital Signs Assessment: post-procedure vital signs reviewed and stable Respiratory status: spontaneous breathing, nonlabored ventilation, respiratory function stable and patient connected to nasal cannula oxygen Cardiovascular status: blood pressure returned to baseline and stable Postop Assessment: no apparent nausea or vomiting Anesthetic complications: no    Last Vitals:  Vitals:   09/19/17 1445 09/19/17 1500  BP: (!) 161/70 (!) 161/70  Pulse: 78 80  Resp: 13 19  Temp:  (!) 36.4 C  SpO2: 98% 97%    Last Pain:  Vitals:   09/19/17 1330  TempSrc:   PainSc: Asleep                 Cheray Pardi,W. EDMOND

## 2017-09-19 NOTE — Anesthesia Procedure Notes (Signed)
Arterial Line Insertion Start/End12/19/2018 2:30 PM, 09/19/2017 2:35 PM Performed by: Colin Benton, CRNA, CRNA  Patient location: PACU. Preanesthetic checklist: patient identified, IV checked, site marked, risks and benefits discussed, surgical consent, monitors and equipment checked, pre-op evaluation, timeout performed and anesthesia consent Lidocaine 1% used for infiltration Left, radial was placed Catheter size: 20 G Hand hygiene performed , maximum sterile barriers used  and Seldinger technique used Allen's test indicative of satisfactory collateral circulation Attempts: 1 Procedure performed without using ultrasound guided technique. Following insertion, dressing applied and Biopatch. Post procedure assessment: normal and unchanged  Patient tolerated the procedure well with no immediate complications.

## 2017-09-19 NOTE — Anesthesia Procedure Notes (Signed)
Procedure Name: Intubation Date/Time: 09/19/2017 8:48 AM Performed by: Kerby Less, CRNA Pre-anesthesia Checklist: Patient identified, Emergency Drugs available, Suction available and Patient being monitored Patient Re-evaluated:Patient Re-evaluated prior to induction Oxygen Delivery Method: Circle System Utilized Preoxygenation: Pre-oxygenation with 100% oxygen Induction Type: IV induction Ventilation: Mask ventilation without difficulty Laryngoscope Size: Mac and 4 Grade View: Grade II Endobronchial tube: Double lumen EBT, Left, EBT position confirmed by auscultation and EBT position confirmed by fiberoptic bronchoscope and 39 Fr Number of attempts: 1 Airway Equipment and Method: Stylet and Oral airway Placement Confirmation: ETT inserted through vocal cords under direct vision,  positive ETCO2 and breath sounds checked- equal and bilateral Secured at: 29.5 cm Tube secured with: Tape Dental Injury: Teeth and Oropharynx as per pre-operative assessment

## 2017-09-19 NOTE — Progress Notes (Signed)
Patient ID: ARACELI COUFAL, male   DOB: Dec 14, 1940, 76 y.o.   MRN: [id number] EVENING ROUNDS NOTE :     [address]       [place] [address]             [phone]                 Day of Surgery Procedure(s) (LRB): LEFT VIDEO ASSISTED THORACOSCOPY (VATS)/ LEFT UPPER LOBECTOMY, LYMPH NODE DISECTION (Left)  Total Length of Stay:  LOS: 0 days  BP (!) 157/74   Pulse 82   Temp (!) 97.5 F (36.4 C) (Oral)   Resp 13   Ht 5' 10.5" (1.791 m)   Wt 189 lb (85.7 kg)   SpO2 98%   BMI 26.74 kg/m   .Intake/Output      12/19 0701 - 12/20 0700   P.O. 500   I.V. (mL/kg) 2004.2 (23.4)   Other 125   IV Piggyback 250   Total Intake(mL/kg) 2879.2 (33.6)   Urine (mL/kg/hr) 1125 (1)   Blood 200   Chest Tube 140   Total Output 1465   Net +1414.2         . bupivacaine 0.5 % ON-Q pump SINGLE CATH 400 mL    . cefUROXime (ZINACEF)  IV    . dextrose 5 % and 0.9% NaCl 125 mL/hr at 09/19/17 1558  . potassium chloride       Lab Results  Component Value Date   WBC 9.7 09/17/2017   HGB 13.3 09/17/2017   HCT 40.5 09/17/2017   PLT 362 09/17/2017   GLUCOSE 119 (H) 09/17/2017   CHOL 129 02/21/2012   TRIG 129 02/21/2012   HDL 34 (L) 02/21/2012   LDLCALC 69 02/21/2012   ALT 19 09/17/2017   AST 24 09/17/2017   NA 136 09/17/2017   K 4.0 09/17/2017   CL 107 09/17/2017   CREATININE 1.52 (H) 09/17/2017   BUN 14 09/17/2017   CO2 22 09/17/2017   TSH 3.777 02/21/2012   INR 0.99 09/17/2017   HGBA1C 6.0 02/21/2012   Stable from RR, 1+ air leak Stable   Grace Isaac MD  Beeper [phone] Office [phone] 09/19/2017 7:43 PM

## 2017-09-19 NOTE — Progress Notes (Addendum)
Upon assessment, patient arterial line was leaking. Attempting to fix leak and clean area, arterial line came out. IV was removed as well. New peripheral IV inserted. Dr. Roxan Hockey made aware arterial line removed. States he wants another one placed. Lead CRNA made aware and arterial line placed.

## 2017-09-19 NOTE — Plan of Care (Signed)
  Activity: Risk for activity intolerance will decrease 09/19/2017 1836 - Progressing by Jenne Campus, RN   Nutrition: Adequate nutrition will be maintained 09/19/2017 1836 - Progressing by Jenne Campus, RN   Coping: Level of anxiety will decrease 09/19/2017 1836 - Progressing by Jenne Campus, RN

## 2017-09-20 ENCOUNTER — Encounter (HOSPITAL_COMMUNITY): Payer: Self-pay | Admitting: Thoracic Surgery (Cardiothoracic Vascular Surgery)

## 2017-09-20 ENCOUNTER — Inpatient Hospital Stay (HOSPITAL_COMMUNITY): Payer: Medicare Other

## 2017-09-20 LAB — BLOOD GAS, ARTERIAL
Acid-base deficit: 3 mmol/L — ABNORMAL HIGH (ref 0.0–2.0)
Bicarbonate: 21.8 mmol/L (ref 20.0–28.0)
Drawn by: 511331
O2 CONTENT: 2 L/min
O2 SAT: 98.2 %
PCO2 ART: 40.6 mmHg (ref 32.0–48.0)
PH ART: 7.348 — AB (ref 7.350–7.450)
PO2 ART: 109 mmHg — AB (ref 83.0–108.0)
Patient temperature: 98.6

## 2017-09-20 LAB — BASIC METABOLIC PANEL
ANION GAP: 9 (ref 5–15)
BUN: 10 mg/dL (ref 6–20)
CHLORIDE: 106 mmol/L (ref 101–111)
CO2: 20 mmol/L — AB (ref 22–32)
Calcium: 8.5 mg/dL — ABNORMAL LOW (ref 8.9–10.3)
Creatinine, Ser: 1.29 mg/dL — ABNORMAL HIGH (ref 0.61–1.24)
GFR calc non Af Amer: 52 mL/min — ABNORMAL LOW (ref >60)
GLUCOSE: 154 mg/dL — AB (ref 65–99)
POTASSIUM: 4.1 mmol/L (ref 3.5–5.1)
Sodium: 135 mmol/L (ref 135–145)

## 2017-09-20 LAB — CBC
HCT: 35 % — ABNORMAL LOW (ref 39.0–52.0)
HEMOGLOBIN: 11.9 g/dL — AB (ref 13.0–17.0)
MCH: 31.5 pg (ref 26.0–34.0)
MCHC: 34 g/dL (ref 30.0–36.0)
MCV: 92.6 fL (ref 78.0–100.0)
Platelets: 304 10*3/uL (ref 150–400)
RBC: 3.78 MIL/uL — ABNORMAL LOW (ref 4.22–5.81)
RDW: 13.9 % (ref 11.5–15.5)
WBC: 17.6 10*3/uL — ABNORMAL HIGH (ref 4.0–10.5)

## 2017-09-20 MED ORDER — LABETALOL HCL 5 MG/ML IV SOLN: 10.0000 mg | INTRAVENOUS | Status: DC | PRN

## 2017-09-20 MED ORDER — ENOXAPARIN SODIUM 40 MG/0.4ML ~~LOC~~ SOLN
40.0000 mg | SUBCUTANEOUS | Status: DC
  Administered 2017-09-20 – 2017-09-26 (×7): 40 mg via SUBCUTANEOUS
  Filled 2017-09-20 (×7): qty 0.4

## 2017-09-20 MED ORDER — SODIUM CHLORIDE 0.45 % IV SOLN
INTRAVENOUS | Status: DC
  Administered 2017-09-20 – 2017-09-21 (×2): via INTRAVENOUS

## 2017-09-20 MED ORDER — CALCIUM CARBONATE ANTACID 500 MG PO CHEW
1.0000 | CHEWABLE_TABLET | Freq: Four times a day (QID) | ORAL | Status: DC | PRN
  Filled 2017-09-20: qty 1

## 2017-09-20 MED ORDER — ALPRAZOLAM 0.5 MG PO TABS: 0.5000 mg | ORAL_TABLET | Freq: Every evening | ORAL | Status: DC | PRN

## 2017-09-20 MED ORDER — UMECLIDINIUM-VILANTEROL 62.5-25 MCG/INH IN AEPB
1.0000 | INHALATION_SPRAY | Freq: Every day | RESPIRATORY_TRACT | Status: DC
  Administered 2017-09-20 – 2017-09-26 (×6): 1 via RESPIRATORY_TRACT
  Filled 2017-09-20: qty 14

## 2017-09-20 MED ORDER — HYDRALAZINE HCL 20 MG/ML IJ SOLN
10.0000 mg | Freq: Four times a day (QID) | INTRAMUSCULAR | Status: DC | PRN
  Administered 2017-09-20 – 2017-09-21 (×2): 10 mg via INTRAVENOUS
  Filled 2017-09-20 (×2): qty 1

## 2017-09-20 MED ORDER — ALPRAZOLAM 0.5 MG PO TABS: 0.5000 mg | ORAL_TABLET | Freq: Every day | ORAL | Status: DC

## 2017-09-20 NOTE — Plan of Care (Signed)
  Education: Knowledge of General Education information will improve 09/20/2017 1123 - Progressing by Jenne Campus, RN   Activity: Risk for activity intolerance will decrease 09/20/2017 1123 - Progressing by Jenne Campus, RN   Nutrition: Adequate nutrition will be maintained 09/20/2017 1123 - Progressing by Jenne Campus, RN   Coping: Level of anxiety will decrease 09/20/2017 1123 - Progressing by Jenne Campus, RN   Elimination: Will not experience complications related to bowel motility 09/20/2017 1123 - Progressing by Jenne Campus, RN   Pain Managment: General experience of comfort will improve 09/20/2017 1123 - Progressing by Jenne Campus, RN

## 2017-09-20 NOTE — Progress Notes (Signed)
Pt transferred  to [facility] room 13 pt son accompanied pt. Whitney RN met in room. Pt has one hearing aid wife has the 2nd hearing aid.

## 2017-09-20 NOTE — Care Management Note (Signed)
Case Management Note  Patient Details  Name: Nathaniel Long MRN: [id number] Date of Birth: 1941/01/05  Subjective/Objective:    From home with wife, who will be with him 24/7 and able to assist., he is post of L VATS and lobectomy, has chest tube , conts on ivf's, fentanyl pca.                  Action/Plan: NCM will follow for dc needs.  Expected Discharge Date:                  Expected Discharge Plan:     In-House Referral:     Discharge planning Services  CM Consult  Post Acute Care Choice:    Choice offered to:     DME Arranged:    DME Agency:     HH Arranged:    HH Agency:     Status of Service:  In process, will continue to follow  If discussed at Long Length of Stay Meetings, dates discussed:    Additional Comments:  Zenon Mayo, RN 09/20/2017, 12:35 PM

## 2017-09-20 NOTE — Op Note (Signed)
NAME:  Nathaniel Long, Nathaniel Long                     ACCOUNT NO.:  MEDICAL RECORD NO.:  [id number]  LOCATION:                                 FACILITY:  PHYSICIAN:  Revonda Standard. Roxan Hockey, M.D.DATE OF BIRTH:  09/27/1941  DATE OF PROCEDURE:  09/19/2017 DATE OF DISCHARGE:                              OPERATIVE REPORT   PREOPERATIVE DIAGNOSIS:  Adenocarcinoma, left upper lobe, clinical stage IA.  POSTOPERATIVE DIAGNOSIS:  Adenocarcinoma, left upper lobe, clinical stage IA.  PROCEDURES:   Left video-assisted thoracoscopy, Thoracoscopic left upper lobectomy, Mediastinal lymph node dissection,  On-Q local anesthetic catheter placement.  SURGEON:  Revonda Standard. Roxan Hockey, MD.  ASSISTANT:  Jadene Pierini, PA-C.  ANESTHESIA:  General.  FINDINGS:  Dense adhesions of the lower lobe inferiorly and posteriorly, relatively complete fissure.  Bronchial margin negative for tumor.  CLINICAL NOTE:  Nathaniel Long is a 76 year old man with a history of tobacco abuse, who recently was found to have a spiculated left upper lobe nodule.  On PET there was moderate uptake with an SUV of 5.5.  There was borderline uptake in hilar and mediastinal lymph nodes.  Navigational bronchoscopy and endobronchial ultrasound were performed.  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  Lymph node aspirations showed lymphocytes, but no tumor.  The patient was offered surgical resection versus stereotactic radiation.  The relative advantages and disadvantages of each approach were discussed.  The indications, risks, benefits, and alternatives from a surgical perspective were discussed in detail with the patient.  He understood and accepted the risks and agreed to proceed.  OPERATIVE NOTE:  Nathaniel Long was brought to the preoperative holding area on September 19, 2017.  Anesthesia established intravenous access and placed an arterial blood pressure monitoring line.  He was taken to the operating room, anesthetized, and intubated.  A  Foley catheter was placed.  Intravenous antibiotics were administered.  Sequential compression devices were placed on the calves for DVT prophylaxis.  He was placed in a right lateral decubitus position and the left chest was prepped and draped in the usual sterile fashion.  After performing a time-out, an incision was made in the seventh interspace in the midaxillary line.  A 5-mm port was inserted.  The thoracoscope was advanced into the chest.  There was good isolation of the left lung.  A 5-cm working incision was made in the fourth interspace anterolaterally.  No rib spreading was performed during the procedure.  Initial inspection showed the fissure was relatively complete.  There was an area in the midportion that was not complete, and the pleura was divided there.  Only a small portion of the fissure needed to be stapled and that was done with an Echelon 45-mm powered stapler using a gold cartridge.  The lower lobe was noted to have significant adhesions inferiorly and posteriorly.  These adhesions were taken down with electrocautery.  The inferior ligament was divided.  A level 9 lymph node was identified.  It was removed and sent for permanent pathology.  All lymph nodes that were encountered during the dissection were removed and sent as separate specimens.  The pleural reflection was divided at the hilum posteriorly.  Level 7 node was removed and sent for pathology.  This appeared relatively normal grossly.  The pleural reflection was divided at the hilum anteriorly and superiorly.  Only a small portion superiorly and posteriorly was not divided at this time.  It was later divided while dissecting out the pulmonary artery branches.  Level 11 lymph node was removed and the major fissure between the lingula and the lower lobe was completed with the Echelon stapler.  The superior pulmonary vein branches then were dissected out.  This was relatively slow process.  There was a  fibrotic reaction around the pulmonary veins.  There was a small lingular branch, which came off at an angle that would make it difficult to incorporate with division of other branches.  Therefore, it was clipped and divided. The larger lingular branch then was encircled and divided with the vascular stapler.  A more superior branch of the vein then was encircled and divided with a vascular stapler.  Finally, there was a branch that was between the pulmonary artery branch and the bronchus that could not be divided at that time.  Dissection was carried back into the fissure. There were 2 lingular artery branches, again 1 relatively small and 1 larger than normal.  These were dissected out and separately divided with the endoscopic vascular stapler.  Dissection was carried more posteriorly and a posterior segmental branch was identified.  It was dissected out, encircled, and divided with the vascular stapler.  The remaining attachments of the pleural reflection then were divided.  The anterior and apical branch of the upper lobe came off as a common trunk and this could not be approached anteriorly, so it was encircled through the fissure and divided with a stapler.  This left only the bronchus and the remaining pulmonary vein branch.  The pulmonary vein branch was divided with a vascular stapler.  It should be noted that a separate stab incision was made anterior to the original stab incision for placement of a vascular stapler while dividing the pulmonary arterial branches due to the angle.  The Echelon stapler with a green cartridge then was placed across the left upper lobe bronchus at its origin and it was closed.  A test inflation showed good aeration of the lower lobe.  The stapler then was fired transecting the left upper lobe bronchus.  The left upper lobe was placed into an endoscopic retrieval bag, removed from the chest and sent for frozen section of the bronchial margin, which  subsequently returned with no tumor seen.  Level 5 nodes had been removed during the division of the pleural reflection and exposure of the pulmonary artery.  The chest was copiously irrigated with warm saline.  A test inflation showed no leakage from the bronchial stump and good reinflation of the lower lobe.  An On-Q local anesthetic catheter was tunneled through a separate stab incision posteriorly.  It was tunneled into a subpleural location and primed with 5 mL of 0.5% Marcaine.  A 28-French chest tube was placed through the anterior port incision and advanced to the apex and secured with a #1 silk suture. The lower lobe was reinflated.  The scope was removed.  The original port incision was closed with a #1 Vicryl fascial suture and then a subcuticular skin closure.  The working incision was closed with a #1 Vicryl fascial suture followed by a 2-0 Vicryl subcutaneous suture and a 3-0 Vicryl subcuticular suture.  All sponge, needle, and instrument counts were correct at the  end of the procedure.  The patient was placed back in supine position.  The chest tube was placed to suction.  The patient was extubated in the operating room and taken to the [facility] in good condition.     Revonda Standard Roxan Hockey, M.D.     SCH/MEDQ  D:  09/19/2017  T:  09/20/2017  Job:  [id number]

## 2017-09-20 NOTE — Progress Notes (Signed)
1 Day Post-Op Procedure(s) (LRB): LEFT VIDEO ASSISTED THORACOSCOPY (VATS)/ LEFT UPPER LOBECTOMY, LYMPH NODE DISECTION (Left) Subjective: Some incisional pain  Objective: Vital signs in last 24 hours: Temp:  [97.1 F (36.2 C)-97.8 F (36.6 C)] 97.5 F (36.4 C) (12/20 1218) Pulse Rate:  [65-89] 66 (12/20 1200) Cardiac Rhythm: Normal sinus rhythm (12/20 0730) Resp:  [11-25] 11 (12/20 1200) BP: (132-194)/(56-117) 163/78 (12/20 1200) SpO2:  [86 %-99 %] 99 % (12/20 1200) Arterial Line BP: (93-169)/(50-83) 136/64 (12/20 0930) FiO2 (%):  [28 %] 28 % (12/20 1111)  Hemodynamic parameters for last 24 hours:    Intake/Output from previous day: 12/19 0701 - 12/20 0700 In: 5464.2 [P.O.:1460; I.V.:3629.2; IV Piggyback:250] Out: 3033 [Urine:2550; Blood:200; Chest Tube:283] Intake/Output this shift: Total I/O In: 402.1 [I.V.:302.1; IV Piggyback:100] Out: 901 [Urine:800; Stool:1; Chest Tube:100]  General appearance: alert, cooperative and no distress Neurologic: intact Heart: regular rate and rhythm Lungs: diminished breath sounds on left small air leak  Lab Results: Recent Labs    09/17/17 1331 09/20/17 0413  WBC 9.7 17.6*  HGB 13.3 11.9*  HCT 40.5 35.0*  PLT 362 304   BMET:  Recent Labs    09/17/17 1331 09/20/17 0413  NA 136 135  K 4.0 4.1  CL 107 106  CO2 22 20*  GLUCOSE 119* 154*  BUN 14 10  CREATININE 1.52* 1.29*  CALCIUM 9.2 8.5*    PT/INR:  Recent Labs    09/17/17 1331  LABPROT 13.0  INR 0.99   ABG    Component Value Date/Time   PHART 7.348 (L) 09/20/2017 0515   HCO3 21.8 09/20/2017 0515   ACIDBASEDEF 3.0 (H) 09/20/2017 0515   O2SAT 98.2 09/20/2017 0515   CBG (last 3)  No results for input(s): GLUCAP in the last 72 hours.  Assessment/Plan: S/P Procedure(s) (LRB): LEFT VIDEO ASSISTED THORACOSCOPY (VATS)/ LEFT UPPER LOBECTOMY, LYMPH NODE DISECTION (Left) -Doing well POD # 1   CV - hypertensive, SBP in 180s despite norvasc- will give PRN  hydralazine/ labetalol  RESP- small air leak- keep CT to suction today  IS,,nebs  RENAL- creatinine down, lytes OK- follow  ENDO- CBG mildly elevated, monitor  SCD + enoxaparin for DVT prophylaxis  Mobilize  transfer to stepdown when bed available   LOS: 1 day    Nathaniel Long 09/20/2017

## 2017-09-21 ENCOUNTER — Inpatient Hospital Stay (HOSPITAL_COMMUNITY): Payer: Medicare Other

## 2017-09-21 ENCOUNTER — Other Ambulatory Visit: Payer: Self-pay

## 2017-09-21 LAB — COMPREHENSIVE METABOLIC PANEL
ALBUMIN: 3.2 g/dL — AB (ref 3.5–5.0)
ALK PHOS: 82 U/L (ref 38–126)
ALT: 15 U/L — ABNORMAL LOW (ref 17–63)
ANION GAP: 6 (ref 5–15)
AST: 20 U/L (ref 15–41)
BUN: 13 mg/dL (ref 6–20)
CHLORIDE: 106 mmol/L (ref 101–111)
CO2: 24 mmol/L (ref 22–32)
Calcium: 8.5 mg/dL — ABNORMAL LOW (ref 8.9–10.3)
Creatinine, Ser: 1.19 mg/dL (ref 0.61–1.24)
GFR calc non Af Amer: 58 mL/min — ABNORMAL LOW (ref >60)
GLUCOSE: 109 mg/dL — AB (ref 65–99)
Potassium: 3.8 mmol/L (ref 3.5–5.1)
SODIUM: 136 mmol/L (ref 135–145)
Total Bilirubin: 0.7 mg/dL (ref 0.3–1.2)
Total Protein: 5.9 g/dL — ABNORMAL LOW (ref 6.5–8.1)

## 2017-09-21 LAB — CBC
HCT: 35.2 % — ABNORMAL LOW (ref 39.0–52.0)
HEMOGLOBIN: 11.5 g/dL — AB (ref 13.0–17.0)
MCH: 30.6 pg (ref 26.0–34.0)
MCHC: 32.7 g/dL (ref 30.0–36.0)
MCV: 93.6 fL (ref 78.0–100.0)
PLATELETS: 277 10*3/uL (ref 150–400)
RBC: 3.76 MIL/uL — AB (ref 4.22–5.81)
RDW: 14.4 % (ref 11.5–15.5)
WBC: 15.6 10*3/uL — ABNORMAL HIGH (ref 4.0–10.5)

## 2017-09-21 MED ORDER — AMLODIPINE BESYLATE 10 MG PO TABS
10.0000 mg | ORAL_TABLET | Freq: Every day | ORAL | Status: DC
  Administered 2017-09-21 – 2017-09-25 (×5): 10 mg via ORAL
  Filled 2017-09-21 (×5): qty 1

## 2017-09-21 NOTE — Progress Notes (Signed)
Dr. Roxan Hockey notified of expanding air leak in patient's chest tube.  Chest tube placed to water seal this AM and air leak has gradually been expanding throughout the day from minimal to large air leak noted.  Pt asymptomatic.  No new orders.

## 2017-09-21 NOTE — Discharge Summary (Signed)
Physician Discharge Summary  Patient ID: Nathaniel Long MRN: [id number] DOB/AGE: 05-22-1941 76 y.o.  Admit date: 09/19/2017 Discharge date: 09/26/2017  Admission Diagnoses:left lung mass  Discharge Diagnoses: Adenocarcinoma left upper lobe- Stage IA Active Problems:   S/P lobectomy of lung   Patient Active Problem List   Diagnosis Date Noted  . S/P lobectomy of lung 09/19/2017  . Malignant neoplasm of upper lobe of left lung ([place]) 09/13/2017  . Pulmonary nodule, left 09/05/2017  . Mediastinal lymphadenopathy 09/05/2017  . Multiple rib fractures 07/16/2017  . Spondylolisthesis, lumbar region 06/18/2017  . Right hip pain 05/23/2017  . Chronic bilateral low back pain with bilateral sciatica 05/23/2017  . Colon cancer screening 01/12/2017  . Antiplatelet or antithrombotic long-term use 01/12/2017  . PAD (peripheral artery disease) ([place]) 04/13/2011  . Murmur, heart 12/08/2010  . ACTINIC KERATOSIS, HEAD 07/14/2010  . BRUISED ANKLE 07/14/2010  . HYPERTENSION, BENIGN 11/19/2009  . TOBACCO ABUSE 10/28/2009  . EAR PAIN, LEFT 10/28/2009    HPI: 76 yo man with newly diagnosed left upper lobe non-small cell carcinoma.  [name] is a 76 yo man with a past history of back pain with sciatica, carotid stenosis, PAD, GERD, hearing loss, ascending aneurysm and CKD. He has a 60 py history of smoking before quitting in July 2018. He recently was working with a chain saw when either the saw kicked back or a limb hit him in the chest. 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 A PET showed moderate uptake with an SUV of 5.5. There as borderline uptake in the hilar and mediastinal lymph nodes, particularly the subcarinal node. Dr. Lamonte Sakai did an ENB and EBUS. 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 Lymph node aspirations showed lymphocytes but no tumor was seen.  He has pain from his recent injuries, but  denies any other chest pain. He denies dyspnea and wheezing. No change in appetite, weight loss, headaches or visual changes.   The patient was admitted this hospitalization for elective resection.   FINAL DIAGNOSIS Diagnosis 1. Lymph node, biopsy, L 7 - ANTHRACOTIC LYMPH NODE. - NO TUMOR IDENTIFIED. 2. Lymph node, biopsy, L 9 - ANTHRACOTIC LYMPH NODE. - NO TUMOR IDENTIFIED. 3. Lymph node, biopsy, L 10 - ANTHRACOTIC LYMPH NODE. - NO TUMOR IDENTIFIED. 4. Lymph node, biopsy, L 5 - ANTHRACOTIC LYMPH NODE. - NO TUMOR IDENTIFIED. 5. Lymph node, biopsy, L 12 - ANTHRACOTIC LYMPH NODE. - NO TUMOR IDENTIFIED. 6. Lymph node, biopsy, L 11 - ANTHRACOTIC LYMPH NODE. - NO TUMOR IDENTIFIED. 7. Lymph node, biopsy, L 12 #2 - BENIGN FIBROADIPOSE TISSUE. - NO LYMPH NODE TISSUE OR TUMOR. 8. Lymph node, biopsy, L 10 #2 - ANTHRACOTIC LYMPH NODE. - NO TUMOR IDENTIFIED. 9. Lung, resection (segmental or lobe), LUL - ADENOCARCINOMA, 1.6 CM. - MARGINS NOT INVOLVED. 10. Lymph node, biopsy, L 10 #3 - ANTHRACOTIC LYMPH NODE. - NO TUMOR IDENTIFIED. 1 of 4 FINAL for [name], SZILAGYI ([id number]) Diagnosis(continued) 63. Lymph node, biopsy, L 11 #2 - ANTHRACOTIC LYMPH NODE. - NO TUMOR IDENTIFIED. 12. Lymph node, biopsy, L 12 #3 - ANTHRACOTIC LYMPH NODE. - NO TUMOR IDENTIFIED. 13. Lymph node, biopsy, L 12 #4 - ANTHRACOTIC LYMPH NODE. - NO TUMOR IDENTIFIED. Microscopic Comment 9. LUNG Specimen, including laterality: Left upper lung lobe and eleven lymph nodes. Procedure: Lobectomy. Specimen integrity (intact/disrupted): Intact. Tumor site: Left upper lung lobe. Tumor focality: Unifocal. Maximum tumor size (  cm): 1.6 cm Histologic type: Adenocarcinoma. Grade: I Margins: Free of tumor. Distance to closest margin (cm): 7 cm from bronchial and vascular margins. Visceral pleura invasion: No tumor. Tumor extension: Invades lung parenchyma. Treatment effect (if treated with neoadjuvant therapy):  No Lymph -Vascular invasion: Not identified. Lymph nodes: Number examined - 11; Number N1 nodes positive 0; Number N2 nodes positive 0 TNM code: pT1a, pN0 Ancillary Studies: FoundationOne and PD-L 1 submitted. Best tumor block for sendout testing: 9C Non-neoplastic lung: Focal hemorrhage. (JDP:ecj 09/20/2017) Claudette Laws MD Pathologist, Electronic Signature (Case signed 09/21/2017) Intraoperative Diagnosis 9. RAPID INTRAOPERATIVE CONSULT: LEFT UPPER LOBE BRONCHIAL MARGIN: NEGATIVE FOR MALIGNANCY. (JBK) - PHYSICIAN NOT IN ROOM WHEN CALLED BACK. Specimen Gross and Clinical Information Specimen(s) Obtained: 1. Lymph node, biopsy, L 7 2 of 4 FINAL for TORRI, LANGSTON ([id number]) Specimen(s) Obtained:(continued) 2. Lymph node, biopsy, L 9 3. Lymph node, biopsy, L 10 4. Lymph node, biopsy, L 5 5. Lymph node, biopsy, L 12 6. Lymph node, biopsy, L 11 7. Lymph node, biopsy, L 12 #2 8. Lymph node, biopsy, L 10 #2 9. Lung, resection (segmental or lobe), LUL 10. Lymph node, biopsy, L 10 #3 11. Lymph node, biopsy, L 11 #2 12. Lymph node, biopsy, L 12 #3 13. Lymph node, biopsy, L 12 #4 Specimen Clinical Information 1. Non small cell carcinoma left upper lobe. (nt) Gross 1. Received in saline is a 1.1 cm anthracotic lymph node, bisected and submitted entirely in block A. 2. Received in formalin is a 0.9 cm anthracotic lymph node, bisected and submitted entirely in one cassette. 3. Received in saline is 0.5 cm anthracotic lymph node fragment, submitted entirely in block A. 4. Received in saline is a 0.7 cm aggregate of anthracotic lymph nodes, submitted entirely in block A. 5. Received in saline is a 0.7 x 0.3 x 0.2 cm anthracotic lymph node, submitted entirely in block A. 6. Received in saline is a 1.1 x 1.1 x 0.6 cm anthracotic lymph node, bisected and entirely submitted in block A. 7. Received in saline is a 0.6 x 0.5 x 0.3 cm aggregate of tan-yellow and anthracotic lymph node  fragments, submitted in block A. 8. Received in saline is a 1 cm anthracotic lymph node, bisected and submitted entirely in one cassette. 9. Received fresh for intraoperative consult is a 308 gram, 19 x 12.5 x 6.5 cm lobe of lung. The pleural surface is smooth, blue-pink, and intact. The bronchial margin is taken en face for frozen section and subsequently submitted in block 9A. Upon sectioning, the specimen is sectioned to reveal a firm stellate 1.6 x 1.2 x 1.2 cm tan mass, 2 cm to the pleural surface and 7 cm to the bronchial vascular margins. Directly adjacent to the mass, there is a 1.5 x 1.2 x 1 cm area of hyperemic softened area with adherent pleura identified. Additional masses or lesions are not grossly seen. The remaining cut surfaces reveal spongy red brown parenchyma, with patent vasculature and airways. Representative sections are submitted as follows: A = bronchial margin, frozen section remnant B = vascular margins en face C = representative mass D = mass with closest pleura E = additional hyperemic area with adherent pleura F = representative peripheral parenchyma G = representative central parenchyma. (AK:kh 09-19-17) 10. Received in formalin is a 1.1 x 0.6 x 0.4 cm aggregate of anthracotic lymph node fragments with multiple embedded silver metallic staples. Specimen is submitted entirely in block A. 11. Received in saline is a 1.1 x  0.3 x 0.2 cm anthracotic lymph node, sectioned and submitted entirely in block A. 12. Received in saline is a 0.6 x 0.5 x 0.2 cm anthracotic lymph node, entirely submitted in one cassette. 13. Received in formalin is a 1 x 0.5 x 0.5 cm anthracotic lymph node, bisected and submitted entirely in block A. (AK:ah 09/19/17) 3 of 4 FINAL for CLIDE, REMMERS ([id number]) Report signed out from the following location(s) Technical component and interpretation was performed at [facility], [place], [place] [address]. CLIA  #: [id number], 4 of  Discharged Condition: good  Hospital Course: The patient was admitted electively for resection.  On 09/19/2017 he was taken to the operating room where he underwent the below described procedure.  He tolerated it well and was taken to the surgical postanesthesia care unit in stable condition.  Postoperative hospital course:  Patient is done well.  He has maintained stable hemodynamics and sinus rhythm.  The chest tube was not removed d/t persist air leak with space and he will have miniexpress at home. Marland Kitchen  He has been followed with serial chest x-rays and the examination is stable.  He has a mild postoperative anemia.  He did have some elevation of his white blood cell count initially but it is improving over time.  His most recent BUN and creatinine are 13/1.19.  Incisions are healing well without evidence of infection.  He is tolerating diet and gradually increasing activities using standard protocols.  Oxygen has been weaned and he maintains good sats at time of discharge the patient is felt to be quite stable.  Pathology report as noted above.  Consults: None  Significant Diagnostic Studies: routine post op labs and CXR(serial)  Treatments: surgery:   DATE OF PROCEDURE:  09/19/2017 DATE OF DISCHARGE:                              OPERATIVE REPORT   PREOPERATIVE DIAGNOSIS:  Adenocarcinoma, left upper lobe, clinical stage IA.  POSTOPERATIVE DIAGNOSIS:  Adenocarcinoma, left upper lobe, clinical stage IA.  PROCEDURES:  Left video-assisted thoracoscopy, thoracoscopic left upper lobectomy, mediastinal lymph node dissection, On-Q local anesthetic catheter placement.  SURGEON:  Revonda Standard. Roxan Hockey, MD.  ASSISTANT:  Jadene Pierini, PA-C.  ANESTHESIA:  General.    Discharge Exam: Blood pressure 123/61, pulse 75, temperature 98.8 F (37.1 C), temperature source Oral, resp. rate 18, height 5' 10.5" (1.791 m), weight 199 lb 15.3 oz (90.7 kg), SpO2 92  %.   General appearance: alert, cooperative and no distress Heart: regular rate and rhythm Lungs: dim in left base Abdomen: mild distension, + BS, non tender Extremities: minor edema Wound: incis healing well   Disposition: 01-Home or Self Care  Discharge Instructions    Discharge patient   Complete by:  As directed    Please instruct wife on how to empty the mini-express   Discharge disposition:  01-Home or Self Care   Discharge patient date:  09/26/2017     Allergies as of 09/26/2017      Reactions   Gabapentin Other (See Comments)   dizziness   Statins Other (See Comments)   Muscle weakness      Medication List    TAKE these medications   acetaminophen 325 MG tablet Commonly known as:  TYLENOL Take 1 tablet (325 mg total) by mouth every 6 (six) hours as needed for moderate pain or headache.   ALPRAZolam 0.5 MG  tablet Commonly known as:  XANAX Take 1 tablet (0.5 mg total) by mouth at bedtime as needed for anxiety. What changed:    when to take this  reasons to take this   amLODipine 5 MG tablet Commonly known as:  NORVASC Take 5 mg daily by mouth.   aspirin EC 81 MG tablet Take 1 tablet (81 mg total) by mouth daily.   calcium carbonate 500 MG chewable tablet Commonly known as:  TUMS - dosed in mg elemental calcium Chew 1 tablet by mouth daily as needed for indigestion or heartburn.   CENTRUM PO Take 1 tablet by mouth every evening.   cetirizine 10 MG tablet Commonly known as:  ZYRTEC TAKE 1 TABLET BY MOUTH AT BEDTIME What changed:    how much to take  how to take this  when to take this   clotrimazole 1 % cream Commonly known as:  LOTRIMIN Apply 1 application topically 2 (two) times daily.   fish oil-omega-3 fatty acids 1000 MG capsule Take 1 g by mouth every evening.   LIVALO 4 MG Tabs Generic drug:  Pitavastatin Calcium Take 2 mg by mouth every evening.   oxyCODONE 5 MG immediate release tablet Commonly known as:  Oxy  IR/ROXICODONE Take 1 tablet (5 mg total) by mouth every 6 (six) hours as needed for moderate pain.   pantoprazole 40 MG tablet Commonly known as:  PROTONIX Take 40 mg by mouth at bedtime.   sodium chloride 0.65 % Soln nasal spray Commonly known as:  OCEAN Place 1 spray into both nostrils as needed for congestion.   umeclidinium-vilanterol 62.5-25 MCG/INH Aepb Commonly known as:  ANORO ELLIPTA Inhale 1 puff daily into the lungs.   Vitamin D 2000 units Caps Take 4,000 Units by mouth every evening.      Follow-up Information    Melrose Nakayama, MD Follow up.   Specialty:  Cardiothoracic Surgery Why:  Appointment to see his surgeon on October 16, 2017 at 2:45 PM.  Please obtain a chest x-ray at [facility] at 2:15 PM.  [facility] imaging is located in the same office complex. Contact information: [address] [place] [address] [phone]          Will arrange a 12/31 appt with Dr Roxan Hockey since home with mini-express Signed: [name] 09/26/2017, 7:52 AM

## 2017-09-21 NOTE — Progress Notes (Signed)
Nathaniel Long       Nathaniel Long [id number]             [phone]      2 Days Post-Op Procedure(s) (LRB): LEFT VIDEO ASSISTED THORACOSCOPY (VATS)/ LEFT UPPER LOBECTOMY, LYMPH NODE DISECTION (Left) Subjective: Feels well, not SOB, min pain  Objective: Vital signs in last 24 hours: Temp:  [97.5 F (36.4 C)-97.9 F (36.6 C)] 97.7 F (36.5 C) (12/21 0707) Pulse Rate:  [62-87] 63 (12/21 0707) Cardiac Rhythm: Normal sinus rhythm (12/21 0700) Resp:  [11-24] 16 (12/21 0707) BP: (128-203)/(68-117) 128/84 (12/21 0707) SpO2:  [93 %-99 %] 98 % (12/21 0707) Arterial Line BP: (136-154)/(64-66) 136/64 (12/20 0930) FiO2 (%):  [28 %] 28 % (12/20 1111)  Hemodynamic parameters for last 24 hours:    Intake/Output from previous day: 12/20 0701 - 12/21 0700 In: 2488.3 [P.O.:1182; I.V.:1206.3; IV Piggyback:100] Out: 7858 [Urine:5300; Stool:1; Chest Tube:336] Intake/Output this shift: No intake/output data recorded.  General appearance: alert, cooperative and no distress Heart: regular rate and rhythm Lungs: clear to auscultation bilaterally Abdomen: soft, mild distension, nontender Extremities: no edema, PAS in place Wound: incis healing well  Lab Results: Recent Labs    09/20/17 0413 09/21/17 0416  WBC 17.6* 15.6*  HGB 11.9* 11.5*  HCT 35.0* 35.2*  PLT 304 277   BMET:  Recent Labs    09/20/17 0413 09/21/17 0416  NA 135 136  K 4.1 3.8  CL 106 106  CO2 20* 24  GLUCOSE 154* 109*  BUN 10 13  CREATININE 1.29* 1.19  CALCIUM 8.5* 8.5*    PT/INR: No results for input(s): LABPROT, INR in the last 72 hours. ABG    Component Value Date/Time   PHART 7.348 (L) 09/20/2017 0515   HCO3 21.8 09/20/2017 0515   ACIDBASEDEF 3.0 (H) 09/20/2017 0515   O2SAT 98.2 09/20/2017 0515   CBG (last 3)  No results for input(s): GLUCAP in the last 72 hours.  Meds Scheduled Meds: . acetaminophen  1,000 mg Oral Q6H   Or  . acetaminophen (TYLENOL) oral liquid 160 mg/5 mL   1,000 mg Oral Q6H  . amLODipine  5 mg Oral Daily  . aspirin EC  81 mg Oral Daily  . bisacodyl  10 mg Oral Daily  . enoxaparin (LOVENOX) injection  40 mg Subcutaneous Q24H  . fentaNYL   Intravenous Q4H  . Influenza vac split quadrivalent PF  0.5 mL Intramuscular Once  . mouth rinse  15 mL Mouth Rinse BID  . pantoprazole  40 mg Oral QHS  . pneumococcal 23 valent vaccine  0.5 mL Intramuscular Once  . senna-docusate  1 tablet Oral QHS  . umeclidinium-vilanterol  1 puff Inhalation Daily   Continuous Infusions: . sodium chloride 50 mL/hr at 09/21/17 0601  . potassium chloride     PRN Meds:.ALPRAZolam, calcium carbonate, diphenhydrAMINE **OR** diphenhydrAMINE, hydrALAZINE, labetalol, naloxone **AND** sodium chloride flush, ondansetron (ZOFRAN) IV, potassium chloride  Xrays Dg Chest Port 1 View  Result Date: 09/20/2017 CLINICAL DATA:  Status post VATS, chest tube, pneumothorax EXAM: PORTABLE CHEST 1 VIEW COMPARISON:  09/19/2017 FINDINGS: Given slight changes in projection, there is a stable left pneumothorax, estimated at 15-20%. Slight retraction of the left chest tube. Bibasilar atelectasis persist. Slightly lower lung volumes compared to yesterday. Trachea is midline. Stable cardiomegaly with central vascular congestion. Right IJ central line tip at the lower SVC level. Atherosclerosis noted of the aorta. IMPRESSION: Lower lung volumes with increased basilar atelectasis. Stable  left apical pneumothorax. Electronically Signed   By: Nathaniel Mages.  Long M.D.   On: 09/20/2017 08:26   Dg Chest Port 1 View  Result Date: 09/19/2017 CLINICAL DATA:  Status post left upper lobectomy. EXAM: PORTABLE CHEST 1 VIEW COMPARISON:  Preop study September 17, 2017 FINDINGS: There is an approximately 15% the left-sided chest tube tip projects over the costovertebral angle of the left fourth rib. There is no pleural effusion. There is minimal atelectasis at the left lung base. The right lung is clear. The cardiac silhouette  is mildly enlarged. The pulmonary vascularity is not engorged. There is calcification in the wall of the aortic arch. The right internal jugular venous catheter tip projects over the midportion of the SVC. Left apical pneumothorax IMPRESSION: There is a 15% left apical pneumothorax. A left chest tube lies medially at approximately the same level as the pleural line. There is no mediastinal shift. Mild cardiomegaly without significant pulmonary edema. Thoracic aortic atherosclerosis. These results will be called to the ordering clinician or representative by the Radiologist Assistant, and communication documented in the PACS or zVision Dashboard. Electronically Signed   By: Nathaniel  [place] M.D.   On: 09/19/2017 13:19    Assessment/Plan: S/P Procedure(s) (LRB): LEFT VIDEO ASSISTED THORACOSCOPY (VATS)/ LEFT UPPER LOBECTOMY, LYMPH NODE DISECTION (Left)  1 doing well 2 CT- minor air leak, place to H2O seal 3 HTN at times- increase norvasc dose, may need further management if not controlled 4 leukocytosis improved, H/H stable 5 creat improved, reduce IVF rate 6 routine rehab/pulm toilet 7 final path pending  LOS: 2 days    Nathaniel Long 09/21/2017

## 2017-09-22 ENCOUNTER — Inpatient Hospital Stay (HOSPITAL_COMMUNITY): Payer: Medicare Other

## 2017-09-22 LAB — BASIC METABOLIC PANEL
Anion gap: 6 (ref 5–15)
BUN: 13 mg/dL (ref 6–20)
CALCIUM: 8.5 mg/dL — AB (ref 8.9–10.3)
CO2: 24 mmol/L (ref 22–32)
CREATININE: 1.21 mg/dL (ref 0.61–1.24)
Chloride: 103 mmol/L (ref 101–111)
GFR calc Af Amer: >60 mL/min (ref >60)
GFR, EST NON AFRICAN AMERICAN: 56 mL/min — AB (ref >60)
GLUCOSE: 116 mg/dL — AB (ref 65–99)
Potassium: 3.5 mmol/L (ref 3.5–5.1)
SODIUM: 133 mmol/L — AB (ref 135–145)

## 2017-09-22 LAB — CBC
HCT: 35.9 % — ABNORMAL LOW (ref 39.0–52.0)
Hemoglobin: 11.9 g/dL — ABNORMAL LOW (ref 13.0–17.0)
MCH: 30.9 pg (ref 26.0–34.0)
MCHC: 33.1 g/dL (ref 30.0–36.0)
MCV: 93.2 fL (ref 78.0–100.0)
PLATELETS: 293 10*3/uL (ref 150–400)
RBC: 3.85 MIL/uL — ABNORMAL LOW (ref 4.22–5.81)
RDW: 14.4 % (ref 11.5–15.5)
WBC: 14.2 10*3/uL — ABNORMAL HIGH (ref 4.0–10.5)

## 2017-09-22 MED ORDER — LACTULOSE 10 GM/15ML PO SOLN
20.0000 g | Freq: Once | ORAL | Status: AC
  Administered 2017-09-22: 20 g via ORAL
  Filled 2017-09-22: qty 30

## 2017-09-22 MED ORDER — OXYCODONE HCL 5 MG PO TABS: 5.0000 mg | ORAL_TABLET | ORAL | Status: DC | PRN

## 2017-09-22 MED ORDER — OXYCODONE HCL 5 MG PO TABS
10.0000 mg | ORAL_TABLET | ORAL | Status: DC | PRN
  Administered 2017-09-22 – 2017-09-26 (×18): 10 mg via ORAL
  Filled 2017-09-22 (×18): qty 2

## 2017-09-22 MED ORDER — OXYCODONE HCL 5 MG PO TABS: 10.0000 mg | ORAL_TABLET | ORAL | Status: DC | PRN

## 2017-09-22 NOTE — Progress Notes (Addendum)
      [address]       ,[place] [address]             [phone]      3 Days Post-Op Procedure(s) (LRB): LEFT VIDEO ASSISTED THORACOSCOPY (VATS)/ LEFT UPPER LOBECTOMY, LYMPH NODE DISECTION (Left)   Subjective:  Patient complains of a lot of gas pain.  He has no moved his bowels  He also states the pain is worse along his left chest.    Objective: Vital signs in last 24 hours: Temp:  [97.7 F (36.5 C)-98.3 F (36.8 C)] 98.3 F (36.8 C) (12/22 0721) Pulse Rate:  [67-70] 68 (12/22 0721) Cardiac Rhythm: Normal sinus rhythm (12/22 0748) Resp:  [17-22] 19 (12/22 0748) BP: (111-161)/(77-85) 117/78 (12/22 0721) SpO2:  [96 %-98 %] 96 % (12/22 0812)  Intake/Output from previous day: 12/21 0701 - 12/22 0700 In: 840 [P.O.:840] Out: 2355 [Urine:2225; Chest Tube:130] Intake/Output this shift: Total I/O In: 838 [P.O.:240; I.V.:598] Out: 300 [Urine:300]  General appearance: alert, cooperative and no distress Heart: regular rate and rhythm Lungs: clear to auscultation bilaterally Abdomen: soft, non-tender; bowel sounds normal; no masses,  no organomegaly Extremities: extremities normal, atraumatic, no cyanosis or edema Wound: clean and dyr  Lab Results: Recent Labs    09/21/17 0416 09/22/17 0320  WBC 15.6* 14.2*  HGB 11.5* 11.9*  HCT 35.2* 35.9*  PLT 277 293   BMET:  Recent Labs    09/21/17 0416 09/22/17 0320  NA 136 133*  K 3.8 3.5  CL 106 103  CO2 24 24  GLUCOSE 109* 116*  BUN 13 13  CREATININE 1.19 1.21  CALCIUM 8.5* 8.5*    PT/INR: No results for input(s): LABPROT, INR in the last 72 hours. ABG    Component Value Date/Time   PHART 7.348 (L) 09/20/2017 0515   HCO3 21.8 09/20/2017 0515   ACIDBASEDEF 3.0 (H) 09/20/2017 0515   O2SAT 98.2 09/20/2017 0515   CBG (last 3)  No results for input(s): GLUCAP in the last 72 hours.  Assessment/Plan: S/P Procedure(s) (LRB): LEFT VIDEO ASSISTED THORACOSCOPY (VATS)/ LEFT UPPER LOBECTOMY, LYMPH  NODE DISECTION (Left)  1. Chest tube- minimal output, questionable minimal air leak vs tidaling... CXR with increase in sub q emphysema and apical space... Leave will chest tube on water seal today 2. CV- HTN at times, continue Norvasc 3. GI- constipation, gas pains... Will order lactulose 4. Renal- creatinine is stable 5. Lovenox for DVT prophylaxis 6. Dispo- patient stable, leave chest tube on water seal today, repeat CXR, lactulose for bowels   LOS: 3 days    Ellwood Handler 09/22/2017 Patient seen and examined agree with above Questionable small air leak, large tidal variation Path T1N0- stage IA Informed by RN that he is not using PCA consistently- having to be prompted, will likely do better with PO meds  Remo Lipps C. Roxan Hockey, MD [facility] [phone]

## 2017-09-22 NOTE — Progress Notes (Signed)
Pt ambulated to the bathroom and then in the hall. Pt taken off O2 and sats dropped to 86%. 2liters put back on pt to ambulate, pt's sats were 95% with O2. Pt used a walker and tolerated well. Pt complaining of pain in left chest at incision site, encouraged PCA use.

## 2017-09-23 ENCOUNTER — Inpatient Hospital Stay (HOSPITAL_COMMUNITY): Payer: Medicare Other

## 2017-09-23 NOTE — Progress Notes (Addendum)
      [address]       [place] [address]             [phone]      4 Days Post-Op Procedure(s) (LRB): LEFT VIDEO ASSISTED THORACOSCOPY (VATS)/ LEFT UPPER LOBECTOMY, LYMPH NODE DISECTION (Left)   Subjective:  No new complaints.  Gas pains better after he moved his bowels.  Objective: Vital signs in last 24 hours: Temp:  [97.5 F (36.4 C)-98.1 F (36.7 C)] 98.1 F (36.7 C) (12/23 0820) Pulse Rate:  [65-74] 68 (12/23 0820) Cardiac Rhythm: Normal sinus rhythm (12/23 0900) Resp:  [14-18] 15 (12/23 0820) BP: (92-115)/(63-91) 114/91 (12/23 0820) SpO2:  [93 %-96 %] 95 % (12/23 0822)  Intake/Output from previous day: 12/22 0701 - 12/23 0700 In: 1131.7 [P.O.:480; I.V.:651.7] Out: 575 [Urine:500; Chest Tube:75] Intake/Output this shift: Total I/O In: 240 [P.O.:240] Out: 200 [Urine:200]  General appearance: alert, cooperative and no distress Heart: regular rate and rhythm Lungs: clear to auscultation bilaterally Abdomen: soft, non-tender; bowel sounds normal; no masses,  no organomegaly Extremities: extremities normal, atraumatic, no cyanosis or edema Wound: clean and dry  Lab Results: Recent Labs    09/21/17 0416 09/22/17 0320  WBC 15.6* 14.2*  HGB 11.5* 11.9*  HCT 35.2* 35.9*  PLT 277 293   BMET:  Recent Labs    09/21/17 0416 09/22/17 0320  NA 136 133*  K 3.8 3.5  CL 106 103  CO2 24 24  GLUCOSE 109* 116*  BUN 13 13  CREATININE 1.19 1.21  CALCIUM 8.5* 8.5*    PT/INR: No results for input(s): LABPROT, INR in the last 72 hours. ABG    Component Value Date/Time   PHART 7.348 (L) 09/20/2017 0515   HCO3 21.8 09/20/2017 0515   ACIDBASEDEF 3.0 (H) 09/20/2017 0515   O2SAT 98.2 09/20/2017 0515   CBG (last 3)  No results for input(s): GLUCAP in the last 72 hours.  Assessment/Plan: S/P Procedure(s) (LRB): LEFT VIDEO ASSISTED THORACOSCOPY (VATS)/ LEFT UPPER LOBECTOMY, LYMPH NODE DISECTION (Left)  1. Chest tube- output remains minimal,  questionable air leak vs. tidaling- chest tube clamped by Dr. Roxan Hockey... CXR has been stable 2. CV- HTN improved, continue norvasc 3. GI- constipation resolved, gas pains improved 4. DVT prophylaxis 5. Dispo- patient stable, chest tube clamped, will repeat CXR in several hours if stable will d/c chest tube, possibly ready for d/c in AM   LOS: 4 days    Ellwood Handler 09/23/2017   Continues to have a space and large volume tidal variation in CT. I do not think there is an actual air leak but difficult to be certain Will clamp tube for 3 hours then recheck CXR, hopefully can get tube out later today  Remo Lipps C. Roxan Hockey, MD [facility] [phone]

## 2017-09-23 NOTE — Progress Notes (Signed)
CXR with tube clamped showed a larger pneumo and increased SQ emphysema. Ct unclamped and air evacuated Patient was tolerating well Leave tube to water seal  Nathaniel Long C. Roxan Hockey, MD [facility] [phone]

## 2017-09-24 NOTE — Progress Notes (Signed)
5 Days Post-Op Procedure(s) (LRB): LEFT VIDEO ASSISTED THORACOSCOPY (VATS)/ LEFT UPPER LOBECTOMY, LYMPH NODE DISECTION (Left) Subjective: No complaints  Objective: Vital signs in last 24 hours: Temp:  [97.7 F (36.5 C)-98.3 F (36.8 C)] 98 F (36.7 C) (12/24 0712) Pulse Rate:  [55-72] 72 (12/24 0834) Cardiac Rhythm: Normal sinus rhythm (12/24 0834) Resp:  [10-20] 10 (12/24 0834) BP: (91-118)/(56-70) 118/68 (12/24 0834) SpO2:  [93 %-94 %] 93 % (12/24 0350)  Hemodynamic parameters for last 24 hours:    Intake/Output from previous day: 12/23 0701 - 12/24 0700 In: 960 [P.O.:960] Out: 1000 [Urine:1000] Intake/Output this shift: No intake/output data recorded.  General appearance: alert, cooperative and no distress Neurologic: intact Heart: regular rate and rhythm Lungs: diminished breath sounds on left Wound: clean and dry + air leak  Lab Results: Recent Labs    09/22/17 0320  WBC 14.2*  HGB 11.9*  HCT 35.9*  PLT 293   BMET:  Recent Labs    09/22/17 0320  NA 133*  K 3.5  CL 103  CO2 24  GLUCOSE 116*  BUN 13  CREATININE 1.21  CALCIUM 8.5*    PT/INR: No results for input(s): LABPROT, INR in the last 72 hours. ABG    Component Value Date/Time   PHART 7.348 (L) 09/20/2017 0515   HCO3 21.8 09/20/2017 0515   ACIDBASEDEF 3.0 (H) 09/20/2017 0515   O2SAT 98.2 09/20/2017 0515   CBG (last 3)  No results for input(s): GLUCAP in the last 72 hours.  Assessment/Plan: S/P Procedure(s) (LRB): LEFT VIDEO ASSISTED THORACOSCOPY (VATS)/ LEFT UPPER LOBECTOMY, LYMPH NODE DISECTION (Left)  POD # 5 left upper lobectomy He has an apical space post lobectomy. Clamping CT yesterday showed increased pneumothorax. Tolerating water seal. Will try on a miniexpress to see if lung will stay "up". If so may be able to go home with tube tomorrow and follow up in office on 12/31. Incisional pain well controlled   LOS: 5 days    Melrose Nakayama 09/24/2017

## 2017-09-24 NOTE — Care Management Note (Signed)
Case Management Note Previous Note Created by Tomi Bamberger  Patient Details  Name: Nathaniel Long MRN: [id number] Date of Birth: 07/15/1941  Subjective/Objective:    From home with wife, who will be with him 24/7 and able to assist., he is post of L VATS and lobectomy, has chest tube , conts on ivf's, fentanyl pca.                  Action/Plan: NCM will follow for dc needs.  Expected Discharge Date:                  Expected Discharge Plan:     In-House Referral:     Discharge planning Services  CM Consult  Post Acute Care Choice:    Choice offered to:     DME Arranged:    DME Agency:     HH Arranged:    HH Agency:     Status of Service:  In process, will continue to follow  If discussed at Long Length of Stay Meetings, dates discussed:    Additional Comments: 09/24/2017 Pt transferred to 2 C, per cardiothoracic surgery may discharge home with mini express.  PTA per wife pt was independent.  CM offered choice of HH for RN with management of mini express - wife chose [facility] - CM requested orders.  CM informed [facility] of potential referral Maryclare Labrador, RN 09/24/2017, 11:24 AM

## 2017-09-24 NOTE — Progress Notes (Signed)
On Q removed and chest tube dressing changed. Removed right IJ. Pt tolerated well.

## 2017-09-24 NOTE — Progress Notes (Signed)
Chest tube [place] changed to a mini express.

## 2017-09-24 NOTE — Care Management (Signed)
Attending Please write [place] order and face to face for [facility] to manage mini express at discharge Elenor Quinones, RN, BSN [phone]

## 2017-09-25 ENCOUNTER — Inpatient Hospital Stay (HOSPITAL_COMMUNITY): Payer: Medicare Other

## 2017-09-25 NOTE — Progress Notes (Addendum)
      [address]       [place] [address]             [phone]      6 Days Post-Op Procedure(s) (LRB): LEFT VIDEO ASSISTED THORACOSCOPY (VATS)/ LEFT UPPER LOBECTOMY, LYMPH NODE DISECTION (Left) Subjective: No issues. Very HOH.   Objective: Vital signs in last 24 hours: Temp:  [97.5 F (36.4 C)-98.1 F (36.7 C)] 97.6 F (36.4 C) (12/25 0802) Pulse Rate:  [58-77] 77 (12/25 0802) Cardiac Rhythm: Normal sinus rhythm (12/25 0802) Resp:  [13-17] 17 (12/25 0802) BP: (100-125)/(62-74) 102/74 (12/25 0802) SpO2:  [90 %-97 %] 97 % (12/25 0802) Weight:  [199 lb 15.3 oz (90.7 kg)] 199 lb 15.3 oz (90.7 kg) (12/25 0400)     Intake/Output from previous day: 12/24 0701 - 12/25 0700 In: 1440 [P.O.:1440] Out: 1100 [Urine:1100] Intake/Output this shift: Total I/O In: 240 [P.O.:240] Out: -   General appearance: alert, cooperative and no distress Heart: regular rate and rhythm, S1, S2 normal, no murmur, click, rub or gallop Lungs: clear to auscultation bilaterally Abdomen: soft, non-tender; bowel sounds normal; no masses,  no organomegaly Extremities: extremities normal, atraumatic, no cyanosis or edema Wound: clean and dry  Lab Results: No results for input(s): WBC, HGB, HCT, PLT in the last 72 hours. BMET: No results for input(s): NA, K, CL, CO2, GLUCOSE, BUN, CREATININE, CALCIUM in the last 72 hours.  PT/INR: No results for input(s): LABPROT, INR in the last 72 hours. ABG    Component Value Date/Time   PHART 7.348 (L) 09/20/2017 0515   HCO3 21.8 09/20/2017 0515   ACIDBASEDEF 3.0 (H) 09/20/2017 0515   O2SAT 98.2 09/20/2017 0515   CBG (last 3)  No results for input(s): GLUCAP in the last 72 hours.  Assessment/Plan: S/P Procedure(s) (LRB): LEFT VIDEO ASSISTED THORACOSCOPY (VATS)/ LEFT UPPER LOBECTOMY, LYMPH NODE DISECTION (Left)  POD # 6 left upper lobectomy  1. Pulm-CXR showed Left chest tube remains in place with moderate-sized left pneumothorax,  stable. Increasing subcutaneous emphysema throughout the left chest wall. Bibasilar atelectasis. +airleak.  2. Pain well controlled on current regimen.   Plan: Home tomorrow with chest tube if remains stable. Follow-up xray in the morning. Follow-up appointment with Dr. Roxan Hockey on 12/31     LOS: 6 days    Elgie Collard 09/25/2017 Persistent left apical space and air leak after lobectomy Chest tube now to mini express but with significant fluid drainage around tube site Patient ready to be discharged home until drainage improves     patient examined and medical record reviewed,agree with above note. Tharon Aquas Trigt III 09/25/2017

## 2017-09-25 NOTE — Plan of Care (Signed)
Patient progressed well.  Sats remained stable through night.  One dressing change

## 2017-09-25 NOTE — Plan of Care (Signed)
Continue current care plan 

## 2017-09-26 ENCOUNTER — Inpatient Hospital Stay (HOSPITAL_COMMUNITY): Payer: Medicare Other

## 2017-09-26 MED ORDER — ALPRAZOLAM 0.5 MG PO TABS: 0.5000 mg | ORAL_TABLET | Freq: Every evening | ORAL | 0 refills | Status: DC | PRN

## 2017-09-26 MED ORDER — OXYCODONE HCL 5 MG PO TABS: 5.0000 mg | ORAL_TABLET | Freq: Four times a day (QID) | ORAL | 0 refills | Status: DC | PRN

## 2017-09-26 NOTE — Care Management Note (Addendum)
Case Management Note Previous Note Created by Tomi Bamberger  Patient Details  Name: WINFRED IIAMS MRN: [id number] Date of Birth: 1940/12/18  Subjective/Objective:    From home with wife, who will be with him 24/7 and able to assist., he is post of L VATS and lobectomy, has chest tube , conts on ivf's, fentanyl pca.                  Action/Plan: NCM will follow for dc needs.  Expected Discharge Date:  09/26/17               Expected Discharge Plan:  [place]  In-House Referral:     Discharge planning Services  CM Consult  Post Acute Care Choice:  Home Health Choice offered to:  Spouse  DME Arranged:    DME Agency:     HH Arranged:  RN [place] Agency:  [place]  Status of Service:  Completed, signed off  If discussed at [place] of Stay Meetings, dates discussed:    Additional Comments: 09/24/17 Pt transferred to 2 C, per cardiothoracic surgery may discharge home with mini express.  PTA per wife pt was independent.  CM offered choice of HH for RN with management of mini express - wife chose [facility] - CM requested orders.  CM informed [facility] of potential referral Ella Bodo, RN 09/26/2017, 10:39 AM  Pt medically stable for discharge home today with spouse.  Order received for [facility] for mini-express management.  Bedside nurse to instruct wife on mini-express care prior to dc.  Notified [facility] of dc home today.  Start of care for [facility] services 24-48h post dc date.    Reinaldo Raddle, RN, BSN  Trauma/Neuro ICU Case Manager [phone]

## 2017-09-26 NOTE — Progress Notes (Signed)
      [address]       [place] [address]             [phone]      7 Days Post-Op Procedure(s) (LRB): LEFT VIDEO ASSISTED THORACOSCOPY (VATS)/ LEFT UPPER LOBECTOMY, LYMPH NODE DISECTION (Left) Subjective: Feels ok, drainage around tube has diminished  Objective: Vital signs in last 24 hours: Temp:  [97.4 F (36.3 C)-98.8 F (37.1 C)] 98.8 F (37.1 C) (12/26 0412) Pulse Rate:  [67-97] 75 (12/26 0412) Cardiac Rhythm: Normal sinus rhythm;Bundle branch block (12/25 2036) Resp:  [16-23] 18 (12/26 0412) BP: (94-123)/(61-81) 123/61 (12/26 0412) SpO2:  [92 %-98 %] 92 % (12/26 0412)  Hemodynamic parameters for last 24 hours:    Intake/Output from previous day: 12/25 0701 - 12/26 0700 In: 600 [P.O.:600] Out: 70 [Chest Tube:70] Intake/Output this shift: No intake/output data recorded.  General appearance: alert, cooperative and no distress Heart: regular rate and rhythm Lungs: dim in left base Abdomen: mild distension, + BS, non tender Extremities: minor edema Wound: incis healing well  Lab Results: No results for input(s): WBC, HGB, HCT, PLT in the last 72 hours. BMET: No results for input(s): NA, K, CL, CO2, GLUCOSE, BUN, CREATININE, CALCIUM in the last 72 hours.  PT/INR: No results for input(s): LABPROT, INR in the last 72 hours. ABG    Component Value Date/Time   PHART 7.348 (L) 09/20/2017 0515   HCO3 21.8 09/20/2017 0515   ACIDBASEDEF 3.0 (H) 09/20/2017 0515   O2SAT 98.2 09/20/2017 0515   CBG (last 3)  No results for input(s): GLUCAP in the last 72 hours.  Meds Scheduled Meds: . amLODipine  10 mg Oral Daily  . aspirin EC  81 mg Oral Daily  . bisacodyl  10 mg Oral Daily  . enoxaparin (LOVENOX) injection  40 mg Subcutaneous Q24H  . Influenza vac split quadrivalent PF  0.5 mL Intramuscular Once  . mouth rinse  15 mL Mouth Rinse BID  . pantoprazole  40 mg Oral QHS  . pneumococcal 23 valent vaccine  0.5 mL Intramuscular Once  .  senna-docusate  1 tablet Oral QHS  . umeclidinium-vilanterol  1 puff Inhalation Daily   Continuous Infusions: . sodium chloride Stopped (09/22/17 1422)  . potassium chloride     PRN Meds:.ALPRAZolam, calcium carbonate, diphenhydrAMINE **OR** diphenhydrAMINE, hydrALAZINE, labetalol, naloxone **AND** sodium chloride flush, ondansetron (ZOFRAN) IV, oxyCODONE, oxyCODONE, potassium chloride  Xrays Dg Chest 2 View  Result Date: 09/25/2017 CLINICAL DATA:  Left chest tube EXAM: CHEST  2 VIEW COMPARISON:  09/23/2017 FINDINGS: Left chest tube remains in place. Moderate-sized left pneumothorax and left base pneumothorax, stable. Extensive subcutaneous emphysema throughout the left chest wall, increased. Bibasilar atelectasis, left greater than right. IMPRESSION: Left chest tube remains in place with moderate-sized left pneumothorax, stable. Increasing subcutaneous emphysema throughout the left chest wall. Bibasilar atelectasis. Electronically Signed   By: Rolm Baptise M.D.   On: 09/25/2017 07:30    Assessment/Plan: S/P Procedure(s) (LRB): LEFT VIDEO ASSISTED THORACOSCOPY (VATS)/ LEFT UPPER LOBECTOMY, LYMPH NODE DISECTION (Left) Plan for discharge: see discharge orders Reduce norvasc to home dose with some lower BP readings   LOS: 7 days    Nathaniel Long 09/26/2017

## 2017-09-26 NOTE — Progress Notes (Signed)
Put in order for home health per [place]; called wife and she will be in soon to learn about caring for the mini-express; will complete discharge process after teaching pt's wife how to empty mini-express. Will continue to monitor.   [name]  Adele Milson, RN

## 2017-09-26 NOTE — Plan of Care (Signed)
Pt progressing well; neuro WDL; chest tube and mini-express WDL; completed teaching with pt's wife about mini express; set up home health with nurse to assist; pt sitting in chair relaxing; finishing his lunch.   [name]  Nathaniel Kraynak, RN

## 2017-09-26 NOTE — Progress Notes (Signed)
Pt's wife educated about how to care for the mini-express; went over d/c instructions; no IV's to remove; dressing changed; pt sent with items to care for mini-express & surgical incision until home health meets with them; told to collect belongings; wheeled out by nurse.   Nathaniel Long  Hanah Moultry, RN

## 2017-09-26 NOTE — Discharge Instructions (Signed)
Lung Cancer Lung cancer occurs when abnormal cells in the lung grow out of control and form a mass (tumor). There are several types of lung cancer. The two most common types are:  Non-small cell. In this type of lung cancer, abnormal cells are larger and grow more slowly than those of small cell lung cancer.  Small cell. In this type of lung cancer, abnormal cells are smaller than those of non-small cell lung cancer. Small cell lung cancer gets worse faster than non-small cell lung cancer.  What are the causes? The leading cause of lung cancer is smoking tobacco. The second leading cause is radon exposure. What increases the risk?  Smoking tobacco.  Exposure to secondhand tobacco smoke.  Exposure to radon gas.  Exposure to asbestos.  Exposure to arsenic in drinking water.  Air pollution.  Family or personal history of lung cancer.  Lung radiation therapy.  Being older than 4 years. What are the signs or symptoms? In the early stages, symptoms may not be present. As the cancer progresses, symptoms may include:  A lasting cough, possibly with blood.  Fatigue.  Unexplained weight loss.  Shortness of breath.  Wheezing.  Chest pain.  Loss of appetite.  Symptoms of advanced lung cancer include:  Hoarseness.  Bone or joint pain.  Weakness.  Nail problems.  Face or arm swelling.  Paralysis of the face.  Drooping eyelids.  How is this diagnosed? Lung cancer can be identified with a physical exam and with tests such as:  A chest X-ray.  A CT scan.  Blood tests.  A biopsy.  After a diagnosis is made, you will have more tests to determine the stage of the cancer. The stages of non-small cell lung cancer are:  Stage 0, also called carcinoma in situ. At this stage, abnormal cells are found in the inner lining of your lung or lungs.  Stage I. At this stage, abnormal cells have grown into a tumor that is no larger than 5 cm across. The cancer has entered  the deeper lung tissue but has not yet entered the lymph nodes or other parts of the body.  Stage II. At this stage, the tumor is 7 cm across or smaller and has entered nearby lymph nodes. Or, the tumor is 5 cm across or smaller and has invaded surrounding tissue but is not found in nearby lymph nodes. There may be more than one tumor present.  Stage III. At this stage, the tumor may be any size. There may be more than one tumor in the lungs. The cancer cells have spread to the lymph nodes and possibly to other organs.  Stage IV. At this stage, there are tumors in both lungs and the cancer has spread to other areas of the body.  The stages of small cell lung cancer are:  Limited. At this stage, the cancer is found only on one side of the chest.  Extensive. At this stage, the cancer is in the lungs and in tissues on the other side of the chest. The cancer has spread to other organs or is found in the fluid between the layers of your lungs.  How is this treated? Depending on the type and stage of your lung cancer, you may be treated with:  Surgery. This is done to remove a tumor.  Radiation therapy. This treatment destroys cancer cells using X-rays or other types of radiation.  Chemotherapy. This treatment uses medicines to destroy cancer cells.  Targeted therapy. This treatment  aims to destroy only cancer cells instead of all cells as other therapies do.  You may also have a combination of treatments. Follow these instructions at home:  Do not use any tobacco products. This includes cigarettes, chewing tobacco, and electronic cigarettes. If you need help quitting, ask your health care provider.  Take medicines only as directed by your health care provider.  Eat a healthy diet. Work with a dietitian to make sure you are getting the nutrition you need.  Consider joining a support group or seeking counseling to help you cope with the stress of having lung cancer.  Let your cancer  specialist (oncologist) know if you are admitted to the hospital.  Keep all follow-up visits as directed by your health care provider. This is important. Contact a health care provider if:  You lose weight without trying.  You have a persistent cough and wheezing.  You feel short of breath.  You tire easily.  You experience bone or joint pain.  You have difficulty swallowing.  You feel hoarse or notice your voice changing.  Your pain medicine is not helping. Get help right away if:  You cough up blood.  You have new breathing problems.  You develop chest pain.  You develop swelling in: ? One or both ankles or legs. ? Your face, neck, or arms.  You are confused.  You experience paralysis in your face or a drooping eyelid. This information is not intended to replace advice given to you by your health care provider. Make sure you discuss any questions you have with your health care provider. Document Released: 12/25/2000 Document Revised: 02/24/2016 Document Reviewed: 01/22/2014 Elsevier Interactive Patient Education  2018 [employer]. Thoracoscopy, Care After Refer to this sheet in the next few weeks. These instructions provide you with information about caring for yourself after your procedure. Your health care provider may also give you more specific instructions. Your treatment has been planned according to current medical practices, but problems sometimes occur. Call your health care provider if you have any problems or questions after your procedure. What can I expect after the procedure? After your procedure, it is common to feel sore for up to two weeks. Follow these instructions at home:  There are many different ways to close and cover an incision, including stitches (sutures), skin glue, and adhesive strips. Follow your health care provider's instructions about: ? Incision care. ? Bandage (dressing) changes and removal. ? Incision closure removal.  Check  your incision area every day for signs of infection. Watch for: ? Redness, swelling, or pain. ? Fluid, blood, or pus.  Take medicines only as directed by your health care provider.  Try to cough often. Coughing helps to protect against lung infection (pneumonia). It may hurt to cough. If this happens, hold a pillow against your chest when you cough.  Take deep breaths. This also helps to protect against pneumonia.  If you were given an incentive spirometer, use it as directed by your health care provider.  Do not take baths, swim, or use a hot tub until your health care provider approves. You may take showers.  Avoid lifting until your health care provider approves.  Avoid driving until your health care provider approves.  Do not travel by airplane after the chest tube is removed until your health care provider approves. Contact a health care provider if:  You have a fever.  Pain medicines do not ease your pain.  You have redness, swelling, or increasing pain in  your incision area.  You develop a cough that does not go away, or you are coughing up mucus that is yellow or green. Get help right away if:  You have fluid, blood, or pus coming from your incision.  There is a bad smell coming from your incision or dressing.  You develop a rash.  You have difficulty breathing.  You cough up blood.  You develop light-headedness or you feel faint.  You develop chest pain.  Your heartbeat feels irregular or very fast. This information is not intended to replace advice given to you by your health care provider. Make sure you discuss any questions you have with your health care provider. Document Released: 04/07/2005 Document Revised: 05/21/2016 Document Reviewed: 06/03/2014 Elsevier Interactive Patient Education  2018 [employer].

## 2017-09-27 ENCOUNTER — Telehealth: Payer: Self-pay | Admitting: Pulmonary Disease

## 2017-09-27 DIAGNOSIS — C3412 Malignant neoplasm of upper lobe, left bronchus or lung: Secondary | ICD-10-CM

## 2017-09-27 MED ORDER — ALBUTEROL SULFATE (2.5 MG/3ML) 0.083% IN NEBU: 2.5000 mg | INHALATION_SOLUTION | Freq: Four times a day (QID) | RESPIRATORY_TRACT | 12 refills | Status: DC | PRN

## 2017-09-27 NOTE — Telephone Encounter (Signed)
Ok to order compressor nebulizer  And albuterol 0.083% neb solution, # 150 ml  Ref prn through DME                Sig    1 neb every 6 hours as needed

## 2017-09-27 NOTE — Telephone Encounter (Signed)
Pt's spouse, Horris Latino is aware of below message and voiced her understanding.  Rx for albuterol neb solution has been printed and given to PCC's. Order for neb machine has been placed to [facility] per pt's request.  Nothing further is needed.

## 2017-09-27 NOTE — Telephone Encounter (Signed)
Called and spoke with pt's spouse, Horris Latino. Horris Latino states that pt had thoracoscopy on 09/19/17. Horris Latino states pt still has draining tube in. Home health nurse comes by to drain tube.  Pt reports of wheezing & prod cough (unsure of color)  Horris Latino is requesting Rx for albuterol neb solution and machine.   CY please advise, as PM is unavailable. Thanks.  Current Outpatient Medications on File Prior to Visit  Medication Sig Dispense Refill  . acetaminophen (TYLENOL) 325 MG tablet Take 1 tablet (325 mg total) by mouth every 6 (six) hours as needed for moderate pain or headache.    . ALPRAZolam (XANAX) 0.5 MG tablet Take 1 tablet (0.5 mg total) by mouth at bedtime as needed for anxiety.  0  . amLODipine (NORVASC) 5 MG tablet Take 5 mg daily by mouth.  0  . aspirin EC 81 MG tablet Take 1 tablet (81 mg total) by mouth daily. 30 tablet 2  . calcium carbonate (TUMS - DOSED IN MG ELEMENTAL CALCIUM) 500 MG chewable tablet Chew 1 tablet by mouth daily as needed for indigestion or heartburn.    . cetirizine (ZYRTEC) 10 MG tablet TAKE 1 TABLET BY MOUTH AT BEDTIME (Patient taking differently: Take 10 mg daily as needed for allergies) 31 tablet 3  . Cholecalciferol (VITAMIN D) 2000 units CAPS Take 4,000 Units by mouth every evening.    . clotrimazole (LOTRIMIN) 1 % cream Apply 1 application topically 2 (two) times daily.    . fish oil-omega-3 fatty acids 1000 MG capsule Take 1 g by mouth every evening.     . Multiple Vitamins-Minerals (CENTRUM PO) Take 1 tablet by mouth every evening.     Marland Kitchen oxyCODONE (OXY IR/ROXICODONE) 5 MG immediate release tablet Take 1 tablet (5 mg total) by mouth every 6 (six) hours as needed for moderate pain. 30 tablet 0  . pantoprazole (PROTONIX) 40 MG tablet Take 40 mg by mouth at bedtime.     . Pitavastatin Calcium (LIVALO) 4 MG TABS Take 2 mg by mouth every evening.     . sodium chloride (OCEAN) 0.65 % SOLN nasal spray Place 1 spray into both nostrils as needed for congestion.    Marland Kitchen  umeclidinium-vilanterol (ANORO ELLIPTA) 62.5-25 MCG/INH AEPB Inhale 1 puff daily into the lungs. 60 each 3   No current facility-administered medications on file prior to visit.     Allergies  Allergen Reactions  . Gabapentin Other (See Comments)    dizziness  . Statins Other (See Comments)    Muscle weakness

## 2017-09-28 ENCOUNTER — Other Ambulatory Visit: Payer: Self-pay

## 2017-09-28 ENCOUNTER — Other Ambulatory Visit: Payer: Self-pay | Admitting: Thoracic Surgery (Cardiothoracic Vascular Surgery)

## 2017-09-28 ENCOUNTER — Ambulatory Visit
Admission: RE | Admit: 2017-09-28 | Discharge: 2017-09-28 | Disposition: A | Discharge status: Home / Self Care | Payer: Medicare Other | Source: Ambulatory Visit | Attending: Surgical | Admitting: Surgical

## 2017-09-28 ENCOUNTER — Inpatient Hospital Stay (HOSPITAL_COMMUNITY)
Admission: AD | Admit: 2017-09-28 | Discharge: 2017-10-06 | DRG: 186 | Disposition: A | Discharge status: Home Health | Payer: Medicare Other | Source: Ambulatory Visit | Attending: Thoracic Surgery (Cardiothoracic Vascular Surgery) | Admitting: Thoracic Surgery (Cardiothoracic Vascular Surgery)

## 2017-09-28 ENCOUNTER — Telehealth: Payer: Self-pay | Admitting: Pulmonary Disease

## 2017-09-28 ENCOUNTER — Inpatient Hospital Stay (HOSPITAL_COMMUNITY): Payer: Medicare Other

## 2017-09-28 ENCOUNTER — Ambulatory Visit (INDEPENDENT_AMBULATORY_CARE_PROVIDER_SITE_OTHER): Payer: Self-pay | Admitting: Surgical

## 2017-09-28 VITALS — BP 94/64 | HR 94 | Ht 70.5 in | Wt 192.0 lb

## 2017-09-28 DIAGNOSIS — Z902 Acquired absence of lung [part of]: Secondary | ICD-10-CM

## 2017-09-28 DIAGNOSIS — Z87891 Personal history of nicotine dependence: Secondary | ICD-10-CM

## 2017-09-28 DIAGNOSIS — N189 Chronic kidney disease, unspecified: Secondary | ICD-10-CM | POA: Diagnosis present

## 2017-09-28 DIAGNOSIS — I2510 Atherosclerotic heart disease of native coronary artery without angina pectoris: Secondary | ICD-10-CM | POA: Diagnosis present

## 2017-09-28 DIAGNOSIS — I129 Hypertensive chronic kidney disease with stage 1 through stage 4 chronic kidney disease, or unspecified chronic kidney disease: Secondary | ICD-10-CM | POA: Diagnosis present

## 2017-09-28 DIAGNOSIS — Z7982 Long term (current) use of aspirin: Secondary | ICD-10-CM

## 2017-09-28 DIAGNOSIS — D62 Acute posthemorrhagic anemia: Secondary | ICD-10-CM | POA: Diagnosis not present

## 2017-09-28 DIAGNOSIS — J948 Other specified pleural conditions: Secondary | ICD-10-CM | POA: Diagnosis present

## 2017-09-28 DIAGNOSIS — Z79899 Other long term (current) drug therapy: Secondary | ICD-10-CM

## 2017-09-28 DIAGNOSIS — M5440 Lumbago with sciatica, unspecified side: Secondary | ICD-10-CM | POA: Diagnosis present

## 2017-09-28 DIAGNOSIS — Z981 Arthrodesis status: Secondary | ICD-10-CM | POA: Diagnosis not present

## 2017-09-28 DIAGNOSIS — E876 Hypokalemia: Secondary | ICD-10-CM | POA: Diagnosis not present

## 2017-09-28 DIAGNOSIS — G8929 Other chronic pain: Secondary | ICD-10-CM | POA: Diagnosis present

## 2017-09-28 DIAGNOSIS — C3412 Malignant neoplasm of upper lobe, left bronchus or lung: Secondary | ICD-10-CM | POA: Diagnosis present

## 2017-09-28 DIAGNOSIS — J15212 Pneumonia due to Methicillin resistant Staphylococcus aureus: Secondary | ICD-10-CM | POA: Diagnosis present

## 2017-09-28 DIAGNOSIS — F419 Anxiety disorder, unspecified: Secondary | ICD-10-CM | POA: Diagnosis present

## 2017-09-28 DIAGNOSIS — H9190 Unspecified hearing loss, unspecified ear: Secondary | ICD-10-CM | POA: Diagnosis present

## 2017-09-28 DIAGNOSIS — Z9689 Presence of other specified functional implants: Secondary | ICD-10-CM

## 2017-09-28 DIAGNOSIS — K219 Gastro-esophageal reflux disease without esophagitis: Secondary | ICD-10-CM | POA: Diagnosis present

## 2017-09-28 DIAGNOSIS — Z888 Allergy status to other drugs, medicaments and biological substances status: Secondary | ICD-10-CM | POA: Diagnosis not present

## 2017-09-28 DIAGNOSIS — Z22322 Carrier or suspected carrier of Methicillin resistant Staphylococcus aureus: Secondary | ICD-10-CM

## 2017-09-28 LAB — CBC WITH DIFFERENTIAL/PLATELET
BASOS PCT: 0 %
Basophils Absolute: 0 10*3/uL (ref 0.0–0.1)
Eosinophils Absolute: 0.1 10*3/uL (ref 0.0–0.7)
Eosinophils Relative: 0 %
HEMATOCRIT: 29.2 % — AB (ref 39.0–52.0)
HEMOGLOBIN: 9.7 g/dL — AB (ref 13.0–17.0)
LYMPHS ABS: 1.1 10*3/uL (ref 0.7–4.0)
Lymphocytes Relative: 5 %
MCH: 30.5 pg (ref 26.0–34.0)
MCHC: 33.2 g/dL (ref 30.0–36.0)
MCV: 91.8 fL (ref 78.0–100.0)
MONOS PCT: 7 %
Monocytes Absolute: 1.6 10*3/uL — ABNORMAL HIGH (ref 0.1–1.0)
NEUTROS ABS: 19.4 10*3/uL — AB (ref 1.7–7.7)
NEUTROS PCT: 88 %
Platelets: 466 10*3/uL — ABNORMAL HIGH (ref 150–400)
RBC: 3.18 MIL/uL — ABNORMAL LOW (ref 4.22–5.81)
RDW: 14.9 % (ref 11.5–15.5)
WBC: 22.2 10*3/uL — ABNORMAL HIGH (ref 4.0–10.5)

## 2017-09-28 LAB — URINALYSIS, ROUTINE W REFLEX MICROSCOPIC
Bacteria, UA: NONE SEEN
Bilirubin Urine: NEGATIVE
GLUCOSE, UA: NEGATIVE mg/dL
HGB URINE DIPSTICK: NEGATIVE
Ketones, ur: NEGATIVE mg/dL
Leukocytes, UA: NEGATIVE
NITRITE: NEGATIVE
PH: 7 (ref 5.0–8.0)
Protein, ur: 100 mg/dL — AB
SPECIFIC GRAVITY, URINE: 1.02 (ref 1.005–1.030)

## 2017-09-28 LAB — COMPREHENSIVE METABOLIC PANEL
ALT: 19 U/L (ref 17–63)
ANION GAP: 13 (ref 5–15)
AST: 22 U/L (ref 15–41)
Albumin: 2.5 g/dL — ABNORMAL LOW (ref 3.5–5.0)
Alkaline Phosphatase: 109 U/L (ref 38–126)
BUN: 22 mg/dL — ABNORMAL HIGH (ref 6–20)
CALCIUM: 8.1 mg/dL — AB (ref 8.9–10.3)
CHLORIDE: 94 mmol/L — AB (ref 101–111)
CO2: 24 mmol/L (ref 22–32)
Creatinine, Ser: 1.59 mg/dL — ABNORMAL HIGH (ref 0.61–1.24)
GFR calc non Af Amer: 41 mL/min — ABNORMAL LOW (ref >60)
GFR, EST AFRICAN AMERICAN: 47 mL/min — AB (ref >60)
Glucose, Bld: 128 mg/dL — ABNORMAL HIGH (ref 65–99)
Potassium: 3.8 mmol/L (ref 3.5–5.1)
SODIUM: 131 mmol/L — AB (ref 135–145)
Total Bilirubin: 0.8 mg/dL (ref 0.3–1.2)
Total Protein: 6.2 g/dL — ABNORMAL LOW (ref 6.5–8.1)

## 2017-09-28 MED ORDER — ACETAMINOPHEN 325 MG PO TABS
650.0000 mg | ORAL_TABLET | Freq: Four times a day (QID) | ORAL | Status: DC | PRN
  Administered 2017-09-29: 650 mg via ORAL
  Filled 2017-09-28: qty 2

## 2017-09-28 MED ORDER — SODIUM CHLORIDE 0.9% FLUSH: 3.0000 mL | Freq: Two times a day (BID) | INTRAVENOUS | Status: AC

## 2017-09-28 MED ORDER — ONDANSETRON HCL 4 MG/2ML IJ SOLN: 4.0000 mg | Freq: Four times a day (QID) | INTRAMUSCULAR | Status: DC | PRN

## 2017-09-28 MED ORDER — SODIUM CHLORIDE 0.9% FLUSH
3.0000 mL | Freq: Two times a day (BID) | INTRAVENOUS | Status: DC
  Administered 2017-09-29 – 2017-10-06 (×7): 3 mL via INTRAVENOUS

## 2017-09-28 MED ORDER — LEVALBUTEROL HCL 0.63 MG/3ML IN NEBU
0.6300 mg | INHALATION_SOLUTION | Freq: Three times a day (TID) | RESPIRATORY_TRACT | Status: DC
  Administered 2017-09-29 – 2017-10-04 (×16): 0.63 mg via RESPIRATORY_TRACT
  Filled 2017-09-28 (×17): qty 3

## 2017-09-28 MED ORDER — POLYETHYLENE GLYCOL 3350 17 G PO PACK: 17.0000 g | PACK | Freq: Every day | ORAL | Status: DC | PRN

## 2017-09-28 MED ORDER — ONDANSETRON HCL 4 MG PO TABS: 4.0000 mg | ORAL_TABLET | Freq: Four times a day (QID) | ORAL | Status: DC | PRN

## 2017-09-28 MED ORDER — SODIUM CHLORIDE 0.9% FLUSH: 3.0000 mL | INTRAVENOUS | Status: AC | PRN

## 2017-09-28 MED ORDER — ACETAMINOPHEN 650 MG RE SUPP: 650.0000 mg | Freq: Four times a day (QID) | RECTAL | Status: DC | PRN

## 2017-09-28 MED ORDER — ALPRAZOLAM 0.5 MG PO TABS
0.5000 mg | ORAL_TABLET | Freq: Every evening | ORAL | Status: DC | PRN
  Administered 2017-10-02: 0.5 mg via ORAL
  Filled 2017-09-28: qty 1

## 2017-09-28 MED ORDER — PIPERACILLIN-TAZOBACTAM 3.375 G IVPB
3.3750 g | Freq: Three times a day (TID) | INTRAVENOUS | Status: DC
  Administered 2017-09-29 – 2017-10-05 (×19): 3.375 g via INTRAVENOUS
  Filled 2017-09-28 (×21): qty 50

## 2017-09-28 MED ORDER — MAGNESIUM HYDROXIDE 400 MG/5ML PO SUSP: 30.0000 mL | Freq: Every day | ORAL | Status: DC | PRN

## 2017-09-28 MED ORDER — SODIUM CHLORIDE 0.9% FLUSH: 3.0000 mL | INTRAVENOUS | Status: DC | PRN

## 2017-09-28 MED ORDER — DOCUSATE SODIUM 100 MG PO CAPS: 100.0000 mg | ORAL_CAPSULE | Freq: Two times a day (BID) | ORAL | Status: DC

## 2017-09-28 MED ORDER — ENOXAPARIN SODIUM 30 MG/0.3ML ~~LOC~~ SOLN: 30.0000 mg | SUBCUTANEOUS | Status: DC

## 2017-09-28 MED ORDER — LORATADINE 10 MG PO TABS
10.0000 mg | ORAL_TABLET | Freq: Every day | ORAL | Status: DC
  Administered 2017-09-28 – 2017-10-06 (×9): 10 mg via ORAL
  Filled 2017-09-28 (×9): qty 1

## 2017-09-28 MED ORDER — CALCIUM CARBONATE ANTACID 500 MG PO CHEW: 1.0000 | CHEWABLE_TABLET | Freq: Every day | ORAL | Status: DC | PRN

## 2017-09-28 MED ORDER — BISACODYL 5 MG PO TBEC
5.0000 mg | DELAYED_RELEASE_TABLET | Freq: Every day | ORAL | Status: DC | PRN
Start: 2017-09-28 — End: 2017-09-28

## 2017-09-28 MED ORDER — UMECLIDINIUM-VILANTEROL 62.5-25 MCG/INH IN AEPB
1.0000 | INHALATION_SPRAY | Freq: Every day | RESPIRATORY_TRACT | Status: DC
  Administered 2017-09-30 – 2017-10-06 (×7): 1 via RESPIRATORY_TRACT
  Filled 2017-09-28: qty 14

## 2017-09-28 MED ORDER — ALBUTEROL SULFATE (2.5 MG/3ML) 0.083% IN NEBU: 2.5000 mg | INHALATION_SOLUTION | Freq: Four times a day (QID) | RESPIRATORY_TRACT | 11 refills | Status: DC | PRN

## 2017-09-28 MED ORDER — OXYCODONE HCL 5 MG PO TABS: 5.0000 mg | ORAL_TABLET | ORAL | Status: DC | PRN

## 2017-09-28 MED ORDER — BISACODYL 5 MG PO TBEC
5.0000 mg | DELAYED_RELEASE_TABLET | Freq: Every day | ORAL | Status: DC | PRN
  Administered 2017-10-04: 5 mg via ORAL
  Filled 2017-09-28: qty 1

## 2017-09-28 MED ORDER — SODIUM CHLORIDE 0.9 % IV SOLN
250.0000 mL | INTRAVENOUS | Status: AC | PRN
Start: 2017-09-28 — End: ?

## 2017-09-28 MED ORDER — ENOXAPARIN SODIUM 30 MG/0.3ML ~~LOC~~ SOLN
30.0000 mg | SUBCUTANEOUS | Status: DC
Start: 2017-09-28 — End: 2017-09-28

## 2017-09-28 MED ORDER — PIPERACILLIN-TAZOBACTAM 3.375 G IVPB 30 MIN
3.3750 g | Freq: Once | INTRAVENOUS | Status: AC
  Administered 2017-09-28: 3.375 g via INTRAVENOUS
  Filled 2017-09-28: qty 50

## 2017-09-28 MED ORDER — LEVALBUTEROL HCL 0.63 MG/3ML IN NEBU: 0.6300 mg | INHALATION_SOLUTION | Freq: Three times a day (TID) | RESPIRATORY_TRACT | Status: DC

## 2017-09-28 MED ORDER — ONDANSETRON HCL 4 MG/2ML IJ SOLN
4.0000 mg | Freq: Four times a day (QID) | INTRAMUSCULAR | Status: DC | PRN
Start: 2017-09-28 — End: 2017-09-28

## 2017-09-28 MED ORDER — ORAL CARE MOUTH RINSE
15.0000 mL | Freq: Two times a day (BID) | OROMUCOSAL | Status: DC
  Administered 2017-09-29 – 2017-10-03 (×5): 15 mL via OROMUCOSAL

## 2017-09-28 MED ORDER — FLEET ENEMA 7-19 GM/118ML RE ENEM: 1.0000 | ENEMA | Freq: Once | RECTAL | Status: DC | PRN

## 2017-09-28 MED ORDER — ASPIRIN EC 81 MG PO TBEC
81.0000 mg | DELAYED_RELEASE_TABLET | Freq: Every day | ORAL | Status: DC
  Administered 2017-09-28 – 2017-10-06 (×9): 81 mg via ORAL
  Filled 2017-09-28 (×9): qty 1

## 2017-09-28 MED ORDER — DOCUSATE SODIUM 100 MG PO CAPS
100.0000 mg | ORAL_CAPSULE | Freq: Two times a day (BID) | ORAL | Status: DC
  Administered 2017-09-29 – 2017-10-06 (×14): 100 mg via ORAL
  Filled 2017-09-28 (×15): qty 1

## 2017-09-28 MED ORDER — TRAMADOL HCL 50 MG PO TABS
50.0000 mg | ORAL_TABLET | Freq: Four times a day (QID) | ORAL | Status: DC | PRN
  Administered 2017-09-29 – 2017-10-04 (×5): 50 mg via ORAL
  Filled 2017-09-28 (×7): qty 1

## 2017-09-28 MED ORDER — SALINE SPRAY 0.65 % NA SOLN
1.0000 | NASAL | Status: DC | PRN
  Filled 2017-09-28: qty 44

## 2017-09-28 MED ORDER — SODIUM CHLORIDE 0.9% FLUSH
3.0000 mL | Freq: Two times a day (BID) | INTRAVENOUS | Status: DC
  Administered 2017-09-29 – 2017-10-06 (×11): 3 mL via INTRAVENOUS

## 2017-09-28 MED ORDER — SODIUM CHLORIDE 0.9 % IV SOLN
1750.0000 mg | Freq: Once | INTRAVENOUS | Status: AC
  Administered 2017-09-28: 1750 mg via INTRAVENOUS
  Filled 2017-09-28: qty 1750

## 2017-09-28 MED ORDER — VANCOMYCIN HCL IN DEXTROSE 750-5 MG/150ML-% IV SOLN
750.0000 mg | Freq: Two times a day (BID) | INTRAVENOUS | Status: DC
  Administered 2017-09-29 – 2017-10-04 (×11): 750 mg via INTRAVENOUS
  Filled 2017-09-28 (×13): qty 150

## 2017-09-28 MED ORDER — ACETAMINOPHEN 325 MG PO TABS: 650.0000 mg | ORAL_TABLET | Freq: Four times a day (QID) | ORAL | Status: DC | PRN

## 2017-09-28 MED ORDER — SODIUM CHLORIDE 0.9 % IV SOLN: 250.0000 mL | INTRAVENOUS | Status: DC | PRN

## 2017-09-28 MED ORDER — PANTOPRAZOLE SODIUM 40 MG PO TBEC
40.0000 mg | DELAYED_RELEASE_TABLET | Freq: Every day | ORAL | Status: DC
  Administered 2017-09-28 – 2017-10-05 (×8): 40 mg via ORAL
  Filled 2017-09-28 (×8): qty 1

## 2017-09-28 MED ORDER — AMLODIPINE BESYLATE 5 MG PO TABS
5.0000 mg | ORAL_TABLET | Freq: Every day | ORAL | Status: DC
  Administered 2017-09-28 – 2017-10-06 (×8): 5 mg via ORAL
  Filled 2017-09-28 (×9): qty 1

## 2017-09-28 NOTE — H&P (Signed)
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  At the time of discharge the patient had a small air leak and was sent home on a mini expressed chest tube system.  He was seen in the office on today's date where the tube was found to be occluded and there is some associated erythema surrounding the tube.  In the tube there did appear to be some purulent drainage.  The patient has some shortness of breath with activities including occasional wheezing.  He fatigues fairly easily.  He is uncertain if he has had a fever because he did not measure his temperature but his wife felt as though he possibly did last night.  He denies chills or sweats.  The incision itself is healing well without evidence of infection.  A chest x-ray was obtained and this reveals increased pleural fluid.  The pneumothorax appears stable as does the subcutaneous air.  It is felt he would benefit from readmission with change of chest tube to Pleur-evac system with suction.  Patient Active Problem List   Diagnosis Date Noted  . S/P lobectomy of lung 09/19/2017  . Malignant neoplasm of upper lobe of left lung ([place]) 09/13/2017  . Pulmonary nodule, left 09/05/2017  . Mediastinal lymphadenopathy 09/05/2017  . Multiple rib fractures 07/16/2017  . Spondylolisthesis, lumbar region 06/18/2017  . Right hip pain 05/23/2017  . Chronic bilateral low back pain with bilateral sciatica 05/23/2017  . Colon cancer screening 01/12/2017  . Antiplatelet or antithrombotic long-term use 01/12/2017  . PAD (peripheral artery disease) ([place]) 04/13/2011  . Murmur, heart 12/08/2010  . ACTINIC KERATOSIS, HEAD 07/14/2010  . BRUISED ANKLE 07/14/2010  . HYPERTENSION, BENIGN 11/19/2009  . TOBACCO ABUSE 10/28/2009  . EAR PAIN, LEFT 10/28/2009   Past Medical History:  Diagnosis Date  . Anxiety   . Carotid artery stenosis   . Chronic lower back pain   . CKD (chronic  kidney disease)   . Complication of anesthesia    "can't pee when I come out of OR" (07/18/2017)  . Contact with chainsaw as cause of accidental injury 07/16/2017   sternal fracture with bilateral anterior fifth and sixth rib fractures /notes 07/16/2017  . COPD (chronic obstructive pulmonary disease) ([place])   . Dyspnea   . GERD (gastroesophageal reflux disease)   . Hard of hearing   . High cholesterol   . Hypertension   . Thoracic ascending aortic aneurysm [facility])     Past Surgical History:  Procedure Laterality Date  . ABDOMINAL AORTOGRAM W/LOWER EXTREMITY N/A 05/01/2017   Procedure: Abdominal Aortogram w/Lower Extremity;  Surgeon: Adrian Prows, MD;  Location: [place] [facility];  Service: Cardiovascular;  Laterality: N/A;  . BACK SURGERY    . FINGER REPLANTATION Left    "ring finger"  . HEMORRHOID SURGERY    . POSTERIOR LUMBAR FUSION  1986  . VIDEO ASSISTED THORACOSCOPY (VATS)/ LOBECTOMY Left 09/19/2017   Procedure: LEFT VIDEO ASSISTED THORACOSCOPY (VATS)/ LEFT UPPER LOBECTOMY, LYMPH NODE DISECTION;  Surgeon: Melrose Nakayama, MD;  Location: [place];  Service: Thoracic;  Laterality: Left;  Marland Kitchen VIDEO BRONCHOSCOPY WITH ENDOBRONCHIAL NAVIGATION N/A 09/05/2017   Procedure: VIDEO BRONCHOSCOPY WITH ENDOBRONCHIAL NAVIGATION;  Surgeon: Collene Gobble, MD;  Location: [place];  Service: Thoracic;  Laterality: N/A;  . VIDEO BRONCHOSCOPY WITH ENDOBRONCHIAL ULTRASOUND N/A 09/05/2017   Procedure: VIDEO BRONCHOSCOPY WITH ENDOBRONCHIAL ULTRASOUND;  Surgeon: Collene Gobble, MD;  Location: [place];  Service: Thoracic;  Laterality: N/A;    Medications Prior to Admission  Medication Sig Dispense Refill Last Dose  . acetaminophen (TYLENOL) 325 MG tablet Take 1 tablet (325 mg total) by mouth every 6 (six) hours as needed for moderate pain or headache.   Taking  . albuterol (PROVENTIL) (2.5 MG/3ML) 0.083% nebulizer solution Take 3 mLs (2.5 mg total) by nebulization every 6 (six) hours as needed for wheezing or  shortness of breath. Dx:C34.12 120 mL 11 Taking  . ALPRAZolam (XANAX) 0.5 MG tablet Take 1 tablet (0.5 mg total) by mouth at bedtime as needed for anxiety.  0 Taking  . amLODipine (NORVASC) 5 MG tablet Take 5 mg daily by mouth.  0 Taking  . aspirin EC 81 MG tablet Take 1 tablet (81 mg total) by mouth daily. 30 tablet 2 Taking  . calcium carbonate (TUMS - DOSED IN MG ELEMENTAL CALCIUM) 500 MG chewable tablet Chew 1 tablet by mouth daily as needed for indigestion or heartburn.   Taking  . cetirizine (ZYRTEC) 10 MG tablet TAKE 1 TABLET BY MOUTH AT BEDTIME (Patient taking differently: Take 10 mg daily as needed for allergies) 31 tablet 3 Taking  . Cholecalciferol (VITAMIN D) 2000 units CAPS Take 4,000 Units by mouth every evening.   Taking  . clotrimazole (LOTRIMIN) 1 % cream Apply 1 application topically 2 (two) times daily.   Taking  . fish oil-omega-3 fatty acids 1000 MG capsule Take 1 g by mouth every evening.    Taking  . Multiple Vitamins-Minerals (CENTRUM PO) Take 1 tablet by mouth every evening.    Taking  . oxyCODONE (OXY IR/ROXICODONE) 5 MG immediate release tablet Take 1 tablet (5 mg total) by mouth every 6 (six) hours as needed for moderate pain. 30 tablet 0 Taking  . pantoprazole (PROTONIX) 40 MG tablet Take 40 mg by mouth at bedtime.    Taking  . Pitavastatin Calcium (LIVALO) 4 MG TABS Take 2 mg by mouth every evening.    Taking  . sodium chloride (OCEAN) 0.65 % SOLN nasal spray Place 1 spray into both nostrils as needed for congestion.   Taking  . umeclidinium-vilanterol (ANORO ELLIPTA) 62.5-25 MCG/INH AEPB Inhale 1 puff daily into the lungs. 60 each 3 Taking   Allergies  Allergen Reactions  . Gabapentin Other (See Comments)    dizziness  . Statins Other (See Comments)    Muscle weakness    Social History   Tobacco Use  . Smoking status: Former Smoker    Packs/day: 1.00    Years: 33.00    Pack years: 33.00    Types: Cigarettes    Last attempt to quit: 04/17/2017    Years  since quitting: 0.4  . Smokeless tobacco: Never Used  . Tobacco comment: 07/18/2017 "stopped ~several months ago"  Substance Use Topics  . Alcohol use: Yes    Comment: last 95    Family History  Problem Relation Age of Onset  . Colon polyps Brother   . Dementia Sister     Review of Systems Pertinent items noted in HPI and remainder of comprehensive ROS otherwise negative.  Objective:   No data found. I/O last 3 completed shifts: In: 1320 [P.O.:1320] Out: 1020 [Urine:950; Chest Tube:70] No intake/output data recorded.  Physical Exam  Constitutional: No distress.  Neck: Neck supple. No JVD present. No neck adenopathy. No thyromegaly present.  Cardiovascular: Normal rate and normal heart sounds.  Pulmonary/Chest: He has no wheezes. He has no rales. Tenderness is present which mimics chest pain.  Diminished breath sounds in the left lower fields.  Abdominal:  benign exam  Musculoskeletal: He exhibits no edema.  Neurological: He is alert and oriented to person, place, and time.  Skin: Skin is warm and dry. No rash noted. No pallor.  Early cellulitis surrounding the chest tube.    No results found for this or any previous visit (from the past 72 hour(s)). Dg Chest 2 View  Result Date: 09/28/2017 CLINICAL DATA:  Left upper lobectomy, status post VATS on 09/19/2017 EXAM: CHEST  2 VIEW COMPARISON:  09/26/2017 FINDINGS: Small to moderate left hydropneumothorax with stable small left pneumothorax component an increasing pleural fluid component. Indwelling left chest tube. Extensive subcutaneous emphysema along the left lateral chest wall, stable versus mildly increased. Associated postsurgical changes in the left hemithorax with associated patchy left lower lobe opacity, likely atelectasis. Right lung is clear. The heart is normal in size. IMPRESSION: Small to moderate left hydropneumothorax with indwelling left chest tube. Pleural fluid component has mildly increased but the  pneumothorax is stable. Extensive subcutaneous emphysema along the left lateral chest wall, stable versus mildly increased. Electronically Signed   By: Julian Hy M.D.   On: 09/28/2017 16:09   Assessment:   Active Problems:   S/P lobectomy of lung Increased purulent drainage from chest tube with tube poorly functioning.  There is currently no evidence of an air leak.  Plan:   We will admit the patient for intravenous antibiotics vancomycin and Zosyn.  Additionally we will change tube to a Pleur-evac with suction at 20 cm of water.  We will also obtain a culture of the drainage from the tube.  We will also blood culture x2.  We will also obtain a CT scan of the chest to better define what is going on internally with the fluid to see if there is any evidence of early empyema.  See orders for additional information.

## 2017-09-28 NOTE — Telephone Encounter (Signed)
Called and spoke with pts wife and she is aware that the rx will be sent to the pharmacy. Looks like it was printed yesterday instead of faxed into the pharmacy.  Nothing further is needed.

## 2017-09-28 NOTE — H&P (Signed)
[address]       [place] [address]             [phone]      Nathaniel Long Medical Record #[id number] Date of Birth: March 09, 1941  Referring: No ref. provider found Primary Care: [name], MD Primary Cardiologist: No primary care provider on file.   Chief Complaint:   POST OP FOLLOW UP  OF PROCEDURE:  09/19/2017 DATE OF DISCHARGE:                              OPERATIVE REPORT   PREOPERATIVE DIAGNOSIS:  Adenocarcinoma, left upper lobe, clinical stage IA.  POSTOPERATIVE DIAGNOSIS:  Adenocarcinoma, left upper lobe, clinical stage IA.  PROCEDURES:  Left video-assisted thoracoscopy, thoracoscopic left upper lobectomy, mediastinal lymph node dissection, On-Q local anesthetic catheter placement.  SURGEON:  Revonda Standard. Roxan Hockey, MD.  ASSISTANT:  Jadene Pierini, PA-C.  ANESTHESIA:  General.   History of Present Illness:     The patient is seen in the office on today's date following the above procedure.  At time of discharge he went home with a mini express due to persistent air leak with small space/pneumothorax.  The patient noted that the tube does not appear to be functional currently blocked with proteinaceous material.  There has been no significant drainage since discharge.  Additionally, there is some mild erythema site.  Chest x-ray on today's date shows some increased effusion with a stable space.  He is asymptomatic at this point without significant shortness of breath. Patient had increasing wheezing today      Past Medical History:  Diagnosis Date  . Anxiety   . Carotid artery stenosis   . Chronic lower back pain   . CKD (chronic kidney disease)   . Complication of anesthesia    "can't pee when I come out of OR" (07/18/2017)  . Contact with chainsaw as cause of accidental injury 07/16/2017   sternal fracture with bilateral anterior fifth and sixth rib fractures /notes 07/16/2017  . COPD (chronic obstructive  pulmonary disease) ([place])   . Dyspnea   . GERD (gastroesophageal reflux disease)   . Hard of hearing   . High cholesterol   . Hypertension   . Thoracic ascending aortic aneurysm [facility])      Social History   Tobacco Use  Smoking Status Former Smoker  . Packs/day: 1.00  . Years: 33.00  . Pack years: 33.00  . Types: Cigarettes  . Last attempt to quit: 04/17/2017  . Years since quitting: 0.4  Smokeless Tobacco Never Used  Tobacco Comment   07/18/2017 "stopped ~several months ago"    Social History   Substance and Sexual Activity  Alcohol Use Yes   Comment: last 95     Allergies  Allergen Reactions  . Gabapentin Other (See Comments)    dizziness  . Statins Other (See Comments)    Muscle weakness    Current Facility-Administered Medications  Medication Dose Route Frequency Provider Last Rate Last Dose  . 0.9 %  sodium chloride infusion  250 mL Intravenous PRN Gold, Wayne E, PA-C      . acetaminophen (TYLENOL) tablet 650 mg  650 mg Oral Q6H PRN Gold, Wayne E, PA-C       Or  . acetaminophen (TYLENOL) suppository 650 mg  650 mg Rectal Q6H PRN Gold, Wayne E, PA-C      . ALPRAZolam Duanne Moron)  tablet 0.5 mg  0.5 mg Oral QHS PRN Gold, Wayne E, PA-C      . amLODipine (NORVASC) tablet 5 mg  5 mg Oral Daily Gold, Wayne E, PA-C      . aspirin EC tablet 81 mg  81 mg Oral Daily Gold, Wayne E, PA-C      . bisacodyl (DULCOLAX) EC tablet 5 mg  5 mg Oral Daily PRN Gold, Wayne E, PA-C      . calcium carbonate (TUMS - dosed in mg elemental calcium) chewable tablet 200 mg of elemental calcium  1 tablet Oral Daily PRN Gold, Wayne E, PA-C      . docusate sodium (COLACE) capsule 100 mg  100 mg Oral BID Gold, Wayne E, PA-C      . levalbuterol (XOPENEX) nebulizer solution 0.63 mg  0.63 mg Nebulization TID Gold, Wayne E, PA-C      . loratadine (CLARITIN) tablet 10 mg  10 mg Oral Daily Gold, Wayne E, PA-C      . magnesium hydroxide (MILK OF MAGNESIA) suspension 30 mL  30 mL Oral Daily PRN Gold, Wayne  E, PA-C      . ondansetron (ZOFRAN) tablet 4 mg  4 mg Oral Q6H PRN Gold, Wayne E, PA-C       Or  . ondansetron (ZOFRAN) injection 4 mg  4 mg Intravenous Q6H PRN Gold, Wayne E, PA-C      . pantoprazole (PROTONIX) EC tablet 40 mg  40 mg Oral QHS Gold, Wayne E, PA-C      . polyethylene glycol (MIRALAX / GLYCOLAX) packet 17 g  17 g Oral Daily PRN Grace Isaac, MD      . sodium chloride (OCEAN) 0.65 % nasal spray 1 spray  1 spray Each Nare PRN Gold, Wayne E, PA-C      . sodium chloride flush (NS) 0.9 % injection 3 mL  3 mL Intravenous Q12H Gold, Wayne E, PA-C      . sodium chloride flush (NS) 0.9 % injection 3 mL  3 mL Intravenous Q12H Gold, Wayne E, PA-C      . sodium chloride flush (NS) 0.9 % injection 3 mL  3 mL Intravenous PRN Gold, Wayne E, PA-C      . traMADol (ULTRAM) tablet 50 mg  50 mg Oral Q6H PRN Gold, Wayne E, PA-C      . umeclidinium-vilanterol (ANORO ELLIPTA) 62.5-25 MCG/INH 1 puff  1 puff Inhalation Daily Gold, Wayne E, PA-C       Facility-Administered Medications Ordered in Other Encounters  Medication Dose Route Frequency Provider Last Rate Last Dose  . 0.9 %  sodium chloride infusion  250 mL Intravenous PRN Gold, Wayne E, PA-C      . sodium chloride flush (NS) 0.9 % injection 3 mL  3 mL Intravenous Q12H Gold, Wayne E, PA-C      . sodium chloride flush (NS) 0.9 % injection 3 mL  3 mL Intravenous Q12H Gold, Wayne E, PA-C      . sodium chloride flush (NS) 0.9 % injection 3 mL  3 mL Intravenous PRN [name], PA-C           Physical Exam: BP (!) 83/67 (BP Location: Right Arm)   Temp 97.7 F (36.5 C) (Oral)   Resp (!) 22   Wt 192 lb 0.3 oz (87.1 kg)   SpO2 93%   BMI 27.16 kg/m   General appearance: alert, cooperative, no distress and hard of hearing  Heart: regular  rate and rhythm Lungs: diminished breath sounds LLL and wheezes LLL Wound: chest  tube with purulent material, surrounding  erythrmia    Diagnostic Studies & Laboratory data:     Recent Radiology  Findings:   Dg Chest 2 View  Result Date: 09/28/2017 CLINICAL DATA:  Left upper lobectomy, status post VATS on 09/19/2017 EXAM: CHEST  2 VIEW COMPARISON:  09/26/2017 FINDINGS: Small to moderate left hydropneumothorax with stable small left pneumothorax component an increasing pleural fluid component. Indwelling left chest tube. Extensive subcutaneous emphysema along the left lateral chest wall, stable versus mildly increased. Associated postsurgical changes in the left hemithorax with associated patchy left lower lobe opacity, likely atelectasis. Right lung is clear. The heart is normal in size. IMPRESSION: Small to moderate left hydropneumothorax with indwelling left chest tube. Pleural fluid component has mildly increased but the pneumothorax is stable. Extensive subcutaneous emphysema along the left lateral chest wall, stable versus mildly increased. Electronically Signed   By: Julian Hy M.D.   On: 09/28/2017 16:09      Recent Lab Findings: Lab Results  Component Value Date   WBC 14.2 (H) 09/22/2017   HGB 11.9 (L) 09/22/2017   HCT 35.9 (L) 09/22/2017   PLT 293 09/22/2017   GLUCOSE 116 (H) 09/22/2017   CHOL 129 02/21/2012   TRIG 129 02/21/2012   HDL 34 (L) 02/21/2012   LDLCALC 69 02/21/2012   ALT 15 (L) 09/21/2017   AST 20 09/21/2017   NA 133 (L) 09/22/2017   K 3.5 09/22/2017   CL 103 09/22/2017   CREATININE 1.21 09/22/2017   BUN 13 09/22/2017   CO2 24 09/22/2017   TSH 3.777 02/21/2012   INR 0.99 09/17/2017   HGBA1C 6.0 02/21/2012   Dg Chest 2 View  Result Date: 09/28/2017 CLINICAL DATA:  Left upper lobectomy, status post VATS on 09/19/2017 EXAM: CHEST  2 VIEW COMPARISON:  09/26/2017 FINDINGS: Small to moderate left hydropneumothorax with stable small left pneumothorax component an increasing pleural fluid component. Indwelling left chest tube. Extensive subcutaneous emphysema along the left lateral chest wall, stable versus mildly increased. Associated postsurgical  changes in the left hemithorax with associated patchy left lower lobe opacity, likely atelectasis. Right lung is clear. The heart is normal in size. IMPRESSION: Small to moderate left hydropneumothorax with indwelling left chest tube. Pleural fluid component has mildly increased but the pneumothorax is stable. Extensive subcutaneous emphysema along the left lateral chest wall, stable versus mildly increased. Electronically Signed   By: Julian Hy M.D.   On: 09/28/2017 16:09     Assessment / Plan: Chest x-ray results are as noted above.  The tube is clotted off by proteinaceous material.  I have discussed this with Dr. Servando Snare.  We have decided to admit the patient and placed back on a Pleur-evac drainage device.  Additionally will obtain a CT scan of the chest without contrast.  He does some purulence so we will start on vancomycin and Zosyn and obtain cultures.    Jadene Pierini PA    I have seen and examined Nathaniel Long and agree with the above assessment  and plan.  Grace Isaac MD Beeper [phone] Office [phone] 09/28/2017 6:26 PM     Grace Isaac, MD 09/28/2017 6:23 PM

## 2017-09-28 NOTE — Significant Event (Signed)
Patient arrived into room, transported in a wheelchair by volunteer. Patient arrived with spouse, has a left chest tube, to waterseal. Per patient and spouse, just arrived from office of [facility]. Patient is alerted, oriented. Oriented to room.     Silvanna Ohmer

## 2017-09-28 NOTE — Progress Notes (Signed)
[address]       [place] [address]             [phone]      Donny S Gibbons  Medical Record #[id number] Date of Birth: April 30, 1941  Referring: Marshell Garfinkel, MD Primary Care: [name], MD Primary Cardiologist: No primary care provider on file.   Chief Complaint:   POST OP FOLLOW UP  OF PROCEDURE:  09/19/2017 DATE OF DISCHARGE:                              OPERATIVE REPORT   PREOPERATIVE DIAGNOSIS:  Adenocarcinoma, left upper lobe, clinical stage IA.  POSTOPERATIVE DIAGNOSIS:  Adenocarcinoma, left upper lobe, clinical stage IA.  PROCEDURES:  Left video-assisted thoracoscopy, thoracoscopic left upper lobectomy, mediastinal lymph node dissection, On-Q local anesthetic catheter placement.  SURGEON:  Revonda Standard. Roxan Hockey, MD.  ASSISTANT:  Jadene Pierini, PA-C.  ANESTHESIA:  General.   History of Present Illness:     The patient is seen in the office on today's date following the above procedure.  At time of discharge he went home with a mini express due to persistent air leak with small space/pneumothorax.  The patient noted that the tube does not appear to be functional currently blocked with proteinaceous material.  There has been no significant drainage since discharge.  Additionally, there is some mild erythema site.  Chest x-ray on today's date shows some increased effusion with a stable space.  He is asymptomatic at this point without significant shortness of breath.     Past Medical History:  Diagnosis Date  . Anxiety   . Carotid artery stenosis   . Chronic lower back pain   . CKD (chronic kidney disease)   . Complication of anesthesia    "can't pee when I come out of OR" (07/18/2017)  . Contact with chainsaw as cause of accidental injury 07/16/2017   sternal fracture with bilateral anterior fifth and sixth rib fractures /notes 07/16/2017  . COPD (chronic obstructive pulmonary disease) ([place])   . Dyspnea   .  GERD (gastroesophageal reflux disease)   . Hard of hearing   . High cholesterol   . Hypertension   . Thoracic ascending aortic aneurysm [facility])      Social History   Tobacco Use  Smoking Status Former Smoker  . Packs/day: 1.00  . Years: 33.00  . Pack years: 33.00  . Types: Cigarettes  . Last attempt to quit: 04/17/2017  . Years since quitting: 0.4  Smokeless Tobacco Never Used  Tobacco Comment   07/18/2017 "stopped ~several months ago"    Social History   Substance and Sexual Activity  Alcohol Use Yes   Comment: last 95     Allergies  Allergen Reactions  . Gabapentin Other (See Comments)    dizziness  . Statins Other (See Comments)    Muscle weakness    Current Outpatient Medications  Medication Sig Dispense Refill  . acetaminophen (TYLENOL) 325 MG tablet Take 1 tablet (325 mg total) by mouth every 6 (six) hours as needed for moderate pain or headache.    . albuterol (PROVENTIL) (2.5 MG/3ML) 0.083% nebulizer solution Take 3 mLs (2.5 mg total) by nebulization every 6 (six) hours as needed for wheezing or shortness of breath. Dx:C34.12 120 mL 11  . ALPRAZolam (XANAX) 0.5 MG tablet Take 1 tablet (0.5 mg total) by mouth at bedtime as needed for anxiety.  0  . amLODipine (NORVASC) 5 MG tablet Take 5 mg daily by mouth.  0  . aspirin EC 81 MG tablet Take 1 tablet (81 mg total) by mouth daily. 30 tablet 2  . calcium carbonate (TUMS - DOSED IN MG ELEMENTAL CALCIUM) 500 MG chewable tablet Chew 1 tablet by mouth daily as needed for indigestion or heartburn.    . cetirizine (ZYRTEC) 10 MG tablet TAKE 1 TABLET BY MOUTH AT BEDTIME (Patient taking differently: Take 10 mg daily as needed for allergies) 31 tablet 3  . Cholecalciferol (VITAMIN D) 2000 units CAPS Take 4,000 Units by mouth every evening.    . clotrimazole (LOTRIMIN) 1 % cream Apply 1 application topically 2 (two) times daily.    . fish oil-omega-3 fatty acids 1000 MG capsule Take 1 g by mouth every evening.     . Multiple  Vitamins-Minerals (CENTRUM PO) Take 1 tablet by mouth every evening.     Marland Kitchen oxyCODONE (OXY IR/ROXICODONE) 5 MG immediate release tablet Take 1 tablet (5 mg total) by mouth every 6 (six) hours as needed for moderate pain. 30 tablet 0  . pantoprazole (PROTONIX) 40 MG tablet Take 40 mg by mouth at bedtime.     . Pitavastatin Calcium (LIVALO) 4 MG TABS Take 2 mg by mouth every evening.     . sodium chloride (OCEAN) 0.65 % SOLN nasal spray Place 1 spray into both nostrils as needed for congestion.    Marland Kitchen umeclidinium-vilanterol (ANORO ELLIPTA) 62.5-25 MCG/INH AEPB Inhale 1 puff daily into the lungs. 60 each 3   No current facility-administered medications for this visit.        Physical Exam: BP 94/64 (BP Location: Left Arm, Patient Position: Sitting, Cuff Size: Large)   Pulse 94   Ht 5' 10.5" (1.791 m)   Wt 192 lb (87.1 kg)   SpO2 92% Comment: on room air  BMI 27.16 kg/m   General appearance: alert, cooperative and no distress Heart: regular rate and rhythm Lungs: dim left lower fields Wound: incis healing well   Diagnostic Studies & Laboratory data:     Recent Radiology Findings:   No results found.    Recent Lab Findings: Lab Results  Component Value Date   WBC 14.2 (H) 09/22/2017   HGB 11.9 (L) 09/22/2017   HCT 35.9 (L) 09/22/2017   PLT 293 09/22/2017   GLUCOSE 116 (H) 09/22/2017   CHOL 129 02/21/2012   TRIG 129 02/21/2012   HDL 34 (L) 02/21/2012   LDLCALC 69 02/21/2012   ALT 15 (L) 09/21/2017   AST 20 09/21/2017   NA 133 (L) 09/22/2017   K 3.5 09/22/2017   CL 103 09/22/2017   CREATININE 1.21 09/22/2017   BUN 13 09/22/2017   CO2 24 09/22/2017   TSH 3.777 02/21/2012   INR 0.99 09/17/2017   HGBA1C 6.0 02/21/2012   Dg Chest 2 View  Result Date: 09/28/2017 CLINICAL DATA:  Left upper lobectomy, status post VATS on 09/19/2017 EXAM: CHEST  2 VIEW COMPARISON:  09/26/2017 FINDINGS: Small to moderate left hydropneumothorax with stable small left pneumothorax component  an increasing pleural fluid component. Indwelling left chest tube. Extensive subcutaneous emphysema along the left lateral chest wall, stable versus mildly increased. Associated postsurgical changes in the left hemithorax with associated patchy left lower lobe opacity, likely atelectasis. Right lung is clear. The heart is normal in size. IMPRESSION: Small to moderate left hydropneumothorax with indwelling left chest tube. Pleural fluid component has mildly increased but the pneumothorax is stable.  Extensive subcutaneous emphysema along the left lateral chest wall, stable versus mildly increased. Electronically Signed   By: Julian Hy M.D.   On: 09/28/2017 16:09     Assessment / Plan: Chest x-ray results are as noted above.  The tube is clotted off by proteinaceous material.  I have discussed this with Dr. Servando Snare.  We have decided to admit the patient and placed back on a Pleur-evac drainage device.  Additionally will obtain a CT scan of the chest without contrast.  He does some purulence so we will start on vancomycin and Zosyn and obtain cultures.         [name], PA-C 09/28/2017 3:36 PM

## 2017-09-28 NOTE — Patient Instructions (Signed)
readmit

## 2017-09-28 NOTE — Progress Notes (Signed)
Pharmacy Antibiotic Note  Nathaniel Long is a 76 y.o. male admitted on 09/28/2017 with pneumonia vs early empyema .  Pharmacy has been consulted for vancomycin and zosyn dosing.  Patient underwent L VAT with mediastinal lymph node dissection and On-Q local anesthetic catheter placement. In office today, the tube was not functional d/t blockade with proteinaceous material with some mild erythema site. CXR showed increased effusion with a stable space. WBC is 22.2. Scr is still increased at 1.59 (CrCl 41 mL/min).   Plan: Vancomycin 1750 mg once Vancomycin 750 mg IV every 12 hours.  Goal trough 15-20 mcg/mL. Zosyn 3.375g IV q8h (4 hour infusion). Monitor renal function, clinical picture, cx results, and VT as needed  Height: 5\' 10"  (177.8 cm) Weight: 192 lb 0.3 oz (87.1 kg) IBW/kg (Calculated) : 73  Temp (24hrs), Avg:97.7 F (36.5 C), Min:97.7 F (36.5 C), Max:97.7 F (36.5 C)  Recent Labs  Lab 09/22/17 0320 09/28/17 1919  WBC 14.2* 22.2*  CREATININE 1.21 1.59*    Estimated Creatinine Clearance: 40.8 mL/min (A) (by C-G formula based on SCr of 1.59 mg/dL (H)).    Allergies  Allergen Reactions  . Gabapentin Other (See Comments)    dizziness  . Statins Other (See Comments)    Muscle weakness    Antimicrobials this admission: Vancomycin 12/28 >>  Zosyn 12/28 >>   Dose adjustments this admission: N/A  Microbiology results: 12/28 BCx: sent 12/28 Chest sinus drainage: few WBC, abundant GPC  Thank you for allowing pharmacy to be a part of this patient's care.  Doylene Canard, PharmD Clinical Pharmacist  Pager: [phone] Phone: [phone] 09/28/2017 9:44 PM

## 2017-09-29 ENCOUNTER — Other Ambulatory Visit: Payer: Self-pay

## 2017-09-29 ENCOUNTER — Inpatient Hospital Stay (HOSPITAL_COMMUNITY): Payer: Medicare Other

## 2017-09-29 LAB — CBC
HEMATOCRIT: 29.3 % — AB (ref 39.0–52.0)
Hemoglobin: 9.6 g/dL — ABNORMAL LOW (ref 13.0–17.0)
MCH: 30.2 pg (ref 26.0–34.0)
MCHC: 32.8 g/dL (ref 30.0–36.0)
MCV: 92.1 fL (ref 78.0–100.0)
Platelets: 447 10*3/uL — ABNORMAL HIGH (ref 150–400)
RBC: 3.18 MIL/uL — ABNORMAL LOW (ref 4.22–5.81)
RDW: 15.1 % (ref 11.5–15.5)
WBC: 20.2 10*3/uL — ABNORMAL HIGH (ref 4.0–10.5)

## 2017-09-29 LAB — BASIC METABOLIC PANEL
Anion gap: 12 (ref 5–15)
BUN: 18 mg/dL (ref 6–20)
CHLORIDE: 95 mmol/L — AB (ref 101–111)
CO2: 23 mmol/L (ref 22–32)
CREATININE: 1.51 mg/dL — AB (ref 0.61–1.24)
Calcium: 7.8 mg/dL — ABNORMAL LOW (ref 8.9–10.3)
GFR calc Af Amer: 50 mL/min — ABNORMAL LOW (ref >60)
GFR, EST NON AFRICAN AMERICAN: 43 mL/min — AB (ref >60)
GLUCOSE: 117 mg/dL — AB (ref 65–99)
POTASSIUM: 3.5 mmol/L (ref 3.5–5.1)
Sodium: 130 mmol/L — ABNORMAL LOW (ref 135–145)

## 2017-09-29 MED ORDER — LEVALBUTEROL HCL 0.63 MG/3ML IN NEBU
0.6300 mg | INHALATION_SOLUTION | Freq: Four times a day (QID) | RESPIRATORY_TRACT | Status: AC | PRN
  Administered 2017-09-29: 0.63 mg via RESPIRATORY_TRACT

## 2017-09-29 MED ORDER — POTASSIUM CHLORIDE CRYS ER 20 MEQ PO TBCR
20.0000 meq | EXTENDED_RELEASE_TABLET | ORAL | Status: DC | PRN
  Administered 2017-09-29: 20 meq via ORAL
  Filled 2017-09-29: qty 1

## 2017-09-29 NOTE — Plan of Care (Signed)
Pt and nurse discussed the importance of turning himself in bed w/ and w/o being prompted in order to decrease risk of impaired skin integrity.

## 2017-09-29 NOTE — Progress Notes (Signed)
Subjective: No complaints  Objective: Vital signs in last 24 hours: Temp:  [97.7 F (36.5 C)-99 F (37.2 C)] 98.3 F (36.8 C) (12/29 0700) Pulse Rate:  [94] 94 (12/28 1507) Cardiac Rhythm: Normal sinus rhythm (12/29 0800) Resp:  [15-28] 19 (12/29 1000) BP: (83-147)/(53-78) 101/56 (12/29 1000) SpO2:  [90 %-97 %] 90 % (12/29 1000) Weight:  [87.1 kg (192 lb)-87.1 kg (192 lb 0.3 oz)] 87.1 kg (192 lb 0.3 oz) (12/28 1806)  Hemodynamic parameters for last 24 hours:    Intake/Output from previous day: 12/28 0701 - 12/29 0700 In: 600 [IV Piggyback:600] Out: 475 [Urine:475] Intake/Output this shift: Total I/O In: 390 [P.O.:240; IV Piggyback:150] Out: 0   General appearance: alert and cooperative Heart: regular rate and rhythm, S1, S2 normal, no murmur, click, rub or gallop Lungs: rales over left lower lobe chest tube with minimal purulent drainage.  Lab Results: Recent Labs    09/28/17 1919 09/29/17 0346  WBC 22.2* 20.2*  HGB 9.7* 9.6*  HCT 29.2* 29.3*  PLT 466* 447*   BMET:  Recent Labs    09/28/17 1919 09/29/17 0346  NA 131* 130*  K 3.8 3.5  CL 94* 95*  CO2 24 23  GLUCOSE 128* 117*  BUN 22* 18  CREATININE 1.59* 1.51*  CALCIUM 8.1* 7.8*    PT/INR: No results for input(s): LABPROT, INR in the last 72 hours. ABG    Component Value Date/Time   PHART 7.348 (L) 09/20/2017 0515   HCO3 21.8 09/20/2017 0515   ACIDBASEDEF 3.0 (H) 09/20/2017 0515   O2SAT 98.2 09/20/2017 0515   CBG (last 3)  No results for input(s): GLUCAP in the last 72 hours.  CLINICAL DATA:  Status post left upper lobe lobectomy  EXAM: CT CHEST WITHOUT CONTRAST  TECHNIQUE: Multidetector CT imaging of the chest was performed following the standard protocol without IV contrast.  COMPARISON:  Cereal radiographs 09/28/2017, dating back to 08/10/2017, previous CT chest 08/17/2017, 08/10/2017, 07/16/2017  FINDINGS: Cardiovascular: Limited evaluation without intravenous  contrast. Moderate atherosclerotic calcifications of the aorta. Mild aneurysmal dilatation of the ascending aorta measuring up to 4.2 cm. Coronary artery calcification. Normal heart size. Trace pericardial effusion.  Mediastinum/Nodes: No thyroid mass. Esophagus within normal limits. 1 cm precarinal lymph node. 12 mm subcarinal lymph node. Pneumomediastinum is present with gas adjacent to the aorta, extending superiorly through the thoracic inlet.  Lungs/Pleura: Moderate left hydropneumothorax. Patient is status post left upper lobectomy. Left lateral lower chest tube with scattered fluid or debris throughout the tube. Chest tube terminates along the left medial upper pleural space. Interstitial thickening throughout the left lower lobe, could be secondary to edema or infection. Multiple locules of gas and fluid in the posterior left lower lobe. There is emphysema in the right lung. There is a small right pleural effusion.  Upper Abdomen: No acute finding  Musculoskeletal: Old sternal and rib fractures. Extensive subcutaneous emphysema throughout the left chest wall anteriorly, laterally and posteriorly extending to the upper abdomen.  IMPRESSION: 1. Status post left upper lobe lobectomy. Moderate hydropneumothorax on the left side with most of the air collection at the left lung apex, similar to the comparison radiographs. A left lateral ascending chest tube is in place ; there appears to be fluid or debris scattered along the course of the tube, correlate for tube function 2. Gas and fluid collection in the left lower lobe appears different from preoperative CTs, could relate to fluid containing no pneumatoceles. There is diffuse interstitial density throughout the left  lower lobe which may reflect edema or less likely infection. Small right pleural effusion and atelectasis at the right base. Mild to moderate emphysema in the right upper lobe. 3. Persistent extensive  subcutaneous emphysema involving the left chest wall with pneumomediastinum present, query air leak. 4. Stable mild mediastinal lymph nodes. 5. Old rib and sternal fractures.  Aortic Atherosclerosis (ICD10-I70.0) and Emphysema (ICD10-J43.9).   Electronically Signed   By: Donavan Foil M.D.   On: 09/28/2017 22:49  Assessment/Plan:  S/p left upper lobectomy with left hydropneumothorax that is probably an empyema and diffuse interstitial density in the lower lobe that may be pneumonia. He has a chest tube in but it is clogged up with debri and not draining much. He may require insertion of a chest tube into the apical space or surgical drainage. Will continue broad spectrum antibiotics and see what Dr. Roxan Hockey thinks on Monday. He is doing well clinically so far with leukocytosis but no significant fever. Start IS, ambulation.  LOS: 1 day    Nathaniel Long 09/29/2017

## 2017-09-29 NOTE — Progress Notes (Signed)
Potassium 3.5, KCL replacement protocol initiated. Pt will be given 3mEq PO.

## 2017-09-29 NOTE — Plan of Care (Signed)
Pt and this RN discussed how "Colace" can help bowel motility and pt agreed to take med.

## 2017-09-29 NOTE — Progress Notes (Signed)
Patient ID: Nathaniel Long, male   DOB: 11-12-1940, 75 y.o.   MRN: [id number] [facility] Evening Rounds:  Hemodynamically stable, remains afebrile  Ambulated today  Wheezing after walks but clears.

## 2017-09-30 ENCOUNTER — Inpatient Hospital Stay (HOSPITAL_COMMUNITY): Payer: Medicare Other

## 2017-09-30 LAB — CBC
HEMATOCRIT: 28.6 % — AB (ref 39.0–52.0)
Hemoglobin: 9.6 g/dL — ABNORMAL LOW (ref 13.0–17.0)
MCH: 30.7 pg (ref 26.0–34.0)
MCHC: 33.6 g/dL (ref 30.0–36.0)
MCV: 91.4 fL (ref 78.0–100.0)
PLATELETS: 515 10*3/uL — AB (ref 150–400)
RBC: 3.13 MIL/uL — ABNORMAL LOW (ref 4.22–5.81)
RDW: 15.3 % (ref 11.5–15.5)
WBC: 19.8 10*3/uL — ABNORMAL HIGH (ref 4.0–10.5)

## 2017-09-30 LAB — EXPECTORATED SPUTUM ASSESSMENT W REFEX TO RESP CULTURE

## 2017-09-30 LAB — EXPECTORATED SPUTUM ASSESSMENT W GRAM STAIN, RFLX TO RESP C: Special Requests: NORMAL

## 2017-09-30 LAB — BASIC METABOLIC PANEL
Anion gap: 9 (ref 5–15)
BUN: 17 mg/dL (ref 6–20)
CALCIUM: 7.8 mg/dL — AB (ref 8.9–10.3)
CO2: 24 mmol/L (ref 22–32)
Chloride: 99 mmol/L — ABNORMAL LOW (ref 101–111)
Creatinine, Ser: 1.53 mg/dL — ABNORMAL HIGH (ref 0.61–1.24)
GFR calc Af Amer: 49 mL/min — ABNORMAL LOW (ref >60)
GFR, EST NON AFRICAN AMERICAN: 42 mL/min — AB (ref >60)
GLUCOSE: 129 mg/dL — AB (ref 65–99)
Potassium: 3.4 mmol/L — ABNORMAL LOW (ref 3.5–5.1)
Sodium: 132 mmol/L — ABNORMAL LOW (ref 135–145)

## 2017-09-30 NOTE — Progress Notes (Signed)
Patient ID: Nathaniel Long, male   DOB: [birth year], 76 y.o.   MRN: [id number] [facility] Evening Rounds:  Hemodynamically stable Tmax 99.4  sats 94% No problems today.

## 2017-09-30 NOTE — Progress Notes (Signed)
  Subjective:  No complaints. Coughing up some sputum.  Objective: Vital signs in last 24 hours: Temp:  [97.6 F (36.4 C)-98.7 F (37.1 C)] 97.7 F (36.5 C) (12/30 0746) Pulse Rate:  [75] 75 (12/29 2021) Cardiac Rhythm: Normal sinus rhythm (12/30 0800) Resp:  [13-30] 21 (12/30 0800) BP: (105-151)/(49-82) 110/49 (12/30 0800) SpO2:  [90 %-99 %] 93 % (12/30 0800) Weight:  [87.5 kg (192 lb 14.4 oz)] 87.5 kg (192 lb 14.4 oz) (12/30 0610)  Hemodynamic parameters for last 24 hours:    Intake/Output from previous day: 12/29 0701 - 12/30 0700 In: 1290 [P.O.:840; IV Piggyback:450] Out: 1160 [Urine:1150; Chest Tube:10] Intake/Output this shift: Total I/O In: 390 [P.O.:240; IV Piggyback:150] Out: 0   General appearance: alert and cooperative Heart: regular rate and rhythm, S1, S2 normal, no murmur, click, rub or gallop Lungs: rales LLL Wound: incisions ok minimal purulent chest tube drainage.  Lab Results: Recent Labs    09/29/17 0346 09/30/17 0210  WBC 20.2* 19.8*  HGB 9.6* 9.6*  HCT 29.3* 28.6*  PLT 447* 515*   BMET:  Recent Labs    09/29/17 0346 09/30/17 0619  NA 130* 132*  K 3.5 3.4*  CL 95* 99*  CO2 23 24  GLUCOSE 117* 129*  BUN 18 17  CREATININE 1.51* 1.53*  CALCIUM 7.8* 7.8*    PT/INR: No results for input(s): LABPROT, INR in the last 72 hours. ABG    Component Value Date/Time   PHART 7.348 (L) 09/20/2017 0515   HCO3 21.8 09/20/2017 0515   ACIDBASEDEF 3.0 (H) 09/20/2017 0515   O2SAT 98.2 09/20/2017 0515   CBG (last 3)  No results for input(s): GLUCAP in the last 72 hours.  Assessment/Plan: S/p left upper lobectomy with left hydropneumothorax that is probably an empyema and diffuse interstitial density in the lower lobe that may be pneumonia. He has a chest tube in but it is clogged up with debri and not draining much. He may require insertion of a chest tube into the apical space or surgical drainage. Will continue broad spectrum antibiotics and  see what Dr. Roxan Hockey thinks on Monday. Clinically he is doing well with no fever and WBC trend down slightly since admission. Culture of chest tube drainage growing abundant staph. Send sputum culture. I reviewed CXR and CT images with his wife and answered her questions.     LOS: 2 days    Gaye Pollack 09/30/2017

## 2017-10-01 ENCOUNTER — Inpatient Hospital Stay (HOSPITAL_COMMUNITY): Payer: Medicare Other

## 2017-10-01 ENCOUNTER — Ambulatory Visit: Payer: Self-pay | Admitting: Thoracic Surgery (Cardiothoracic Vascular Surgery)

## 2017-10-01 ENCOUNTER — Encounter (HOSPITAL_COMMUNITY): Payer: Self-pay | Admitting: *Deleted

## 2017-10-01 LAB — CBC
HCT: 29.1 % — ABNORMAL LOW (ref 39.0–52.0)
HEMOGLOBIN: 9.9 g/dL — AB (ref 13.0–17.0)
MCH: 30.9 pg (ref 26.0–34.0)
MCHC: 34 g/dL (ref 30.0–36.0)
MCV: 90.9 fL (ref 78.0–100.0)
PLATELETS: 547 10*3/uL — AB (ref 150–400)
RBC: 3.2 MIL/uL — AB (ref 4.22–5.81)
RDW: 15.4 % (ref 11.5–15.5)
WBC: 21.3 10*3/uL — AB (ref 4.0–10.5)

## 2017-10-01 LAB — BASIC METABOLIC PANEL
ANION GAP: 12 (ref 5–15)
BUN: 17 mg/dL (ref 6–20)
CHLORIDE: 98 mmol/L — AB (ref 101–111)
CO2: 22 mmol/L (ref 22–32)
Calcium: 7.6 mg/dL — ABNORMAL LOW (ref 8.9–10.3)
Creatinine, Ser: 1.54 mg/dL — ABNORMAL HIGH (ref 0.61–1.24)
GFR calc Af Amer: 49 mL/min — ABNORMAL LOW (ref >60)
GFR, EST NON AFRICAN AMERICAN: 42 mL/min — AB (ref >60)
Glucose, Bld: 140 mg/dL — ABNORMAL HIGH (ref 65–99)
POTASSIUM: 3.3 mmol/L — AB (ref 3.5–5.1)
SODIUM: 132 mmol/L — AB (ref 135–145)

## 2017-10-01 LAB — VANCOMYCIN, TROUGH: VANCOMYCIN TR: 14 ug/mL — AB (ref 15–20)

## 2017-10-01 MED ORDER — ENOXAPARIN SODIUM 30 MG/0.3ML ~~LOC~~ SOLN
30.0000 mg | SUBCUTANEOUS | Status: DC
  Administered 2017-10-02 – 2017-10-06 (×5): 30 mg via SUBCUTANEOUS
  Filled 2017-10-01 (×5): qty 0.3

## 2017-10-01 MED ORDER — FENTANYL CITRATE (PF) 100 MCG/2ML IJ SOLN
INTRAMUSCULAR | Status: AC | PRN
  Administered 2017-10-01: 25 ug via INTRAVENOUS

## 2017-10-01 MED ORDER — MAGNESIUM HYDROXIDE 400 MG/5ML PO SUSP
15.0000 mL | Freq: Once | ORAL | Status: AC
  Administered 2017-10-01: 15 mL via ORAL
  Filled 2017-10-01: qty 30

## 2017-10-01 MED ORDER — FENTANYL CITRATE (PF) 100 MCG/2ML IJ SOLN
INTRAMUSCULAR | Status: AC
  Filled 2017-10-01: qty 2

## 2017-10-01 MED ORDER — MIDAZOLAM HCL 2 MG/2ML IJ SOLN
INTRAMUSCULAR | Status: AC
  Filled 2017-10-01: qty 2

## 2017-10-01 MED ORDER — LIDOCAINE HCL 1 % IJ SOLN
INTRAMUSCULAR | Status: AC
  Filled 2017-10-01: qty 20

## 2017-10-01 NOTE — Progress Notes (Signed)
Pharmacy Antibiotic Note  Nathaniel Long is a 76 y.o. male admitted with MRSA PNA/empyemia (Patient underwent L VAT with mediastinal lymph node dissection on 09/19/2017). Pharmacy has been consulted for vancomycin and zosyn dosing. -WBC= 21.3, afebrile, SCr= 1.54 and CrCl ~ 40   Plan: -Will check a vancomycin trough today -Consider d/c zosyn? -Will follow renal function, cultures and clinical progress   Height: 5\' 10"  (177.8 cm) Weight: 192 lb 14.4 oz (87.5 kg) IBW/kg (Calculated) : 73  Temp (24hrs), Avg:99.1 F (37.3 C), Min:98.2 F (36.8 C), Max:99.5 F (37.5 C)  Recent Labs  Lab 09/28/17 1919 09/29/17 0346 09/30/17 0210 09/30/17 0619 10/01/17 0247  WBC 22.2* 20.2* 19.8*  --  21.3*  CREATININE 1.59* 1.51*  --  1.53* 1.54*    Estimated Creatinine Clearance: 42.1 mL/min (A) (by C-G formula based on SCr of 1.54 mg/dL (H)).    Allergies  Allergen Reactions  . Gabapentin Other (See Comments)    dizziness  . Statins Other (See Comments)    Muscle weakness    Antimicrobials this admission: Vancomycin 12/28 >>  Zosyn 12/28 >>   Dose adjustments this admission: N/A  Microbiology results: 12/30 resp- pending 12/28 Chest Drainage: MRSA 12/28BCx: NGTD  Thank you for allowing pharmacy to be a part of this patient's care.  Hildred Laser, Pharm D 10/01/2017 8:44 AM

## 2017-10-01 NOTE — Consult Note (Signed)
Chief Complaint: Patient was seen in consultation today for hydropneumothorax  Referring Physician(s): Dr. Roxan Hockey  Supervising Physician: Daryll Brod  Patient Status: [facility] - In-pt  History of Present Illness: Nathaniel Long is a 76 y.o. male with past medical history of CAD, CKD, COPD, GERD, who recently underwent left upper lobectomy and VATS on 12/19 for adenocarcinoma of the left upper lobe.  He was discharged with chest tube in place, however presented to [facility]CTS follow-up appointment with clogged tube on 09/28/17. He was admitted for suspected empyema and for chest tube management.  IR consulted for placement of a new apical chest tube.  Case reviewed by Dr. Annamaria Boots who approves patient for procedure.   Past Medical History:  Diagnosis Date  . Anxiety   . Carotid artery stenosis   . Chronic lower back pain   . CKD (chronic kidney disease)   . Complication of anesthesia    "can't pee when I come out of OR" (07/18/2017)  . Contact with chainsaw as cause of accidental injury 07/16/2017   sternal fracture with bilateral anterior fifth and sixth rib fractures /notes 07/16/2017  . COPD (chronic obstructive pulmonary disease) ([place])   . Dyspnea   . GERD (gastroesophageal reflux disease)   . Hard of hearing   . High cholesterol   . Hypertension   . Thoracic ascending aortic aneurysm [facility])     Past Surgical History:  Procedure Laterality Date  . ABDOMINAL AORTOGRAM W/LOWER EXTREMITY N/A 05/01/2017   Procedure: Abdominal Aortogram w/Lower Extremity;  Surgeon: Adrian Prows, MD;  Location: [place] [facility];  Service: Cardiovascular;  Laterality: N/A;  . BACK SURGERY    . FINGER REPLANTATION Left    "ring finger"  . HEMORRHOID SURGERY    . POSTERIOR LUMBAR FUSION  1986  . VIDEO ASSISTED THORACOSCOPY (VATS)/ LOBECTOMY Left 09/19/2017   Procedure: LEFT VIDEO ASSISTED THORACOSCOPY (VATS)/ LEFT UPPER LOBECTOMY, LYMPH NODE DISECTION;  Surgeon: Melrose Nakayama, MD;   Location: [place];  Service: Thoracic;  Laterality: Left;  Marland Kitchen VIDEO BRONCHOSCOPY WITH ENDOBRONCHIAL NAVIGATION N/A 09/05/2017   Procedure: VIDEO BRONCHOSCOPY WITH ENDOBRONCHIAL NAVIGATION;  Surgeon: Collene Gobble, MD;  Location: [place];  Service: Thoracic;  Laterality: N/A;  . VIDEO BRONCHOSCOPY WITH ENDOBRONCHIAL ULTRASOUND N/A 09/05/2017   Procedure: VIDEO BRONCHOSCOPY WITH ENDOBRONCHIAL ULTRASOUND;  Surgeon: Collene Gobble, MD;  Location: [facility];  Service: Thoracic;  Laterality: N/A;    Allergies: Gabapentin and Statins  Medications: Prior to Admission medications   Medication Sig Start Date End Date Taking? Authorizing Provider  acetaminophen (TYLENOL) 325 MG tablet Take 1 tablet (325 mg total) by mouth every 6 (six) hours as needed for moderate pain or headache. 09/05/17  Yes Collene Gobble, MD  ALPRAZolam Duanne Moron) 0.5 MG tablet Take 1 tablet (0.5 mg total) by mouth at bedtime as needed for anxiety. 09/26/17  Yes Gold, Wayne E, PA-C  amLODipine (NORVASC) 5 MG tablet Take 5 mg daily by mouth. 08/09/17  Yes [provider]  aspirin EC 81 MG tablet Take 1 tablet (81 mg total) by mouth daily. 07/20/17 07/20/18 Yes Simaan, Darci Current, PA-C  calcium carbonate (TUMS - DOSED IN MG ELEMENTAL CALCIUM) 500 MG chewable tablet Chew 1 tablet by mouth daily as needed for indigestion or heartburn.   Yes [provider]  cetirizine (ZYRTEC) 10 MG tablet TAKE 1 TABLET BY MOUTH AT BEDTIME Patient taking differently: Take 10 mg daily as needed for allergies 06/16/11  Yes Lyndal Pulley, DO  Cholecalciferol (VITAMIN D) 2000 units CAPS Take 4,000 Units by mouth every evening.   Yes [provider]  clotrimazole (LOTRIMIN) 1 % cream Apply 1 application topically 2 (two) times daily.   Yes [provider]  fish oil-omega-3 fatty acids 1000 MG capsule Take 1 g by mouth every evening.    Yes [provider]  Multiple Vitamins-Minerals (CENTRUM PO) Take 1 tablet by mouth  every evening.    Yes [provider]  oxyCODONE (OXY IR/ROXICODONE) 5 MG immediate release tablet Take 1 tablet (5 mg total) by mouth every 6 (six) hours as needed for moderate pain. 09/26/17  Yes Gold, Wayne E, PA-C  pantoprazole (PROTONIX) 40 MG tablet Take 40 mg by mouth at bedtime.    Yes [provider]  Pitavastatin Calcium (LIVALO) 4 MG TABS Take 2 mg by mouth every evening.    Yes [provider]  polyethylene glycol (MIRALAX / GLYCOLAX) packet Take 17 g by mouth daily as needed for mild constipation.   Yes [provider]  sodium chloride (OCEAN) 0.65 % SOLN nasal spray Place 1 spray into both nostrils as needed for congestion.   Yes [provider]  umeclidinium-vilanterol (ANORO ELLIPTA) 62.5-25 MCG/INH AEPB Inhale 1 puff daily into the lungs. 08/13/17  Yes Mannam, Praveen, MD  albuterol (PROVENTIL) (2.5 MG/3ML) 0.083% nebulizer solution Take 3 mLs (2.5 mg total) by nebulization every 6 (six) hours as needed for wheezing or shortness of breath. Dx:C34.12 09/28/17   Marshell Garfinkel, MD     Family History  Problem Relation Age of Onset  . Colon polyps Brother   . Dementia Sister     Social History   Socioeconomic History  . Marital status: Married    Spouse name: Not on file  . Number of children: Not on file  . Years of education: Not on file  . Highest education level: Not on file  Social Needs  . Financial resource strain: Not on file  . Food insecurity - worry: Not on file  . Food insecurity - inability: Not on file  . Transportation needs - medical: Not on file  . Transportation needs - non-medical: Not on file  Occupational History  . Not on file  Tobacco Use  . Smoking status: Former Smoker    Packs/day: 1.00    Years: 33.00    Pack years: 33.00    Types: Cigarettes    Last attempt to quit: 04/17/2017    Years since quitting: 0.4  . Smokeless tobacco: Never Used  . Tobacco comment: 07/18/2017 "stopped ~several  months ago"  Substance and Sexual Activity  . Alcohol use: Yes    Comment: last 95  . Drug use: No  . Sexual activity: Not on file  Other Topics Concern  . Not on file  Social History Narrative  . Not on file   Review of Systems  Unable to perform ROS: Mental status change   Vital Signs: BP 106/70   Pulse 70   Temp 98.8 F (37.1 C) (Oral)   Resp (!) 30   Ht '5\' 10"'  (1.778 m)   Wt 192 lb 14.4 oz (87.5 kg)   SpO2 93%   BMI 27.68 kg/m   Physical Exam  Constitutional: He appears well-developed.  Cardiovascular: Normal rate, regular rhythm and normal heart sounds.  Pulmonary/Chest: No respiratory distress. He has wheezes (expiratory).  Decreased breath sounds  Neurological:  Arouses, but does not participate in conversation  Skin: Skin is warm and dry.  Nursing note and vitals reviewed.   Imaging: Dg Chest 2 View  Result Date: 09/28/2017 CLINICAL DATA:  Left upper lobectomy, status post VATS on 09/19/2017 EXAM: CHEST  2 VIEW COMPARISON:  09/26/2017 FINDINGS: Small to moderate left hydropneumothorax with stable small left pneumothorax component an increasing pleural fluid component. Indwelling left chest tube. Extensive subcutaneous emphysema along the left lateral chest wall, stable versus mildly increased. Associated postsurgical changes in the left hemithorax with associated patchy left lower lobe opacity, likely atelectasis. Right lung is clear. The heart is normal in size. IMPRESSION: Small to moderate left hydropneumothorax with indwelling left chest tube. Pleural fluid component has mildly increased but the pneumothorax is stable. Extensive subcutaneous emphysema along the left lateral chest wall, stable versus mildly increased. Electronically Signed   By: Julian Hy M.D.   On: 09/28/2017 16:09   Dg Chest 2 View  Result Date: 09/25/2017 CLINICAL DATA:  Left chest tube EXAM: CHEST  2 VIEW COMPARISON:  09/23/2017 FINDINGS: Left chest tube remains in place.  Moderate-sized left pneumothorax and left base pneumothorax, stable. Extensive subcutaneous emphysema throughout the left chest wall, increased. Bibasilar atelectasis, left greater than right. IMPRESSION: Left chest tube remains in place with moderate-sized left pneumothorax, stable. Increasing subcutaneous emphysema throughout the left chest wall. Bibasilar atelectasis. Electronically Signed   By: Rolm Baptise M.D.   On: 09/25/2017 07:30   Dg Chest 2 View  Result Date: 09/17/2017 CLINICAL DATA:  Lung carcinoma.  Preoperative evaluation EXAM: CHEST  2 VIEW COMPARISON:  Chest CT August 17, 2017 and chest radiograph September 05, 2017 FINDINGS: Nodular opacity in the left upper lobe measures 1.6 x 0.8 cm by radiography. This opacity corresponds to the irregular nodular lesions seen on recent CT. There is no edema or consolidation. Heart size and pulmonary vascularity are normal. No adenopathy. There is aortic atherosclerosis. No evident bone lesions. There are surgical clips over the left upper hemithorax. IMPRESSION: Pulmonary nodular lesion seen on recent CT in the left upper lobe is appreciable by radiography. This areas concerning for neoplasm. No edema or consolidation. No adenopathy. There is aortic atherosclerosis. Aortic Atherosclerosis (ICD10-I70.0). Electronically Signed   By: Lowella Grip III M.D.   On: 09/17/2017 23:48   Ct Chest Wo Contrast  Result Date: 09/28/2017 CLINICAL DATA:  Status post left upper lobe lobectomy EXAM: CT CHEST WITHOUT CONTRAST TECHNIQUE: Multidetector CT imaging of the chest was performed following the standard protocol without IV contrast. COMPARISON:  Cereal radiographs 09/28/2017, dating back to 08/10/2017, previous CT chest 08/17/2017, 08/10/2017, 07/16/2017 FINDINGS: Cardiovascular: Limited evaluation without intravenous contrast. Moderate atherosclerotic calcifications of the aorta. Mild aneurysmal dilatation of the ascending aorta measuring up to 4.2 cm.  Coronary artery calcification. Normal heart size. Trace pericardial effusion. Mediastinum/Nodes: No thyroid mass. Esophagus within normal limits. 1 cm precarinal lymph node. 12 mm subcarinal lymph node. Pneumomediastinum is present with gas adjacent to the aorta, extending superiorly through the thoracic inlet. Lungs/Pleura: Moderate left hydropneumothorax. Patient is status post left upper lobectomy. Left lateral lower chest tube with scattered fluid or debris throughout the tube. Chest tube terminates along the left medial upper pleural space. Interstitial thickening throughout the left lower lobe, could be secondary to edema or infection. Multiple locules of gas and fluid in the posterior left lower lobe. There is emphysema in the right lung. There is a small right pleural effusion. Upper Abdomen: No acute finding Musculoskeletal: Old sternal and rib fractures. Extensive subcutaneous emphysema throughout the left chest wall anteriorly, laterally and posteriorly  extending to the upper abdomen. IMPRESSION: 1. Status post left upper lobe lobectomy. Moderate hydropneumothorax on the left side with most of the air collection at the left lung apex, similar to the comparison radiographs. A left lateral ascending chest tube is in place ; there appears to be fluid or debris scattered along the course of the tube, correlate for tube function 2. Gas and fluid collection in the left lower lobe appears different from preoperative CTs, could relate to fluid containing no pneumatoceles. There is diffuse interstitial density throughout the left lower lobe which may reflect edema or less likely infection. Small right pleural effusion and atelectasis at the right base. Mild to moderate emphysema in the right upper lobe. 3. Persistent extensive subcutaneous emphysema involving the left chest wall with pneumomediastinum present, query air leak. 4. Stable mild mediastinal lymph nodes. 5. Old rib and sternal fractures. Aortic  Atherosclerosis (ICD10-I70.0) and Emphysema (ICD10-J43.9). Electronically Signed   By: Donavan Foil M.D.   On: 09/28/2017 22:49   Dg Chest 1v Repeat Same Day  Result Date: 09/23/2017 CLINICAL DATA:  Status post left lobectomy. EXAM: CHEST - 1 VIEW SAME DAY COMPARISON:  Radiograph of same day. FINDINGS: Stable cardiomediastinal silhouette. Left-sided chest tube is unchanged in position. Moderate left apical and basilar pneumothorax is noted which is increased compared to prior exam. Increased subcutaneous emphysema seen over left lateral chest wall. Right lung is unremarkable. Right internal jugular catheter is unchanged. IMPRESSION: Stable position of left-sided chest tube. However moderate size left apical and basilar pneumothorax is noted which is increased in size compared to prior exam, with increased subcutaneous emphysema overlying left lateral chest wall. Electronically Signed   By: Marijo Conception, M.D.   On: 09/23/2017 13:08   Dg Chest Port 1 View  Result Date: 10/01/2017 CLINICAL DATA:  Follow-up left-sided hydropneumothorax. Recent left upper lobectomy. EXAM: PORTABLE CHEST 1 VIEW COMPARISON:  Portable chest x-ray of September 30, 2017 FINDINGS: There is a persistent left-sided hydropneumothorax. The chest tube is in stable position projecting over the posteromedial aspect of the left 6 rib. There is a moderate amount of subcutaneous emphysema on the left. There is no significant mediastinal shift. There is new patchy infiltrate in the right mid lung. The cardiac silhouette is not enlarged. The pulmonary vascularity is normal. There is calcification in the wall of the aortic arch. IMPRESSION: Stable appearance of the large left-sided hydropneumothorax. Persistent increased interstitial density in the mid and lower left lung. The chest tube is in stable position. New patchy airspace opacity in the right mid lung is worrisome for pneumonia. No CHF. Thoracic aortic atherosclerosis. Electronically  Signed   By: David  Martinique M.D.   On: 10/01/2017 07:31   Dg Chest Port 1 View  Result Date: 09/30/2017 CLINICAL DATA:  Hydropneumothorax. EXAM: PORTABLE CHEST 1 VIEW COMPARISON:  Chest x-ray from yesterday. FINDINGS: Left-sided chest tube remains in good position. Stable cardiomediastinal silhouette. Left-sided hydropneumothorax is relatively unchanged. Consolidation in the left mid lower lung is grossly similar. The right lung is relatively well aerated. Left chest wall subcutaneous emphysema again noted. IMPRESSION: Unchanged left hydropneumothorax and left lung consolidation. Electronically Signed   By: Titus Dubin M.D.   On: 09/30/2017 09:14   Portable Chest 1 View  Result Date: 09/29/2017 CLINICAL DATA:  Hydropneumothorax status post lobectomy of the lung. EXAM: PORTABLE CHEST 1 VIEW COMPARISON:  September 28, 2017 FINDINGS: The heart size and mediastinal contours are stable. Left hydropneumothorax is identified unchanged compared to prior exam.  Consolidation of the left mid and lung base are identified unchanged. Left chest tube is noted unchanged. Mild increased pulmonary interstitium is identified in the right lung. Left subcutaneous emphysema is identified unchanged. The visualized skeletal structures are stable. IMPRESSION: Left hydropneumothorax is unchanged compared to prior exam. Consolidation of the left mid and lung base is unchanged. Mild increased pulmonary interstitium of the right lung probably to development of mild interstitial edema. Electronically Signed   By: Abelardo Diesel M.D.   On: 09/29/2017 07:15   Dg Chest Port 1 View  Result Date: 09/26/2017 CLINICAL DATA:  Chest tube. EXAM: PORTABLE CHEST 1 VIEW COMPARISON:  09/25/2017. FINDINGS: Left chest tube in stable position. Stable moderate-sized pneumothorax. Slightly improved left chest wall subcutaneous emphysema. Persistent unchanged left base atelectasis. Heart size stable. IMPRESSION: 1. Left chest tube in stable  position. Stable moderate size left pneumothorax. Slightly improved left chest wall subcutaneous emphysema . 2. Persistent left base atelectasis. Electronically Signed   By: Marcello Moores  Register   On: 09/26/2017 07:49   Dg Chest Port 1 View  Result Date: 09/23/2017 CLINICAL DATA:  Pneumothorax follow-up EXAM: PORTABLE CHEST 1 VIEW COMPARISON:  Yesterday FINDINGS: Small to moderate left pneumothorax without interval change. No progression of chest wall gas when allowing for differences in projection. Stable left chest tube positioning with tip overlapping the left mediastinum. Right IJ line with tip at the SVC. Atelectasis at the bases. Chronic interstitial coarsening patient COPD. IMPRESSION: 1. Size stable left pneumothorax and chest tube position. 2. COPD and lower lobe atelectasis. Electronically Signed   By: Monte Fantasia M.D.   On: 09/23/2017 06:49   Dg Chest Port 1 View  Result Date: 09/22/2017 CLINICAL DATA:  Status post lobectomy EXAM: PORTABLE CHEST 1 VIEW COMPARISON:  Yesterday FINDINGS: Mild increase in left apical pneumothorax that is small to moderate. Small volume gas also seen at the left base where there is atelectasis. Mild atelectasis at the right base. Left chest tube in stable position, tip overlapping the left mediastinum. Right IJ line with tip at the SVC level. Normal heart size. IMPRESSION: 1. Mild increase in left pneumothorax. Increase in left chest wall gas. Chest tube positioning is stable. 2. Lower lobe atelectasis. Electronically Signed   By: Monte Fantasia M.D.   On: 09/22/2017 06:56   Dg Chest Port 1 View  Result Date: 09/21/2017 CLINICAL DATA:  Shortness of breath. EXAM: PORTABLE CHEST 1 VIEW COMPARISON:  Radiograph of September 20, 2017. FINDINGS: Stable cardiomediastinal silhouette. Atherosclerosis of thoracic aorta is noted. Right internal jugular catheter is unchanged in position. Left-sided chest tube is unchanged in position. Stable mild left apical pneumothorax.  Mild bibasilar subsegmental atelectasis is noted. Bony thorax is unremarkable. IMPRESSION: Stable position of left-sided chest tube with mild left apical pneumothorax. Mild bibasilar subsegmental atelectasis. Electronically Signed   By: Marijo Conception, M.D.   On: 09/21/2017 09:09   Dg Chest Port 1 View  Result Date: 09/20/2017 CLINICAL DATA:  Status post VATS, chest tube, pneumothorax EXAM: PORTABLE CHEST 1 VIEW COMPARISON:  09/19/2017 FINDINGS: Given slight changes in projection, there is a stable left pneumothorax, estimated at 15-20%. Slight retraction of the left chest tube. Bibasilar atelectasis persist. Slightly lower lung volumes compared to yesterday. Trachea is midline. Stable cardiomegaly with central vascular congestion. Right IJ central line tip at the lower SVC level. Atherosclerosis noted of the aorta. IMPRESSION: Lower lung volumes with increased basilar atelectasis. Stable left apical pneumothorax. Electronically Signed   By: Jerilynn Mages.  Shick M.D.  On: 09/20/2017 08:26   Dg Chest Port 1 View  Result Date: 09/19/2017 CLINICAL DATA:  Status post left upper lobectomy. EXAM: PORTABLE CHEST 1 VIEW COMPARISON:  Preop study September 17, 2017 FINDINGS: There is an approximately 15% the left-sided chest tube tip projects over the costovertebral angle of the left fourth rib. There is no pleural effusion. There is minimal atelectasis at the left lung base. The right lung is clear. The cardiac silhouette is mildly enlarged. The pulmonary vascularity is not engorged. There is calcification in the wall of the aortic arch. The right internal jugular venous catheter tip projects over the midportion of the SVC. Left apical pneumothorax IMPRESSION: There is a 15% left apical pneumothorax. A left chest tube lies medially at approximately the same level as the pleural line. There is no mediastinal shift. Mild cardiomegaly without significant pulmonary edema. Thoracic aortic atherosclerosis. These results will be  called to the ordering clinician or representative by the Radiologist Assistant, and communication documented in the PACS or zVision Dashboard. Electronically Signed   By: David  Martinique M.D.   On: 09/19/2017 13:19   Dg Chest Port 1 View  Result Date: 09/05/2017 CLINICAL DATA:  Status post bronchoscopy with biopsies today. Left upper lobe pulmonary nodule. EXAM: PORTABLE CHEST 1 VIEW COMPARISON:  CT chest 08/17/2017. FINDINGS: There is no pneumothorax after bronchoscopy. The lungs are emphysematous with basilar atelectasis or scar. Fiducial markers in the left upper lobe are noted. Heart size is normal. IMPRESSION: Negative for pneumothorax after bronchoscopy.  No acute abnormality. Electronically Signed   By: Inge Rise M.D.   On: 09/05/2017 11:07   Dg C-arm Bronchoscopy  Result Date: 09/05/2017 C-ARM BRONCHOSCOPY: Fluoroscopy was utilized by the requesting physician.  No radiographic interpretation.    Labs:  CBC: Recent Labs    09/28/17 1919 09/29/17 0346 09/30/17 0210 10/01/17 0247  WBC 22.2* 20.2* 19.8* 21.3*  HGB 9.7* 9.6* 9.6* 9.9*  HCT 29.2* 29.3* 28.6* 29.1*  PLT 466* 447* 515* 547*    COAGS: Recent Labs    08/01/17 1152 09/17/17 1331  INR 1.1* 0.99  APTT 28.7 30    BMP: Recent Labs    09/28/17 1919 09/29/17 0346 09/30/17 0619 10/01/17 0247  NA 131* 130* 132* 132*  K 3.8 3.5 3.4* 3.3*  CL 94* 95* 99* 98*  CO2 '24 23 24 22  ' GLUCOSE 128* 117* 129* 140*  BUN 22* '18 17 17  ' CALCIUM 8.1* 7.8* 7.8* 7.6*  CREATININE 1.59* 1.51* 1.53* 1.54*  GFRNONAA 41* 43* 42* 42*  GFRAA 47* 50* 49* 49*    LIVER FUNCTION TESTS: Recent Labs    09/17/17 1331 09/21/17 0416 09/28/17 1919  BILITOT 0.7 0.7 0.8  AST '24 20 22  ' ALT 19 15* 19  ALKPHOS 115 82 109  PROT 7.4 5.9* 6.2*  ALBUMIN 4.1 3.2* 2.5*    TUMOR MARKERS: No results for input(s): AFPTM, CEA, CA199, CHROMGRNA in the last 8760 hours.  Assessment and Plan: Hydropneumothorax Patient s/p recent VATS  with lobectomy.  Chest tube currently in place is clogged and non-functioning.  IR consulted for placement of new chest tube. Case reviewed and approved by Dr. Annamaria Boots.  Patient ate breakfast at 830AM.  NPO since that time.  Lovenox held.  Met with patient and wife to discuss procedure.  Risks and benefits discussed with the patient including, but not limited to bleeding, hemoptysis, respiratory failure requiring intubation, infection. All of the patient's questions were answered, patient is agreeable to proceed. Consent signed  and in chart.  Thank you for this interesting consult.  I greatly enjoyed meeting TRES GRZYWACZ and look forward to participating in their care.  A copy of this report was sent to the requesting provider on this date.  Electronically Signed: Docia Barrier, PA 10/01/2017, 11:50 AM   I spent a total of 40 Minutes    in face to face in clinical consultation, greater than 50% of which was counseling/coordinating care for hydropneumothorax.

## 2017-10-01 NOTE — Care Management Note (Signed)
Case Management Note Marvetta Gibbons RN, BSN Unit 4E-Case Manager-- [place] coverage [phone]  Patient Details  Name: Nathaniel Long MRN: [id number] Date of Birth: 09/14/1941  Subjective/Objective:   Pt with recent VATs with lobectomy-  Readmitted with increased pleural fluid, persistant air leak and need for IV abx and CT to suction                 Action/Plan: PTA pt lived at home with wife- on discharge was sent home with CT to [facility] with HH services arranged with [facility]- for [facility]-  CM to follow for transition needs   Expected Discharge Date:                  Expected Discharge Plan:  [place]  In-House Referral:  NA  Discharge planning Services  CM Consult  Post Acute Care Choice:  Home Health, Resumption of Svcs/PTA Provider Choice offered to:  Spouse  DME Arranged:    DME Agency:     HH Arranged:    [place] Agency:  [place]  Status of Service:  In process, will continue to follow  If discussed at Long Length of Stay Meetings, dates discussed:    Discharge Disposition:   Additional Comments:  Dawayne Patricia, RN 10/01/2017, 10:34 AM

## 2017-10-01 NOTE — Progress Notes (Signed)
Patient ID: Nathaniel Long, male   DOB: Dec 20, 1940, 76 y.o.   MRN: [id number] EVENING ROUNDS NOTE :     [address]       [place] [address]             [phone]                     Total Length of Stay:  LOS: 3 days  BP 90/67   Pulse 73   Temp 98.8 F (37.1 C) (Oral)   Resp 18   Ht 5\' 10"  (1.778 m)   Wt 192 lb 14.4 oz (87.5 kg)   SpO2 92%   BMI 27.68 kg/m   .Intake/Output      12/30 0701 - 12/31 0700 12/31 0701 - 01/01 0700   P.O. 840    IV Piggyback 450 150   Total Intake(mL/kg) 1290 (14.7) 150 (1.7)   Urine (mL/kg/hr) 1075 (0.5)    Stool     Chest Tube 0 0   Total Output 1075 0   Net +215 +150          . sodium chloride    . piperacillin-tazobactam (ZOSYN)  IV 3.375 g (10/01/17 1300)  . vancomycin Stopped (10/01/17 1423)     Lab Results  Component Value Date   WBC 21.3 (H) 10/01/2017   HGB 9.9 (L) 10/01/2017   HCT 29.1 (L) 10/01/2017   PLT 547 (H) 10/01/2017   GLUCOSE 140 (H) 10/01/2017   CHOL 129 02/21/2012   TRIG 129 02/21/2012   HDL 34 (L) 02/21/2012   LDLCALC 69 02/21/2012   ALT 19 09/28/2017   AST 22 09/28/2017   NA 132 (L) 10/01/2017   K 3.3 (L) 10/01/2017   CL 98 (L) 10/01/2017   CREATININE 1.54 (H) 10/01/2017   BUN 17 10/01/2017   CO2 22 10/01/2017   TSH 3.777 02/21/2012   INR 0.99 09/17/2017   HGBA1C 6.0 02/21/2012   Just back from placement of apical chest tube in CT, tolerated well, no air leak , minimal  drainage    Grace Isaac MD  Beeper [phone] Office [phone] 10/01/2017 4:54 PM

## 2017-10-01 NOTE — Progress Notes (Signed)
  Subjective: Frequent productive cough  Objective: Vital signs in last 24 hours: Temp:  [98.2 F (36.8 C)-99.5 F (37.5 C)] 98.2 F (36.8 C) (12/31 0734) Pulse Rate:  [70-76] 70 (12/31 0734) Cardiac Rhythm: Normal sinus rhythm (12/30 2000) Resp:  [18-28] 25 (12/31 0734) BP: (93-148)/(50-82) 101/67 (12/31 0734) SpO2:  [90 %-98 %] 95 % (12/31 0734)  Hemodynamic parameters for last 24 hours:    Intake/Output from previous day: 12/30 0701 - 12/31 0700 In: 1290 [P.O.:840; IV Piggyback:450] Out: 1075 [Urine:1075] Intake/Output this shift: No intake/output data recorded.  General appearance: alert, cooperative and no distress Neurologic: intact and hard of hearing Heart: regular rate and rhythm Lungs: diminished breath sounds on left Wound: clean and dry no air leak, CT nonfunctional  Lab Results: Recent Labs    09/30/17 0210 10/01/17 0247  WBC 19.8* 21.3*  HGB 9.6* 9.9*  HCT 28.6* 29.1*  PLT 515* 547*   BMET:  Recent Labs    09/30/17 0619 10/01/17 0247  NA 132* 132*  K 3.4* 3.3*  CL 99* 98*  CO2 24 22  GLUCOSE 129* 140*  BUN 17 17  CREATININE 1.53* 1.54*  CALCIUM 7.8* 7.6*    PT/INR: No results for input(s): LABPROT, INR in the last 72 hours. ABG    Component Value Date/Time   PHART 7.348 (L) 09/20/2017 0515   HCO3 21.8 09/20/2017 0515   ACIDBASEDEF 3.0 (H) 09/20/2017 0515   O2SAT 98.2 09/20/2017 0515   CBG (last 3)  No results for input(s): GLUCAP in the last 72 hours.  Assessment/Plan: S/P  -CV- stable  RESP- LLL pneumonia. He is on Vanco and Zosyn and afebrile. WBC still elevated.  No respiratory difficulty but still has frequent productive cough  Will dc chest tube as it is not functional  Will discuss with IR whether a pigtail can be placed in apical space  RENAL- creatinine stable. Treat hypokalemia  ENDO- CBG OK  Ambulating, will add enoxaparin for DVT prophylaxis   LOS: 3 days    Melrose Nakayama 10/01/2017

## 2017-10-01 NOTE — Procedures (Signed)
Left hydroptx  S/p CT left chest tube No comp Stable cx sent Full report in pacs Keep to LWS 20cm h20

## 2017-10-02 ENCOUNTER — Inpatient Hospital Stay (HOSPITAL_COMMUNITY): Payer: Medicare Other

## 2017-10-02 ENCOUNTER — Encounter (HOSPITAL_COMMUNITY): Payer: Self-pay | Admitting: *Deleted

## 2017-10-02 LAB — BASIC METABOLIC PANEL
Anion gap: 12 (ref 5–15)
BUN: 15 mg/dL (ref 6–20)
CO2: 23 mmol/L (ref 22–32)
CREATININE: 1.39 mg/dL — AB (ref 0.61–1.24)
Calcium: 7.8 mg/dL — ABNORMAL LOW (ref 8.9–10.3)
Chloride: 99 mmol/L — ABNORMAL LOW (ref 101–111)
GFR calc Af Amer: 55 mL/min — ABNORMAL LOW (ref >60)
GFR, EST NON AFRICAN AMERICAN: 48 mL/min — AB (ref >60)
Glucose, Bld: 120 mg/dL — ABNORMAL HIGH (ref 65–99)
POTASSIUM: 3.5 mmol/L (ref 3.5–5.1)
SODIUM: 134 mmol/L — AB (ref 135–145)

## 2017-10-02 LAB — CBC
HCT: 30.1 % — ABNORMAL LOW (ref 39.0–52.0)
Hemoglobin: 9.9 g/dL — ABNORMAL LOW (ref 13.0–17.0)
MCH: 29.9 pg (ref 26.0–34.0)
MCHC: 32.9 g/dL (ref 30.0–36.0)
MCV: 90.9 fL (ref 78.0–100.0)
PLATELETS: 653 10*3/uL — AB (ref 150–400)
RBC: 3.31 MIL/uL — AB (ref 4.22–5.81)
RDW: 15.7 % — ABNORMAL HIGH (ref 11.5–15.5)
WBC: 20.7 10*3/uL — AB (ref 4.0–10.5)

## 2017-10-02 LAB — GRAM STAIN

## 2017-10-02 MED ORDER — MUPIROCIN 2 % EX OINT
1.0000 "application " | TOPICAL_OINTMENT | Freq: Two times a day (BID) | CUTANEOUS | Status: DC
  Administered 2017-10-02 – 2017-10-06 (×8): 1 via NASAL
  Filled 2017-10-02 (×2): qty 22

## 2017-10-02 MED ORDER — CHLORHEXIDINE GLUCONATE CLOTH 2 % EX PADS
6.0000 | MEDICATED_PAD | Freq: Every day | CUTANEOUS | Status: DC
  Administered 2017-10-02 – 2017-10-04 (×3): 6 via TOPICAL

## 2017-10-02 NOTE — Progress Notes (Signed)
Pharmacy Antibiotic Note  Nathaniel Long is a 77 y.o. male admitted on 09/28/2017 with MRSA PNA/empyemia.  Pharmacy has been consulted for vancomycin dosing.  Vanc trough at goal (lab resulted as 14, last dose given early and lab drawn late >> true trough calculates closer to 16).  Plan: This patient's current antibiotics will be continued without adjustments.  Height: 5\' 10"  (177.8 cm) Weight: 192 lb 14.4 oz (87.5 kg) IBW/kg (Calculated) : 73  Temp (24hrs), Avg:98.4 F (36.9 C), Min:97.5 F (36.4 C), Max:99.4 F (37.4 C)  Recent Labs  Lab 09/28/17 1919 09/29/17 0346 09/30/17 0210 09/30/17 0619 10/01/17 0247 10/01/17 2211  WBC 22.2* 20.2* 19.8*  --  21.3*  --   CREATININE 1.59* 1.51*  --  1.53* 1.54*  --   VANCOTROUGH  --   --   --   --   --  14*    Estimated Creatinine Clearance: 42.1 mL/min (A) (by C-G formula based on SCr of 1.54 mg/dL (H)).    Allergies  Allergen Reactions  . Gabapentin Other (See Comments)    dizziness  . Statins Other (See Comments)    Muscle weakness     Thank you for allowing pharmacy to be a part of this patient's care.  Wynona Neat, PharmD, BCPS  10/02/2017 1:46 AM

## 2017-10-02 NOTE — Plan of Care (Signed)
Pt. Continues to diurese w/o the assistance of medications and is excreting at least 73ml per hour.

## 2017-10-02 NOTE — Progress Notes (Signed)
Previous notes indicate patient has MRSA in CT drainage.  Patient placed on contact precautions.  Already being treated with Zosyn and Vanc.  Patient and wife educated.

## 2017-10-02 NOTE — Progress Notes (Signed)
Patient ID: Nathaniel Long, male   DOB: Apr 25, 1941, 77 y.o.   MRN: [id number] EVENING ROUNDS NOTE :     [address]       [place] [address]             [phone]                     Total Length of Stay:  LOS: 4 days  BP 110/75   Pulse 68   Temp 97.6 F (36.4 C) (Oral)   Resp (!) 21   Ht 5\' 10"  (1.778 m)   Wt 192 lb 14.4 oz (87.5 kg)   SpO2 93%   BMI 27.68 kg/m   .Intake/Output      12/31 0701 - 01/01 0700 01/01 0701 - 01/02 0700   P.O.  80   IV Piggyback 400    Total Intake(mL/kg) 400 (4.6) 80 (0.9)   Urine (mL/kg/hr) 900 (0.4)    Chest Tube 120    Total Output 1020    Net -620 +80          . sodium chloride    . piperacillin-tazobactam (ZOSYN)  IV 3.375 g (10/02/17 1500)  . vancomycin Stopped (10/02/17 1219)     Lab Results  Component Value Date   WBC 20.7 (H) 10/02/2017   HGB 9.9 (L) 10/02/2017   HCT 30.1 (L) 10/02/2017   PLT 653 (H) 10/02/2017   GLUCOSE 120 (H) 10/02/2017   CHOL 129 02/21/2012   TRIG 129 02/21/2012   HDL 34 (L) 02/21/2012   LDLCALC 69 02/21/2012   ALT 19 09/28/2017   AST 22 09/28/2017   NA 134 (L) 10/02/2017   K 3.5 10/02/2017   CL 99 (L) 10/02/2017   CREATININE 1.39 (H) 10/02/2017   BUN 15 10/02/2017   CO2 23 10/02/2017   TSH 3.777 02/21/2012   INR 0.99 09/17/2017   HGBA1C 6.0 02/21/2012   Stable day, follow up chest xray in am   Grace Isaac MD  Beeper [phone] Office [phone] 10/02/2017 5:31 PM

## 2017-10-02 NOTE — Progress Notes (Addendum)
Patient ID: Nathaniel Long, male   DOB: 05-09-1941, 77 y.o.   MRN: [id number] [facility] DAILY ICU PROGRESS NOTE                   [address]            [place] [address]          [phone]       Total Length of Stay:  LOS: 4 days   Subjective: Patient says he feels better, breathing better  Objective: Vital signs in last 24 hours: Temp:  [97.5 F (36.4 C)-98.8 F (37.1 C)] 98.1 F (36.7 C) (01/01 0736) Pulse Rate:  [69-81] 69 (01/01 0736) Cardiac Rhythm: Normal sinus rhythm (01/01 0400) Resp:  [18-30] 23 (01/01 0736) BP: (78-106)/(58-76) 95/64 (01/01 0736) SpO2:  [92 %-97 %] 95 % (01/01 0736)  Filed Weights   09/28/17 1755 09/28/17 1806 09/30/17 0610  Weight: 192 lb 0.3 oz (87.1 kg) 192 lb 0.3 oz (87.1 kg) 192 lb 14.4 oz (87.5 kg)    Weight change:    Hemodynamic parameters for last 24 hours:    Intake/Output from previous day: 12/31 0701 - 01/01 0700 In: 400 [IV Piggyback:400] Out: 870 [Urine:750; Chest Tube:120]  Intake/Output this shift: No intake/output data recorded.  Current Meds: Scheduled Meds: . amLODipine  5 mg Oral Daily  . aspirin EC  81 mg Oral Daily  . docusate sodium  100 mg Oral BID  . enoxaparin (LOVENOX) injection  30 mg Subcutaneous Q24H  . levalbuterol  0.63 mg Nebulization TID  . loratadine  10 mg Oral Daily  . mouth rinse  15 mL Mouth Rinse BID  . pantoprazole  40 mg Oral QHS  . sodium chloride flush  3 mL Intravenous Q12H  . sodium chloride flush  3 mL Intravenous Q12H  . umeclidinium-vilanterol  1 puff Inhalation Daily   Continuous Infusions: . sodium chloride    . piperacillin-tazobactam (ZOSYN)  IV 3.375 g (10/02/17 0431)  . vancomycin 750 mg (10/01/17 2300)   PRN Meds:.sodium chloride, acetaminophen **OR** acetaminophen, ALPRAZolam, bisacodyl, calcium carbonate, magnesium hydroxide, ondansetron **OR** ondansetron (ZOFRAN) IV, polyethylene glycol, potassium chloride, sodium chloride, sodium chloride flush,  traMADol  General appearance: alert and appears stated age Neurologic: intact Heart: regular rate and rhythm, S1, S2 normal, no murmur, click, rub or gallop Lungs: diminished breath sounds LLL and LUL Abdomen: soft, non-tender; bowel sounds normal; no masses,  no organomegaly Extremities: extremities normal, atraumatic, no cyanosis or edema and Homans sign is negative, no sign of DVT Wound: no air leak from chest tube   Lab Results: CBC: Recent Labs    10/01/17 0247 10/02/17 0327  WBC 21.3* 20.7*  HGB 9.9* 9.9*  HCT 29.1* 30.1*  PLT 547* 653*   BMET:  Recent Labs    10/01/17 0247 10/02/17 0327  NA 132* 134*  K 3.3* 3.5  CL 98* 99*  CO2 22 23  GLUCOSE 140* 120*  BUN 17 15  CREATININE 1.54* 1.39*  CALCIUM 7.6* 7.8*    CMET: Lab Results  Component Value Date   WBC 20.7 (H) 10/02/2017   HGB 9.9 (L) 10/02/2017   HCT 30.1 (L) 10/02/2017   PLT 653 (H) 10/02/2017   GLUCOSE 120 (H) 10/02/2017   CHOL 129 02/21/2012   TRIG 129 02/21/2012   HDL 34 (L) 02/21/2012   LDLCALC 69 02/21/2012   ALT 19 09/28/2017   AST 22 09/28/2017   NA 134 (L) 10/02/2017  K 3.5 10/02/2017   CL 99 (L) 10/02/2017   CREATININE 1.39 (H) 10/02/2017   BUN 15 10/02/2017   CO2 23 10/02/2017   TSH 3.777 02/21/2012   INR 0.99 09/17/2017   HGBA1C 6.0 02/21/2012      PT/INR: No results for input(s): LABPROT, INR in the last 72 hours. Radiology: Dg Chest Port 1 View  Result Date: 10/02/2017 CLINICAL DATA:  Follow-up chest tube EXAM: PORTABLE CHEST 1 VIEW COMPARISON:  10/01/2017 FINDINGS: Cardiac shadow is enlarged. New pigtail catheter is noted in the left apex. The previously seen hydropneumothorax has reduced in the interval from the prior exam. Some persistent likely loculated fluid is noted inferiorly. The previously seen chest tube has been removed. The right lung is clear. Stable infiltrate is noted in the mid and lower lobes on the left. IMPRESSION: New left chest tube with reduction in the  left hydropneumothorax. A likely loculated inferior pleural effusion is noted and stable. Stable left infiltrate Electronically Signed   By: Inez Catalina M.D.   On: 10/02/2017 07:04   Ct Image Guided Drainage By Percutaneous Catheter  Result Date: 10/01/2017 INDICATION: Loculated left hydropneumothorax EXAM: CT LEFT CHEST TUBE INSERTION MEDICATIONS: 1% LIDOCAINE LOCAL ANESTHESIA/SEDATION: Fentanyl 50 mcg IV; Versed 0 mg IV Moderate Sedation Time:  None. The patient was continuously monitored during the procedure by the interventional radiology nurse under my direct supervision. COMPLICATIONS: None immediate. PROCEDURE: Informed written consent was obtained from the patient after a thorough discussion of the procedural risks, benefits and alternatives. All questions were addressed. Maximal Sterile Barrier Technique was utilized including caps, mask, sterile gowns, sterile gloves, sterile drape, hand hygiene and skin antiseptic. A timeout was performed prior to the initiation of the procedure. Previous imaging reviewed. Patient positioned supine. Noncontrast localization CT performed. The apical loculated left hydropneumothorax was localized. Overlying skin marked for an anterior approach through the second- third anterior interspace. Under sterile conditions and local anesthesia, an 18 gauge introducer needle was advanced from an anterior intercostal approach. Needle position confirmed with CT. Guidewire inserted followed by tract dilatation inserted 14 French drain. Drain catheter position confirmed with CT. Syringe aspiration yielded mildly exudative serosanguineous fluid. Sample sent for culture. Catheter secured with a Prolene suture and connected to external Pleur-Evac. Sterile dressing applied. No immediate complication. Patient tolerated the procedure well. IMPRESSION: Successful CT-guided anterior left chest tube insertion for a residual apical loculated hydropneumothorax. Electronically Signed   By: Jerilynn Mages.   Shick M.D.   On: 10/01/2017 17:30   Chronic Kidney Disease   Stage I     GFR >90  Stage II    GFR 60-89  Stage IIIA GFR 45-59  Stage IIIB GFR 30-44  Stage IV   GFR 15-29  Stage V    GFR  <15  Lab Results  Component Value Date   CREATININE 1.39 (H) 10/02/2017   Estimated Creatinine Clearance: 46.7 mL/min (A) (by C-G formula based on SCr of 1.39 mg/dL (H)).  Assessment/Plan: Mobilize Plan for transfer to step-down: see transfer orders still waiting 4 days for step down bed  Apical space decreased in size, still infiltrative process left lung  Wbc still elevated , cr improved  METHICILLIN RESISTANT STAPHYLOCOCCUS AUREUS -growing from chest tube drainage on admission, culture from yesterday is pending  Grace Isaac 10/02/2017 7:39 AM

## 2017-10-03 ENCOUNTER — Encounter (HOSPITAL_COMMUNITY): Payer: Self-pay

## 2017-10-03 ENCOUNTER — Inpatient Hospital Stay (HOSPITAL_COMMUNITY): Payer: Medicare Other

## 2017-10-03 LAB — CULTURE, BLOOD (ROUTINE X 2)
CULTURE: NO GROWTH
Culture: NO GROWTH
SPECIAL REQUESTS: ADEQUATE
Special Requests: ADEQUATE

## 2017-10-03 LAB — AEROBIC/ANAEROBIC CULTURE (SURGICAL/DEEP WOUND)

## 2017-10-03 LAB — CULTURE, RESPIRATORY
CULTURE: NORMAL
SPECIAL REQUESTS: NORMAL

## 2017-10-03 LAB — BASIC METABOLIC PANEL
Anion gap: 8 (ref 5–15)
BUN: 17 mg/dL (ref 6–20)
CO2: 21 mmol/L — ABNORMAL LOW (ref 22–32)
Calcium: 7.7 mg/dL — ABNORMAL LOW (ref 8.9–10.3)
Chloride: 105 mmol/L (ref 101–111)
Creatinine, Ser: 1.37 mg/dL — ABNORMAL HIGH (ref 0.61–1.24)
GFR calc Af Amer: 56 mL/min — ABNORMAL LOW (ref >60)
GFR calc non Af Amer: 49 mL/min — ABNORMAL LOW (ref >60)
Glucose, Bld: 106 mg/dL — ABNORMAL HIGH (ref 65–99)
Potassium: 3.7 mmol/L (ref 3.5–5.1)
Sodium: 134 mmol/L — ABNORMAL LOW (ref 135–145)

## 2017-10-03 LAB — CBC
HCT: 29.6 % — ABNORMAL LOW (ref 39.0–52.0)
Hemoglobin: 9.7 g/dL — ABNORMAL LOW (ref 13.0–17.0)
MCH: 30.1 pg (ref 26.0–34.0)
MCHC: 32.8 g/dL (ref 30.0–36.0)
MCV: 91.9 fL (ref 78.0–100.0)
Platelets: 730 10*3/uL — ABNORMAL HIGH (ref 150–400)
RBC: 3.22 MIL/uL — ABNORMAL LOW (ref 4.22–5.81)
RDW: 15.9 % — ABNORMAL HIGH (ref 11.5–15.5)
WBC: 16.8 10*3/uL — ABNORMAL HIGH (ref 4.0–10.5)

## 2017-10-03 LAB — CULTURE, RESPIRATORY W GRAM STAIN

## 2017-10-03 LAB — FUNGUS CULTURE WITH STAIN

## 2017-10-03 LAB — AEROBIC/ANAEROBIC CULTURE W GRAM STAIN (SURGICAL/DEEP WOUND)

## 2017-10-03 LAB — FUNGUS CULTURE RESULT

## 2017-10-03 LAB — FUNGAL ORGANISM REFLEX

## 2017-10-03 MED ORDER — POLYETHYLENE GLYCOL 3350 17 G PO PACK: 17.0000 g | PACK | Freq: Every day | ORAL | Status: DC | PRN

## 2017-10-03 NOTE — Progress Notes (Signed)
  Subjective: No complaints this AM  Objective: Vital signs in last 24 hours: Temp:  [97.6 F (36.4 C)-98.4 F (36.9 C)] 98.4 F (36.9 C) (01/02 0730) Pulse Rate:  [66-68] 68 (01/01 1437) Cardiac Rhythm: Normal sinus rhythm (01/02 0527) Resp:  [19-24] 19 (01/02 0400) BP: (81-110)/(59-75) 90/64 (01/02 0400) SpO2:  [90 %-96 %] 95 % (01/02 0400)  Hemodynamic parameters for last 24 hours:    Intake/Output from previous day: 01/01 0701 - 01/02 0700 In: 1350 [P.O.:780; IV Piggyback:570] Out: 1300 [Urine:1200; Chest Tube:100] Intake/Output this shift: No intake/output data recorded.  General appearance: alert and cooperative Neurologic: intact Heart: regular rate and rhythm Lungs: diminished breath sounds left base Wound: erythema at CT site, some serous drainage  Lab Results: Recent Labs    10/02/17 0327 10/03/17 0203  WBC 20.7* 16.8*  HGB 9.9* 9.7*  HCT 30.1* 29.6*  PLT 653* 730*   BMET:  Recent Labs    10/02/17 0327 10/03/17 0203  NA 134* 134*  K 3.5 3.7  CL 99* 105  CO2 23 21*  GLUCOSE 120* 106*  BUN 15 17  CREATININE 1.39* 1.37*  CALCIUM 7.8* 7.7*    PT/INR: No results for input(s): LABPROT, INR in the last 72 hours. ABG    Component Value Date/Time   PHART 7.348 (L) 09/20/2017 0515   HCO3 21.8 09/20/2017 0515   ACIDBASEDEF 3.0 (H) 09/20/2017 0515   O2SAT 98.2 09/20/2017 0515   CBG (last 3)  No results for input(s): GLUCAP in the last 72 hours.  Assessment/Plan: S/P  Plan for transfer to step-down: see transfer orders  Still awaiting a step down bed Postop hydropneumothorax after Left upper lobectomy. Left lower lobe pneumonia. CXR stable from yesterday- decreased apical space, still has some pleural thickening/ fluid and LLL consolidation. No air leak and fluid from tube looks serous Productive cough improving Growing MRSA from CT site Cultures from pleural space NG to date Continue Vanco and Zosyn - day 5 for MRSA at CT site and LLL  pneumonia, possible lung abscess SCD + enoxaparin Creatinine improved Anemia secondary to ABL- stable  LOS: 5 days    Melrose Nakayama 10/03/2017

## 2017-10-03 NOTE — Progress Notes (Signed)
Referring Physician(s): Dr Leonarda Salon  Supervising Physician: Sandi Mariscal  Patient Status:  [facility] - In-pt  Chief Complaint:  Left hydropneumothorax  Subjective: 12/31 procedure: IMPRESSION: Successful CT-guided anterior left chest tube insertion for a residual apical loculated hydropneumothorax.  CXR this am 10/03/17 IMPRESSION: Overall no significant change. Pigtail catheter tip left lung apex. Tiny left apical pneumothorax. Loculated left-sided pleural effusion. Left chest wall subcutaneous emphysema. Parenchymal changes throughout the left lung greatest perihilar and lower lobe unchanged.  In NAD No complaints Resting up in bed HOH  Allergies: Gabapentin and Statins  Medications: Prior to Admission medications   Medication Sig Start Date End Date Taking? Authorizing Provider  acetaminophen (TYLENOL) 325 MG tablet Take 1 tablet (325 mg total) by mouth every 6 (six) hours as needed for moderate pain or headache. 09/05/17  Yes Collene Gobble, MD  ALPRAZolam Duanne Moron) 0.5 MG tablet Take 1 tablet (0.5 mg total) by mouth at bedtime as needed for anxiety. 09/26/17  Yes Gold, Wayne E, PA-C  amLODipine (NORVASC) 5 MG tablet Take 5 mg daily by mouth. 08/09/17  Yes [provider]  aspirin EC 81 MG tablet Take 1 tablet (81 mg total) by mouth daily. 07/20/17 07/20/18 Yes Simaan, Darci Current, PA-C  calcium carbonate (TUMS - DOSED IN MG ELEMENTAL CALCIUM) 500 MG chewable tablet Chew 1 tablet by mouth daily as needed for indigestion or heartburn.   Yes [provider]  cetirizine (ZYRTEC) 10 MG tablet TAKE 1 TABLET BY MOUTH AT BEDTIME Patient taking differently: Take 10 mg daily as needed for allergies 06/16/11  Yes Lyndal Pulley, DO  Cholecalciferol (VITAMIN D) 2000 units CAPS Take 4,000 Units by mouth every evening.   Yes [provider]  clotrimazole (LOTRIMIN) 1 % cream Apply 1 application topically 2 (two) times daily.   Yes [provider]  fish oil-omega-3 fatty acids 1000 MG capsule Take 1 g by mouth every evening.    Yes [provider]  Multiple Vitamins-Minerals (CENTRUM PO) Take 1 tablet by mouth every evening.    Yes [provider]  oxyCODONE (OXY IR/ROXICODONE) 5 MG immediate release tablet Take 1 tablet (5 mg total) by mouth every 6 (six) hours as needed for moderate pain. 09/26/17  Yes Gold, Wayne E, PA-C  pantoprazole (PROTONIX) 40 MG tablet Take 40 mg by mouth at bedtime.    Yes [provider]  Pitavastatin Calcium (LIVALO) 4 MG TABS Take 2 mg by mouth every evening.    Yes [provider]  polyethylene glycol (MIRALAX / GLYCOLAX) packet Take 17 g by mouth daily as needed for mild constipation.   Yes [provider]  sodium chloride (OCEAN) 0.65 % SOLN nasal spray Place 1 spray into both nostrils as needed for congestion.   Yes [provider]  umeclidinium-vilanterol (ANORO ELLIPTA) 62.5-25 MCG/INH AEPB Inhale 1 puff daily into the lungs. 08/13/17  Yes Mannam, Praveen, MD  albuterol (PROVENTIL) (2.5 MG/3ML) 0.083% nebulizer solution Take 3 mLs (2.5 mg total) by nebulization every 6 (six) hours as needed for wheezing or shortness of breath. Dx:C34.12 09/28/17   Marshell Garfinkel, MD     Vital Signs: BP 98/68   Pulse 68   Temp 98.2 F (36.8 C) (Oral)   Resp (!) 27   Ht 5\' 10"  (1.778 m)   Wt 192 lb 14.4 oz (87.5 kg)   SpO2 93%   BMI 27.68 kg/m   Physical Exam  Constitutional: He is oriented to person, place,  and time.  Pulmonary/Chest: Effort normal and breath sounds normal.  Abdominal: Soft.  Musculoskeletal: Normal range of motion.  Neurological: He is alert and oriented to person, place, and time.  Skin: Skin is warm and dry.  Site is clean and dry NT no bleeding No air leak 300 cc serous fluid in pleurvac   Nursing note and vitals reviewed.   Imaging: Dg Chest Port 1 View  Result Date: 10/03/2017 CLINICAL DATA:  77 year old male with chest  tube in place. Shortness breath. Insert sub EXAM: PORTABLE CHEST 1 VIEW COMPARISON:  10/02/2017 FINDINGS: Pigtail catheter tip left lung apex. Tiny left apical pneumothorax. Loculated left-sided pleural effusion. Left chest wall subcutaneous emphysema. Parenchymal changes throughout the left lung greatest perihilar and lower lobe unchanged. Cardiomegaly.  Calcified aorta. No acute osseous abnormality. IMPRESSION: Overall no significant change. Pigtail catheter tip left lung apex. Tiny left apical pneumothorax. Loculated left-sided pleural effusion. Left chest wall subcutaneous emphysema. Parenchymal changes throughout the left lung greatest perihilar and lower lobe unchanged. Electronically Signed   By: Genia Del M.D.   On: 10/03/2017 07:24   Dg Chest Port 1 View  Result Date: 10/02/2017 CLINICAL DATA:  Follow-up chest tube EXAM: PORTABLE CHEST 1 VIEW COMPARISON:  10/01/2017 FINDINGS: Cardiac shadow is enlarged. New pigtail catheter is noted in the left apex. The previously seen hydropneumothorax has reduced in the interval from the prior exam. Some persistent likely loculated fluid is noted inferiorly. The previously seen chest tube has been removed. The right lung is clear. Stable infiltrate is noted in the mid and lower lobes on the left. IMPRESSION: New left chest tube with reduction in the left hydropneumothorax. A likely loculated inferior pleural effusion is noted and stable. Stable left infiltrate Electronically Signed   By: Inez Catalina M.D.   On: 10/02/2017 07:04   Dg Chest Port 1 View  Result Date: 10/01/2017 CLINICAL DATA:  Follow-up left-sided hydropneumothorax. Recent left upper lobectomy. EXAM: PORTABLE CHEST 1 VIEW COMPARISON:  Portable chest x-ray of September 30, 2017 FINDINGS: There is a persistent left-sided hydropneumothorax. The chest tube is in stable position projecting over the posteromedial aspect of the left 6 rib. There is a moderate amount of subcutaneous emphysema on the  left. There is no significant mediastinal shift. There is new patchy infiltrate in the right mid lung. The cardiac silhouette is not enlarged. The pulmonary vascularity is normal. There is calcification in the wall of the aortic arch. IMPRESSION: Stable appearance of the large left-sided hydropneumothorax. Persistent increased interstitial density in the mid and lower left lung. The chest tube is in stable position. New patchy airspace opacity in the right mid lung is worrisome for pneumonia. No CHF. Thoracic aortic atherosclerosis. Electronically Signed   By: David  Martinique M.D.   On: 10/01/2017 07:31   Dg Chest Port 1 View  Result Date: 09/30/2017 CLINICAL DATA:  Hydropneumothorax. EXAM: PORTABLE CHEST 1 VIEW COMPARISON:  Chest x-ray from yesterday. FINDINGS: Left-sided chest tube remains in good position. Stable cardiomediastinal silhouette. Left-sided hydropneumothorax is relatively unchanged. Consolidation in the left mid lower lung is grossly similar. The right lung is relatively well aerated. Left chest wall subcutaneous emphysema again noted. IMPRESSION: Unchanged left hydropneumothorax and left lung consolidation. Electronically Signed   By: Titus Dubin M.D.   On: 09/30/2017 09:14   Ct Image Guided Drainage By Percutaneous Catheter  Result Date: 10/01/2017 INDICATION: Loculated left hydropneumothorax EXAM: CT LEFT CHEST TUBE INSERTION MEDICATIONS: 1% LIDOCAINE LOCAL ANESTHESIA/SEDATION: Fentanyl 50 mcg IV; Versed 0  mg IV Moderate Sedation Time:  None. The patient was continuously monitored during the procedure by the interventional radiology nurse under my direct supervision. COMPLICATIONS: None immediate. PROCEDURE: Informed written consent was obtained from the patient after a thorough discussion of the procedural risks, benefits and alternatives. All questions were addressed. Maximal Sterile Barrier Technique was utilized including caps, mask, sterile gowns, sterile gloves, sterile drape,  hand hygiene and skin antiseptic. A timeout was performed prior to the initiation of the procedure. Previous imaging reviewed. Patient positioned supine. Noncontrast localization CT performed. The apical loculated left hydropneumothorax was localized. Overlying skin marked for an anterior approach through the second- third anterior interspace. Under sterile conditions and local anesthesia, an 18 gauge introducer needle was advanced from an anterior intercostal approach. Needle position confirmed with CT. Guidewire inserted followed by tract dilatation inserted 14 French drain. Drain catheter position confirmed with CT. Syringe aspiration yielded mildly exudative serosanguineous fluid. Sample sent for culture. Catheter secured with a Prolene suture and connected to external Pleur-Evac. Sterile dressing applied. No immediate complication. Patient tolerated the procedure well. IMPRESSION: Successful CT-guided anterior left chest tube insertion for a residual apical loculated hydropneumothorax. Electronically Signed   By: Jerilynn Mages.  Shick M.D.   On: 10/01/2017 17:30    Labs:  CBC: Recent Labs    09/30/17 0210 10/01/17 0247 10/02/17 0327 10/03/17 0203  WBC 19.8* 21.3* 20.7* 16.8*  HGB 9.6* 9.9* 9.9* 9.7*  HCT 28.6* 29.1* 30.1* 29.6*  PLT 515* 547* 653* 730*    COAGS: Recent Labs    08/01/17 1152 09/17/17 1331  INR 1.1* 0.99  APTT 28.7 30    BMP: Recent Labs    09/30/17 0619 10/01/17 0247 10/02/17 0327 10/03/17 0203  NA 132* 132* 134* 134*  K 3.4* 3.3* 3.5 3.7  CL 99* 98* 99* 105  CO2 24 22 23  21*  GLUCOSE 129* 140* 120* 106*  BUN 17 17 15 17   CALCIUM 7.8* 7.6* 7.8* 7.7*  CREATININE 1.53* 1.54* 1.39* 1.37*  GFRNONAA 42* 42* 48* 49*  GFRAA 49* 49* 55* 56*    LIVER FUNCTION TESTS: Recent Labs    09/17/17 1331 09/21/17 0416 09/28/17 1919  BILITOT 0.7 0.7 0.8  AST 24 20 22   ALT 19 15* 19  ALKPHOS 115 82 109  PROT 7.4 5.9* 6.2*  ALBUMIN 4.1 3.2* 2.5*    Assessment and  Plan:  Left hydropneumothorax L chest tube drain placed 12/31 Doing well; draining well Plan per Dr Roxan Hockey  Electronically Signed: Monia Sabal A, PA-C 10/03/2017, 2:26 PM   I spent a total of 15 Minutes at the the patient's bedside AND on the patient's hospital floor or unit, greater than 50% of which was counseling/coordinating care for left chest tube drain

## 2017-10-03 NOTE — Progress Notes (Signed)
Chart reviewed for LOS; [name] [phone]

## 2017-10-04 ENCOUNTER — Inpatient Hospital Stay (HOSPITAL_COMMUNITY): Payer: Medicare Other

## 2017-10-04 LAB — BASIC METABOLIC PANEL
ANION GAP: 6 (ref 5–15)
BUN: 14 mg/dL (ref 6–20)
CALCIUM: 7.3 mg/dL — AB (ref 8.9–10.3)
CO2: 21 mmol/L — AB (ref 22–32)
Chloride: 107 mmol/L (ref 101–111)
Creatinine, Ser: 1.42 mg/dL — ABNORMAL HIGH (ref 0.61–1.24)
GFR, EST AFRICAN AMERICAN: 54 mL/min — AB (ref >60)
GFR, EST NON AFRICAN AMERICAN: 46 mL/min — AB (ref >60)
Glucose, Bld: 114 mg/dL — ABNORMAL HIGH (ref 65–99)
Potassium: 3.5 mmol/L (ref 3.5–5.1)
Sodium: 134 mmol/L — ABNORMAL LOW (ref 135–145)

## 2017-10-04 LAB — CBC
HCT: 29.6 % — ABNORMAL LOW (ref 39.0–52.0)
HEMOGLOBIN: 9.4 g/dL — AB (ref 13.0–17.0)
MCH: 28.5 pg (ref 26.0–34.0)
MCHC: 31.8 g/dL (ref 30.0–36.0)
MCV: 89.7 fL (ref 78.0–100.0)
Platelets: 729 10*3/uL — ABNORMAL HIGH (ref 150–400)
RBC: 3.3 MIL/uL — AB (ref 4.22–5.81)
RDW: 15.4 % (ref 11.5–15.5)
WBC: 16.2 10*3/uL — ABNORMAL HIGH (ref 4.0–10.5)

## 2017-10-04 MED ORDER — LEVALBUTEROL HCL 0.63 MG/3ML IN NEBU
0.6300 mg | INHALATION_SOLUTION | Freq: Two times a day (BID) | RESPIRATORY_TRACT | Status: DC
  Administered 2017-10-05 – 2017-10-06 (×3): 0.63 mg via RESPIRATORY_TRACT
  Filled 2017-10-04 (×4): qty 3

## 2017-10-04 NOTE — Progress Notes (Signed)
Recent VATS with lobectomy, readmitted with post op hydropneumothorax and pna and possible lung abscess.  conts with chest tube to suction, MRSA growing from chest tube site. Transferred to SDU, conts on iv vanc/zosyn.  Home with wife pta.

## 2017-10-04 NOTE — Progress Notes (Signed)
Chart and CXR reviewed. Dr. Roxan Hockey requests removal of (L)chest tube. Scant drainage and no air leak.  CT removed at bedside without difficulty. Occlusive dressing applied, leave intact x 24 hrs. CXR ordered.  Ascencion Dike PA-C Interventional Radiology 10/04/2017 3:06 PM

## 2017-10-04 NOTE — Progress Notes (Signed)
Pharmacy Antibiotic Note  Nathaniel Long is a 77 y.o. male admitted with MRSA PNA/empyemia (Patient underwent L VAT with mediastinal lymph node dissection on 09/19/2017). Pharmacy has been consulted for vancomycin and zosyn dosing. -WBC= 16.2, afebrile, SCr= 1.42 and CrCl ~ 45 mL/min   Plan: -Vancomycin 750mg  q12  -Paged MD yesterday to stop Zosyn and he wants to continue -Will follow renal function, cultures and clinical progress   Height: 5\' 10"  (177.8 cm) Weight: 192 lb 14.4 oz (87.5 kg) IBW/kg (Calculated) : 73  Temp (24hrs), Avg:98.2 F (36.8 C), Min:97.5 F (36.4 C), Max:99.3 F (37.4 C)  Recent Labs  Lab 09/30/17 0210 09/30/17 0619 10/01/17 0247 10/01/17 2211 10/02/17 0327 10/03/17 0203 10/04/17 0253  WBC 19.8*  --  21.3*  --  20.7* 16.8* 16.2*  CREATININE  --  1.53* 1.54*  --  1.39* 1.37* 1.42*  VANCOTROUGH  --   --   --  14*  --   --   --     Estimated Creatinine Clearance: 45.7 mL/min (A) (by C-G formula based on SCr of 1.42 mg/dL (H)).    Allergies  Allergen Reactions  . Gabapentin Other (See Comments)    dizziness  . Statins Other (See Comments)    Muscle weakness    Antimicrobials this admission: Vancomycin 12/28 >>  Zosyn 12/28 >>   Dose adjustments this admission: N/A  Microbiology results: 12/30 resp- pending 12/28 Chest Drainage: MRSA 12/28BCx: NGTD  Thank you for allowing pharmacy to be a part of this patient's care.  Georga Bora, PharmD Clinical Pharmacist 10/04/2017 1:31 PM

## 2017-10-04 NOTE — Progress Notes (Signed)
[facility]  [facility] is following pt for pending DC home. [facility] will provide [facility] services as well as [facility] services for home IV ABX if needed/ordered at DC.  Pt now deemed HRI with [facility].   If patient discharges after hours, please call [phone].   Larry Sierras 10/04/2017, 9:20 AM

## 2017-10-04 NOTE — Progress Notes (Addendum)
      [address]       ,[place] [address]             [phone]      Subjective:  Patient up in bed watching program on computer.  States he is feeling better.  Denies shortness of breath.  Objective: Vital signs in last 24 hours: Temp:  [97.5 F (36.4 C)-99.3 F (37.4 C)] 98.5 F (36.9 C) (01/03 0438) Pulse Rate:  [70-80] 71 (01/03 0438) Cardiac Rhythm: Normal sinus rhythm (01/03 0438) Resp:  [23-28] 24 (01/03 0438) BP: (84-98)/(60-68) 93/62 (01/03 0438) SpO2:  [90 %-94 %] 90 % (01/03 0438)  Intake/Output from previous day: 01/02 0701 - 01/03 0700 In: 1053 [P.O.:600; I.V.:3; IV Piggyback:450] Out: 1900 [Urine:1850; Chest Tube:50]  General appearance: alert, cooperative and no distress Heart: regular rate and rhythm Lungs: diminished breath sounds bilaterally Abdomen: soft, non-tender; bowel sounds normal; no masses,  no organomegaly Extremities: extremities normal, atraumatic, no cyanosis or edema Wound: some erythema present, mild serous drainage  Lab Results: Recent Labs    10/03/17 0203 10/04/17 0253  WBC 16.8* 16.2*  HGB 9.7* 9.4*  HCT 29.6* 29.6*  PLT 730* 729*   BMET:  Recent Labs    10/03/17 0203 10/04/17 0253  NA 134* 134*  K 3.7 3.5  CL 105 107  CO2 21* 21*  GLUCOSE 106* 114*  BUN 17 14  CREATININE 1.37* 1.42*  CALCIUM 7.7* 7.3*    PT/INR: No results for input(s): LABPROT, INR in the last 72 hours. ABG    Component Value Date/Time   PHART 7.348 (L) 09/20/2017 0515   HCO3 21.8 09/20/2017 0515   ACIDBASEDEF 3.0 (H) 09/20/2017 0515   O2SAT 98.2 09/20/2017 0515   CBG (last 3)  No results for input(s): GLUCAP in the last 72 hours.  Assessment/Plan:  1. S/P CT guided placement of chest tube- no air leak present, no pneumothorax on CXR, low chest tube output  2. Pulm- CXR remains stable, continue aggressive pulm toilet 3. ID- HCAP, with possible lung abscess, + MRSA from chest tube site cultures, blood cultures,  sputum culture negative to date- leukocytosis is stable, afebrile today.. Remains on Vanc, Zosyn 4. CV- H/O HTN, controlled- on Norvasc 5. DVT prophylaxis on Lovenox 6. Dispo- patient stable, continue IV ABX, watch cultures, continue chest tube   LOS: 6 days    Erin Barrett 10/04/2017 No air leak and minimal drainage (non purulent) from CT- will dc Continue Vanco for CT site infection and Zosyn for pneumonia through today.  Will change to PO antibiotics tomorrow If continues to progress possibly home Saturday  Remo Lipps C. Roxan Hockey, MD [facility] [phone]

## 2017-10-04 NOTE — Progress Notes (Signed)
Orders received to D/C chest tube- as chest tube was placed in interventional radiology, we must have someone from IR come to D/C. Paged Bruning PA, awaiting response.

## 2017-10-05 ENCOUNTER — Inpatient Hospital Stay (HOSPITAL_COMMUNITY): Payer: Medicare Other

## 2017-10-05 MED ORDER — CIPROFLOXACIN HCL 500 MG PO TABS
500.0000 mg | ORAL_TABLET | Freq: Two times a day (BID) | ORAL | Status: DC
  Administered 2017-10-05 – 2017-10-06 (×3): 500 mg via ORAL
  Filled 2017-10-05 (×3): qty 1

## 2017-10-05 NOTE — Discharge Summary (Signed)
Physician Discharge Summary  Patient ID: Nathaniel Long MRN: [id number] DOB/AGE: Mar 15, 1941 77 y.o.  Admit date: 09/28/2017 Discharge date: 10/06/2017  Admission Diagnoses: Hydropneumothorax and pneumonia  Discharge Diagnoses:  Active Problems:   Hydropneumothorax  Patient Active Problem List   Diagnosis Date Noted  . Hydropneumothorax 09/28/2017  . S/P lobectomy of lung 09/19/2017  . Malignant neoplasm of upper lobe of left lung ([place]) 09/13/2017  . Pulmonary nodule, left 09/05/2017  . Mediastinal lymphadenopathy 09/05/2017  . Multiple rib fractures 07/16/2017  . Spondylolisthesis, lumbar region 06/18/2017  . Right hip pain 05/23/2017  . Chronic bilateral low back pain with bilateral sciatica 05/23/2017  . Colon cancer screening 01/12/2017  . Antiplatelet or antithrombotic long-term use 01/12/2017  . PAD (peripheral artery disease) ([place]) 04/13/2011  . Murmur, heart 12/08/2010  . ACTINIC KERATOSIS, HEAD 07/14/2010  . BRUISED ANKLE 07/14/2010  . HYPERTENSION, BENIGN 11/19/2009  . TOBACCO ABUSE 10/28/2009  . EAR PAIN, LEFT 10/28/2009     HPI: 77 yo man with newly diagnosed left upper lobe non-small cell carcinoma.  Nathaniel Long is a 77 yo man with a past history of back pain with sciatica, carotid stenosis, PAD, GERD, hearing loss, ascending aneurysm and CKD. He has a 60 py history of smoking before quitting in July 2018. He recently was working with a chain saw when either the saw kicked back or a limb hit him in the chest. 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 A PET showed moderate uptake with an SUV of 5.5. There as borderline uptake in the hilar and mediastinal lymph nodes, particularly the subcarinal node. Dr. Lamonte Sakai did an ENB and EBUS. 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 Lymph node aspirations showed lymphocytes but no tumor was seen.  He has pain from his recent injuries,  but denies any other chest pain. He denies dyspnea and wheezing. No change in appetite, weight loss, headaches or visual changes.     Discharged Condition: good  Hospital Course: The patient was readmitted to the office for further management.  A chest CT scan was obtained and results are listed below.  A new apical chest tube was placed by interventional radiology on the left side.  This was discontinued on 10/04/2017.  He has been treated with intravenous antibiotics specifically vancomycin and Zosyn.  Culture from chest tube drainage grew out MRSA sensitive to Cipro and he has been placed on oral Cipro with plans for a 14-day course.  He has remained hemodynamically stable and afebrile.  He does have a leukocytosis which is trending down.  Initially it was 22,000 but is currently 16,000.  Clinically he feels well.  He is not requiring oxygen.  He is tolerating routine activities.  He is having bowel movements.  At time of discharge she is felt to be quite stable but will require early follow-up.  Consults: IR  Significant Diagnostic Studies: radiology: CT scan: chest EXAM: CT CHEST WITHOUT CONTRAST  TECHNIQUE: Multidetector CT imaging of the chest was performed following the standard protocol without IV contrast.  COMPARISON:  [facility] radiographs 09/28/2017, dating back to 08/10/2017, previous CT chest 08/17/2017, 08/10/2017, 07/16/2017  FINDINGS: Cardiovascular: Limited evaluation without intravenous contrast. Moderate atherosclerotic calcifications of the aorta. Mild aneurysmal dilatation of the ascending aorta measuring up to 4.2 cm. Coronary artery calcification. Normal heart size. Trace pericardial effusion.  Mediastinum/Nodes: No thyroid mass. Esophagus within normal limits. 1 cm precarinal  lymph node. 12 mm subcarinal lymph node. Pneumomediastinum is present with gas adjacent to the aorta, extending superiorly through the thoracic inlet.  Lungs/Pleura: Moderate left  hydropneumothorax. Patient is status post left upper lobectomy. Left lateral lower chest tube with scattered fluid or debris throughout the tube. Chest tube terminates along the left medial upper pleural space. Interstitial thickening throughout the left lower lobe, could be secondary to edema or infection. Multiple locules of gas and fluid in the posterior left lower lobe. There is emphysema in the right lung. There is a small right pleural effusion.  Upper Abdomen: No acute finding  Musculoskeletal: Old sternal and rib fractures. Extensive subcutaneous emphysema throughout the left chest wall anteriorly, laterally and posteriorly extending to the upper abdomen.  IMPRESSION: 1. Status post left upper lobe lobectomy. Moderate hydropneumothorax on the left side with most of the air collection at the left lung apex, similar to the comparison radiographs. A left lateral ascending chest tube is in place ; there appears to be fluid or debris scattered along the course of the tube, correlate for tube function 2. Gas and fluid collection in the left lower lobe appears different from preoperative CTs, could relate to fluid containing no pneumatoceles. There is diffuse interstitial density throughout the left lower lobe which may reflect edema or less likely infection. Small right pleural effusion and atelectasis at the right base. Mild to moderate emphysema in the right upper lobe. 3. Persistent extensive subcutaneous emphysema involving the left chest wall with pneumomediastinum present, query air leak. 4. Stable mild mediastinal lymph nodes. 5. Old rib and sternal fractures.  Aortic Atherosclerosis (ICD10-I70.0) and Emphysema (ICD10-J43.9).   Electronically Signed   By: Donavan Foil M.D.   On: 09/28/2017 22:49  Treatments: Left-sided apical chest tube placed by interventional radiology  Discharge Exam: Blood pressure (!) 141/62, pulse 66, temperature 98.1 F (36.7 C),  temperature source Oral, resp. rate 20, height 5\' 10"  (1.778 m), weight 192 lb 14.4 oz (87.5 kg), SpO2 98 %.   General appearance: alert and cooperative Neurologic: intact Heart: regular rate and rhythm, S1, S2 normal, no murmur, click, rub or gallop Lungs: clear to auscultation bilaterally Abdomen: soft, non-tender; bowel sounds normal; no masses,  no organomegaly Extremities: extremities normal, atraumatic, no cyanosis or edema and Homans sign is negative, no sign of DVT Wound: chest tube out    Disposition: 06-Home-Health Care Svc   Allergies as of 10/06/2017      Reactions   Gabapentin Other (See Comments)   dizziness   Statins Other (See Comments)   Muscle weakness      Medication List    STOP taking these medications   oxyCODONE 5 MG immediate release tablet Commonly known as:  Oxy IR/ROXICODONE     TAKE these medications   acetaminophen 325 MG tablet Commonly known as:  TYLENOL Take 1 tablet (325 mg total) by mouth every 6 (six) hours as needed for moderate pain or headache.   albuterol (2.5 MG/3ML) 0.083% nebulizer solution Commonly known as:  PROVENTIL Take 3 mLs (2.5 mg total) by nebulization every 6 (six) hours as needed for wheezing or shortness of breath. Dx:C34.12   ALPRAZolam 0.5 MG tablet Commonly known as:  XANAX Take 1 tablet (0.5 mg total) by mouth at bedtime as needed for anxiety.   amLODipine 5 MG tablet Commonly known as:  NORVASC Take 5 mg daily by mouth.   aspirin EC 81 MG tablet Take 1 tablet (81 mg total) by mouth daily.  calcium carbonate 500 MG chewable tablet Commonly known as:  TUMS - dosed in mg elemental calcium Chew 1 tablet by mouth daily as needed for indigestion or heartburn.   CENTRUM PO Take 1 tablet by mouth every evening.   cetirizine 10 MG tablet Commonly known as:  ZYRTEC TAKE 1 TABLET BY MOUTH AT BEDTIME What changed:    how much to take  how to take this  when to take this   ciprofloxacin 500 MG  tablet Commonly known as:  CIPRO Take 1 tablet (500 mg total) by mouth 2 (two) times daily for 13 days.   clotrimazole 1 % cream Commonly known as:  LOTRIMIN Apply 1 application topically 2 (two) times daily.   fish oil-omega-3 fatty acids 1000 MG capsule Take 1 g by mouth every evening.   LIVALO 4 MG Tabs Generic drug:  Pitavastatin Calcium Take 2 mg by mouth every evening.   pantoprazole 40 MG tablet Commonly known as:  PROTONIX Take 40 mg by mouth at bedtime.   polyethylene glycol packet Commonly known as:  MIRALAX / GLYCOLAX Take 17 g by mouth daily as needed for mild constipation.   sodium chloride 0.65 % Soln nasal spray Commonly known as:  OCEAN Place 1 spray into both nostrils as needed for congestion.   traMADol 50 MG tablet Commonly known as:  ULTRAM Take 1 tablet (50 mg total) by mouth every 6 (six) hours as needed for moderate pain.   umeclidinium-vilanterol 62.5-25 MCG/INH Aepb Commonly known as:  ANORO ELLIPTA Inhale 1 puff daily into the lungs.   Vitamin D 2000 units Caps Take 4,000 Units by mouth every evening.      Follow-up Information    Melrose Nakayama, MD Follow up.   Specialty:  Cardiothoracic Surgery Why:  Appointment is being arranged for you to see Dr. Roxan Hockey on October 16, 2017.  The office will contact you with this.  Please obtain a chest x-ray at [facility] 1/2-hour prior to the appointment.  [facility] is located in same Therapist, art information: [address] [place] [address] [phone]        [name], MD. Call in 1 day(s).   Specialty:  Internal Medicine Contact information: [address] [place] [address] [phone]           Signed: Elgie Collard 10/06/2017, 10:18 AM

## 2017-10-05 NOTE — Progress Notes (Addendum)
[address]       [place] [address]             [phone]         Subjective: Wants to go home, feels well  Objective: Vital signs in last 24 hours: Temp:  [97.9 F (36.6 C)-98.5 F (36.9 C)] 98 F (36.7 C) (01/04 0316) Pulse Rate:  [64-73] 66 (01/04 0316) Cardiac Rhythm: Normal sinus rhythm (01/04 0704) Resp:  [19-26] 24 (01/04 0316) BP: (85-139)/(58-64) 115/58 (01/04 0316) SpO2:  [90 %-94 %] 92 % (01/04 0316)  Hemodynamic parameters for last 24 hours:    Intake/Output from previous day: 01/03 0701 - 01/04 0700 In: 0539 [P.O.:1080; I.V.:3; IV Piggyback:300] Out: 1451 [Urine:1450; Stool:1] Intake/Output this shift: No intake/output data recorded.  General appearance: alert, cooperative and no distress Heart: regular rate and rhythm Lungs: dim L>R lower fields Abdomen: mild distension, + BS- just had BM Extremities: no edema Wound: incis healing well, min drainage from CT site  Lab Results: Recent Labs    10/03/17 0203 10/04/17 0253  WBC 16.8* 16.2*  HGB 9.7* 9.4*  HCT 29.6* 29.6*  PLT 730* 729*   BMET:  Recent Labs    10/03/17 0203 10/04/17 0253  NA 134* 134*  K 3.7 3.5  CL 105 107  CO2 21* 21*  GLUCOSE 106* 114*  BUN 17 14  CREATININE 1.37* 1.42*  CALCIUM 7.7* 7.3*    PT/INR: No results for input(s): LABPROT, INR in the last 72 hours. ABG    Component Value Date/Time   PHART 7.348 (L) 09/20/2017 0515   HCO3 21.8 09/20/2017 0515   ACIDBASEDEF 3.0 (H) 09/20/2017 0515   O2SAT 98.2 09/20/2017 0515   CBG (last 3)  No results for input(s): GLUCAP in the last 72 hours.  Meds Scheduled Meds: . amLODipine  5 mg Oral Daily  . aspirin EC  81 mg Oral Daily  . Chlorhexidine Gluconate Cloth  6 each Topical Daily  . docusate sodium  100 mg Oral BID  . enoxaparin (LOVENOX) injection  30 mg Subcutaneous Q24H  . levalbuterol  0.63 mg Nebulization BID  . loratadine  10 mg Oral Daily  . mupirocin ointment  1 application  Nasal BID  . pantoprazole  40 mg Oral QHS  . sodium chloride flush  3 mL Intravenous Q12H  . sodium chloride flush  3 mL Intravenous Q12H  . umeclidinium-vilanterol  1 puff Inhalation Daily   Continuous Infusions: . sodium chloride    . piperacillin-tazobactam (ZOSYN)  IV 3.375 g (10/05/17 0518)  . vancomycin Stopped (10/04/17 2300)   PRN Meds:.sodium chloride, acetaminophen **OR** acetaminophen, ALPRAZolam, bisacodyl, calcium carbonate, ondansetron **OR** ondansetron (ZOFRAN) IV, polyethylene glycol, potassium chloride, sodium chloride, sodium chloride flush, traMADol  Xrays Dg Chest Port 1 View  Result Date: 10/04/2017 CLINICAL DATA:  Status post left chest tube removal EXAM: PORTABLE CHEST 1 VIEW COMPARISON:  10/04/2017 FINDINGS: Cardiac shadow is stable. Persistent left volume loss is seen. The chest tube has been removed. No recurrent pneumothorax is noted. Small amount of pleural fluid is again seen subcutaneous emphysema is again noted as well. No new focal abnormality is seen. IMPRESSION: No recurrent pneumothorax following chest tube removal. Persistent changes on the left are noted. Electronically Signed   By: Inez Catalina M.D.   On: 10/04/2017 15:41   Dg Chest Port 1 View  Result Date: 10/04/2017 CLINICAL DATA:  Hydropneumothorax on the left with chest tube treatment. EXAM: PORTABLE  CHEST 1 VIEW COMPARISON:  Portable chest x-ray of October 03, 2017 FINDINGS: The right lung is adequately inflated. On the left there is persistent volume loss. There is increased interstitial density in the aerated portion of the lung. There is soft tissue density in the pulmonary apex and inferiorly and laterally. No significant pneumothorax is observed. There is no mediastinal shift. The cardiac silhouette is enlarged. The pulmonary vascularity is not engorged. The small caliber chest tube is in stable position with the pigtail overlying the posterior left thirty-fourth rib interspace. IMPRESSION: Status  post left upper lobectomy and lymph node dissection. Persistent volume loss on the left. No significant pleural space air is observed though there is moderate amount of subcutaneous emphysema on the left. There remains a moderate amount of pleural fluid surrounding the aerated portion of the left lung. The chest tube is in stable position. Both lungs exhibit mild interstitial prominence which may reflect interstitial edema. Stable cardiomegaly. Thoracic aortic atherosclerosis. Electronically Signed   By: David  Martinique M.D.   On: 10/04/2017 07:32   Results for orders placed or performed during the hospital encounter of 09/28/17  Aerobic/Anaerobic Culture (surgical/deep wound)     Status: None   Collection Time: 09/28/17  5:36 PM  Result Value Ref Range Status   Specimen Description CHEST SINUS DRAINAGE  Final   Special Requests NONE  Final   Gram Stain   Final    FEW WBC PRESENT, PREDOMINANTLY PMN ABUNDANT GRAM POSITIVE COCCI    Culture   Final    ABUNDANT METHICILLIN RESISTANT STAPHYLOCOCCUS AUREUS NO ANAEROBES ISOLATED    Report Status 10/03/2017 FINAL  Final   Organism ID, Bacteria METHICILLIN RESISTANT STAPHYLOCOCCUS AUREUS  Final      Susceptibility   Methicillin resistant staphylococcus aureus - MIC*    CIPROFLOXACIN <=0.5 SENSITIVE Sensitive     ERYTHROMYCIN >=8 RESISTANT Resistant     GENTAMICIN <=0.5 SENSITIVE Sensitive     OXACILLIN RESISTANT Resistant     TETRACYCLINE <=1 SENSITIVE Sensitive     VANCOMYCIN <=0.5 SENSITIVE Sensitive     TRIMETH/SULFA <=10 SENSITIVE Sensitive     CLINDAMYCIN <=0.25 SENSITIVE Sensitive     RIFAMPIN <=0.5 SENSITIVE Sensitive     Inducible Clindamycin NEGATIVE Sensitive     * ABUNDANT METHICILLIN RESISTANT STAPHYLOCOCCUS AUREUS  Culture, blood (Routine X 2) w Reflex to ID Panel     Status: None   Collection Time: 09/28/17  7:37 PM  Result Value Ref Range Status   Specimen Description BLOOD RIGHT ANTECUBITAL  Final   Special Requests   Final     BOTTLES DRAWN AEROBIC AND ANAEROBIC Blood Culture adequate volume   Culture NO GROWTH 5 DAYS  Final   Report Status 10/03/2017 FINAL  Final  Culture, blood (Routine X 2) w Reflex to ID Panel     Status: None   Collection Time: 09/28/17  7:47 PM  Result Value Ref Range Status   Specimen Description BLOOD RIGHT HAND  Final   Special Requests   Final    BOTTLES DRAWN AEROBIC ONLY Blood Culture adequate volume   Culture NO GROWTH 5 DAYS  Final   Report Status 10/03/2017 FINAL  Final  Culture, expectorated sputum-assessment     Status: None   Collection Time: 09/30/17  1:17 PM  Result Value Ref Range Status   Specimen Description SPUTUM  Final   Special Requests Normal  Final   Sputum evaluation THIS SPECIMEN IS ACCEPTABLE FOR SPUTUM CULTURE  Final   Report  Status 09/30/2017 FINAL  Final  Culture, respiratory (NON-Expectorated)     Status: None   Collection Time: 09/30/17  1:17 PM  Result Value Ref Range Status   Specimen Description SPUTUM  Final   Special Requests Normal Reflexed from [id number]  Final   Gram Stain   Final    ABUNDANT WBC PRESENT, PREDOMINANTLY PMN RARE SQUAMOUS EPITHELIAL CELLS PRESENT RARE GRAM POSITIVE COCCI IN PAIRS RARE GRAM POSITIVE RODS    Culture Consistent with normal respiratory flora.  Final   Report Status 10/03/2017 FINAL  Final  Culture, body fluid-bottle     Status: None (Preliminary result)   Collection Time: 10/01/17  4:52 PM  Result Value Ref Range Status   Specimen Description FLUID PLEURAL  Final   Special Requests   Final    BOTTLES DRAWN AEROBIC ONLY Blood Culture adequate volume   Culture NO GROWTH 3 DAYS  Final   Report Status PENDING  Incomplete  Gram stain     Status: None   Collection Time: 10/01/17  4:52 PM  Result Value Ref Range Status   Specimen Description FLUID PLEURAL  Final   Special Requests NONE  Final   Gram Stain   Final    RARE WBC PRESENT, PREDOMINANTLY MONONUCLEAR NO ORGANISMS SEEN    Report Status 10/02/2017 FINAL   Final   Assessment/Plan:  LOS: 7 days    1 afeb, hemodyn stable 2 CXR is stable in appearance 3 no new labs 4 change abx to po cipro- will be for 14 day course 5 cont pulm toilet/routine rehab  [name] 10/05/2017 Patient seen and examined, agree with above Home tomorrow if remains afebrile with PO antibiotics  Remo Lipps C. Roxan Hockey, MD [facility] [phone]

## 2017-10-06 LAB — CULTURE, BODY FLUID-BOTTLE
CULTURE: NO GROWTH
SPECIAL REQUESTS: ADEQUATE

## 2017-10-06 MED ORDER — CIPROFLOXACIN HCL 500 MG PO TABS: 500.0000 mg | ORAL_TABLET | Freq: Two times a day (BID) | ORAL | 0 refills | Status: AC

## 2017-10-06 MED ORDER — TRAMADOL HCL 50 MG PO TABS: 50.0000 mg | ORAL_TABLET | Freq: Four times a day (QID) | ORAL | 0 refills | Status: DC | PRN

## 2017-10-06 NOTE — Progress Notes (Signed)
Will d/c once home health confirmed; called care management & they're working on it.   [name]  Sherrel Shafer, RN

## 2017-10-06 NOTE — Progress Notes (Signed)
Patient ID: Nathaniel Long, male   DOB: 1941-02-19, 77 y.o.   MRN: [id number] [facility] DAILY ICU PROGRESS NOTE                   [address]            [place] [address]          [phone]       Total Length of Stay:  LOS: 8 days   Subjective: Feels  Well, wants to go home early today " before someone changes their mind"  Objective: Vital signs in last 24 hours: Temp:  [97.6 F (36.4 C)-98.4 F (36.9 C)] 98.1 F (36.7 C) (01/05 0748) Pulse Rate:  [66-75] 66 (01/05 0748) Cardiac Rhythm: Normal sinus rhythm (01/05 0901) Resp:  [20-26] 20 (01/05 0324) BP: (118-144)/(55-77) 140/62 (01/05 0748) SpO2:  [92 %-99 %] 98 % (01/05 0755) FiO2 (%):  [21 %] 21 % (01/05 0755)  Filed Weights   09/28/17 1755 09/28/17 1806 09/30/17 0610  Weight: 192 lb 0.3 oz (87.1 kg) 192 lb 0.3 oz (87.1 kg) 192 lb 14.4 oz (87.5 kg)    Weight change:    Hemodynamic parameters for last 24 hours:    Intake/Output from previous day: 01/04 0701 - 01/05 0700 In: 483 [P.O.:480; I.V.:3] Out: 1875 [Urine:1875]  Intake/Output this shift: No intake/output data recorded.  Current Meds: Scheduled Meds: . amLODipine  5 mg Oral Daily  . aspirin EC  81 mg Oral Daily  . ciprofloxacin  500 mg Oral BID  . docusate sodium  100 mg Oral BID  . enoxaparin (LOVENOX) injection  30 mg Subcutaneous Q24H  . levalbuterol  0.63 mg Nebulization BID  . loratadine  10 mg Oral Daily  . mupirocin ointment  1 application Nasal BID  . pantoprazole  40 mg Oral QHS  . sodium chloride flush  3 mL Intravenous Q12H  . sodium chloride flush  3 mL Intravenous Q12H  . umeclidinium-vilanterol  1 puff Inhalation Daily   Continuous Infusions: . sodium chloride     PRN Meds:.sodium chloride, acetaminophen **OR** acetaminophen, ALPRAZolam, bisacodyl, calcium carbonate, ondansetron **OR** ondansetron (ZOFRAN) IV, polyethylene glycol, potassium chloride, sodium chloride, sodium chloride flush, traMADol  General  appearance: alert and cooperative Neurologic: intact Heart: regular rate and rhythm, S1, S2 normal, no murmur, click, rub or gallop Lungs: clear to auscultation bilaterally Abdomen: soft, non-tender; bowel sounds normal; no masses,  no organomegaly Extremities: extremities normal, atraumatic, no cyanosis or edema and Homans sign is negative, no sign of DVT Wound: chest tube out   Lab Results: CBC: Recent Labs    10/04/17 0253  WBC 16.2*  HGB 9.4*  HCT 29.6*  PLT 729*   BMET:  Recent Labs    10/04/17 0253  NA 134*  K 3.5  CL 107  CO2 21*  GLUCOSE 114*  BUN 14  CREATININE 1.42*  CALCIUM 7.3*    CMET: Lab Results  Component Value Date   WBC 16.2 (H) 10/04/2017   HGB 9.4 (L) 10/04/2017   HCT 29.6 (L) 10/04/2017   PLT 729 (H) 10/04/2017   GLUCOSE 114 (H) 10/04/2017   CHOL 129 02/21/2012   TRIG 129 02/21/2012   HDL 34 (L) 02/21/2012   LDLCALC 69 02/21/2012   ALT 19 09/28/2017   AST 22 09/28/2017   NA 134 (L) 10/04/2017   K 3.5 10/04/2017   CL 107 10/04/2017   CREATININE 1.42 (H) 10/04/2017   BUN 14 10/04/2017  CO2 21 (L) 10/04/2017   TSH 3.777 02/21/2012   INR 0.99 09/17/2017   HGBA1C 6.0 02/21/2012      PT/INR: No results for input(s): LABPROT, INR in the last 72 hours. Radiology: No results found.   Assessment/Plan: No fever today , plan d/c today follow up in office soon      Grace Isaac 10/06/2017 9:07 AM

## 2017-10-06 NOTE — Discharge Instructions (Signed)
Ciprofloxacin tablets What is this medicine? CIPROFLOXACIN (sip roe FLOX a sin) is a quinolone antibiotic. It is used to treat certain kinds of bacterial infections. It will not work for colds, flu, or other viral infections. This medicine may be used for other purposes; ask your health care provider or pharmacist if you have questions. COMMON BRAND NAME(S): Cipro What should I tell my health care provider before I take this medicine? They need to know if you have any of these conditions: -bone problems -history of low levels of potassium in the blood -joint problems -irregular heartbeat -kidney disease -myasthenia gravis -seizures -tendon problems -tingling of the fingers or toes, or other nerve disorder -an unusual or allergic reaction to ciprofloxacin, other antibiotics or medicines, foods, dyes, or preservatives -pregnant or trying to get pregnant -breast-feeding How should I use this medicine? Take this medicine by mouth with a glass of water. Follow the directions on the prescription label. Take your medicine at regular intervals. Do not take your medicine more often than directed. Take all of your medicine as directed even if you think your are better. Do not skip doses or stop your medicine early. You can take this medicine with food or on an empty stomach. It can be taken with a meal that contains dairy or calcium, but do not take it alone with a dairy product, like milk or yogurt or calcium-fortified juice. A special MedGuide will be given to you by the pharmacist with each prescription and refill. Be sure to read this information carefully each time. Talk to your pediatrician regarding the use of this medicine in children. Special care may be needed. Overdosage: If you think you have taken too much of this medicine contact a poison control center or emergency room at once. NOTE: This medicine is only for you. Do not share this medicine with others. What if I miss a dose? If you  miss a dose, take it as soon as you can. If it is almost time for your next dose, take only that dose. Do not take double or extra doses. What may interact with this medicine? Do not take this medicine with any of the following medications: -cisapride -dofetilide -dronedarone -flibanserin -lomitapide -pimozide -thioridazine -tizanidine -ziprasidone This medicine may also interact with the following medications: -antacids -birth control pills -caffeine -certain medicines for diabetes, like glipizide or glyburide -certain medicines that treat or prevent blood clots like warfarin -clozapine -cyclosporine -didanosine (ddI) buffered tablets or powder -duloxetine -lanthanum carbonate -lidocaine -methotrexate -multivitamins -NSAIDS, medicines for pain and inflammation, like ibuprofen or naproxen -olanzapine -omeprazole -other medicines that prolong the QT interval (cause an abnormal heart rhythm) -phenytoin -probenecid -ropinirole -sevelamer -sildenafil -sucralfate -theophylline -zolpidem This list may not describe all possible interactions. Give your health care provider a list of all the medicines, herbs, non-prescription drugs, or dietary supplements you use. Also tell them if you smoke, drink alcohol, or use illegal drugs. Some items may interact with your medicine. What should I watch for while using this medicine? Tell your doctor or health care professional if your symptoms do not improve. Do not treat diarrhea with over the counter products. Contact your doctor if you have diarrhea that lasts more than 2 days or if it is severe and watery. You may get drowsy or dizzy. Do not drive, use machinery, or do anything that needs mental alertness until you know how this medicine affects you. Do not stand or sit up quickly, especially if you are an older patient. This reduces  the risk of dizzy or fainting spells. This medicine can make you more sensitive to the sun. Keep out of the  sun. If you cannot avoid being in the sun, wear protective clothing and use sunscreen. Do not use sun lamps or tanning beds/booths. Avoid antacids, aluminum, calcium, iron, magnesium, and zinc products for 6 hours before and 2 hours after taking a dose of this medicine. What side effects may I notice from receiving this medicine? Side effects that you should report to your doctor or health care professional as soon as possible: -allergic reactions like skin rash or hives, swelling of the face, lips, or tongue -anxious -confusion -depressed mood -diarrhea -fast, irregular heartbeat -hallucination, loss of contact with reality -joint, muscle, or tendon pain or swelling -pain, tingling, numbness in the hands or feet -suicidal thoughts or other mood changes -sunburn -unusually weak or tired Side effects that usually do not require medical attention (report to your doctor or health care professional if they continue or are bothersome): -dry mouth -headache -nausea -trouble sleeping This list may not describe all possible side effects. Call your doctor for medical advice about side effects. You may report side effects to FDA at 1-800-FDA-1088. Where should I keep my medicine? Keep out of the reach of children. Store at room temperature below 30 degrees C (86 degrees F). Keep container tightly closed. Throw away any unused medicine after the expiration date. NOTE: This sheet is a summary. It may not cover all possible information. If you have questions about this medicine, talk to your doctor, pharmacist, or health care provider.  2018 Elsevier/Gold Standard (2016-04-28 14:42:02) Healthcare-Associated Pneumonia Healthcare-associated pneumonia is a lung infection that a person can get when in a health care setting or during certain procedures. The infection causes air sacs inside the lungs to fill with pus or fluid. Healthcare-associated pneumonia is usually caused by bacteria that are common  in health care settings. These bacteria may be resistant to some antibiotic medicines. What are the causes? This condition is caused by bacteria that get into your lungs. You can get this condition if you:  Breathe in droplets from an infected person's cough or sneeze.  Touch something that an infected person coughed or sneezed on and then touch your mouth, nose, or eyes.  Have a bacterial infection somewhere else in your body, if the bacteria spread to your lungs through your blood.  What increases the risk? This condition is more likely to develop in people who:  Have a disease that weakens their body's defense system (immune system) or their ability to cough out germs.  Are older than age 40.  Having trouble swallowing.  Use a feeding or breathing tube.  Have a cold or the flu.  Have an IV tube inserted in a vein.  Have surgery.  Have a bed sore.  Live in a long-term care facility, such as a nursing home.  Were in the hospital for two or more days in the past 3 months.  Received hemodialysis in the past 30 days.  What are the signs or symptoms? Symptoms of this condition include:  Fever.  Chills.  Cough.  Shortness of breath.  Wheezing or crackling sounds when breathing.  How is this diagnosed? This condition may be diagnosed based on:  Your symptoms.  A chest X-ray.  A measurement of the amount of oxygen in your blood.  How is this treated? This condition is treated with antibiotics. Your health care provider may take a sample of cells (  culture) from your throat to determine what type of bacteria is in your lungs and change your antibiotic based on the results. If you have bacteria in your blood, trouble breathing, or a low oxygen level, you may need to be treated at the hospital. At the hospital, you will be given antibiotics through an IV tube. You may also be given oxygen or breathing treatments. Follow these instructions at home: Medicine  Take  your antibiotic medicine as told by your health care provider. Do not stop taking the antibiotic even if you start to feel better.  Take over-the-counter and other prescription medicines only as told by your health care provider. Activity  Rest at home until you feel better.  Return to your normal activities as told by your health care provider. Ask your health care provider what activities are safe for you. General instructions  Drink enough fluid to keep your urine clear or pale yellow.  Do not use any products that contain nicotine or tobacco, such as cigarettes and e-cigarettes. If you need help quitting, ask your health care provider.  Limit alcohol intake to no more than 1 drink per day for nonpregnant women and 2 drinks per day for men. One drink equals 12 oz of beer, 5 oz of wine, or 1 oz of hard liquor.  Keep all follow-up visits as told by your health care provider. This is important. How is this prevented? Actions that I can take To lower your risk of getting this condition again:  Do not smoke. This includes e-cigarettes.  Do not drink too much alcohol.  Keep your immune system healthy by eating well and getting enough sleep.  Get a flu shot every year (annually).  Get a pneumonia vaccination if: ? You are older than age 9. ? You smoke. ? You have a long-lasting condition like lung disease.  Exercise your lungs by taking deep breaths, walking, and using an incentive spirometer as directed.  Wash your hands often with soap and water. If you cannot get to a sink to wash your hands, use an alcohol-based hand cleaner.  Make sure your health care providers are washing their hands. If you do not see them wash their hands, ask them to do so.  When you are in a health care facility, avoid touching your eyes, nose, and mouth.  Avoid touching any surface near where people have coughed or sneezed.  Stand away from sick people when they are coughing or sneezing.  Wear  a mask if you cannot avoid exposure to people who are sick.  Clean all surfaces often with a disinfectant cleaner, especially if someone is sick at home or work.  Precautions of my health care team Hospitals, nursing homes, and other health care facilities take special care to try to prevent healthcare-associated pneumonia. To do this, your health care team may:  Clean their hands with soap and water or with alcohol-based hand sanitizer before and after seeing patients.  Wear gloves or masks during treatment.  Sanitize medical instruments, tubes, other equipment, and surfaces in patient rooms.  Raise (elevate) the head of your hospital bed so you are not lying flat. The head of the bed may be elevated 30 degrees or more.  Have you sit up and move around as soon as possible after surgery.  Only insert a breathing tube if needed.  Do these things for you if you have a breathing tube: ? Clean the inside of your mouth regularly. ? Remove the breathing tube  as soon as it is no longer needed.  Contact a health care provider if:  Your symptoms do not get better or they get worse.  Your symptoms come back after you have finished taking your antibiotics. Get help right away if:  You have trouble breathing.  You have confusion or difficulty thinking. This information is not intended to replace advice given to you by your health care provider. Make sure you discuss any questions you have with your health care provider. Document Released: 02/08/2016 Document Revised: 07/04/2016 Document Reviewed: 06/16/2016 Elsevier Interactive Patient Education  [employer].

## 2017-10-06 NOTE — Progress Notes (Signed)
Pt's IV x2 removed by NT; educated on d/c instructions; prescription given to wife; wife educated on incision site care; home health set up; belongings collected; pt will be wheeled out once ride ready.   [name]  Jakiera Ehler, RN

## 2017-10-06 NOTE — Care Management (Signed)
Pt with d/c order.  Pt will go home on PO Cipro.  [place] set up with [facility].  Jermaine with [facility] advised that pt will d/c today.  HH/F2F orders obtained.

## 2017-10-08 ENCOUNTER — Encounter (HOSPITAL_COMMUNITY): Payer: Self-pay

## 2017-10-08 DIAGNOSIS — Z483 Aftercare following surgery for neoplasm: Secondary | ICD-10-CM | POA: Diagnosis not present

## 2017-10-08 DIAGNOSIS — C3412 Malignant neoplasm of upper lobe, left bronchus or lung: Secondary | ICD-10-CM | POA: Diagnosis not present

## 2017-10-16 ENCOUNTER — Encounter: Payer: Self-pay | Admitting: Thoracic Surgery (Cardiothoracic Vascular Surgery)

## 2017-10-16 ENCOUNTER — Other Ambulatory Visit: Payer: Self-pay

## 2017-10-16 ENCOUNTER — Encounter (HOSPITAL_COMMUNITY): Payer: Self-pay

## 2017-10-16 ENCOUNTER — Ambulatory Visit (INDEPENDENT_AMBULATORY_CARE_PROVIDER_SITE_OTHER): Payer: Self-pay | Admitting: Thoracic Surgery (Cardiothoracic Vascular Surgery)

## 2017-10-16 ENCOUNTER — Ambulatory Visit
Admission: RE | Admit: 2017-10-16 | Discharge: 2017-10-16 | Disposition: A | Discharge status: Home / Self Care | Payer: Medicare Other | Source: Ambulatory Visit | Attending: Thoracic Surgery (Cardiothoracic Vascular Surgery) | Admitting: Thoracic Surgery (Cardiothoracic Vascular Surgery)

## 2017-10-16 VITALS — BP 133/78 | HR 76 | Resp 18 | Ht 70.5 in | Wt 176.8 lb

## 2017-10-16 DIAGNOSIS — C3492 Malignant neoplasm of unspecified part of left bronchus or lung: Secondary | ICD-10-CM

## 2017-10-16 DIAGNOSIS — C3412 Malignant neoplasm of upper lobe, left bronchus or lung: Secondary | ICD-10-CM

## 2017-10-16 DIAGNOSIS — Z902 Acquired absence of lung [part of]: Secondary | ICD-10-CM

## 2017-10-16 NOTE — Progress Notes (Signed)
[address]       [place] [address]             [phone]     HPI: Mr. Allbritton returns for a scheduled follow-up visit  Mr. Costilla is a 77 year old man with a past medical history significant for back pain, carotid stenosis, for arterial disease, tobacco abuse, reflux, severe hearing loss, and ascending and.  He was working with a chainsaw back in July when kicked back and hit him in the chest.  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  On PET CT the nodule was hypermetabolic with an SUV of 5.5.  He had a navigational bronchoscopy and endobronchial ultrasound by Dr. Lamonte Sakai.  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  Lymph node aspirations were negative.  I did a thoracoscopic left upper lobectomy on 09/19/2017.  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  He had a prolonged air leak and was sent home with a chest tube.  However he was readmitted with drainage around the chest tube which grew out MRSA.  The chest tube itself is clogged.  We had a pigtail catheter placed anteriorly and removed the chest tube.  His air leak resolved quickly and pigtail catheter was removed.  He was discharged on 10/06/2017.  Since discharge she has not had any additional problems.  He is almost finished with his Cipro.  Current Outpatient Medications  Medication Sig Dispense Refill  . albuterol (PROVENTIL) (2.5 MG/3ML) 0.083% nebulizer solution Take 3 mLs (2.5 mg total) by nebulization every 6 (six) hours as needed for wheezing or shortness of breath. Dx:C34.12 120 mL 11  . ALPRAZolam (XANAX) 0.5 MG tablet Take 1 tablet (0.5 mg total) by mouth at bedtime as needed for anxiety.  0  . amLODipine (NORVASC) 5 MG tablet Take 5 mg daily by mouth.  0  . calcium carbonate (TUMS - DOSED IN MG ELEMENTAL CALCIUM) 500 MG chewable tablet Chew 1 tablet by mouth daily as needed for indigestion or heartburn.    . cetirizine (ZYRTEC) 10  MG tablet TAKE 1 TABLET BY MOUTH AT BEDTIME (Patient taking differently: Take 10 mg daily as needed for allergies) 31 tablet 3  . Cholecalciferol (VITAMIN D) 2000 units CAPS Take 4,000 Units by mouth every evening.    . ciprofloxacin (CIPRO) 500 MG tablet Take 1 tablet (500 mg total) by mouth 2 (two) times daily for 13 days. 26 tablet 0  . clotrimazole (LOTRIMIN) 1 % cream Apply 1 application topically 2 (two) times daily.    . fish oil-omega-3 fatty acids 1000 MG capsule Take 1 g by mouth every evening.     . Multiple Vitamins-Minerals (CENTRUM PO) Take 1 tablet by mouth every evening.     . pantoprazole (PROTONIX) 40 MG tablet Take 40 mg by mouth at bedtime.     . sodium chloride (OCEAN) 0.65 % SOLN nasal spray Place 1 spray into both nostrils as needed for congestion.    Marland Kitchen umeclidinium-vilanterol (ANORO ELLIPTA) 62.5-25 MCG/INH AEPB Inhale 1 puff daily into the lungs. 60 each 3   No current facility-administered medications for this visit.    Facility-Administered Medications Ordered in Other Visits  Medication Dose Route Frequency Provider Last Rate Last Dose  . 0.9 %  sodium chloride infusion  250 mL Intravenous PRN Gold, Wayne E, PA-C      . levalbuterol (  XOPENEX) nebulizer solution 0.63 mg  0.63 mg Nebulization Q6H PRN Gaye Pollack, MD   0.63 mg at 09/29/17 1646  . sodium chloride flush (NS) 0.9 % injection 3 mL  3 mL Intravenous Q12H Gold, Wayne E, PA-C      . sodium chloride flush (NS) 0.9 % injection 3 mL  3 mL Intravenous Q12H Gold, Wayne E, PA-C      . sodium chloride flush (NS) 0.9 % injection 3 mL  3 mL Intravenous PRN Gold, Wayne E, PA-C        Physical Exam BP 133/78 (BP Location: Right Arm, Patient Position: Sitting, Cuff Size: Large)   Pulse 76   Resp 18   Ht 5' 10.5" (1.791 m)   Wt 176 lb 12.8 oz (80.2 kg)   SpO2 96% Comment: RA  BMI 25.7 kg/m  77 year old man in no acute distress Alert and oriented x3 with no focal motor deficits Extremely hard of  hearing Lungs diminished at left base, otherwise clear Cardiac regular rate and rhythm normal S1 and S2 No peripheral edema Incisions healing well  Diagnostic Tests: CHEST  2 VIEW  COMPARISON:  10/05/2017  FINDINGS: Evidence of patient's recent left upper lobectomy with stable postsurgical findings of the left lung and hemithorax. Near resolution of the previously noted subcutaneous emphysema over the left chest. Right lung is clear. Remainder of the exam is unchanged.  IMPRESSION: Stable post left upper lobectomy changes of the left lung.   Electronically Signed   By: Marin Olp M.D.   On: 10/16/2017 14:38 I personally reviewed the chest x-ray images and concur with the findings noted above  Impression: Mr. Menter is a 77 year old man who had a thoracoscopic left upper lobectomy for a stage IA non-small cell carcinoma about a month ago.  He had a prolonged air leak initially but that is resolved.  His chest x-ray today shows continued improved aeration in the left lower lobe.  He had a MRSA infection at the site of his chest tube.  He is nearly completed antibiotics for that but the chest tube site is healing well with no erythema, induration, or drainage.  Stage IA non-small cell carcinoma-  we will refer to [place] to see Dr. Julien Nordmann.  He will not need adjuvant therapy but will need continued follow-up.  There are no restrictions from a surgical standpoint at this time.  I did advise him to avoid using chain saws.  He may begin driving on a limited basis.  Plan: [facility] referral Return in 2 months with PA and lateral chest x-ray  Melrose Nakayama, MD [facility] [phone]

## 2017-10-18 LAB — ACID FAST CULTURE WITH REFLEXED SENSITIVITIES: ACID FAST CULTURE - AFSCU3: NEGATIVE

## 2017-10-18 LAB — ACID FAST CULTURE WITH REFLEXED SENSITIVITIES (MYCOBACTERIA)

## 2017-10-24 ENCOUNTER — Telehealth: Payer: Self-pay | Admitting: Pulmonary Disease

## 2017-10-24 NOTE — Telephone Encounter (Signed)
Spoke with the pt's spouse  She states that pt received rx for levaquin in the mail and it was prescribed by Dr Annamaria Boots  I do not see that [facility] prescribed anything for this pt  I called and spoke with Corene Cornea at [facility]  He states he will need to check with pharmacy dept and have them reach out to the pt  He believes that there was a mix up and this was prescribed by Dr Reynaldo Minium I spoke with the pt's spouse and notified that [facility] will be reaching out to them about this situation She verbalized understanding

## 2017-10-26 ENCOUNTER — Telehealth: Payer: Self-pay | Admitting: *Deleted

## 2017-10-26 DIAGNOSIS — C3412 Malignant neoplasm of upper lobe, left bronchus or lung: Secondary | ICD-10-CM

## 2017-10-26 NOTE — Telephone Encounter (Signed)
Oncology Nurse Navigator Documentation  Oncology Nurse Navigator Flowsheets 10/26/2017  Navigator Location [facility]  Referral date to RadOnc/MedOnc 10/18/2017  Navigator Encounter Type Telephone/I received referral on Mr. Wildes to be scheduled for [place].  I called and scheduled for 11/08/17.   Telephone Outgoing Call  Treatment Phase Other  Barriers/Navigation Needs Coordination of Care  Interventions Coordination of Care  Coordination of Care Appts  Acuity Level 2  Time Spent with Patient 30

## 2017-11-08 ENCOUNTER — Inpatient Hospital Stay: Payer: Medicare Other

## 2017-11-08 ENCOUNTER — Telehealth: Payer: Self-pay | Admitting: Internal Medicine

## 2017-11-08 ENCOUNTER — Ambulatory Visit: Payer: Medicare Other | Admitting: Physical Therapy

## 2017-11-08 ENCOUNTER — Encounter: Payer: Self-pay | Admitting: General Practice

## 2017-11-08 ENCOUNTER — Inpatient Hospital Stay: Payer: Medicare Other | Attending: Internal Medicine | Admitting: Internal Medicine

## 2017-11-08 ENCOUNTER — Encounter: Payer: Self-pay | Admitting: Internal Medicine

## 2017-11-08 VITALS — BP 124/78 | HR 80 | Temp 98.4°F | Resp 22 | Ht 70.5 in | Wt 178.9 lb

## 2017-11-08 DIAGNOSIS — Z902 Acquired absence of lung [part of]: Secondary | ICD-10-CM | POA: Diagnosis not present

## 2017-11-08 DIAGNOSIS — C3490 Malignant neoplasm of unspecified part of unspecified bronchus or lung: Secondary | ICD-10-CM

## 2017-11-08 DIAGNOSIS — J449 Chronic obstructive pulmonary disease, unspecified: Secondary | ICD-10-CM

## 2017-11-08 DIAGNOSIS — Z87891 Personal history of nicotine dependence: Secondary | ICD-10-CM | POA: Diagnosis not present

## 2017-11-08 DIAGNOSIS — C3412 Malignant neoplasm of upper lobe, left bronchus or lung: Secondary | ICD-10-CM | POA: Insufficient documentation

## 2017-11-08 LAB — CMP (CANCER CENTER ONLY)
ALBUMIN: 3.3 g/dL — AB (ref 3.5–5.0)
ALK PHOS: 116 U/L (ref 40–150)
ALT: 8 U/L (ref 0–55)
ANION GAP: 10 (ref 3–11)
AST: 14 U/L (ref 5–34)
BUN: 17 mg/dL (ref 7–26)
CHLORIDE: 105 mmol/L (ref 98–109)
CO2: 22 mmol/L (ref 22–29)
CREATININE: 1.36 mg/dL — AB (ref 0.70–1.30)
Calcium: 9.1 mg/dL (ref 8.4–10.4)
GFR, Est AFR Am: 57 mL/min — ABNORMAL LOW (ref >60)
GFR, Estimated: 49 mL/min — ABNORMAL LOW (ref >60)
GLUCOSE: 98 mg/dL (ref 70–140)
Potassium: 4.5 mmol/L (ref 3.5–5.1)
Sodium: 137 mmol/L (ref 136–145)
Total Bilirubin: 0.6 mg/dL (ref 0.2–1.2)
Total Protein: 7.9 g/dL (ref 6.4–8.3)

## 2017-11-08 LAB — CBC WITH DIFFERENTIAL (CANCER CENTER ONLY)
Basophils Absolute: 0 10*3/uL (ref 0.0–0.1)
Basophils Relative: 0 %
Eosinophils Absolute: 0.3 10*3/uL (ref 0.0–0.5)
Eosinophils Relative: 2 %
HCT: 33.6 % — ABNORMAL LOW (ref 38.4–49.9)
HEMOGLOBIN: 10.8 g/dL — AB (ref 13.0–17.1)
LYMPHS ABS: 2.6 10*3/uL (ref 0.9–3.3)
Lymphocytes Relative: 25 %
MCH: 28.4 pg (ref 27.2–33.4)
MCHC: 32.1 g/dL (ref 32.0–36.0)
MCV: 88.4 fL (ref 79.3–98.0)
MONO ABS: 0.9 10*3/uL (ref 0.1–0.9)
MONOS PCT: 8 %
NEUTROS ABS: 6.7 10*3/uL — AB (ref 1.5–6.5)
NEUTROS PCT: 65 %
NRBC: 0 /100{WBCs}
Platelet Count: 414 10*3/uL — ABNORMAL HIGH (ref 140–400)
RBC: 3.8 MIL/uL — ABNORMAL LOW (ref 4.20–5.82)
RDW: 16.3 % — ABNORMAL HIGH (ref 11.0–14.6)
WBC Count: 10.4 10*3/uL — ABNORMAL HIGH (ref 4.0–10.3)

## 2017-11-08 NOTE — Telephone Encounter (Signed)
Patient scheduled for appointments/ AVS/Calendar printed per 2/7 los

## 2017-11-08 NOTE — Progress Notes (Signed)
[place] [phone]   [phone] Multidisciplinary thoracic oncology clinic  CONSULT NOTE  REFERRING PHYSICIAN: Dr. Modesto Charon  REASON FOR CONSULTATION:  77 years old white male recently diagnosed with lung cancer.  HPI Nathaniel Long is a 77 y.o. male with past medical history significant for chronic back pain, COPD, hypertension, GERD, dyslipidemia, thoracic aortic aneurysm, and anxiety and chronic kidney disease as well as long history for smoking.  The patient mentioned that and middle of October 2018 he has been working with his chainsaw cutting some trees.  The saw hit him back and hurt his the chest.  He had a chest x-ray performed on July 16, 2017 at the emergency department and that showed acute mildly displaced right sixth rib fracture with probable adjacent seventh rib fracture.  CT scan of the chest without contrast on the same day showed 1.6 cm spiculated left upper lobe lung nodule suspicious for lung carcinoma.  He was seen by Dr. Vaughan Browner and a PET scan was performed on August 10, 2017 and that showed 1.6 cm hypermetabolic spiculated pulmonary nodule in the anterior left upper lobe highly suspicious for primary bronchogenic carcinoma.  There was also 1.0 cm subcarinal mediastinal lymph nodes with FDG uptake suspicious for lymph node metastasis.  There was no evidence of metastatic disease within the neck, abdomen or pelvis. The patient was seen by Dr. Lamonte Sakai and he underwent bronchoscopy with endoscopic ultrasound and navigational bronchoscopy on September 05, 2017 with biopsy of the left upper lobe lung mass.  The final pathology [id number]) was consistent with non-small cell carcinoma.  Fine-needle aspiration of 11 L and 7 were negative for malignancy. The patient was referred to Dr. Roxan Hockey and on September 19, 2017 he underwent left VATS with left upper lobectomy and mediastinal lymph node dissection.  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  The dissected lymph nodes were negative for malignancy.  The tissue block was sent to [facility] for molecular studies and PDL 1 expression.  His PDL 1 expression was negative.  There was no actionable mutations seen on the molecular studies. The patient was referred to the multidisciplinary thoracic oncology clinic today for further evaluation and recommendation regarding adjuvant therapy. When seen today he is feeling fine except for shortness of breath with exertion secondary to COPD.  He has mild cough but no significant chest pain or hemoptysis.  He intentionally lost around 10 pounds in the last few months.  He has no headache or visual changes. Family history significant for father died from COPD mother died from heart disease, he has brother with colon polyps and a sister with dementia. The patient is married and has 3 biological children, 2 stepchildren and 2 adopted children.  He was accompanied today by his wife Nathaniel Long.  He is to work as a Dealer for [employer].  He has a history for smoking more than 1 pack/day for around 40 years and quit in May 2018.  He also has a history of alcohol abuse but quit in 1995 and no history of drug abuse. HPI  Past Medical History:  Diagnosis Date  . Anxiety   . Carotid artery stenosis   . Chronic lower back pain   . CKD (chronic kidney disease)   . Complication of anesthesia    "can't pee when I come out of OR" (07/18/2017)  . Contact with chainsaw as cause of accidental injury 07/16/2017   sternal fracture  with bilateral anterior fifth and sixth rib fractures /notes 07/16/2017  . COPD (chronic obstructive pulmonary disease) ([place])   . Dyspnea   . GERD (gastroesophageal reflux disease)   . Hard of hearing   . High cholesterol   . Hypertension   . Thoracic ascending aortic aneurysm [facility])     Past Surgical History:  Procedure Laterality Date  . ABDOMINAL AORTOGRAM W/LOWER EXTREMITY  N/A 05/01/2017   Procedure: Abdominal Aortogram w/Lower Extremity;  Surgeon: Adrian Prows, MD;  Location: [facility] [facility];  Service: Cardiovascular;  Laterality: N/A;  . BACK SURGERY    . FINGER REPLANTATION Left    "ring finger"  . HEMORRHOID SURGERY    . POSTERIOR LUMBAR FUSION  1986  . VIDEO ASSISTED THORACOSCOPY (VATS)/ LOBECTOMY Left 09/19/2017   Procedure: LEFT VIDEO ASSISTED THORACOSCOPY (VATS)/ LEFT UPPER LOBECTOMY, LYMPH NODE DISECTION;  Surgeon: Melrose Nakayama, MD;  Location: [facility];  Service: Thoracic;  Laterality: Left;  Marland Kitchen VIDEO BRONCHOSCOPY WITH ENDOBRONCHIAL NAVIGATION N/A 09/05/2017   Procedure: VIDEO BRONCHOSCOPY WITH ENDOBRONCHIAL NAVIGATION;  Surgeon: Collene Gobble, MD;  [facility];  Service: Thoracic;  Laterality: N/A;  . VIDEO BRONCHOSCOPY WITH ENDOBRONCHIAL ULTRASOUND N/A 09/05/2017   Procedure: VIDEO BRONCHOSCOPY WITH ENDOBRONCHIAL ULTRASOUND;  Surgeon: Collene Gobble, MD;  Location: [facility];  Service: Thoracic;  Laterality: N/A;    Family History  Problem Relation Age of Onset  . Colon polyps Brother   . Dementia Sister     Social History Social History   Tobacco Use  . Smoking status: Former Smoker    Packs/day: 1.00    Years: 33.00    Pack years: 33.00    Types: Cigarettes    Last attempt to quit: 04/17/2017    Years since quitting: 0.5  . Smokeless tobacco: Never Used  . Tobacco comment: May, 2018  Substance Use Topics  . Alcohol use: Yes    Comment: last 95  . Drug use: No    Allergies  Allergen Reactions  . Gabapentin Other (See Comments)    dizziness  . Statins Other (See Comments)    Muscle weakness    Current Outpatient Medications  Medication Sig Dispense Refill  . albuterol (PROVENTIL) (2.5 MG/3ML) 0.083% nebulizer solution Take 3 mLs (2.5 mg total) by nebulization every 6 (six) hours as needed for wheezing or shortness of breath. Dx:C34.12 120 mL 11  . ALPRAZolam (XANAX) 0.5 MG tablet Take 1 tablet (0.5 mg total) by mouth  at bedtime as needed for anxiety.  0  . amLODipine (NORVASC) 5 MG tablet Take 5 mg daily by mouth.  0  . calcium carbonate (TUMS - DOSED IN MG ELEMENTAL CALCIUM) 500 MG chewable tablet Chew 1 tablet by mouth daily as needed for indigestion or heartburn.    . cetirizine (ZYRTEC) 10 MG tablet TAKE 1 TABLET BY MOUTH AT BEDTIME (Patient taking differently: Take 10 mg daily as needed for allergies) 31 tablet 3  . Cholecalciferol (VITAMIN D) 2000 units CAPS Take 4,000 Units by mouth every evening.    . clotrimazole (LOTRIMIN) 1 % cream Apply 1 application topically 2 (two) times daily.    . fish oil-omega-3 fatty acids 1000 MG capsule Take 1 g by mouth every evening.     . Multiple Vitamins-Minerals (CENTRUM PO) Take 1 tablet by mouth every evening.     . pantoprazole (PROTONIX) 40 MG tablet Take 40 mg by mouth at bedtime.     . sodium chloride (OCEAN) 0.65 % SOLN nasal  spray Place 1 spray into both nostrils as needed for congestion.    Marland Kitchen umeclidinium-vilanterol (ANORO ELLIPTA) 62.5-25 MCG/INH AEPB Inhale 1 puff daily into the lungs. 60 each 3   No current facility-administered medications for this visit.    Facility-Administered Medications Ordered in Other Visits  Medication Dose Route Frequency Provider Last Rate Last Dose  . 0.9 %  sodium chloride infusion  250 mL Intravenous PRN Gold, Wayne E, PA-C      . levalbuterol (XOPENEX) nebulizer solution 0.63 mg  0.63 mg Nebulization Q6H PRN Gaye Pollack, MD   0.63 mg at 09/29/17 1646  . sodium chloride flush (NS) 0.9 % injection 3 mL  3 mL Intravenous Q12H Gold, Wayne E, PA-C      . sodium chloride flush (NS) 0.9 % injection 3 mL  3 mL Intravenous Q12H Gold, Wayne E, PA-C      . sodium chloride flush (NS) 0.9 % injection 3 mL  3 mL Intravenous PRN Gold, Wayne E, PA-C        Review of Systems  Constitutional: negative Eyes: negative Ears, nose, mouth, throat, and face: positive for hearing loss Respiratory: positive for cough and dyspnea on  exertion Cardiovascular: negative Gastrointestinal: negative Genitourinary:negative Integument/breast: negative Hematologic/lymphatic: negative Musculoskeletal:negative Neurological: negative Behavioral/Psych: negative Endocrine: negative Allergic/Immunologic: negative  Physical Exam  ZDG:LOVFI, healthy, no distress, well nourished and well developed SKIN: skin color, texture, turgor are normal, no rashes or significant lesions HEAD: Normocephalic, No masses, lesions, tenderness or abnormalities EYES: normal, PERRLA, Conjunctiva are pink and non-injected EARS: External ears normal, Canals clear OROPHARYNX:no exudate, no erythema and lips, buccal mucosa, and tongue normal  NECK: supple, no adenopathy, no JVD LYMPH:  no palpable lymphadenopathy, no hepatosplenomegaly LUNGS: clear to auscultation , and palpation HEART: regular rate & rhythm, no murmurs and no gallops ABDOMEN:abdomen soft, non-tender, normal bowel sounds and no masses or organomegaly BACK: Back symmetric, no curvature., No CVA tenderness EXTREMITIES:no joint deformities, effusion, or inflammation, no edema, no skin discoloration  NEURO: alert & oriented x 3 with fluent speech, no focal motor/sensory deficits  PERFORMANCE STATUS: ECOG 1  LABORATORY DATA: Lab Results  Component Value Date   WBC 10.4 (H) 11/08/2017   HGB 9.4 (L) 10/04/2017   HCT 33.6 (L) 11/08/2017   MCV 88.4 11/08/2017   PLT 414 (H) 11/08/2017      Chemistry      Component Value Date/Time   NA 134 (L) 10/04/2017 0253   K 3.5 10/04/2017 0253   CL 107 10/04/2017 0253   CO2 21 (L) 10/04/2017 0253   BUN 14 10/04/2017 0253   CREATININE 1.42 (H) 10/04/2017 0253   CREATININE 1.17 02/21/2012 1046      Component Value Date/Time   CALCIUM 7.3 (L) 10/04/2017 0253   ALKPHOS 109 09/28/2017 1919   AST 22 09/28/2017 1919   ALT 19 09/28/2017 1919   BILITOT 0.8 09/28/2017 1919       RADIOGRAPHIC STUDIES: Dg Chest 2 View  Result Date:  10/16/2017 CLINICAL DATA:  VATS left-side 09/19/2017. Post left upper lobectomy. EXAM: CHEST  2 VIEW COMPARISON:  10/05/2017 FINDINGS: Evidence of patient's recent left upper lobectomy with stable postsurgical findings of the left lung and hemithorax. Near resolution of the previously noted subcutaneous emphysema over the left chest. Right lung is clear. Remainder of the exam is unchanged. IMPRESSION: Stable post left upper lobectomy changes of the left lung. Electronically Signed   By: Marin Olp M.D.   On: 10/16/2017 14:38  ASSESSMENT: This is a very pleasant 77 years old white male recently diagnosed with a stage IA (T1a, N0, M0) non-small cell lung cancer, adenocarcinoma presented with left upper lobe lung nodule status post left upper lobectomy with lymph node dissection on September 19, 2017 under the care of Dr. Roxan Hockey. The molecular studies showed no actionable mutations and PDL 1 expression was negative.  PLAN: I had a lengthy discussion with the patient and his wife today about his current disease of stage, prognosis and treatment options. I explained to the patient that the 5-year survival for stage IA is around 80%. I also explained to the patient and his wife that there is no survival benefit for adjuvant systemic chemotherapy for patient with a stage IA. I recommended for the patient to continue on observation with repeat CT scan of the chest in 6 months. He will come back for follow-up visit at that time. The patient was advised to call immediately if he has any concerning symptoms in the interval. He was seen during the multidisciplinary thoracic oncology clinic today by medical oncology, thoracic navigator and social worker. The patient voices understanding of current disease status and treatment options and is in agreement with the current care plan.  All questions were answered. The patient knows to call the clinic with any problems, questions or concerns. We can certainly  see the patient much sooner if necessary.  Thank you so much for allowing me to participate in the care of Nathaniel Long. I will continue to follow up the patient with you and assist in his care.  I spent 40 minutes counseling the patient face to face. The total time spent in the appointment was 60 minutes.  Disclaimer: This note was dictated with voice recognition software. Similar sounding words can inadvertently be transcribed and may not be corrected upon review.   Eilleen Kempf November 08, 2017, 3:56 PM

## 2017-11-08 NOTE — Progress Notes (Signed)
[place] CSW Progress Note  CSW met patient and wife in Thoracic Clinic, no distress screen completed.  Patient is significantly hard of hearing, had difficulty hearing except when spoken to in quite loud voice; even when appears to hear, answers are somewhat tangential to topic.  Wife states she comes to his appointments due to his hearing loss and difficulty communicating.  Family is also raising two special needs sons, one of whom came into the exam room during the interaction.  Wife is responsible for caregiving of sons as well as attending to patient needs.  States she is well organized, carries calendar and ensures that all in the household attend appointments and "keep busy."  Patient likes to work in yard and keep physically active.  Wife does not anticipate any issues w caregiving or transportation needs, no financial concerns expressed.  CSW provided information on Support Center services and encouraged patient to contact as needed.  Edwyna Shell, LCSW Clinical Social Worker Phone:  [phone]

## 2017-12-10 ENCOUNTER — Other Ambulatory Visit: Payer: Self-pay | Admitting: Thoracic Surgery (Cardiothoracic Vascular Surgery)

## 2017-12-10 DIAGNOSIS — C3490 Malignant neoplasm of unspecified part of unspecified bronchus or lung: Secondary | ICD-10-CM

## 2017-12-10 NOTE — Progress Notes (Unsigned)
cxr 

## 2017-12-11 ENCOUNTER — Ambulatory Visit
Admission: RE | Admit: 2017-12-11 | Discharge: 2017-12-11 | Disposition: A | Discharge status: Home / Self Care | Payer: Medicare Other | Source: Ambulatory Visit | Attending: Thoracic Surgery (Cardiothoracic Vascular Surgery) | Admitting: Thoracic Surgery (Cardiothoracic Vascular Surgery)

## 2017-12-11 ENCOUNTER — Encounter: Payer: Self-pay | Admitting: Pulmonary Disease

## 2017-12-11 ENCOUNTER — Ambulatory Visit: Payer: Medicare Other | Admitting: Pulmonary Disease

## 2017-12-11 ENCOUNTER — Ambulatory Visit: Payer: Self-pay | Admitting: Pulmonary Disease

## 2017-12-11 ENCOUNTER — Ambulatory Visit (INDEPENDENT_AMBULATORY_CARE_PROVIDER_SITE_OTHER): Payer: Self-pay | Admitting: Thoracic Surgery (Cardiothoracic Vascular Surgery)

## 2017-12-11 VITALS — BP 124/78 | HR 78 | Ht 69.0 in | Wt 188.4 lb

## 2017-12-11 VITALS — BP 146/64 | HR 66 | Resp 20 | Ht 69.0 in | Wt 191.0 lb

## 2017-12-11 DIAGNOSIS — C3490 Malignant neoplasm of unspecified part of unspecified bronchus or lung: Secondary | ICD-10-CM

## 2017-12-11 DIAGNOSIS — Z902 Acquired absence of lung [part of]: Secondary | ICD-10-CM

## 2017-12-11 DIAGNOSIS — C3412 Malignant neoplasm of upper lobe, left bronchus or lung: Secondary | ICD-10-CM | POA: Diagnosis not present

## 2017-12-11 DIAGNOSIS — C3492 Malignant neoplasm of unspecified part of left bronchus or lung: Secondary | ICD-10-CM

## 2017-12-11 DIAGNOSIS — J449 Chronic obstructive pulmonary disease, unspecified: Secondary | ICD-10-CM

## 2017-12-11 NOTE — Patient Instructions (Signed)
I am glad you are doing well after the surgery Please work on going outside, exercising and getting into shape again Continue inhalers Follow-up in 6 months.

## 2017-12-11 NOTE — Progress Notes (Addendum)
Nathaniel Long    [id number]    07-30-1941  Primary Care Physician:Moreira, Carloyn Manner, MD  Referring Physician: [name], MD [id number] [place], [place] [address]  Chief complaint:  Follow up for  COPD Stage Ia lung adenocarcinoma status post resection.  HPI: 77 year old with history of GERD, hypertension.  He had an evaluation in the ED on 10/15 after a fall with blunt chest trauma.  CT scan of the chest did not reveal any fracture.  But there was a left upper lobe spiculated nodule and he has been referred to pulmonary for further evaluation. Sternal fracture is being managed conservatively.  Noted to have slightly elevated troponin during that admission.  He was evaluated by his cardiologist Dr. Einar Gip with no concern for cardiac ischemia.  He was on Plavix for carotid stenosis.  This has been held and he was started on aspirin.  He underwent cardiac stress test no evidence of ischemia.  He has 60-pack-year smoking history.  Quit 3 months ago.  Interim history: Patient had a bronchoscope on 09/05/17 with results showing non-small cell lung cancer.   Underwent resection and mediastinal lymph node dissection on September 19, 2017 by Dr. Roxan Hockey.  Outpatient Encounter Medications as of 12/11/2017  Medication Sig  . albuterol (PROVENTIL) (2.5 MG/3ML) 0.083% nebulizer solution Take 3 mLs (2.5 mg total) by nebulization every 6 (six) hours as needed for wheezing or shortness of breath. Dx:C34.12  . ALPRAZolam (XANAX) 0.5 MG tablet Take 1 tablet (0.5 mg total) by mouth at bedtime as needed for anxiety.  Marland Kitchen amLODipine (NORVASC) 5 MG tablet Take 5 mg daily by mouth.  . calcium carbonate (TUMS - DOSED IN MG ELEMENTAL CALCIUM) 500 MG chewable tablet Chew 1 tablet by mouth daily as needed for indigestion or heartburn.  . cetirizine (ZYRTEC) 10 MG tablet TAKE 1 TABLET BY MOUTH AT BEDTIME (Patient taking differently: Take 10 mg daily as needed for allergies)  . Cholecalciferol (VITAMIN  D) 2000 units CAPS Take 4,000 Units by mouth every evening.  . clotrimazole (LOTRIMIN) 1 % cream Apply 1 application topically 2 (two) times daily.  . fish oil-omega-3 fatty acids 1000 MG capsule Take 1 g by mouth every evening.   Marland Kitchen LIVALO 4 MG TABS Take 1 tablet by mouth daily.  . Multiple Vitamins-Minerals (CENTRUM PO) Take 1 tablet by mouth every evening.   . pantoprazole (PROTONIX) 40 MG tablet Take 40 mg by mouth at bedtime.   . sodium chloride (OCEAN) 0.65 % SOLN nasal spray Place 1 spray into both nostrils as needed for congestion.  . traMADol (ULTRAM) 50 MG tablet Take by mouth every 6 (six) hours as needed.  . umeclidinium-vilanterol (ANORO ELLIPTA) 62.5-25 MCG/INH AEPB Inhale 1 puff daily into the lungs.   Facility-Administered Encounter Medications as of 12/11/2017  Medication  . 0.9 %  sodium chloride infusion  . levalbuterol (XOPENEX) nebulizer solution 0.63 mg  . sodium chloride flush (NS) 0.9 % injection 3 mL  . sodium chloride flush (NS) 0.9 % injection 3 mL  . sodium chloride flush (NS) 0.9 % injection 3 mL    Allergies as of 12/11/2017 - Review Complete 12/11/2017  Allergen Reaction Noted  . Gabapentin Other (See Comments) 04/26/2017  . Statins Other (See Comments) 01/04/2017    Past Medical History:  Diagnosis Date  . Anxiety   . Carotid artery stenosis   . Chronic lower back pain   . CKD (chronic kidney disease)   .  Complication of anesthesia    "can't pee when I come out of OR" (07/18/2017)  . Contact with chainsaw as cause of accidental injury 07/16/2017   sternal fracture with bilateral anterior fifth and sixth rib fractures /notes 07/16/2017  . COPD (chronic obstructive pulmonary disease) ([place])   . Dyspnea   . GERD (gastroesophageal reflux disease)   . Hard of hearing   . High cholesterol   . Hypertension   . Thoracic ascending aortic aneurysm [facility])     Past Surgical History:  Procedure Laterality Date  . ABDOMINAL AORTOGRAM W/LOWER EXTREMITY N/A  05/01/2017   Procedure: Abdominal Aortogram w/Lower Extremity;  Surgeon: Adrian Prows, MD;  Location: [place] [facility];  Service: Cardiovascular;  Laterality: N/A;  . BACK SURGERY    . FINGER REPLANTATION Left    "ring finger"  . HEMORRHOID SURGERY    . POSTERIOR LUMBAR FUSION  1986  . VIDEO ASSISTED THORACOSCOPY (VATS)/ LOBECTOMY Left 09/19/2017   Procedure: LEFT VIDEO ASSISTED THORACOSCOPY (VATS)/ LEFT UPPER LOBECTOMY, LYMPH NODE DISECTION;  Surgeon: Melrose Nakayama, MD;  Location: [facility];  Service: Thoracic;  Laterality: Left;  Marland Kitchen VIDEO BRONCHOSCOPY WITH ENDOBRONCHIAL NAVIGATION N/A 09/05/2017   Procedure: VIDEO BRONCHOSCOPY WITH ENDOBRONCHIAL NAVIGATION;  Surgeon: Collene Gobble, MD;  [facility];  Service: Thoracic;  Laterality: N/A;  . VIDEO BRONCHOSCOPY WITH ENDOBRONCHIAL ULTRASOUND N/A 09/05/2017   Procedure: VIDEO BRONCHOSCOPY WITH ENDOBRONCHIAL ULTRASOUND;  Surgeon: Collene Gobble, MD;  [facility];  Service: Thoracic;  Laterality: N/A;    Family History  Problem Relation Age of Onset  . Colon polyps Brother   . Dementia Sister     Social History   Socioeconomic History  . Marital status: Married    Spouse name: Not on file  . Number of children: Not on file  . Years of education: Not on file  . Highest education level: Not on file  Social Needs  . Financial resource strain: Not hard at all  . Food insecurity - worry: Never true  . Food insecurity - inability: Never true  . Transportation needs - medical: No  . Transportation needs - non-medical: No  Occupational History  . Not on file  Tobacco Use  . Smoking status: Former Smoker    Packs/day: 1.00    Years: 33.00    Pack years: 33.00    Types: Cigarettes    Last attempt to quit: 04/17/2017    Years since quitting: 0.6  . Smokeless tobacco: Never Used  . Tobacco comment: May, 2018  Substance and Sexual Activity  . Alcohol use: Yes    Comment: last 95  . Drug use: No  . Sexual activity: Not  Currently  Other Topics Concern  . Not on file  Social History Narrative  . Not on file    Review of systems: Review of Systems  Constitutional: Negative for fever and chills.  HENT: Negative.   Eyes: Negative for blurred vision.  Respiratory: as per HPI  Cardiovascular: Negative for chest pain and palpitations.  Gastrointestinal: Negative for vomiting, diarrhea, blood per rectum. Genitourinary: Negative for dysuria, urgency, frequency and hematuria.  Musculoskeletal: Negative for myalgias, back pain and joint pain.  Skin: Negative for itching and rash.  Neurological: Negative for dizziness, tremors, focal weakness, seizures and loss of consciousness.  Endo/Heme/Allergies: Negative for environmental allergies.  Psychiatric/Behavioral: Negative for depression, suicidal ideas and hallucinations.  All other systems reviewed and are negative.  Physical Exam: Blood pressure 124/78, pulse 78, height 5'  9" (1.753 m), weight 188 lb 6.4 oz (85.5 kg), SpO2 98 %. Gen:      No acute distress HEENT:  EOMI, sclera anicteric Neck:     No masses; no thyromegaly Lungs:    Clear to auscultation bilaterally; normal respiratory effort CV:         Regular rate and rhythm; no murmurs Abd:      + bowel sounds; soft, non-tender; no palpable masses, no distension Ext:    No edema; adequate peripheral perfusion Skin:      Warm and dry; no rash Neuro: alert and oriented x 3 Psych: normal mood and affect  Data Reviewed: CT chest 07/16/17-upper sternal fracture with moderate hematoma in the anterior mediastinum, moderate emphysema 16 mm spiculated left upper lobe mass.  Ascending aorta aneurysm, lower lobe consolidation.  PET scan 08/10/17-right upper lobe 1.6 cm mass which is hypermetabolic.  Low FDG uptake in the subcarinal lymph node. New small bilateral effusion. I have reviewed all images personally.  Lung biopsy 09/05/17-.  Left upper lobe brushing and biopsy is positive for non-small cell lung  cancer, adenocarcinoma Needle aspiration of lymph nodes 11 L, 7, 4R-negative for malignancy.  PFTs 09/12/17 FVC 3.89 [90%], FEV1 2.22 (71%], F/F 57, TLC 75%, DLCO 40% Moderate obstruction with minimal restriction and severe diffusion defect.  Assessment:  IA adenocarcinoma s/p resection Stable post resection.  Seen by Dr. Julien Nordmann and no additional chemotherapy advised We will continue on surveillance CTs.   COPD PFTs reviewed showing moderate obstruction with severe diffusion defect.  He was walked in the clinic today with no desaturations noted. Stable on anoro and Proventil.   Advised him to start exercise at home.   Plan/Recommendations: - Continue Anoro, Proventil Marshell Garfinkel MD [facility] Pager [phone] 12/11/2017, 10:02 AM  CC: [name], MD   Addendum: Received note and labs from primary care 10/09/2017 CBC-WBC 13.9, hemoglobin 10, platelets 894, eos 2.9% Comprehensive metabolic panel-within normal limits.  Marshell Garfinkel MD [facility] 02/13/2018, 4:56 PM

## 2017-12-11 NOTE — Progress Notes (Signed)
[address]       [place] [address]             [id number]       HPI: Mr. Nathaniel Long returns for 51-month follow-up visit  Nathaniel Long is a 77 year old gentleman who was working with a chainsaw back in July.  It kicked back and hit him in the chest.  He had bilateral rib fractures.  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  The nodule was hypermetabolic on PET.  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  I did a thoracoscopic left upper lobectomy on 09/19/2017.  Final pathology was T1, N0, stage Ia.  He did have a prolonged air leak postoperatively.  He went home with a chest tube came back when the tube clogged up.  A pigtail catheter was placed in the chest tube was removed.  The pigtail was removed once the air leak resolved.  I saw him in the office on 10/16/2017.  He was doing well at that time.  Past Medical History:  Diagnosis Date  . Anxiety   . Carotid artery stenosis   . Chronic lower back pain   . CKD (chronic kidney disease)   . Complication of anesthesia    "can't pee when I come out of OR" (07/18/2017)  . Contact with chainsaw as cause of accidental injury 07/16/2017   sternal fracture with bilateral anterior fifth and sixth rib fractures /notes 07/16/2017  . COPD (chronic obstructive pulmonary disease) (Nathaniel Long)   . Dyspnea   . GERD (gastroesophageal reflux disease)   . Hard of hearing   . High cholesterol   . Hypertension   . Thoracic ascending aortic aneurysm [facility])     Current Outpatient Medications  Medication Sig Dispense Refill  . albuterol (PROVENTIL) (2.5 MG/3ML) 0.083% nebulizer solution Take 3 mLs (2.5 mg total) by nebulization every 6 (six) hours as needed for wheezing or shortness of breath. Dx:C34.12 120 mL 11  . ALPRAZolam (XANAX) 0.5 MG tablet Take 1 tablet (0.5 mg total) by mouth at bedtime as needed for anxiety.  0  . amLODipine (NORVASC) 5 MG tablet Take 5  mg daily by mouth.  0  . calcium carbonate (TUMS - DOSED IN MG ELEMENTAL CALCIUM) 500 MG chewable tablet Chew 1 tablet by mouth daily as needed for indigestion or heartburn.    . cetirizine (ZYRTEC) 10 MG tablet TAKE 1 TABLET BY MOUTH AT BEDTIME (Patient taking differently: Take 10 mg daily as needed for allergies) 31 tablet 3  . Cholecalciferol (VITAMIN D) 2000 units CAPS Take 4,000 Units by mouth every evening.    . clotrimazole (LOTRIMIN) 1 % cream Apply 1 application topically 2 (two) times daily.    . fish oil-omega-3 fatty acids 1000 MG capsule Take 1 g by mouth every evening.     Marland Kitchen LIVALO 4 MG TABS Take 1 tablet by mouth daily.    . Multiple Vitamins-Minerals (CENTRUM PO) Take 1 tablet by mouth every evening.     . pantoprazole (PROTONIX) 40 MG tablet Take 40 mg by mouth at bedtime.     . sodium chloride (OCEAN) 0.65 % SOLN nasal spray Place 1 spray into both nostrils as needed for congestion.    . traMADol (ULTRAM) 50 MG tablet Take by mouth every 6 (six) hours as needed.    . umeclidinium-vilanterol (ANORO ELLIPTA) 62.5-25 MCG/INH AEPB Inhale  1 puff daily into the lungs. 60 each 3   No current facility-administered medications for this visit.    Facility-Administered Medications Ordered in Other Visits  Medication Dose Route Frequency Provider Last Rate Last Dose  . 0.9 %  sodium chloride infusion  250 mL Intravenous PRN Gold, Wayne E, PA-C      . levalbuterol (XOPENEX) nebulizer solution 0.63 mg  0.63 mg Nebulization Q6H PRN Nathaniel Pollack, MD   0.63 mg at 09/29/17 1646  . sodium chloride flush (NS) 0.9 % injection 3 mL  3 mL Intravenous Q12H Gold, Wayne E, PA-C      . sodium chloride flush (NS) 0.9 % injection 3 mL  3 mL Intravenous Q12H Gold, Wayne E, PA-C      . sodium chloride flush (NS) 0.9 % injection 3 mL  3 mL Intravenous PRN Gold, Wayne E, PA-C        Physical Exam BP (!) 146/64   Pulse 66   Resp 20   Ht 5\' 9"  (1.753 m)   Wt 191 lb (86.6 kg)   SpO2 95% Comment: RA   BMI 28.74 kg/m  77 year old man in no acute distress Alert and oriented x3 with no focal motor deficits Profoundly hard of hearing Lungs diminished at left base, otherwise clear Incisions well-healed Cardiac regular rate and rhythm normal S1 and S2  Diagnostic Tests: CHEST - 2 VIEW  COMPARISON:  Chest x-ray 10/16/2017.  FINDINGS: Mediastinum and hilar structures are normal. Postsurgical changes left lung. Left base pleural-parenchymal thickening again noted consistent scarring. Pleural-based densities on the right are most likely related pleural-parenchymal scarring as well. Similar findings noted on prior exams. Heart size normal. No pneumothorax.  IMPRESSION: Postsurgical changes left lung. Bibasal pleuroparenchymal thickening consistent with scarring. No acute abnormality.   Electronically Signed   By: Nathaniel Long  Register   On: 12/11/2017 13:02 I personally reviewed the chest x-ray images and concur with the findings noted above.  Impression: Nathaniel Long is a 77 year old gentleman with a history of tobacco abuse who had a stage IA non-small cell carcinoma resected with a left upper lobectomy about 3 months ago.  He is doing well at this time with minimal discomfort.  He is not requiring any pain medication.  He is not having any problems with shortness of breath.  He saw Nathaniel Long.  No adjuvant therapy was recommended.  He will get follow-up at 21-month intervals for the first 2 years and then annually after that.  Plan: Return in 9 months with a PA and lateral chest x-ray for his one-year follow-up  Nathaniel Nakayama, MD [facility] [phone]

## 2017-12-17 DIAGNOSIS — I150 Renovascular hypertension: Secondary | ICD-10-CM | POA: Diagnosis present

## 2017-12-17 DIAGNOSIS — I701 Atherosclerosis of renal artery: Secondary | ICD-10-CM | POA: Diagnosis present

## 2017-12-17 NOTE — H&P (Addendum)
OFFICE VISIT NOTES COPIED TO EPIC FOR DOCUMENTATION  . History of Present Illness Nathaniel Page MD; 11/22/2017 8:33 PM) Patient words: Last OV 05/11/2017; 6 month f/u.  The patient is a 77 year old male who presents for a Follow-up for Hypertension.  Additional reasons for visit:  Follow-up for Carotid artery stenosis is described as the following: Caucasian male with history of hypertension, hyperlipidemia, peripheral arterial disease, recent chest wall trauma in Oct 2018 and found to have malignant left lung mass incidentally and he underwent left thorocotomy and lobectomy and has done well. who presents here for follow-up of hypertension, asymptomatic carotid disease. He is hard of hearing. He is still tolerating Livalo without side effects.  Due to worsening symptoms of leg pain, hearing undergone peripheral arteriogram on 05/01/2017 revealing Right renal artery 80% stenosis. Right PT and left AT occluded below knee, but otherwise mild disease and was recommended evaluation for pseudo-claudication. He is now scheduled for steroid injection.  He has had severe hypotension initially when he was admitted in Oct 2018 with chest wall trauma and acute renal failure. There is recent worsening of renal function and fluctuating BP. Otherwisse doing well and has recuperated well since lobectomy and has remainded abstinant from cigarettes. No TIA or neurologic symptoms.   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 10 mm subcarinal mediastinal lymph node shows FDG uptake, suspicious for lymph node metastasis. 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 This exam reveals mildly decreased perfusion of the left lower extremity, noted at the post tibial artery level. Moderately abnormal waveforms at the ankle. RABI 0.81 and LABI 0.85. Compared to LE arterial duplex 08/05/12 RABI 0.73, LABI 0.67. Bilateral carotid artery stenosis (C58.85)  Carotid artery duplex 09/11/2017: Minimal stenosis in the right internal carotid artery (1-15%). Stenosis in the left internal carotid artery (16-49%). Mild stenosis in the left common carotid artery (<50%). Mild stenosis in the left external carotid artery (<50%). Left vertebral artery waveform shows high resistance and may suggest hemodynamically significant stenosis. Antegrade right vertebral artery flow. Follow up in one year is appropriate if clinically indicated. Compared to the study done on 03/08/2017, left ICA stenosis severity reduced from >50%. Otherwise no significant change. Hyperlipidemia, group A (E78.00)  Shortness of breath at rest (R06.02)  Lexiscan myoview stress test 08/31/2017: 1. Pharmacologic stress testing was performed with intravenous administration of .4 mg of Lexiscan. 2. Stress EKG is non diagnostic for ischemia as it is a pharmacologic stress. 3. Gated SPECT images reveal normal myocardial thickening and wall motion. The left ventricular ejection fraction was calculated to be 60%. SPECT images demonstrate small perfusion abnormality of mild intensity in the basal inferior and mid inferior myocardial wall(s) on the stress images with mild reversibility on rest images. This could represent diaphragmatic attenuation. 4. This is a low risk study. 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 Potassium 4.9, creatinine 1.9,  60/49, hemoglobin A1c 6.4%. 04/26/2017: Glucose 113, creatinine 1.57, potassium 4.3, BMP otherwise normal. CBC normal. INR 1.0, prothrombin  time 10.0. 04/19/2017: RBC 4.17, hemoglobin 13.6, CBC normal. Potassium 4.1, creatinine 1.9, CMP otherwise normal. EGFR 42. Hemoglobin A1c 6%. 01/09/2017: CBC normal. Potassium 4.1, creatinine 1.7, EGFR 48, CMP otherwise normal. Cholesterol 138, triglycerides 153, HDL 32, LDL 75. Labs 06/21/2016: Sodium 137, potassium 4.0, BUN 18, creatinine 1.43, eGFR 54 mL. CMP otherwise normal. Total cholesterol 135, triglycerides 99, HDL 35, LDL 91. A1c 6.0%. PSA normal. 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 Right PT and left AT occluded below knee, but otherwise mild disease.  Allergies Nathaniel Long; Nov 26, 2017 2:03 PM) Simvastatin *ANTIHYPERLIPIDEMICS* [04/14/2013 01:56 PM]: Myalgias and arthralgia  Family History Nathaniel Long; 11/26/17 2:03 PM) Father died at age 53 of liver cancer. Mother died at age 26. 5 siblings (2 deceased).  Mother  Deceased. at age 72 Father  Deceased. at age 68, from [place] 2  1 Deceased 1 living, older Sister 2  Both Deceased  Social History Nathaniel Long; November 26, 2017 2:03 PM) Current tobacco use  Former smoker. 1 ppd smoker, has not had one since the ctah procedure Non Drinker/No Alcohol Use  Marital status  Married. Number of Children  7. (3 biological, 2 adopted)  Past Surgical History Nathaniel Long; 2017/11/26 2:03 PM) Back surgery in 1986.   Medication History Nathaniel Page, MD; November 26, 2017 3:02 PM) ALPRAZolam (0.5MG Tablet, 1 (one) Tablet Oral three times daily, as needed for anxiety for smoking cessation., Taken starting 10/08/2017) Active. AmLODIPine Besylate (5MG Tablet, 1 (one) Oral daily, Taken starting  09/06/2017) Active. Clopidogrel Bisulfate (75MG Tablet, 1 (one) Tablet Tablet Oral daily, Taken starting 03/29/2017) Active. Pantoprazole Sodium (40MG Tablet DR, 1 (one) Tablet DR Tablet D Oral daily, Taken starting 03/29/2017) Active. Livalo (4MG Tablet,  (one half) Tablet Tablet Oral daily, Taken starting 11/26/2016) Active. (P/A approved on 06/16/2013-10/01/2013) Cetirizine HCl (10MG Tablet, 1 Tablet Tablet Tablet Tablet Oral daily as needed, Taken starting 04/01/2015) Active. Fish Oil (1000MG Capsule, 1 Oral daily) Active. Multivitamins (1 Oral daily) Active. Vitamin D3 (1000UNIT Tablet, 2 Oral daily) Active. Gabapentin (100MG Capsule, 2 Oral at bedtime) Active. Medications Reconciled (Verbally; Pt states no changes)  Diagnostic Studies History Cheri Kearns; Nov 26, 2017 8:34 AM) Carotid Doppler  09/11/2017: Minimal stenosis in the right internal carotid artery (1-15%). Stenosis in the left internal carotid artery (16-49%). Mild stenosis in the left common carotid artery (<50%). Mild stenosis in the left external carotid artery (<50%). Left vertebral artery waveform shows high resistance and may suggest hemodynamically significant stenosis. Antegrade right vertebral artery flow. Follow up in one year is appropriate if clinically indicated. Compared to the study done on 03/08/2017, left ICA stenosis severity reduced from >50%. Otherwise no significant change. Nuclear stress test  08/31/2017: 1. Pharmacologic stress testing was performed with intravenous administration of .4 mg of Lexiscan. 2. Stress EKG is non diagnostic for ischemia as it is a pharmacologic stress. 3. Gated SPECT images reveal normal myocardial thickening and wall motion. The left ventricular ejection fraction was calculated to be 60%. SPECT images demonstrate small perfusion abnormality of mild intensity in the basal inferior and mid inferior myocardial wall(s) on the stress images with mild reversibility on rest  images. This could represent diaphragmatic attenuation. 4. This is a low risk study.    Review of Systems Nathaniel Page MD; Nov 26, 2017 8:34 PM) General Present- Feeling well. Not Present- Anorexia, Fatigue and Fever. HEENT Present- Hearing Loss. Not Present-  Headache. Respiratory Not Present- Cough and Decreased Exercise Tolerance. Cardiovascular Present- Claudications (right leg pseudo claudication). Not Present- Chest Pain, Edema, Orthopnea, Paroxysmal Nocturnal Dyspnea and Shortness of Breath. Gastrointestinal Not Present- Change in Bowel Habits, Constipation and Nausea. Musculoskeletal Present- Back Pain and Joint Pain (back pain and sciatica right leg). Neurological Not Present- Focal Neurological Symptoms. Endocrine Not Present- Appetite Changes, Cold Intolerance and Heat Intolerance. Hematology Not Present- Anemia, Petechiae and Prolonged Bleeding. All other systems negative  Vitals Nathaniel Long; 11/22/2017 2:10 PM) 11/22/2017 2:08 PM Weight: 185.31 lb Height: 71in Body Surface Area: 2.04 m Body Mass Index: 25.85 kg/m  Pulse: 89 (Regular)  P.OX: 89% (Room air) BP: 163/85 (Sitting, Left Arm, Standard)       Physical Exam Nathaniel Page MD; 11/22/2017 2:58 PM) General Mental Status-Alert. General Appearance-Cooperative, Appears stated age, Not in acute distress. Orientation-Oriented X3. Build & Nutrition-Well built and Well nourished.  Head and Neck Thyroid Gland Characteristics - no palpable nodules, no palpable enlargement.  Chest and Lung Exam Inspection Chest Wall - Scar - Left Posterior Chest Wall(thorocotomy for lobectomy). Palpation Tender - No chest wall tenderness. Auscultation Breath sounds - Prolonged expiration - Both Lung Fields. Adventitious sounds - Expiratory wheeze - Both Lung Fields.  Cardiovascular Inspection Jugular vein - Right - No Distention. Auscultation Heart Sounds - S1 WNL, S2 WNL and No gallop present.  Murmurs & Other Heart Sounds: Murmur - Location - Sternal Border - Right. Timing - Early systolic. Grade - II/VI.  Abdomen Palpation/Percussion Normal exam - Non Tender and No hepatosplenomegaly. Auscultation Normal exam - Bowel sounds normal.  Peripheral Vascular Lower Extremity Inspection - Bilateral - No Pigmentation, No Ulcerations, no Varicose ulcers. Palpation - Edema - Bilateral - No edema. Femoral pulse - Bilateral - 2+(loud bruit bilateral). Popliteal pulse - Bilateral - 2+. Dorsalis pedis pulse - Left - Absent. Right - Normal. Posterior tibial pulse - Left - Normal. Right - Absent. Carotid arteries - Bilateral-Carotid bruit(left more pronounced). Abdomen-Epigastric bruit present, No prominent abdominal aortic pulsation.  Neurologic Motor-Grossly intact without any focal deficits.  Musculoskeletal Global Assessment Left Lower Extremity - normal range of motion without pain. Right Lower Extremity - normal range of motion without pain.    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 Right PT and left AT occluded below knee, but otherwise mild disease. 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 PVC. No significant change from EKG 09/07/2016. Current Plans Started Metoprolol Succinate ER 25MG, 1 Tablet daily, after a meal, #90, 90 days starting 11/22/2017, Ref. x3. 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 Potassium 4.9, creatinine 1.9, 60/49, hemoglobin A1c 6.4%.  04/26/2017: Glucose  113, creatinine 1.57, potassium 4.3, BMP otherwise normal. CBC normal. INR 1.0, prothrombin time 10.0.  04/19/2017: RBC 4.17, hemoglobin 13.6, CBC normal. Potassium 4.1, creatinine 1.9, CMP otherwise normal. EGFR 42. Hemoglobin A1c 6%.  01/09/2017: CBC normal. Potassium 4.1, creatinine 1.7, EGFR 48, CMP otherwise normal. Cholesterol 138, triglycerides 153, HDL 32, LDL 75.  Labs 06/21/2016: Sodium 137, potassium 4.0, BUN 18, creatinine 1.43, eGFR 54 mL. CMP otherwise normal. Total cholesterol 135, triglycerides 99, HDL 35, LDL 91. A1c  6.0%. PSA normal.  Note:- Recommendations:  Nathaniel Long is a pleasant Caucasian male with history of hypertension, hyperlipidemia, peripheral arterial disease, recent chest wall trauma in Oct 2018 and found to have malignant left lung mass incidentally and he underwent left thorocotomy and lobectomy and has done well.  Due to worsening symptoms of leg pain, hearing undergone peripheral arteriogram on 05/01/2017 revealing right renal artery 80% stenosis and right PT and left AT occluded below the knee but otherwise mild disease and was recommended evaluation for pseudo-claudication. .  Since angiography on 05/01/17, patient quit smoking. There has been gradual worsening renal function and BP is now uncontrolled. Increase Amlodipine back to 5 mg daily and restart Metoprolol succinate 25 mg daily. BP was well controlled on this regimen until recent and also his renal function continues to deteriorate, hence we should consider renal artery angioplasty. With regard to pseudo-claudication, he has been scheduled for spinal injection, would schedule renal angiogram after this. I have reviewed patient's labs/records and updated them.  CC Dr. [name].  Labs: 12/13/2017: Glucose 104, creatinine 1.24, EGFR 56, potassium 4.8, BMP otherwise normal.  WBC 13.6, platelets elevated at 492, CBC otherwise normal.  INR 1.0, prothrombin time 10.5.  10/09/2017: RBC 3.32, hemoglobin 10,  hematocrit 32.8, CBC normal.  Potassium 4.9, creatinine 1.9, 60/49, hemoglobin A1c 6.4%.   Signed by Nathaniel Page, MD (11/22/2017 8:39 PM)

## 2017-12-18 ENCOUNTER — Encounter (HOSPITAL_COMMUNITY)
Admission: RE | Disposition: A | Discharge status: Home / Self Care | Payer: Self-pay | Source: Ambulatory Visit | Attending: Cardiology

## 2017-12-18 ENCOUNTER — Encounter (HOSPITAL_COMMUNITY): Payer: Self-pay | Admitting: Cardiology

## 2017-12-18 ENCOUNTER — Ambulatory Visit (HOSPITAL_COMMUNITY)
Admission: RE | Admit: 2017-12-18 | Discharge: 2017-12-18 | Disposition: A | Discharge status: Home / Self Care | Payer: Medicare Other | Source: Ambulatory Visit | Attending: Cardiology | Admitting: Cardiology

## 2017-12-18 DIAGNOSIS — N183 Chronic kidney disease, stage 3 (moderate): Secondary | ICD-10-CM | POA: Diagnosis not present

## 2017-12-18 DIAGNOSIS — I70213 Atherosclerosis of native arteries of extremities with intermittent claudication, bilateral legs: Secondary | ICD-10-CM | POA: Diagnosis not present

## 2017-12-18 DIAGNOSIS — E7800 Pure hypercholesterolemia, unspecified: Secondary | ICD-10-CM | POA: Diagnosis not present

## 2017-12-18 DIAGNOSIS — Z87891 Personal history of nicotine dependence: Secondary | ICD-10-CM | POA: Insufficient documentation

## 2017-12-18 DIAGNOSIS — I6523 Occlusion and stenosis of bilateral carotid arteries: Secondary | ICD-10-CM | POA: Insufficient documentation

## 2017-12-18 DIAGNOSIS — I701 Atherosclerosis of renal artery: Secondary | ICD-10-CM | POA: Diagnosis present

## 2017-12-18 DIAGNOSIS — I129 Hypertensive chronic kidney disease with stage 1 through stage 4 chronic kidney disease, or unspecified chronic kidney disease: Secondary | ICD-10-CM | POA: Diagnosis present

## 2017-12-18 DIAGNOSIS — I150 Renovascular hypertension: Secondary | ICD-10-CM | POA: Diagnosis present

## 2017-12-18 HISTORY — PX: RENAL ANGIOGRAPHY: CATH118260

## 2017-12-18 SURGERY — RENAL ANGIOGRAPHY
Anesthesia: LOCAL | Laterality: Bilateral

## 2017-12-18 MED ORDER — SODIUM CHLORIDE 0.9 % IV SOLN: INTRAVENOUS | Status: DC

## 2017-12-18 MED ORDER — SODIUM CHLORIDE 0.9 % IV SOLN
INTRAVENOUS | Status: DC
  Administered 2017-12-18 (×2): via INTRAVENOUS

## 2017-12-18 MED ORDER — SODIUM CHLORIDE 0.9% FLUSH: 3.0000 mL | Freq: Two times a day (BID) | INTRAVENOUS | Status: DC

## 2017-12-18 MED ORDER — LABETALOL HCL 5 MG/ML IV SOLN: 10.0000 mg | INTRAVENOUS | Status: DC | PRN

## 2017-12-18 MED ORDER — HYDRALAZINE HCL 20 MG/ML IJ SOLN
INTRAMUSCULAR | Status: AC
  Filled 2017-12-18: qty 1

## 2017-12-18 MED ORDER — IODIXANOL 320 MG/ML IV SOLN
INTRAVENOUS | Status: DC | PRN
  Administered 2017-12-18: 25 mL via INTRAVENOUS

## 2017-12-18 MED ORDER — ACETAMINOPHEN 325 MG PO TABS: 650.0000 mg | ORAL_TABLET | ORAL | Status: DC | PRN

## 2017-12-18 MED ORDER — LIDOCAINE HCL (PF) 1 % IJ SOLN
INTRAMUSCULAR | Status: DC | PRN
  Administered 2017-12-18: 15 mL

## 2017-12-18 MED ORDER — SODIUM CHLORIDE 0.9 % IV SOLN: 250.0000 mL | INTRAVENOUS | Status: DC | PRN

## 2017-12-18 MED ORDER — HEPARIN SODIUM (PORCINE) 1000 UNIT/ML IJ SOLN
INTRAMUSCULAR | Status: AC
  Filled 2017-12-18: qty 1

## 2017-12-18 MED ORDER — AMLODIPINE BESYLATE 10 MG PO TABS: 10.0000 mg | ORAL_TABLET | Freq: Every day | ORAL | Status: DC

## 2017-12-18 MED ORDER — HEPARIN (PORCINE) IN NACL 2-0.9 UNIT/ML-% IJ SOLN
INTRAMUSCULAR | Status: AC
  Filled 2017-12-18: qty 1000

## 2017-12-18 MED ORDER — SODIUM CHLORIDE 0.9% FLUSH: 3.0000 mL | INTRAVENOUS | Status: DC | PRN

## 2017-12-18 MED ORDER — LIDOCAINE HCL 1 % IJ SOLN
INTRAMUSCULAR | Status: AC
  Filled 2017-12-18: qty 20

## 2017-12-18 MED ORDER — HYDRALAZINE HCL 20 MG/ML IJ SOLN
INTRAMUSCULAR | Status: DC | PRN
  Administered 2017-12-18 (×2): 10 mg via INTRAVENOUS

## 2017-12-18 MED ORDER — ONDANSETRON HCL 4 MG/2ML IJ SOLN: 4.0000 mg | Freq: Four times a day (QID) | INTRAMUSCULAR | Status: DC | PRN

## 2017-12-18 MED ORDER — METOPROLOL SUCCINATE ER 25 MG PO TB24: 25.0000 mg | ORAL_TABLET | Freq: Every day | ORAL | Status: DC

## 2017-12-18 MED ORDER — HEPARIN SODIUM (PORCINE) 1000 UNIT/ML IJ SOLN
INTRAMUSCULAR | Status: DC | PRN
  Administered 2017-12-18: 7000 [IU] via INTRAVENOUS

## 2017-12-18 MED ORDER — HYDRALAZINE HCL 20 MG/ML IJ SOLN: 5.0000 mg | INTRAMUSCULAR | Status: DC | PRN

## 2017-12-18 MED ORDER — HEPARIN (PORCINE) IN NACL 2-0.9 UNIT/ML-% IJ SOLN
INTRAMUSCULAR | Status: AC | PRN
  Administered 2017-12-18 (×2): 500 mL

## 2017-12-18 MED ORDER — AMLODIPINE BESYLATE 10 MG PO TABS: 5.0000 mg | ORAL_TABLET | Freq: Every day | ORAL | 6 refills | Status: DC

## 2017-12-18 SURGICAL SUPPLY — 8 items
GUIDE CATH VISTA IMA 6F (CATHETERS) ×1 IMPLANT
KIT ENCORE 26 ADVANTAGE (KITS) ×1 IMPLANT
KIT MICROPUNCTURE NIT STIFF (SHEATH) ×1 IMPLANT
KIT PV (KITS) ×2 IMPLANT
SHEATH AVANTI 11CM 6FR (SHEATH) ×1 IMPLANT
TRANSDUCER W/STOPCOCK (MISCELLANEOUS) ×2 IMPLANT
TRAY PV CATH (CUSTOM PROCEDURE TRAY) ×2 IMPLANT
WIRE J 3MM .035X145CM (WIRE) ×1 IMPLANT

## 2017-12-18 NOTE — Discharge Instructions (Signed)

## 2017-12-18 NOTE — Interval H&P Note (Signed)
History and Physical Interval Note:  12/18/2017 7:30 AM  Nathaniel Long  has presented today for surgery, with the diagnosis of resistent hypertension, right renal artery stenosis  The various methods of treatment have been discussed with the patient and family. After consideration of risks, benefits and other options for treatment, the patient has consented to  Procedure(s): RENAL ANGIOGRAPHY (N/A) as a surgical intervention .  The patient's history has been reviewed, patient examined, no change in status, stable for surgery.  I have reviewed the patient's chart and labs.  Questions were answered to the patient's satisfaction.     [place]

## 2017-12-18 NOTE — Progress Notes (Signed)
Up and walked and tolerated well; right groin stable, no bleeding or hematoma 

## 2017-12-19 MED FILL — Heparin Sodium (Porcine) 2 Unit/ML in Sodium Chloride 0.9%: INTRAMUSCULAR | Qty: 1000 | Status: AC

## 2017-12-19 MED FILL — Lidocaine HCl Local Inj 1%: INTRAMUSCULAR | Qty: 20 | Status: AC

## 2018-05-03 ENCOUNTER — Ambulatory Visit (HOSPITAL_COMMUNITY)
Admission: RE | Admit: 2018-05-03 | Discharge: 2018-05-03 | Disposition: A | Discharge status: Home / Self Care | Payer: Medicare Other | Source: Ambulatory Visit | Attending: Internal Medicine | Admitting: Internal Medicine

## 2018-05-03 ENCOUNTER — Inpatient Hospital Stay: Payer: Medicare Other | Attending: Internal Medicine

## 2018-05-03 DIAGNOSIS — J439 Emphysema, unspecified: Secondary | ICD-10-CM | POA: Diagnosis not present

## 2018-05-03 DIAGNOSIS — Z902 Acquired absence of lung [part of]: Secondary | ICD-10-CM | POA: Insufficient documentation

## 2018-05-03 DIAGNOSIS — I700 Atherosclerosis of aorta: Secondary | ICD-10-CM | POA: Insufficient documentation

## 2018-05-03 DIAGNOSIS — C3412 Malignant neoplasm of upper lobe, left bronchus or lung: Secondary | ICD-10-CM | POA: Insufficient documentation

## 2018-05-03 DIAGNOSIS — C3490 Malignant neoplasm of unspecified part of unspecified bronchus or lung: Secondary | ICD-10-CM | POA: Diagnosis present

## 2018-05-03 DIAGNOSIS — I2510 Atherosclerotic heart disease of native coronary artery without angina pectoris: Secondary | ICD-10-CM | POA: Diagnosis not present

## 2018-05-03 DIAGNOSIS — I10 Essential (primary) hypertension: Secondary | ICD-10-CM | POA: Diagnosis not present

## 2018-05-03 DIAGNOSIS — J449 Chronic obstructive pulmonary disease, unspecified: Secondary | ICD-10-CM | POA: Diagnosis not present

## 2018-05-03 LAB — CBC WITH DIFFERENTIAL (CANCER CENTER ONLY)
Basophils Absolute: 0 10*3/uL (ref 0.0–0.1)
Basophils Relative: 1 %
EOS ABS: 0.2 10*3/uL (ref 0.0–0.5)
EOS PCT: 3 %
HCT: 39.8 % (ref 38.4–49.9)
HEMOGLOBIN: 13.6 g/dL (ref 13.0–17.1)
Lymphocytes Relative: 43 %
Lymphs Abs: 2.9 10*3/uL (ref 0.9–3.3)
MCH: 32 pg (ref 27.2–33.4)
MCHC: 34.3 g/dL (ref 32.0–36.0)
MCV: 93.3 fL (ref 79.3–98.0)
Monocytes Absolute: 0.6 10*3/uL (ref 0.1–0.9)
Monocytes Relative: 9 %
NEUTROS PCT: 44 %
Neutro Abs: 3 10*3/uL (ref 1.5–6.5)
PLATELETS: 315 10*3/uL (ref 140–400)
RBC: 4.27 MIL/uL (ref 4.20–5.82)
RDW: 13 % (ref 11.0–14.6)
WBC: 6.7 10*3/uL (ref 4.0–10.3)

## 2018-05-03 LAB — CMP (CANCER CENTER ONLY)
ALK PHOS: 96 U/L (ref 38–126)
ALT: 15 U/L (ref 0–44)
AST: 18 U/L (ref 15–41)
Albumin: 3.9 g/dL (ref 3.5–5.0)
Anion gap: 10 (ref 5–15)
BILIRUBIN TOTAL: 0.9 mg/dL (ref 0.3–1.2)
BUN: 20 mg/dL (ref 8–23)
CO2: 21 mmol/L — ABNORMAL LOW (ref 22–32)
CREATININE: 1.59 mg/dL — AB (ref 0.61–1.24)
Calcium: 8.8 mg/dL — ABNORMAL LOW (ref 8.9–10.3)
Chloride: 108 mmol/L (ref 98–111)
GFR, EST AFRICAN AMERICAN: 47 mL/min — AB (ref >60)
GFR, EST NON AFRICAN AMERICAN: 41 mL/min — AB (ref >60)
Glucose, Bld: 113 mg/dL — ABNORMAL HIGH (ref 70–99)
Potassium: 3.9 mmol/L (ref 3.5–5.1)
Sodium: 139 mmol/L (ref 135–145)
TOTAL PROTEIN: 7.1 g/dL (ref 6.5–8.1)

## 2018-05-08 ENCOUNTER — Inpatient Hospital Stay: Payer: Medicare Other | Admitting: Internal Medicine

## 2018-05-08 ENCOUNTER — Encounter: Payer: Self-pay | Admitting: Internal Medicine

## 2018-05-08 ENCOUNTER — Telehealth: Payer: Self-pay | Admitting: Internal Medicine

## 2018-05-08 ENCOUNTER — Other Ambulatory Visit: Payer: Self-pay

## 2018-05-08 VITALS — BP 131/69 | HR 53 | Temp 97.7°F | Resp 18 | Ht 69.0 in | Wt 197.8 lb

## 2018-05-08 DIAGNOSIS — Z902 Acquired absence of lung [part of]: Secondary | ICD-10-CM | POA: Diagnosis not present

## 2018-05-08 DIAGNOSIS — C3412 Malignant neoplasm of upper lobe, left bronchus or lung: Secondary | ICD-10-CM

## 2018-05-08 DIAGNOSIS — J449 Chronic obstructive pulmonary disease, unspecified: Secondary | ICD-10-CM

## 2018-05-08 DIAGNOSIS — I10 Essential (primary) hypertension: Secondary | ICD-10-CM

## 2018-05-08 DIAGNOSIS — C3490 Malignant neoplasm of unspecified part of unspecified bronchus or lung: Secondary | ICD-10-CM

## 2018-05-08 NOTE — Progress Notes (Signed)
[facility] [phone]   [phone]  OFFICE PROGRESS NOTE  [name], MD [address] [place] [address]  DIAGNOSIS: stage IA (T1a, N0, M0) non-small cell lung cancer, adenocarcinoma presented with left upper lobe lung nodule.  PRIOR THERAPY: Status post left upper lobectomy with lymph node dissection on September 19, 2017 under the care of Dr. Roxan Hockey.  CURRENT THERAPY: Observation.  INTERVAL HISTORY: TYREASE VANDEBERG 77 y.o. male returns to the clinic today for follow-up visit accompanied by his wife.  The patient is feeling fine today with no specific complaints except for shortness of breath with exertion.  He denied having any significant chest pain, cough or hemoptysis.  He denied having any weight loss or night sweats.  He has no nausea, vomiting, diarrhea or constipation.  He had repeat CT scan of the chest performed recently and is here for evaluation and discussion of his discuss results.  MEDICAL HISTORY: Past Medical History:  Diagnosis Date  . Anxiety   . Carotid artery stenosis   . Chronic lower back pain   . CKD (chronic kidney disease)   . Complication of anesthesia    "can't pee when I come out of OR" (07/18/2017)  . Contact with chainsaw as cause of accidental injury 07/16/2017   sternal fracture with bilateral anterior fifth and sixth rib fractures /notes 07/16/2017  . COPD (chronic obstructive pulmonary disease) ([facility])   . Dyspnea   . GERD (gastroesophageal reflux disease)   . Hard of hearing   . High cholesterol   . Hypertension   . Thoracic ascending aortic aneurysm (HCC)     ALLERGIES:  is allergic to gabapentin and statins.  MEDICATIONS:  Current Outpatient Medications  Medication Sig Dispense Refill  . albuterol (PROVENTIL) (2.5 MG/3ML) 0.083% nebulizer solution Take 3 mLs (2.5 mg total) by nebulization every 6 (six) hours as needed for wheezing or shortness of breath. Dx:C34.12 120 mL 11  .  ALPRAZolam (XANAX) 0.5 MG tablet Take 1 tablet (0.5 mg total) by mouth at bedtime as needed for anxiety.  0  . amLODipine (NORVASC) 10 MG tablet Take 0.5 tablets (5 mg total) by mouth daily. 60 tablet 6  . calcium carbonate (TUMS - DOSED IN MG ELEMENTAL CALCIUM) 500 MG chewable tablet Chew 2 tablets by mouth daily as needed for indigestion or heartburn.     . cetirizine (ZYRTEC) 10 MG tablet TAKE 1 TABLET BY MOUTH AT BEDTIME (Patient taking differently: Take 10 mg by mouth daily as needed for allergies) 31 tablet 3  . Cholecalciferol (VITAMIN D) 2000 units CAPS Take 4,000 Units by mouth every evening.    . fish oil-omega-3 fatty acids 1000 MG capsule Take 1 g by mouth every evening.     Marland Kitchen LIVALO 4 MG TABS Take 2 mg by mouth daily.     . metoprolol succinate (TOPROL-XL) 25 MG 24 hr tablet Take 25 mg by mouth daily.    . Multiple Vitamins-Minerals (CENTRUM PO) Take 1 tablet by mouth every evening.     . pantoprazole (PROTONIX) 40 MG tablet Take 40 mg by mouth at bedtime.     . sodium chloride (OCEAN) 0.65 % SOLN nasal spray Place 1 spray into both nostrils as needed for congestion.    . traMADol (ULTRAM) 50 MG tablet Take 50 mg by mouth daily.     Marland Kitchen umeclidinium-vilanterol (ANORO ELLIPTA) 62.5-25 MCG/INH AEPB Inhale 1 puff daily into the lungs. 60 each 3  No current facility-administered medications for this visit.    Facility-Administered Medications Ordered in Other Visits  Medication Dose Route Frequency Provider Last Rate Last Dose  . 0.9 %  sodium chloride infusion  250 mL Intravenous PRN Gold, Wayne E, PA-C      . levalbuterol (XOPENEX) nebulizer solution 0.63 mg  0.63 mg Nebulization Q6H PRN Gaye Pollack, MD   0.63 mg at 09/29/17 1646  . sodium chloride flush (NS) 0.9 % injection 3 mL  3 mL Intravenous Q12H Gold, Wayne E, PA-C      . sodium chloride flush (NS) 0.9 % injection 3 mL  3 mL Intravenous Q12H Gold, Wayne E, PA-C      . sodium chloride flush (NS) 0.9 % injection 3 mL  3 mL  Intravenous PRN [name], PA-C        SURGICAL HISTORY:  Past Surgical History:  Procedure Laterality Date  . ABDOMINAL AORTOGRAM W/LOWER EXTREMITY N/A 05/01/2017   Procedure: Abdominal Aortogram w/Lower Extremity;  Surgeon: Adrian Prows, MD;  Location: [facility];  Service: Cardiovascular;  Laterality: N/A;  . BACK SURGERY    . FINGER REPLANTATION Left    "ring finger"  . HEMORRHOID SURGERY    . POSTERIOR LUMBAR FUSION  1986  . RENAL ANGIOGRAPHY Bilateral 12/18/2017   Procedure: RENAL ANGIOGRAPHY;  Surgeon: Nigel Mormon, MD;  Location: [facility];  Service: Cardiovascular;  Laterality: Bilateral;  . VIDEO ASSISTED THORACOSCOPY (VATS)/ LOBECTOMY Left 09/19/2017   Procedure: LEFT VIDEO ASSISTED THORACOSCOPY (VATS)/ LEFT UPPER LOBECTOMY, LYMPH NODE DISECTION;  Surgeon: Melrose Nakayama, MD;  Location: [facility];  Service: Thoracic;  Laterality: Left;  Marland Kitchen VIDEO BRONCHOSCOPY WITH ENDOBRONCHIAL NAVIGATION N/A 09/05/2017   Procedure: VIDEO BRONCHOSCOPY WITH ENDOBRONCHIAL NAVIGATION;  Surgeon: Collene Gobble, MD;  Location: [facility];  Service: Thoracic;  Laterality: N/A;  . VIDEO BRONCHOSCOPY WITH ENDOBRONCHIAL ULTRASOUND N/A 09/05/2017   Procedure: VIDEO BRONCHOSCOPY WITH ENDOBRONCHIAL ULTRASOUND;  Surgeon: Collene Gobble, MD;  [facility];  Service: Thoracic;  Laterality: N/A;    REVIEW OF SYSTEMS:  A comprehensive review of systems was negative except for: Respiratory: positive for dyspnea on exertion   PHYSICAL EXAMINATION: General appearance: alert, cooperative and no distress Head: Normocephalic, without obvious abnormality, atraumatic Neck: no adenopathy, no JVD, supple, symmetrical, trachea midline and thyroid not enlarged, symmetric, no tenderness/mass/nodules Lymph nodes: Cervical, supraclavicular, and axillary nodes normal. Resp: clear to auscultation bilaterally Back: symmetric, no curvature. ROM normal. No CVA tenderness. Cardio: regular rate and rhythm,  S1, S2 normal, no murmur, click, rub or gallop GI: soft, non-tender; bowel sounds normal; no masses,  no organomegaly Extremities: extremities normal, atraumatic, no cyanosis or edema  ECOG PERFORMANCE STATUS: 1 - Symptomatic but completely ambulatory  Blood pressure 131/69, pulse (!) 53, temperature 97.7 F (36.5 C), temperature source Oral, resp. rate 18, height 5\' 9"  (1.753 m), weight 197 lb 12.8 oz (89.7 kg), SpO2 98 %.  LABORATORY DATA: Lab Results  Component Value Date   WBC 6.7 05/03/2018   HGB 13.6 05/03/2018   HCT 39.8 05/03/2018   MCV 93.3 05/03/2018   PLT 315 05/03/2018      Chemistry      Component Value Date/Time   NA 139 05/03/2018 0948   K 3.9 05/03/2018 0948   CL 108 05/03/2018 0948   CO2 21 (L) 05/03/2018 0948   BUN 20 05/03/2018 0948   CREATININE 1.59 (H) 05/03/2018 0948   CREATININE 1.17 02/21/2012 1046  Component Value Date/Time   CALCIUM 8.8 (L) 05/03/2018 0948   ALKPHOS 96 05/03/2018 0948   AST 18 05/03/2018 0948   ALT 15 05/03/2018 0948   BILITOT 0.9 05/03/2018 0948       RADIOGRAPHIC STUDIES: Ct Chest Wo Contrast  Result Date: 05/03/2018 CLINICAL DATA:  Follow-up lung cancer. EXAM: CT CHEST WITHOUT CONTRAST TECHNIQUE: Multidetector CT imaging of the chest was performed following the standard protocol without IV contrast. COMPARISON:  09/28/2017 FINDINGS: Cardiovascular: The heart size appears within normal limits. No pericardial effusion. Aortic atherosclerosis. Calcification in the LAD and RCA coronary arteries noted. Mediastinum/Nodes: No enlarged mediastinal or axillary lymph nodes. Thyroid gland, trachea, and esophagus demonstrate no significant findings. Lungs/Pleura: Status post left upper lobectomy. There are moderate changes of emphysema. Small volume of loculated pleural fluid noted within the posteromedial left lung base. No suspicious pulmonary nodule or mass identified. Upper Abdomen: No acute abnormality. Musculoskeletal: Spondylosis  identified within the lower thoracic spine. No aggressive lytic or sclerotic bone lesions. Healing right anterior rib fractures. IMPRESSION: 1. Status post left upper lobectomy. No findings of recurrent or metastatic disease. 2. Aortic Atherosclerosis (ICD10-I70.0) and Emphysema (ICD10-J43.9). 3. LAD and RCA coronary artery atherosclerotic calcifications. Electronically Signed   By: Kerby Moors M.D.   On: 05/03/2018 13:10    ASSESSMENT AND PLAN: This is a very pleasant 77 years old white male with a stage Ia non-small cell lung cancer status post left upper lobectomy with lymph node dissection and he is currently on observation.  The patient is feeling fine with no concerning complaints. Repeat CT scan of the chest showed no concerning findings for disease recurrence. I discussed the scan results with the patient and his wife and recommended for him to continue on observation with repeat CT scan of the chest in 6 months. The patient was advised to call immediately if he has any concerning symptoms in the interval. The patient voices understanding of current disease status and treatment options and is in agreement with the current care plan.  All questions were answered. The patient knows to call the clinic with any problems, questions or concerns. We can certainly see the patient much sooner if necessary.  I spent 10 minutes counseling the patient face to face. The total time spent in the appointment was 15 minutes.  Disclaimer: This note was dictated with voice recognition software. Similar sounding words can inadvertently be transcribed and may not be corrected upon review.

## 2018-05-08 NOTE — Telephone Encounter (Signed)
Scheduled appt per 8/7 los - gave patient AVS and calender per los. Central radiology to contact pt with ct scan .

## 2018-06-10 ENCOUNTER — Encounter: Payer: Self-pay | Admitting: Pulmonary Disease

## 2018-06-10 ENCOUNTER — Ambulatory Visit: Payer: Medicare Other | Admitting: Pulmonary Disease

## 2018-06-10 VITALS — BP 110/80 | HR 50 | Ht 70.0 in | Wt 198.2 lb

## 2018-06-10 DIAGNOSIS — Z23 Encounter for immunization: Secondary | ICD-10-CM

## 2018-06-10 DIAGNOSIS — J449 Chronic obstructive pulmonary disease, unspecified: Secondary | ICD-10-CM

## 2018-06-10 NOTE — Progress Notes (Signed)
Nathaniel Long    [id number]    Sep 14, 1941  Primary Care Physician:Long, Nathaniel Manner, MD  Referring Physician: [name], MD [address] [place], [place] [address]  Chief complaint:  Follow up for  COPD Stage Ia lung adenocarcinoma status post resection- 09/19/17.  HPI: 77 year old with history of GERD, hypertension.  He had an evaluation in the ED on 10/15 after a fall with blunt chest trauma.  CT scan of the chest did not reveal any fracture.  But there was a left upper lobe spiculated nodule and he has been referred to pulmonary for further evaluation. Sternal fracture is being managed conservatively.  Noted to have slightly elevated troponin during that admission.  He was evaluated by his cardiologist Dr. Einar Long with no concern for cardiac ischemia.  He was on Plavix for carotid stenosis.  This has been held and he was started on aspirin.  He underwent cardiac stress test no evidence of ischemia.  Patient had a bronchoscope by Dr. Lamonte Long on 09/05/17 with results showing non-small cell lung cancer.   Underwent resection and mediastinal lymph node dissection on September 19, 2017 by Dr. Roxan Long. He has 60-pack-year smoking history.  Quit in 2018.  Interim history: Breathing is doing well.  He has the Anoro inhaler but is using it only intermittently as he does not have symptoms Has followed up with Dr. Julien Long.  CT scan in August shows no disease recurrence. He is considering getting hip replacement.  Physical Exam: Blood pressure 110/80, pulse (!) 50, height 5\' 10"  (1.778 m), weight 198 lb 3.2 oz (89.9 kg), SpO2 96 %. Gen:      No acute distress HEENT:  EOMI, sclera anicteric Neck:     No masses; no thyromegaly Lungs:    Clear to auscultation bilaterally; normal respiratory effort CV:         Regular rate and rhythm; no murmurs Abd:      + bowel sounds; soft, non-tender; no palpable masses, no distension Ext:    No edema; adequate peripheral perfusion Skin:      Warm  and dry; no rash Neuro: alert and oriented x 3 Psych: normal mood and affect  Data Reviewed: Imaging CT chest 07/16/17-upper sternal fracture with moderate hematoma in the anterior mediastinum, moderate emphysema. 16 mm spiculated left upper lobe mass.  Ascending aorta aneurysm, lower lobe consolidation.   PET scan 08/10/17-right upper lobe 1.6 cm mass which is hypermetabolic.  Low FDG uptake in the subcarinal lymph node. New small bilateral effusion.  CT scan 05/03/2018- postoperative changes of left upper lobectomy.  No findings of recurrent disease.  Coronary atherosclerosis, emphysema. I have reviewed the images personally.  PFTs 09/12/17 FVC 3.89 [90%], FEV1 2.22 (71%], F/F 57, TLC 75%, DLCO 40% Moderate obstruction with minimal restriction and severe diffusion defect.  Path Lung biopsy 09/05/17-.  Left upper lobe brushing and biopsy is positive for non-small cell lung cancer, adenocarcinoma Needle aspiration of lymph nodes 11 L, 7, 4R-negative for malignancy.  Labs CBC 05/03/2018-WBC 6.7, eos 3%, absolute eosinophil count 200  Assessment:  IA adenocarcinoma s/p resection Stable post resection.  Seen by Dr. Julien Long and no additional chemotherapy advised Continue surveillance CT   COPD PFTs reviewed showing moderate obstruction with severe diffusion defect.  No desaturations on exertion Continues on Anoro.  Encouraged him to use this daily Use albuterol as needed  Health maintenance Flu vaccine today 09/26/2017- Pneumovax   Plan/Recommendations: - Continue Anoro, Proventil   Follow-up  in 1 year.  Outpatient Encounter Medications as of 06/10/2018  Medication Sig  . albuterol (PROVENTIL) (2.5 MG/3ML) 0.083% nebulizer solution Take 3 mLs (2.5 mg total) by nebulization every 6 (six) hours as needed for wheezing or shortness of breath. Dx:C34.12  . ALPRAZolam (XANAX) 0.5 MG tablet Take 1 tablet (0.5 mg total) by mouth at bedtime as needed for anxiety.  Marland Kitchen amLODipine (NORVASC) 10  MG tablet Take 0.5 tablets (5 mg total) by mouth daily.  . calcium carbonate (TUMS - DOSED IN MG ELEMENTAL CALCIUM) 500 MG chewable tablet Chew 2 tablets by mouth daily as needed for indigestion or heartburn.   . cetirizine (ZYRTEC) 10 MG tablet TAKE 1 TABLET BY MOUTH AT BEDTIME (Patient taking differently: Take 10 mg by mouth daily as needed for allergies)  . fish oil-omega-3 fatty acids 1000 MG capsule Take 1 g by mouth every evening.   Marland Kitchen LIVALO 4 MG TABS Take 2 mg by mouth daily.   . metoprolol succinate (TOPROL-XL) 25 MG 24 hr tablet Take 25 mg by mouth daily.  . Multiple Vitamins-Minerals (CENTRUM PO) Take 1 tablet by mouth every evening.   . pantoprazole (PROTONIX) 40 MG tablet Take 40 mg by mouth at bedtime.   . sodium chloride (OCEAN) 0.65 % SOLN nasal spray Place 1 spray into both nostrils as needed for congestion.  . traMADol (ULTRAM) 50 MG tablet Take 50 mg by mouth daily.   Marland Kitchen umeclidinium-vilanterol (ANORO ELLIPTA) 62.5-25 MCG/INH AEPB Inhale 1 puff daily into the lungs.   Facility-Administered Encounter Medications as of 06/10/2018  Medication  . 0.9 %  sodium chloride infusion  . levalbuterol (XOPENEX) nebulizer solution 0.63 mg  . sodium chloride flush (NS) 0.9 % injection 3 mL  . sodium chloride flush (NS) 0.9 % injection 3 mL  . sodium chloride flush (NS) 0.9 % injection 3 mL    Allergies as of 06/10/2018 - Review Complete 06/10/2018  Allergen Reaction Noted  . Gabapentin Other (See Comments) 04/26/2017  . Statins Other (See Comments) 01/04/2017    Past Medical History:  Diagnosis Date  . Anxiety   . Carotid artery stenosis   . Chronic lower back pain   . CKD (chronic kidney disease)   . Complication of anesthesia    "can't pee when I come out of OR" (07/18/2017)  . Contact with chainsaw as cause of accidental injury 07/16/2017   sternal fracture with bilateral anterior fifth and sixth rib fractures /notes 07/16/2017  . COPD (chronic obstructive pulmonary disease)  ([facility])   . Dyspnea   . GERD (gastroesophageal reflux disease)   . Hard of hearing   . High cholesterol   . Hypertension   . Thoracic ascending aortic aneurysm [facility])     Past Surgical History:  Procedure Laterality Date  . ABDOMINAL AORTOGRAM W/LOWER EXTREMITY N/A 05/01/2017   Procedure: Abdominal Aortogram w/Lower Extremity;  Surgeon: Adrian Prows, MD;  Location: [facility];  Service: Cardiovascular;  Laterality: N/A;  . BACK SURGERY    . FINGER REPLANTATION Left    "ring finger"  . HEMORRHOID SURGERY    . POSTERIOR LUMBAR FUSION  1986  . RENAL ANGIOGRAPHY Bilateral 12/18/2017   Procedure: RENAL ANGIOGRAPHY;  Surgeon: Nigel Mormon, MD;  Location: [facility];  Service: Cardiovascular;  Laterality: Bilateral;  . VIDEO ASSISTED THORACOSCOPY (VATS)/ LOBECTOMY Left 09/19/2017   Procedure: LEFT VIDEO ASSISTED THORACOSCOPY (VATS)/ LEFT UPPER LOBECTOMY, LYMPH NODE DISECTION;  Surgeon: Melrose Nakayama, MD;  Location: [facility];  Service: Thoracic;  Laterality: Left;  Marland Kitchen VIDEO BRONCHOSCOPY WITH ENDOBRONCHIAL NAVIGATION N/A 09/05/2017   Procedure: VIDEO BRONCHOSCOPY WITH ENDOBRONCHIAL NAVIGATION;  Surgeon: Collene Gobble, MD;  [facility]: [facility];  Service: Thoracic;  Laterality: N/A;  . VIDEO BRONCHOSCOPY WITH ENDOBRONCHIAL ULTRASOUND N/A 09/05/2017   Procedure: VIDEO BRONCHOSCOPY WITH ENDOBRONCHIAL ULTRASOUND;  Surgeon: Collene Gobble, MD;  Location: [facility];  Service: Thoracic;  Laterality: N/A;    Family History  Problem Relation Age of Onset  . Colon polyps Brother   . Dementia Sister     Social History   Socioeconomic History  . Marital status: Married    Spouse name: Not on file  . Number of children: Not on file  . Years of education: Not on file  . Highest education level: Not on file  Occupational History  . Not on file  Social Needs  . Financial resource strain: Not hard at all  . Food insecurity:    Worry: Never true    Inability: Never true  .  Transportation needs:    Medical: No    Non-medical: No  Tobacco Use  . Smoking status: Former Smoker    Packs/day: 1.00    Years: 33.00    Pack years: 33.00    Types: Cigarettes    Last attempt to quit: 04/17/2017    Years since quitting: 1.1  . Smokeless tobacco: Never Used  . Tobacco comment: May, 2018  Substance and Sexual Activity  . Alcohol use: Yes    Comment: last 95  . Drug use: No  . Sexual activity: Not Currently  Lifestyle  . Physical activity:    Days per week: 0 days    Minutes per session: 0 min  . Stress: Not at all  Relationships  . Social connections:    Talks on phone: Never    Gets together: More than three times a week    Attends religious service: Never    Active member of club or organization: No    Attends meetings of clubs or organizations: Never    Relationship status: Married  . Intimate partner violence:    Fear of current or ex partner: No    Emotionally abused: No    Physically abused: No    Forced sexual activity: No  Other Topics Concern  . Not on file  Social History Narrative  . Not on file   Review of systems: Review of Systems  Constitutional: Negative for fever and chills.  HENT: Negative.   Eyes: Negative for blurred vision.  Respiratory: as per HPI  Cardiovascular: Negative for chest pain and palpitations.  Gastrointestinal: Negative for vomiting, diarrhea, blood per rectum. Genitourinary: Negative for dysuria, urgency, frequency and hematuria.  Musculoskeletal: Negative for myalgias, back pain and joint pain.  Skin: Negative for itching and rash.  Neurological: Negative for dizziness, tremors, focal weakness, seizures and loss of consciousness.  Endo/Heme/Allergies: Negative for environmental allergies.  Psychiatric/Behavioral: Negative for depression, suicidal ideas and hallucinations.  All other systems reviewed and are negative.  Marshell Garfinkel MD [facility] 06/10/2018, 11:52 AM  CC: Nathaniel Panda, MD

## 2018-06-10 NOTE — Patient Instructions (Signed)
We will give you a flu vaccination today I am glad you are doing well Use the Anoro regularly for your breathing Follow-up in 1 year with nurse practitioner.

## 2018-06-30 IMAGING — CT CT IMAGE GUIDED DRAINAGE BY PERCUTANEOUS CATHETER
1 of 2 series · 15 of 32 positions shown, 19 images · non-contrast
Comparison: none

INDICATION: Loculated left hydropneumothorax

[Series 2: i-spiral 5.0 b40f · axial · 0.49mm/px · z∈[+1255,+1413]mm · 15 of 51 slices shown, 19 images]
[im 3/51  soft-tissue]
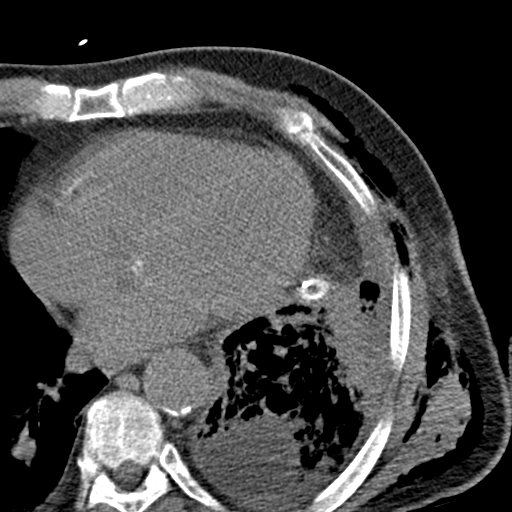
[im 3/51  bone]
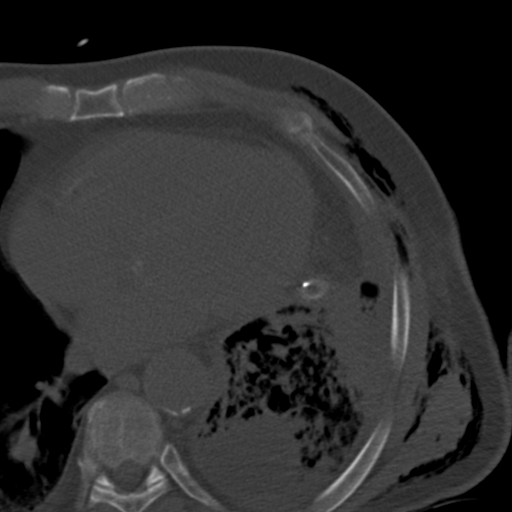
[im 7/51  soft-tissue]
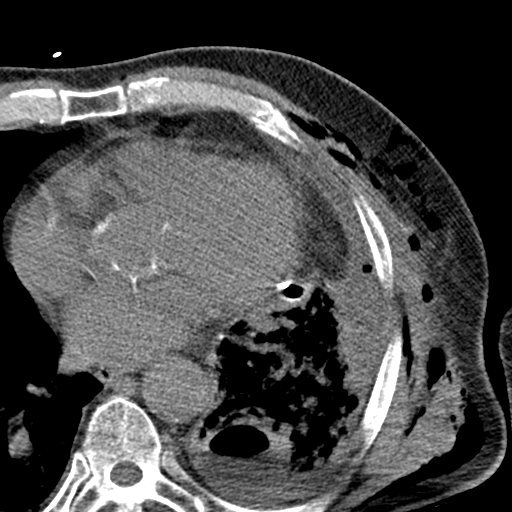
[im 12/51  soft-tissue]
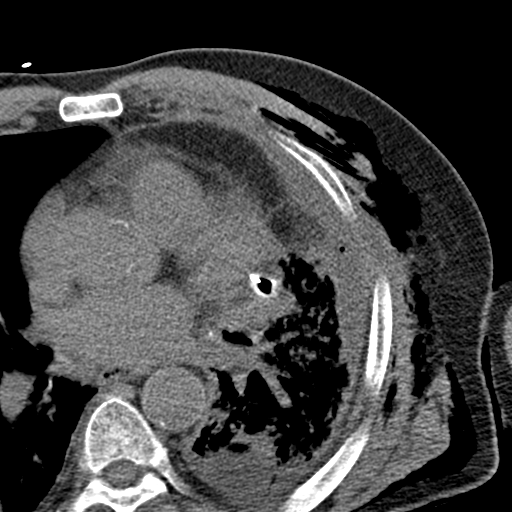
[im 14/51  soft-tissue]
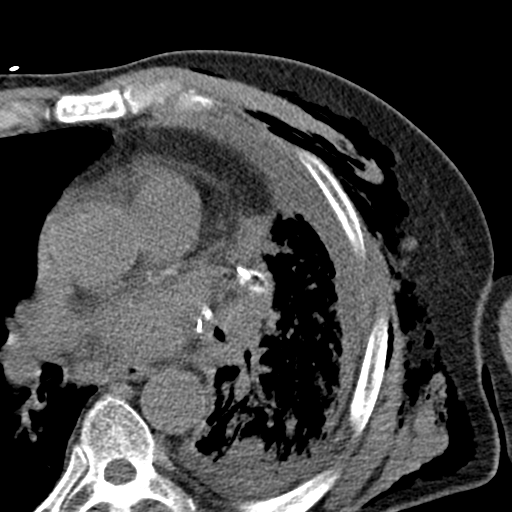
[im 19/51  soft-tissue]
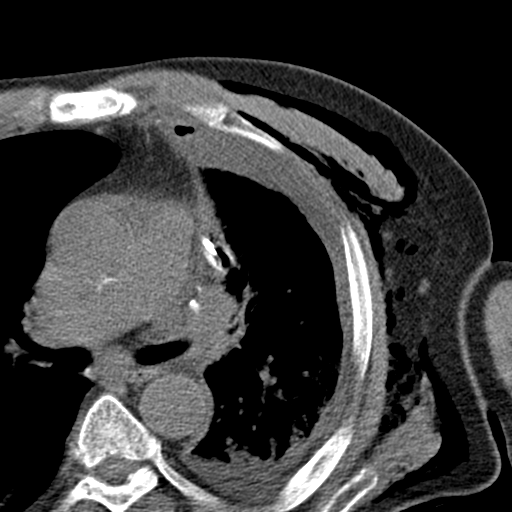
[im 21/51  soft-tissue]
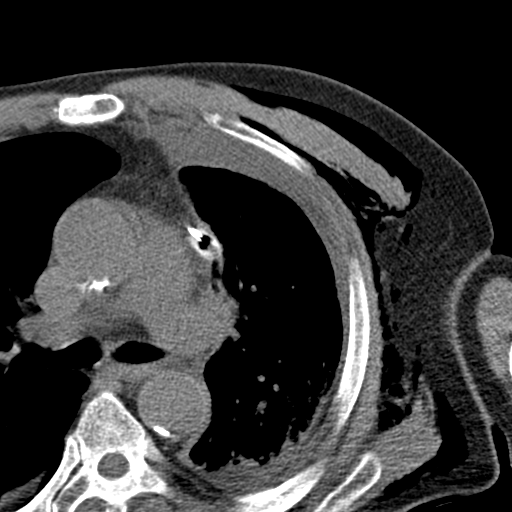
[im 26/51  soft-tissue]
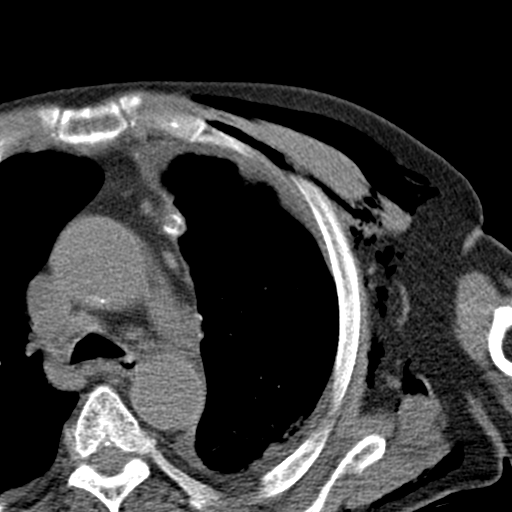
[im 30/51  soft-tissue]
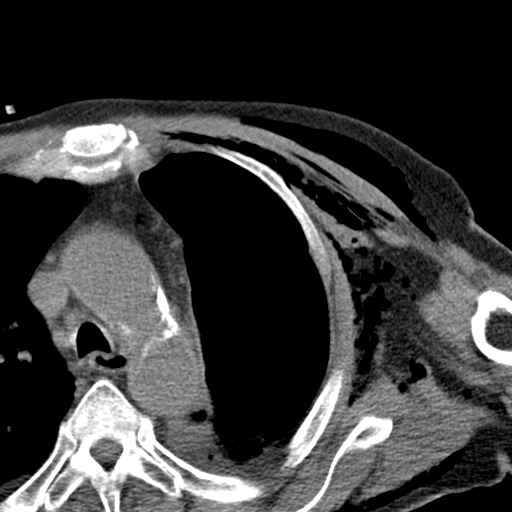
[im 32/51  soft-tissue]
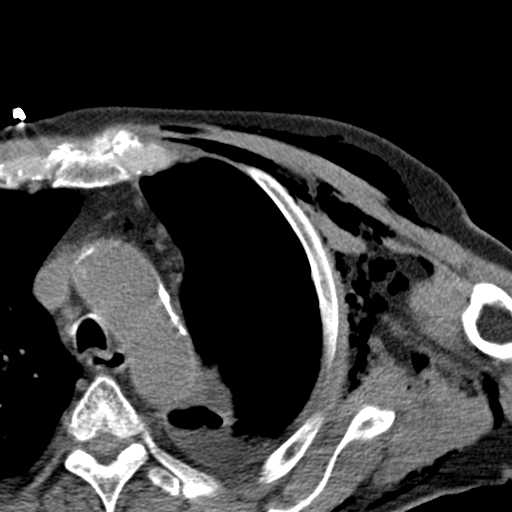
[im 32/51  bone]
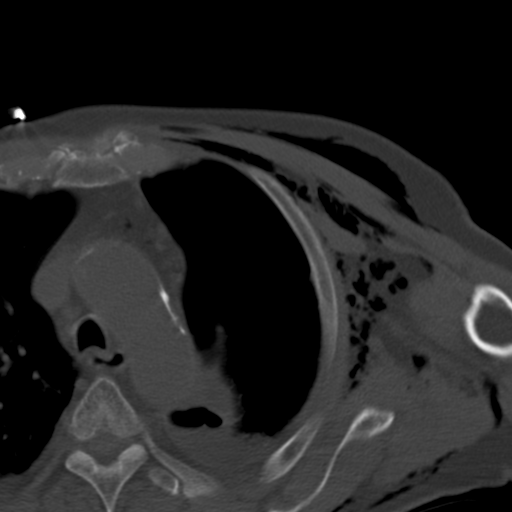
[im 37/51  soft-tissue]
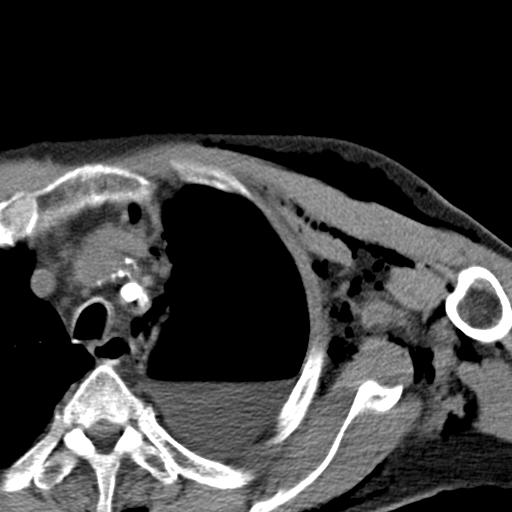
[im 39/51  soft-tissue]
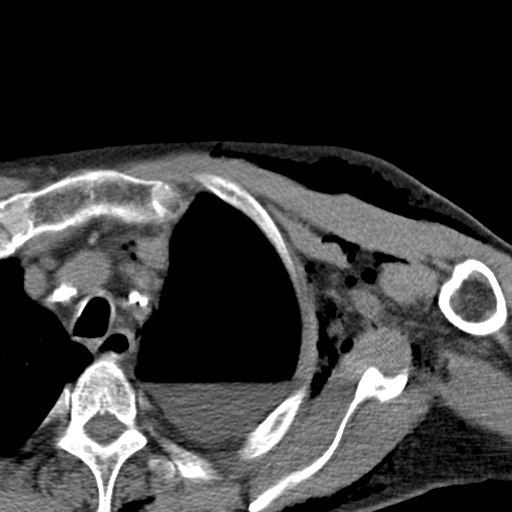
[im 41/51  lung]
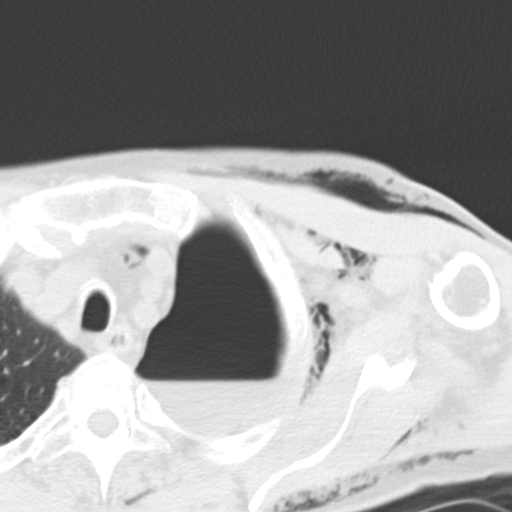
[im 44/51  soft-tissue]
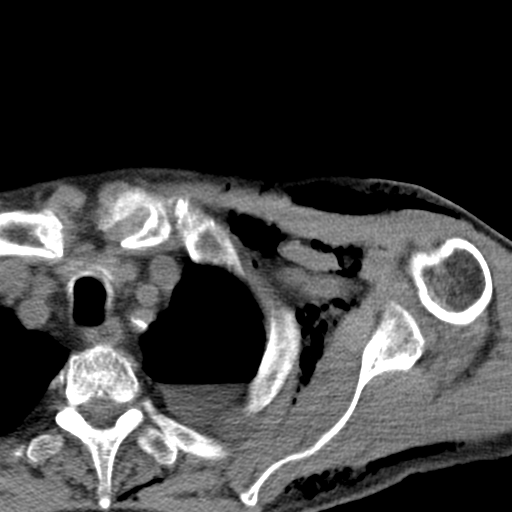
[im 44/51  lung]
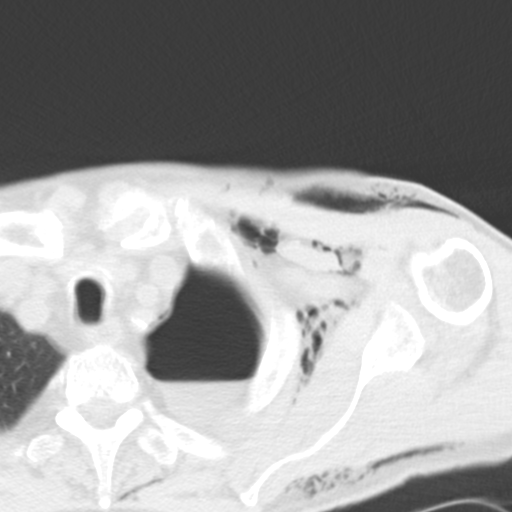
[im 46/51  lung]
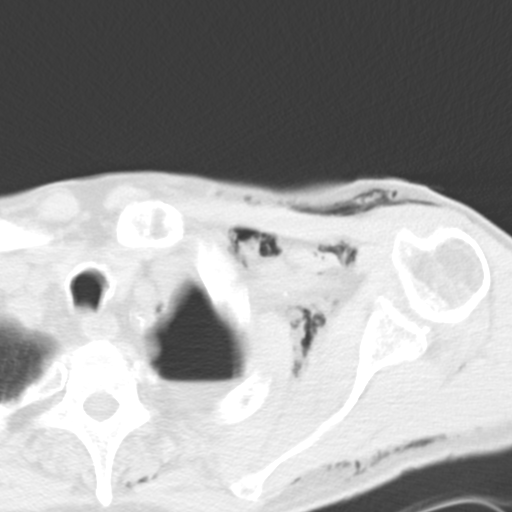
[im 48/51  soft-tissue]
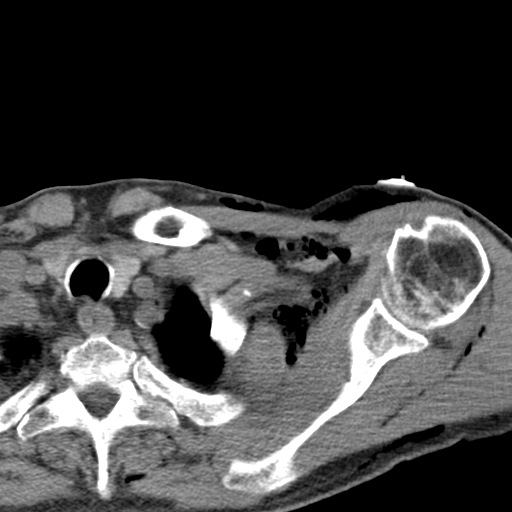
[im 48/51  lung]
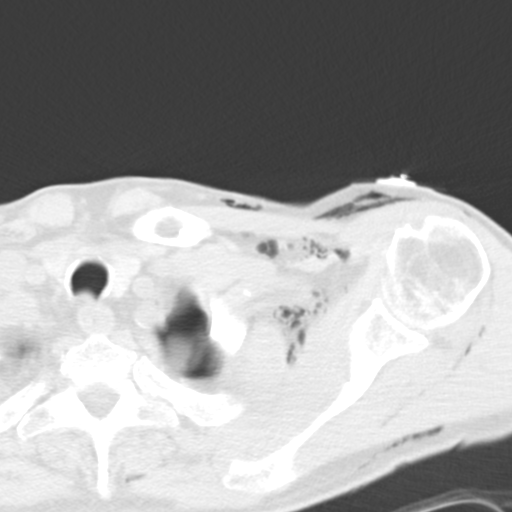

[15 of 32 positions shown; findings below may reference images not displayed]

EXAM:
CT LEFT CHEST TUBE INSERTION

MEDICATIONS:
1% LIDOCAINE LOCAL

ANESTHESIA/SEDATION:
Fentanyl 50 mcg IV; Versed 0 mg IV

Moderate Sedation Time:  None.

The patient was continuously monitored during the procedure by the
interventional radiology nurse under my direct supervision.

COMPLICATIONS:
None immediate.

PROCEDURE:
Informed written consent was obtained from the patient after a
thorough discussion of the procedural risks, benefits and
alternatives. All questions were addressed. Maximal Sterile Barrier
Technique was utilized including caps, mask, sterile gowns, sterile
gloves, sterile drape, hand hygiene and skin antiseptic. A timeout
was performed prior to the initiation of the procedure.

Previous imaging reviewed. Patient positioned supine. Noncontrast
localization CT performed. The apical loculated left
hydropneumothorax was localized.

Overlying skin marked for an anterior approach through the second-
third anterior interspace.

Under sterile conditions and local anesthesia, an 18 gauge
introducer needle was advanced from an anterior intercostal
approach. Needle position confirmed with CT. Guidewire inserted
followed by tract dilatation inserted 14 French drain. Drain
catheter position confirmed with CT. Syringe aspiration yielded
mildly exudative serosanguineous fluid. Sample sent for culture.
Catheter secured with a Prolene suture and connected to external
Pleur-Evac. Sterile dressing applied. No immediate complication.
Patient tolerated the procedure well.
IMPRESSION: Successful CT-guided anterior left chest tube insertion for a
residual apical loculated hydropneumothorax.

## 2018-06-30 IMAGING — DX DG CHEST 1V PORT
1 series · 1 of 1 positions shown · non-contrast
Comparison: Portable chest x-ray September 30, 2017

CLINICAL DATA: Follow-up left-sided hydropneumothorax. Recent left
upper lobectomy.

EXAM:
PORTABLE CHEST 1 VIEW

[chest ap]
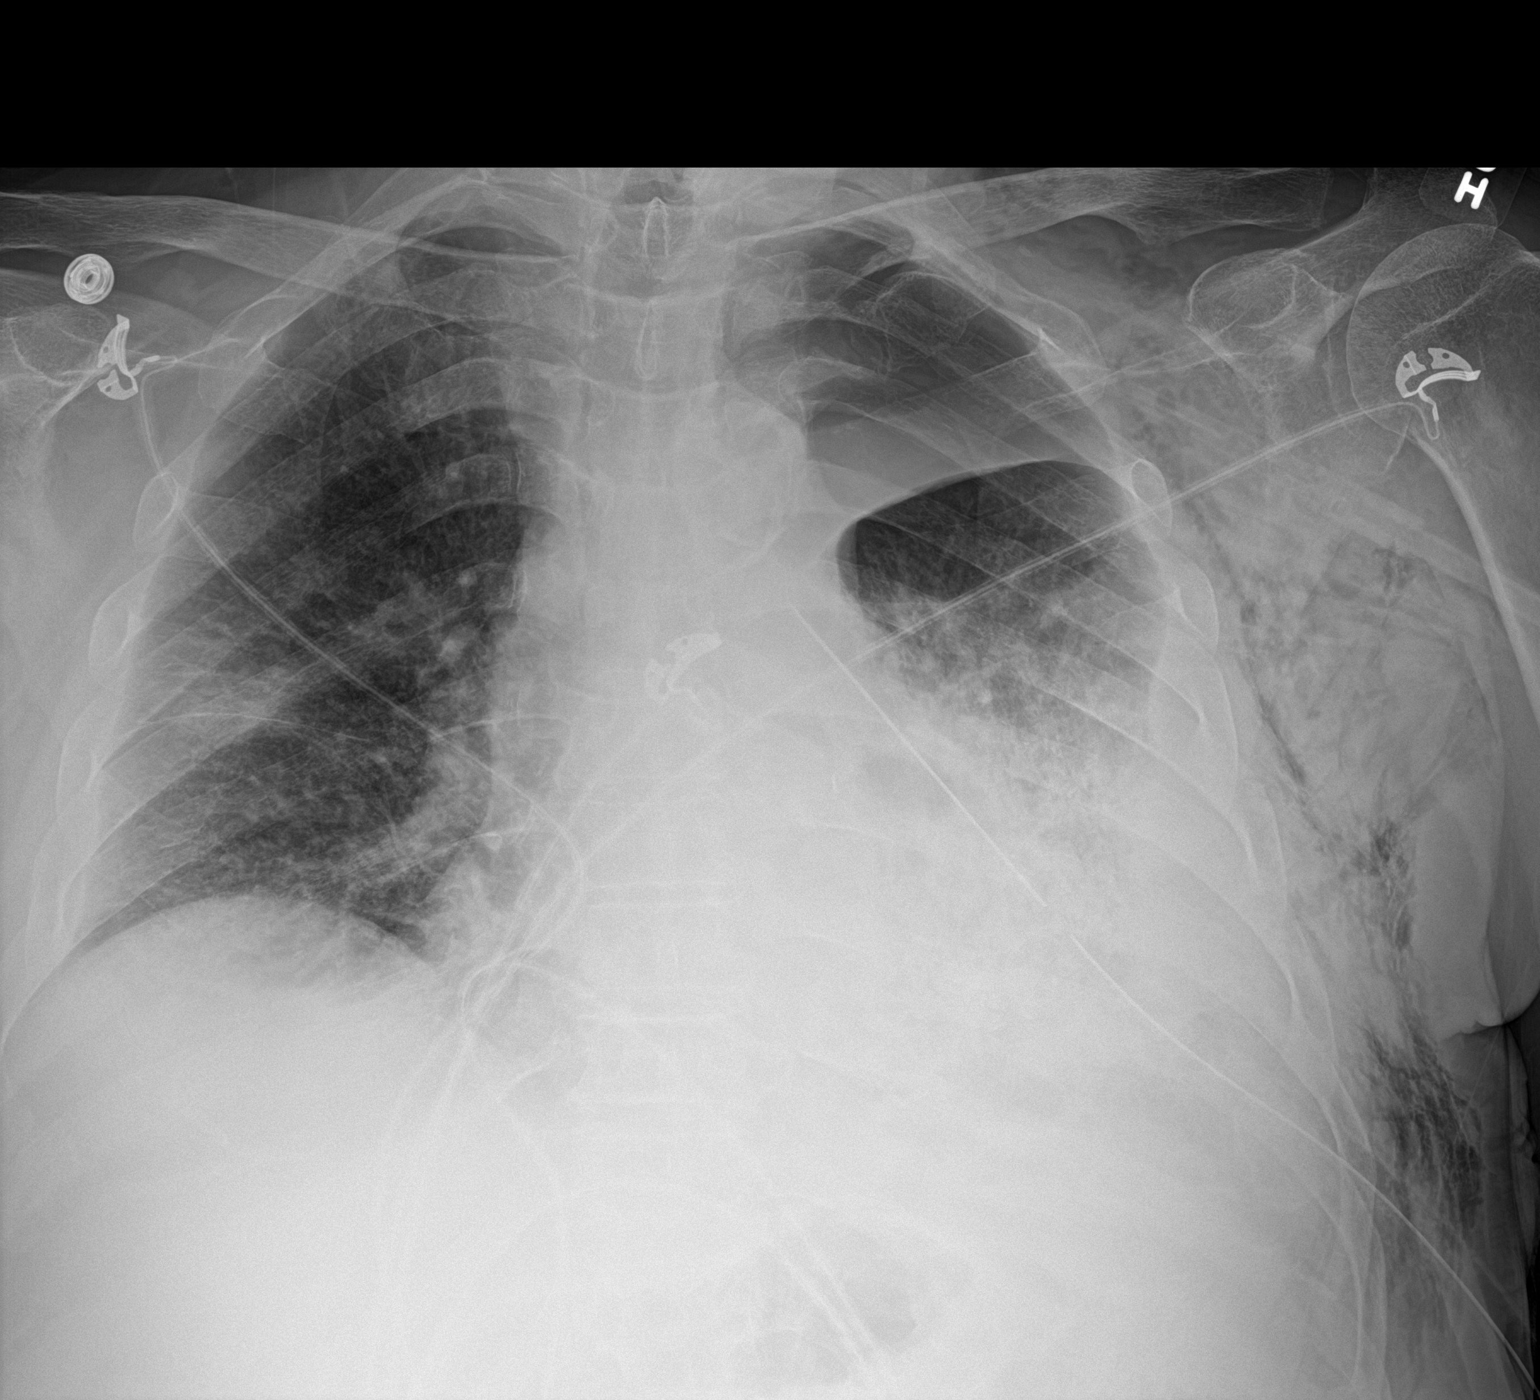

[1 of 1 positions shown; findings below may reference images not displayed]

FINDINGS: There is a persistent left-sided hydropneumothorax. The chest tube
is in stable position projecting over the posteromedial aspect of
the left 6 rib. There is a moderate amount of subcutaneous emphysema
on the left. There is no significant mediastinal shift. There is new
patchy infiltrate in the right mid lung. The cardiac silhouette is
not enlarged. The pulmonary vascularity is normal. There is
calcification in the wall of the aortic arch.
IMPRESSION: Stable appearance of the large left-sided hydropneumothorax.
Persistent increased interstitial density in the mid and lower left
lung. The chest tube is in stable position.

New patchy airspace opacity in the right mid lung is worrisome for
pneumonia. No CHF.

Thoracic aortic atherosclerosis.

## 2018-07-01 IMAGING — DX DG CHEST 1V PORT
1 series · 1 of 1 positions shown · non-contrast
Comparison: 10/01/2017

CLINICAL DATA: Follow-up chest tube

EXAM:
PORTABLE CHEST 1 VIEW

[chest]
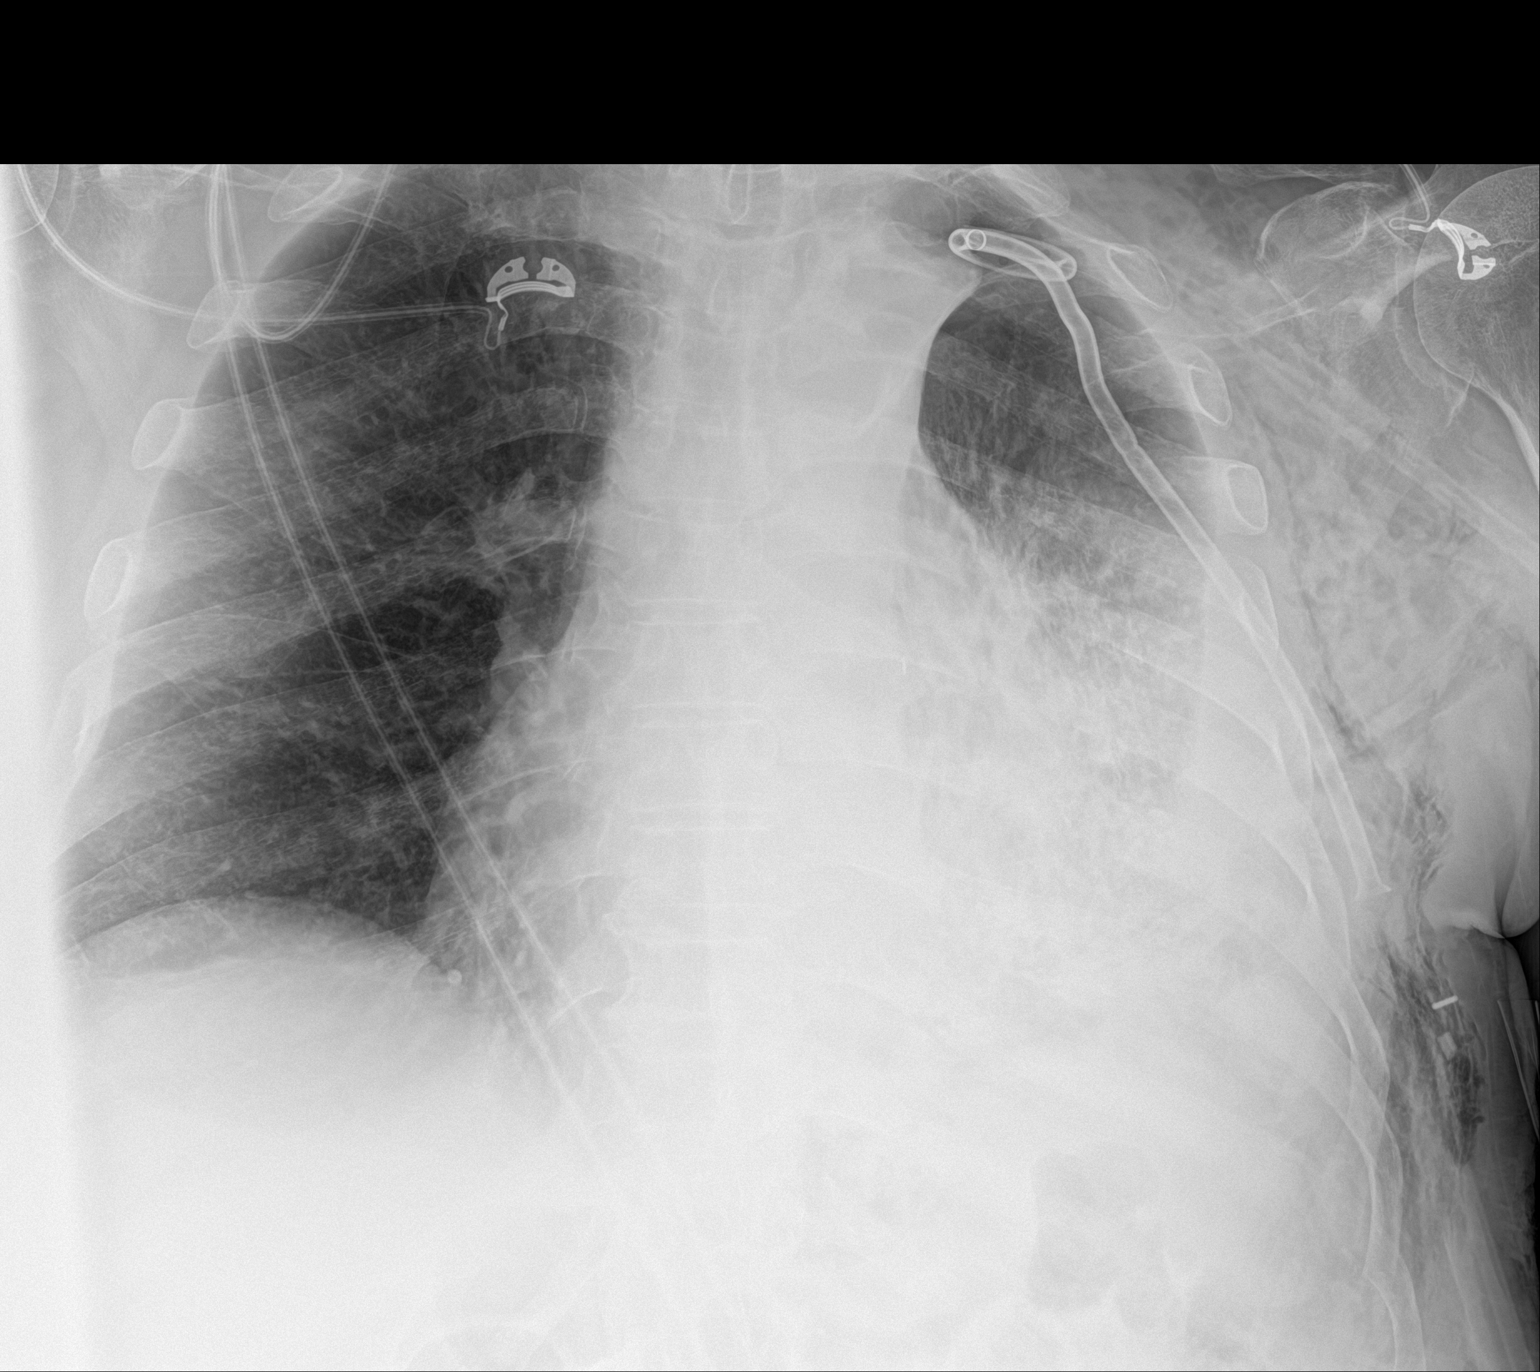

[1 of 1 positions shown; findings below may reference images not displayed]

FINDINGS: Cardiac shadow is enlarged. New pigtail catheter is noted in the
left apex. The previously seen hydropneumothorax has reduced in the
interval from the prior exam. Some persistent likely loculated fluid
is noted inferiorly. The previously seen chest tube has been
removed. The right lung is clear. Stable infiltrate is noted in the
mid and lower lobes on the left.
IMPRESSION: New left chest tube with reduction in the left hydropneumothorax. A
likely loculated inferior pleural effusion is noted and stable.

Stable left infiltrate

## 2018-09-09 ENCOUNTER — Other Ambulatory Visit: Payer: Self-pay | Admitting: Thoracic Surgery (Cardiothoracic Vascular Surgery)

## 2018-09-09 DIAGNOSIS — R911 Solitary pulmonary nodule: Secondary | ICD-10-CM

## 2018-09-10 ENCOUNTER — Ambulatory Visit
Admission: RE | Admit: 2018-09-10 | Discharge: 2018-09-10 | Disposition: A | Discharge status: Home / Self Care | Payer: Medicare Other | Source: Ambulatory Visit | Attending: Thoracic Surgery (Cardiothoracic Vascular Surgery) | Admitting: Thoracic Surgery (Cardiothoracic Vascular Surgery)

## 2018-09-10 ENCOUNTER — Ambulatory Visit: Payer: Medicare Other | Admitting: Thoracic Surgery (Cardiothoracic Vascular Surgery)

## 2018-09-10 VITALS — BP 100/64 | HR 88 | Resp 20 | Ht 70.0 in | Wt 197.0 lb

## 2018-09-10 DIAGNOSIS — R911 Solitary pulmonary nodule: Secondary | ICD-10-CM

## 2018-09-10 DIAGNOSIS — Z902 Acquired absence of lung [part of]: Secondary | ICD-10-CM | POA: Diagnosis not present

## 2018-09-10 DIAGNOSIS — C3492 Malignant neoplasm of unspecified part of left bronchus or lung: Secondary | ICD-10-CM | POA: Diagnosis not present

## 2018-09-10 NOTE — Progress Notes (Signed)
[address]       [place] [address]             [phone]       HPI: Nathaniel Long returns for scheduled follow-up visit  Nathaniel Long is a 77 year old man with a past history of tobacco abuse, peripheral arterial disease, ascending aneurysm, reflux, carotid stenosis, hearing loss, and chronic kidney disease.  He had a 60-pack-year history of smoking before quitting in July 2018.  He was found to have a lung nodule after chainsaw accident.  The nodule was hypermetabolic on PET.  Navigational bronchoscopy showed non-small cell carcinoma.  He underwent a thoracoscopic left upper lobectomy on 09/19/2018.  He turned out to have stage IA disease.  He saw Dr. Julien Nordmann in August.  He was doing well at that time.  But he had no evidence of recurrent disease.  He feels well.  He does get some occasional shortness of breath but it is really not significantly different than it was prior to his surgery.  He is not having any pain.  His appetite is good.  He has not had any significant weight loss.  Past Medical History:  Diagnosis Date  . Anxiety   . Carotid artery stenosis   . Chronic lower back pain   . CKD (chronic kidney disease)   . Complication of anesthesia    "can't pee when I come out of OR" (07/18/2017)  . Contact with chainsaw as cause of accidental injury 07/16/2017   sternal fracture with bilateral anterior fifth and sixth rib fractures /notes 07/16/2017  . COPD (chronic obstructive pulmonary disease) ([facility])   . Dyspnea   . GERD (gastroesophageal reflux disease)   . Hard of hearing   . High cholesterol   . Hypertension   . Thoracic ascending aortic aneurysm [facility])     Current Outpatient Medications  Medication Sig Dispense Refill  . albuterol (PROVENTIL) (2.5 MG/3ML) 0.083% nebulizer solution Take 3 mLs (2.5 mg total) by nebulization every 6 (six) hours as needed for wheezing or shortness of breath. Dx:C34.12 120 mL 11  . ALPRAZolam (XANAX) 0.5 MG tablet  Take 1 tablet (0.5 mg total) by mouth at bedtime as needed for anxiety.  0  . amLODipine (NORVASC) 10 MG tablet Take 0.5 tablets (5 mg total) by mouth daily. 60 tablet 6  . calcium carbonate (TUMS - DOSED IN MG ELEMENTAL CALCIUM) 500 MG chewable tablet Chew 2 tablets by mouth daily as needed for indigestion or heartburn.     . cetirizine (ZYRTEC) 10 MG tablet TAKE 1 TABLET BY MOUTH AT BEDTIME (Patient taking differently: Take 10 mg by mouth daily as needed for allergies) 31 tablet 3  . ezetimibe (ZETIA) 10 MG tablet Take 5 mg by mouth daily.  3  . fish oil-omega-3 fatty acids 1000 MG capsule Take 1 g by mouth every evening.     Marland Kitchen LIVALO 4 MG TABS Take 2 mg by mouth daily.     . metoprolol succinate (TOPROL-XL) 25 MG 24 hr tablet Take 25 mg by mouth daily.    . Multiple Vitamins-Minerals (CENTRUM PO) Take 1 tablet by mouth every evening.     . pantoprazole (PROTONIX) 40 MG tablet Take 40 mg by mouth at bedtime.     . sodium chloride (OCEAN) 0.65 % SOLN nasal spray Place 1 spray into both nostrils as needed for congestion.    . traMADol (ULTRAM) 50 MG tablet Take 50 mg by mouth daily.     Marland Kitchen  umeclidinium-vilanterol (ANORO ELLIPTA) 62.5-25 MCG/INH AEPB Inhale 1 puff daily into the lungs. 60 each 3   No current facility-administered medications for this visit.    Facility-Administered Medications Ordered in Other Visits  Medication Dose Route Frequency Provider Last Rate Last Dose  . 0.9 %  sodium chloride infusion  250 mL Intravenous PRN Gold, Wayne E, PA-C      . levalbuterol (XOPENEX) nebulizer solution 0.63 mg  0.63 mg Nebulization Q6H PRN Gaye Pollack, MD   0.63 mg at 09/29/17 1646  . sodium chloride flush (NS) 0.9 % injection 3 mL  3 mL Intravenous Q12H Gold, Wayne E, PA-C      . sodium chloride flush (NS) 0.9 % injection 3 mL  3 mL Intravenous Q12H Gold, Wayne E, PA-C      . sodium chloride flush (NS) 0.9 % injection 3 mL  3 mL Intravenous PRN Gold, Wayne E, PA-C        Physical  Exam BP 100/64   Pulse 88   Resp 20   Ht 5\' 10"  (1.778 m)   Wt 197 lb (89.4 kg)   SpO2 99% Comment: RA  BMI 28.93 kg/m  77 year old man in no acute distress Extremely hard of hearing Alert and oriented x3 with no focal deficits Lungs diminished at left base, otherwise clear Cardiac regular rate and rhythm with a 2/6 systolic murmur No cervical or supraclavicular adenopathy  Diagnostic Tests: CHEST - 2 VIEW  COMPARISON:  CT scan dated 05/03/2018 and chest x-ray dated 12/11/2017  FINDINGS: Heart size and pulmonary vascularity are normal. Chronic scarring at the left lung base with volume loss secondary to the left upper lobectomy. Pleural thickening. Lungs are otherwise clear. No effusions.  IMPRESSION: No acute cardiopulmonary abnormality. Stable appearance of the chest since the prior study.   Electronically Signed   By: Lorriane Shire M.D.   On: 09/10/2018 11:43 I personally reviewed the chest x-ray images and concur with the findings noted above  Impression: Nathaniel Long is a 77 year old man who is now a year out from a thoracoscopic left upper lobectomy for a stage Ia non-small cell carcinoma.  He is now a year out from surgery with no evidence of recurrent disease.  He is being followed by Dr. Julien Nordmann.  Ascending aneurysm-borderline enlarged ascending aorta approximately 4 cm. He will be getting CTs to follow his lung cancer.  If any significant change I will be happy to reevaluate that.  Tobacco abuse - he does have a history of smoking.  He quit in July 2018.  Plan: Follow-up as scheduled with Dr. Thad Ranger, MD [facility] [phone]

## 2018-11-04 ENCOUNTER — Inpatient Hospital Stay: Payer: Medicare Other | Attending: Internal Medicine

## 2018-11-04 ENCOUNTER — Other Ambulatory Visit: Payer: Self-pay

## 2018-11-04 ENCOUNTER — Ambulatory Visit (HOSPITAL_COMMUNITY)
Admission: RE | Admit: 2018-11-04 | Discharge: 2018-11-04 | Disposition: A | Discharge status: Home / Self Care | Payer: Medicare Other | Source: Ambulatory Visit | Attending: Internal Medicine | Admitting: Internal Medicine

## 2018-11-04 DIAGNOSIS — J449 Chronic obstructive pulmonary disease, unspecified: Secondary | ICD-10-CM | POA: Insufficient documentation

## 2018-11-04 DIAGNOSIS — N189 Chronic kidney disease, unspecified: Secondary | ICD-10-CM | POA: Insufficient documentation

## 2018-11-04 DIAGNOSIS — C3490 Malignant neoplasm of unspecified part of unspecified bronchus or lung: Secondary | ICD-10-CM | POA: Diagnosis present

## 2018-11-04 DIAGNOSIS — Z902 Acquired absence of lung [part of]: Secondary | ICD-10-CM | POA: Insufficient documentation

## 2018-11-04 DIAGNOSIS — I129 Hypertensive chronic kidney disease with stage 1 through stage 4 chronic kidney disease, or unspecified chronic kidney disease: Secondary | ICD-10-CM | POA: Insufficient documentation

## 2018-11-04 DIAGNOSIS — Z85118 Personal history of other malignant neoplasm of bronchus and lung: Secondary | ICD-10-CM | POA: Insufficient documentation

## 2018-11-04 LAB — CBC WITH DIFFERENTIAL (CANCER CENTER ONLY)
Abs Immature Granulocytes: 0.02 10*3/uL (ref 0.00–0.07)
Basophils Absolute: 0.1 10*3/uL (ref 0.0–0.1)
Basophils Relative: 1 %
EOS ABS: 0.2 10*3/uL (ref 0.0–0.5)
EOS PCT: 3 %
HCT: 38.6 % — ABNORMAL LOW (ref 39.0–52.0)
Hemoglobin: 12.7 g/dL — ABNORMAL LOW (ref 13.0–17.0)
Immature Granulocytes: 0 %
LYMPHS ABS: 3.5 10*3/uL (ref 0.7–4.0)
Lymphocytes Relative: 44 %
MCH: 31.5 pg (ref 26.0–34.0)
MCHC: 32.9 g/dL (ref 30.0–36.0)
MCV: 95.8 fL (ref 80.0–100.0)
MONOS PCT: 8 %
Monocytes Absolute: 0.6 10*3/uL (ref 0.1–1.0)
Neutro Abs: 3.4 10*3/uL (ref 1.7–7.7)
Neutrophils Relative %: 44 %
Platelet Count: 292 10*3/uL (ref 150–400)
RBC: 4.03 MIL/uL — ABNORMAL LOW (ref 4.22–5.81)
RDW: 12.7 % (ref 11.5–15.5)
WBC Count: 7.8 10*3/uL (ref 4.0–10.5)
nRBC: 0 % (ref 0.0–0.2)

## 2018-11-04 LAB — CMP (CANCER CENTER ONLY)
ALT: 18 U/L (ref 0–44)
ANION GAP: 7 (ref 5–15)
AST: 22 U/L (ref 15–41)
Albumin: 4.1 g/dL (ref 3.5–5.0)
Alkaline Phosphatase: 87 U/L (ref 38–126)
BUN: 16 mg/dL (ref 8–23)
CO2: 24 mmol/L (ref 22–32)
Calcium: 8.7 mg/dL — ABNORMAL LOW (ref 8.9–10.3)
Chloride: 111 mmol/L (ref 98–111)
Creatinine: 1.54 mg/dL — ABNORMAL HIGH (ref 0.61–1.24)
GFR, EST NON AFRICAN AMERICAN: 43 mL/min — AB (ref >60)
GFR, Est AFR Am: 50 mL/min — ABNORMAL LOW (ref >60)
Glucose, Bld: 103 mg/dL — ABNORMAL HIGH (ref 70–99)
POTASSIUM: 4.2 mmol/L (ref 3.5–5.1)
Sodium: 142 mmol/L (ref 135–145)
TOTAL PROTEIN: 6.9 g/dL (ref 6.5–8.1)
Total Bilirubin: 0.5 mg/dL (ref 0.3–1.2)

## 2018-11-04 MED ORDER — LIVALO 4 MG PO TABS: 2.0000 mg | ORAL_TABLET | Freq: Every day | ORAL | 3 refills | Status: DC

## 2018-11-04 MED ORDER — AMLODIPINE BESYLATE 10 MG PO TABS: 5.0000 mg | ORAL_TABLET | Freq: Every day | ORAL | 6 refills | Status: DC

## 2018-11-06 ENCOUNTER — Inpatient Hospital Stay (HOSPITAL_BASED_OUTPATIENT_CLINIC_OR_DEPARTMENT_OTHER): Payer: Medicare Other | Admitting: Internal Medicine

## 2018-11-06 ENCOUNTER — Telehealth: Payer: Self-pay

## 2018-11-06 ENCOUNTER — Encounter: Payer: Self-pay | Admitting: Internal Medicine

## 2018-11-06 VITALS — BP 119/72 | HR 56 | Temp 97.6°F | Resp 18 | Ht 70.0 in | Wt 192.0 lb

## 2018-11-06 DIAGNOSIS — I129 Hypertensive chronic kidney disease with stage 1 through stage 4 chronic kidney disease, or unspecified chronic kidney disease: Secondary | ICD-10-CM

## 2018-11-06 DIAGNOSIS — Z85118 Personal history of other malignant neoplasm of bronchus and lung: Secondary | ICD-10-CM

## 2018-11-06 DIAGNOSIS — J449 Chronic obstructive pulmonary disease, unspecified: Secondary | ICD-10-CM | POA: Diagnosis not present

## 2018-11-06 DIAGNOSIS — C3412 Malignant neoplasm of upper lobe, left bronchus or lung: Secondary | ICD-10-CM

## 2018-11-06 DIAGNOSIS — N189 Chronic kidney disease, unspecified: Secondary | ICD-10-CM

## 2018-11-06 DIAGNOSIS — Z902 Acquired absence of lung [part of]: Secondary | ICD-10-CM

## 2018-11-06 DIAGNOSIS — C3490 Malignant neoplasm of unspecified part of unspecified bronchus or lung: Secondary | ICD-10-CM

## 2018-11-06 NOTE — Progress Notes (Signed)
[facility] Telephone:([phone]   Fax:([phone]) [phone]  OFFICE PROGRESS NOTE  [name], MD [address] [place] [address]  DIAGNOSIS: stage IA (T1a, N0, M0) non-small cell lung cancer, adenocarcinoma presented with left upper lobe lung nodule.  PRIOR THERAPY: Status post left upper lobectomy with lymph node dissection on September 19, 2017 under the care of Dr. Roxan Hockey.  CURRENT THERAPY: Observation.  INTERVAL HISTORY: Nathaniel Long 78 y.o. male returns to the clinic today for 6 months follow-up visit accompanied by his wife.  The patient is feeling fine today with no concerning complaints.  He denied having any chest pain, shortness of breath, cough or hemoptysis.  He denied having any fever or chills.  He has no nausea, vomiting, diarrhea or constipation.  He underwent skin surgery for skin cancer recently on the face.  He denied having any headache or visual changes.  The patient had repeat CT scan of the chest performed recently and he is here for evaluation and discussion of his scan results.   MEDICAL HISTORY: Past Medical History:  Diagnosis Date  . Anxiety   . Carotid artery stenosis   . Chronic lower back pain   . CKD (chronic kidney disease)   . Complication of anesthesia    "can't pee when I come out of OR" (07/18/2017)  . Contact with chainsaw as cause of accidental injury 07/16/2017   sternal fracture with bilateral anterior fifth and sixth rib fractures /notes 07/16/2017  . COPD (chronic obstructive pulmonary disease) ([place])   . Dyspnea   . GERD (gastroesophageal reflux disease)   . Hard of hearing   . High cholesterol   . Hypertension   . Thoracic ascending aortic aneurysm ([facility])     ALLERGIES:  is allergic to gabapentin and statins.  MEDICATIONS:  Current Outpatient Medications  Medication Sig Dispense Refill  . albuterol (PROVENTIL) (2.5 MG/3ML) 0.083% nebulizer solution Take 3 mLs (2.5 mg total) by nebulization every 6  (six) hours as needed for wheezing or shortness of breath. Dx:C34.12 120 mL 11  . ALPRAZolam (XANAX) 0.5 MG tablet Take 1 tablet (0.5 mg total) by mouth at bedtime as needed for anxiety.  0  . amLODipine (NORVASC) 10 MG tablet Take 0.5 tablets (5 mg total) by mouth daily. 60 tablet 6  . calcium carbonate (TUMS - DOSED IN MG ELEMENTAL CALCIUM) 500 MG chewable tablet Chew 2 tablets by mouth daily as needed for indigestion or heartburn.     . cetirizine (ZYRTEC) 10 MG tablet TAKE 1 TABLET BY MOUTH AT BEDTIME (Patient taking differently: Take 10 mg by mouth daily as needed for allergies) 31 tablet 3  . ezetimibe (ZETIA) 10 MG tablet Take 5 mg by mouth daily.  3  . fish oil-omega-3 fatty acids 1000 MG capsule Take 1 g by mouth every evening.     Marland Kitchen LIVALO 4 MG TABS Take 0.5 tablets (2 mg total) by mouth daily. 30 tablet 3  . metoprolol succinate (TOPROL-XL) 25 MG 24 hr tablet Take 25 mg by mouth daily.    . Multiple Vitamins-Minerals (CENTRUM PO) Take 1 tablet by mouth every evening.     . pantoprazole (PROTONIX) 40 MG tablet Take 40 mg by mouth at bedtime.     . sodium chloride (OCEAN) 0.65 % SOLN nasal spray Place 1 spray into both nostrils as needed for congestion.    . traMADol (ULTRAM) 50 MG tablet Take 50 mg by mouth daily.     Marland Kitchen  umeclidinium-vilanterol (ANORO ELLIPTA) 62.5-25 MCG/INH AEPB Inhale 1 puff daily into the lungs. 60 each 3   No current facility-administered medications for this visit.    Facility-Administered Medications Ordered in Other Visits  Medication Dose Route Frequency Provider Last Rate Last Dose  . 0.9 %  sodium chloride infusion  250 mL Intravenous PRN Gold, Wayne E, PA-C      . levalbuterol (XOPENEX) nebulizer solution 0.63 mg  0.63 mg Nebulization Q6H PRN Gaye Pollack, MD   0.63 mg at 09/29/17 1646  . sodium chloride flush (NS) 0.9 % injection 3 mL  3 mL Intravenous Q12H Gold, Wayne E, PA-C      . sodium chloride flush (NS) 0.9 % injection 3 mL  3 mL Intravenous Q12H  Gold, Wayne E, PA-C      . sodium chloride flush (NS) 0.9 % injection 3 mL  3 mL Intravenous PRN [name], PA-C        SURGICAL HISTORY:  Past Surgical History:  Procedure Laterality Date  . ABDOMINAL AORTOGRAM W/LOWER EXTREMITY N/A 05/01/2017   Procedure: Abdominal Aortogram w/Lower Extremity;  Surgeon: Adrian Prows, MD;  Location: [facility];  Service: Cardiovascular;  Laterality: N/A;  . BACK SURGERY    . FINGER REPLANTATION Left    "ring finger"  . HEMORRHOID SURGERY    . POSTERIOR LUMBAR FUSION  1986  . RENAL ANGIOGRAPHY Bilateral 12/18/2017   Procedure: RENAL ANGIOGRAPHY;  Surgeon: Nigel Mormon, MD;  Location: [facility];  Service: Cardiovascular;  Laterality: Bilateral;  . VIDEO ASSISTED THORACOSCOPY (VATS)/ LOBECTOMY Left 09/19/2017   Procedure: LEFT VIDEO ASSISTED THORACOSCOPY (VATS)/ LEFT UPPER LOBECTOMY, LYMPH NODE DISECTION;  Surgeon: Melrose Nakayama, MD;  Location: [facility];  Service: Thoracic;  Laterality: Left;  Marland Kitchen VIDEO BRONCHOSCOPY WITH ENDOBRONCHIAL NAVIGATION N/A 09/05/2017   Procedure: VIDEO BRONCHOSCOPY WITH ENDOBRONCHIAL NAVIGATION;  Surgeon: Collene Gobble, MD;  Location: [facility];  Service: Thoracic;  Laterality: N/A;  . VIDEO BRONCHOSCOPY WITH ENDOBRONCHIAL ULTRASOUND N/A 09/05/2017   Procedure: VIDEO BRONCHOSCOPY WITH ENDOBRONCHIAL ULTRASOUND;  Surgeon: Collene Gobble, MD;  Location: [facility];  Service: Thoracic;  Laterality: N/A;    REVIEW OF SYSTEMS:  A comprehensive review of systems was negative.   PHYSICAL EXAMINATION: General appearance: alert, cooperative and no distress Head: Normocephalic, without obvious abnormality, atraumatic Neck: no adenopathy, no JVD, supple, symmetrical, trachea midline and thyroid not enlarged, symmetric, no tenderness/mass/nodules Lymph nodes: Cervical, supraclavicular, and axillary nodes normal. Resp: clear to auscultation bilaterally Back: symmetric, no curvature. ROM normal. No CVA tenderness. Cardio:  regular rate and rhythm, S1, S2 normal, no murmur, click, rub or gallop GI: soft, non-tender; bowel sounds normal; no masses,  no organomegaly Extremities: extremities normal, atraumatic, no cyanosis or edema  ECOG PERFORMANCE STATUS: 1 - Symptomatic but completely ambulatory  Blood pressure 119/72, pulse (!) 56, temperature 97.6 F (36.4 C), temperature source Oral, resp. rate 18, height 5\' 10"  (1.778 m), weight 192 lb (87.1 kg), SpO2 98 %.  LABORATORY DATA: Lab Results  Component Value Date   WBC 7.8 11/04/2018   HGB 12.7 (L) 11/04/2018   HCT 38.6 (L) 11/04/2018   MCV 95.8 11/04/2018   PLT 292 11/04/2018      Chemistry      Component Value Date/Time   NA 142 11/04/2018 1011   K 4.2 11/04/2018 1011   CL 111 11/04/2018 1011   CO2 24 11/04/2018 1011   BUN 16 11/04/2018 1011   CREATININE 1.54 (H)  11/04/2018 1011   CREATININE 1.17 02/21/2012 1046      Component Value Date/Time   CALCIUM 8.7 (L) 11/04/2018 1011   ALKPHOS 87 11/04/2018 1011   AST 22 11/04/2018 1011   ALT 18 11/04/2018 1011   BILITOT 0.5 11/04/2018 1011       RADIOGRAPHIC STUDIES: Ct Chest Wo Contrast  Result Date: 11/04/2018 CLINICAL DATA:  Stage IA left upper lobe lung adenocarcinoma status post left upper lobectomy 09/19/2017. restaging. EXAM: CT CHEST WITHOUT CONTRAST TECHNIQUE: Multidetector CT imaging of the chest was performed following the standard protocol without IV contrast. COMPARISON:  05/03/2018 chest CT. FINDINGS: Cardiovascular: Normal heart size. No significant pericardial effusion/thickening. Left anterior descending and right coronary atherosclerosis. Atherosclerotic thoracic aorta with stable ectatic 4.3 cm ascending thoracic aorta. Normal caliber pulmonary arteries. Mediastinum/Nodes: No discrete thyroid nodules. Unremarkable esophagus. No pathologically enlarged axillary, mediastinal or hilar lymph nodes, noting limited sensitivity for the detection of hilar adenopathy on this noncontrast  study. Lungs/Pleura: No pneumothorax. No pleural effusion. Status post left upper lobectomy. Stable mildly thickened parenchymal band in the posterior left lower lobe compatible with nonspecific mild scarring. Mild-to-moderate centrilobular emphysema with mild diffuse bronchial wall thickening. No acute consolidative airspace disease or lung masses. Anterior right lower lobe ground-glass 0.9 cm pulmonary nodule (series 7/image 98), stable since at least 08/17/2017 chest CT. No new significant pulmonary nodules. Upper abdomen: No acute abnormality. Musculoskeletal: No aggressive appearing focal osseous lesions. Mild thoracic spondylosis. Healed deformity in the sternum. IMPRESSION: 1. No evidence of local tumor recurrence status post left upper lobectomy. 2. No findings suspicious for metastatic disease in the chest. 3. Ground-glass 0.9 cm anterior right lower lobe pulmonary nodule, stable since at least 08/17/2017 chest CT. Attention to this nodule recommended on follow-up chest CT in 1 year. 4. Stable ectatic 4.3 cm ascending thoracic aorta. Recommend annual imaging followup by CTA or MRA. This recommendation follows 2010 ACCF/AHA/AATS/ACR/ASA/SCA/SCAI/SIR/STS/SVM Guidelines for the Diagnosis and Management of Patients with Thoracic Aortic Disease. Circulation. 2010; 121: V784-O962. Aortic aneurysm NOS (ICD10-I71.9). 5. Two-vessel coronary atherosclerosis. Aortic Atherosclerosis (ICD10-I70.0) and Emphysema (ICD10-J43.9). Electronically Signed   By: Ilona Sorrel M.D.   On: 11/04/2018 13:23    ASSESSMENT AND PLAN: This is a very pleasant 78 years old white male with a stage Ia non-small cell lung cancer status post left upper lobectomy with lymph node dissection and he is currently on observation.  The patient is feeling fine today with no concerning complaints. He had repeat CT scan of the chest performed recently.  I personally and independently reviewed the scans and discussed the results with the patient  today. His scan showed no concerning findings for disease recurrence. I recommended for him to continue on observation with repeat CT scan of the chest without contrast in 6 months. For the renal insufficiency he was advised to follow-up with his primary care physician for further evaluation and referral if needed. The patient was advised to call immediately if he has any concerning symptoms in the interval. The patient voices understanding of current disease status and treatment options and is in agreement with the current care plan.  All questions were answered. The patient knows to call the clinic with any problems, questions or concerns. We can certainly see the patient much sooner if necessary.  I spent 10 minutes counseling the patient face to face. The total time spent in the appointment was 15 minutes.  Disclaimer: This note was dictated with voice recognition software. Similar sounding words can inadvertently be  transcribed and may not be corrected upon review.

## 2018-11-06 NOTE — Telephone Encounter (Signed)
Printed avs and calender of upcoming appointment. Per 2/5 los 

## 2019-01-27 ENCOUNTER — Other Ambulatory Visit: Payer: Self-pay

## 2019-01-27 ENCOUNTER — Other Ambulatory Visit: Payer: Self-pay | Admitting: Cardiology

## 2019-01-27 MED ORDER — ALPRAZOLAM 0.5 MG PO TABS: 0.5000 mg | ORAL_TABLET | Freq: Three times a day (TID) | ORAL | 0 refills | Status: DC | PRN

## 2019-01-27 NOTE — Telephone Encounter (Signed)
Alprazolam 0.5mg  3 times daily as needed for anxiety for smoking cessation #90 with no refills. Prescription faxed to [facility] at [address].

## 2019-02-03 ENCOUNTER — Other Ambulatory Visit: Payer: Self-pay

## 2019-02-03 DIAGNOSIS — I10 Essential (primary) hypertension: Secondary | ICD-10-CM

## 2019-02-03 MED ORDER — METOPROLOL SUCCINATE ER 25 MG PO TB24: 25.0000 mg | ORAL_TABLET | Freq: Every day | ORAL | 3 refills | Status: DC

## 2019-02-04 ENCOUNTER — Telehealth: Payer: Self-pay

## 2019-02-04 DIAGNOSIS — I10 Essential (primary) hypertension: Secondary | ICD-10-CM

## 2019-02-04 MED ORDER — METOPROLOL SUCCINATE ER 25 MG PO TB24: 25.0000 mg | ORAL_TABLET | Freq: Every day | ORAL | 0 refills | Status: DC

## 2019-02-04 NOTE — Telephone Encounter (Signed)
I sent it just and FYI

## 2019-02-04 NOTE — Telephone Encounter (Signed)
Pt's wife came into the office and requesting refills on Metoprolol. Refill was sent to mail order 02/03/19. I told wife I can send 7 tabs to local pharmacy until they get the mail order.

## 2019-02-05 ENCOUNTER — Other Ambulatory Visit: Payer: Self-pay

## 2019-02-05 DIAGNOSIS — I10 Essential (primary) hypertension: Secondary | ICD-10-CM

## 2019-02-06 MED ORDER — METOPROLOL SUCCINATE ER 25 MG PO TB24: 25.0000 mg | ORAL_TABLET | Freq: Every day | ORAL | 1 refills | Status: DC

## 2019-04-09 ENCOUNTER — Other Ambulatory Visit: Payer: Self-pay | Admitting: Cardiology

## 2019-04-09 NOTE — Telephone Encounter (Signed)
Please fill

## 2019-04-25 ENCOUNTER — Other Ambulatory Visit: Payer: Self-pay | Admitting: Cardiology

## 2019-04-29 NOTE — Telephone Encounter (Signed)
Fill if necessary

## 2019-04-30 ENCOUNTER — Other Ambulatory Visit: Payer: Self-pay

## 2019-04-30 MED ORDER — AMLODIPINE BESYLATE 10 MG PO TABS: 5.0000 mg | ORAL_TABLET | Freq: Every day | ORAL | 1 refills | Status: DC

## 2019-05-05 ENCOUNTER — Ambulatory Visit (HOSPITAL_COMMUNITY)
Admission: RE | Admit: 2019-05-05 | Discharge: 2019-05-05 | Disposition: A | Discharge status: Home / Self Care | Payer: Medicare Other | Source: Ambulatory Visit | Attending: Internal Medicine | Admitting: Internal Medicine

## 2019-05-05 ENCOUNTER — Inpatient Hospital Stay: Payer: Medicare Other | Attending: Internal Medicine

## 2019-05-05 ENCOUNTER — Other Ambulatory Visit: Payer: Self-pay

## 2019-05-05 DIAGNOSIS — C3490 Malignant neoplasm of unspecified part of unspecified bronchus or lung: Secondary | ICD-10-CM | POA: Diagnosis present

## 2019-05-05 DIAGNOSIS — I129 Hypertensive chronic kidney disease with stage 1 through stage 4 chronic kidney disease, or unspecified chronic kidney disease: Secondary | ICD-10-CM | POA: Insufficient documentation

## 2019-05-05 DIAGNOSIS — N189 Chronic kidney disease, unspecified: Secondary | ICD-10-CM | POA: Insufficient documentation

## 2019-05-05 DIAGNOSIS — Z85118 Personal history of other malignant neoplasm of bronchus and lung: Secondary | ICD-10-CM | POA: Insufficient documentation

## 2019-05-05 LAB — CMP (CANCER CENTER ONLY)
ALT: 23 U/L (ref 0–44)
AST: 23 U/L (ref 15–41)
Albumin: 4.1 g/dL (ref 3.5–5.0)
Alkaline Phosphatase: 77 U/L (ref 38–126)
Anion gap: 8 (ref 5–15)
BUN: 21 mg/dL (ref 8–23)
CO2: 24 mmol/L (ref 22–32)
Calcium: 8.8 mg/dL — ABNORMAL LOW (ref 8.9–10.3)
Chloride: 108 mmol/L (ref 98–111)
Creatinine: 1.64 mg/dL — ABNORMAL HIGH (ref 0.61–1.24)
GFR, Est AFR Am: 46 mL/min — ABNORMAL LOW (ref >60)
GFR, Estimated: 40 mL/min — ABNORMAL LOW (ref >60)
Glucose, Bld: 109 mg/dL — ABNORMAL HIGH (ref 70–99)
Potassium: 4.2 mmol/L (ref 3.5–5.1)
Sodium: 140 mmol/L (ref 135–145)
Total Bilirubin: 1.2 mg/dL (ref 0.3–1.2)
Total Protein: 7.1 g/dL (ref 6.5–8.1)

## 2019-05-05 LAB — CBC WITH DIFFERENTIAL (CANCER CENTER ONLY)
Abs Immature Granulocytes: 0.01 10*3/uL (ref 0.00–0.07)
Basophils Absolute: 0 10*3/uL (ref 0.0–0.1)
Basophils Relative: 1 %
Eosinophils Absolute: 0.2 10*3/uL (ref 0.0–0.5)
Eosinophils Relative: 2 %
HCT: 41.9 % (ref 39.0–52.0)
Hemoglobin: 13.7 g/dL (ref 13.0–17.0)
Immature Granulocytes: 0 %
Lymphocytes Relative: 37 %
Lymphs Abs: 2.8 10*3/uL (ref 0.7–4.0)
MCH: 31.3 pg (ref 26.0–34.0)
MCHC: 32.7 g/dL (ref 30.0–36.0)
MCV: 95.7 fL (ref 80.0–100.0)
Monocytes Absolute: 0.6 10*3/uL (ref 0.1–1.0)
Monocytes Relative: 7 %
Neutro Abs: 4.1 10*3/uL (ref 1.7–7.7)
Neutrophils Relative %: 53 %
Platelet Count: 305 10*3/uL (ref 150–400)
RBC: 4.38 MIL/uL (ref 4.22–5.81)
RDW: 12.7 % (ref 11.5–15.5)
WBC Count: 7.7 10*3/uL (ref 4.0–10.5)
nRBC: 0 % (ref 0.0–0.2)

## 2019-05-07 ENCOUNTER — Inpatient Hospital Stay: Payer: Medicare Other | Admitting: Internal Medicine

## 2019-07-09 ENCOUNTER — Ambulatory Visit: Payer: Self-pay | Admitting: Cardiology

## 2019-08-07 ENCOUNTER — Other Ambulatory Visit: Payer: Self-pay | Admitting: Cardiology

## 2019-08-11 ENCOUNTER — Other Ambulatory Visit: Payer: Self-pay | Admitting: Cardiology

## 2019-08-12 ENCOUNTER — Other Ambulatory Visit: Payer: Self-pay

## 2019-08-12 ENCOUNTER — Encounter: Payer: Self-pay | Admitting: Cardiology

## 2019-08-12 ENCOUNTER — Ambulatory Visit (INDEPENDENT_AMBULATORY_CARE_PROVIDER_SITE_OTHER): Payer: Medicare Other | Admitting: Cardiology

## 2019-08-12 VITALS — BP 129/73 | HR 49 | Temp 97.0°F | Ht 70.0 in | Wt 176.0 lb

## 2019-08-12 DIAGNOSIS — Z85118 Personal history of other malignant neoplasm of bronchus and lung: Secondary | ICD-10-CM

## 2019-08-12 DIAGNOSIS — I10 Essential (primary) hypertension: Secondary | ICD-10-CM

## 2019-08-12 DIAGNOSIS — E7800 Pure hypercholesterolemia, unspecified: Secondary | ICD-10-CM

## 2019-08-12 DIAGNOSIS — I739 Peripheral vascular disease, unspecified: Secondary | ICD-10-CM | POA: Diagnosis not present

## 2019-08-12 NOTE — Progress Notes (Signed)
Primary Physician:  [name], MD   Patient ID: Nathaniel Long, male    DOB: [birth year], 78 y.o.   MRN: [id number]  Subjective:    Chief Complaint  Patient presents with  . Hypertension  . PAD  . Follow-up    1 year    HPI: LORRIN NAWROT  is a 78 y.o. 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  He has mild symptoms of calf claudication, peripheral arteriogram on 05/01/2017 revealing right renal artery 80% stenosis and right PT and left AT occluded below the knee but otherwise mild disease. Angiograms could not explain his right hip pain and right leg pain and was recommended evaluation for pseudo-claudication.  He is here for 1 year follow up. Reports right leg pain has improved. Feels that he is doing well. His hyperlipidemia is being managed by [facility] and is tolerating this well.  Past Medical History:  Diagnosis Date  . Anxiety   . Carotid artery stenosis   . Chronic lower back pain   . CKD (chronic kidney disease)   . Complication of anesthesia    "can't pee when I come out of OR" (07/18/2017)  . Contact with chainsaw as cause of accidental injury 07/16/2017   sternal fracture with bilateral anterior fifth and sixth rib fractures /notes 07/16/2017  . COPD (chronic obstructive pulmonary disease) ([place])   . Dyspnea   . GERD (gastroesophageal reflux disease)   . Hard of hearing   . High cholesterol   . Hypertension   . Thoracic ascending aortic aneurysm [facility])     Past Surgical History:  Procedure Laterality Date  . ABDOMINAL AORTOGRAM W/LOWER EXTREMITY N/A 05/01/2017   Procedure: Abdominal Aortogram w/Lower Extremity;  Surgeon: Adrian Prows, MD;  Location: [facility];  Service: Cardiovascular;  Laterality: N/A;  . BACK SURGERY     . FINGER REPLANTATION Left    "ring finger"  . HEMORRHOID SURGERY    . POSTERIOR LUMBAR FUSION  1986  . RENAL ANGIOGRAPHY Bilateral 12/18/2017   Procedure: RENAL ANGIOGRAPHY;  Surgeon: Nigel Mormon, MD;  Location: [facility];  Service: Cardiovascular;  Laterality: Bilateral;  . VIDEO ASSISTED THORACOSCOPY (VATS)/ LOBECTOMY Left 09/19/2017   Procedure: LEFT VIDEO ASSISTED THORACOSCOPY (VATS)/ LEFT UPPER LOBECTOMY, LYMPH NODE DISECTION;  Surgeon: Melrose Nakayama, MD;  Location: [facility] [facility];  Service: Thoracic;  Laterality: Left;  Marland Kitchen VIDEO BRONCHOSCOPY WITH ENDOBRONCHIAL NAVIGATION N/A 09/05/2017   Procedure: VIDEO BRONCHOSCOPY WITH ENDOBRONCHIAL NAVIGATION;  Surgeon: Collene Gobble, MD;  Location: [place];  Service: Thoracic;  Laterality: N/A;  . VIDEO BRONCHOSCOPY WITH ENDOBRONCHIAL ULTRASOUND N/A 09/05/2017   Procedure: VIDEO BRONCHOSCOPY WITH ENDOBRONCHIAL ULTRASOUND;  Surgeon: Collene Gobble, MD;  Location: [facility];  Service: Thoracic;  Laterality: N/A;    Social History   Socioeconomic History  . Marital status: Married    Spouse name: Not on file  . Number of children: Not on file  . Years of education: Not on file  . Highest education level: Not on file  Occupational History  . Not on file  Social Needs  . Financial resource strain: Not hard at all  . Food insecurity    Worry: Never true    Inability: Never true  . Transportation needs    Medical: No  Non-medical: No  Tobacco Use  . Smoking status: Former Smoker    Packs/day: 1.00    Years: 33.00    Pack years: 33.00    Types: Cigarettes    Quit date: 04/17/2017    Years since quitting: 2.3  . Smokeless tobacco: Never Used  . Tobacco comment: May, 2018  Substance and Sexual Activity  . Alcohol use: Yes    Comment: last 95  . Drug use: No  . Sexual activity: Not Currently  Lifestyle  . Physical activity    Days per week: 0 days    Minutes per session: 0 min  . Stress: Not at all  Relationships  .  Social Herbalist on phone: Never    Gets together: More than three times a week    Attends religious service: Never    Active member of club or organization: No    Attends meetings of clubs or organizations: Never    Relationship status: Married  . Intimate partner violence    Fear of current or ex partner: No    Emotionally abused: No    Physically abused: No    Forced sexual activity: No  Other Topics Concern  . Not on file  Social History Narrative  . Not on file    Review of Systems  Constitution: Negative for decreased appetite, malaise/fatigue, weight gain and weight loss.  HENT: Positive for hearing loss.   Eyes: Negative for visual disturbance.  Cardiovascular: Positive for claudication (right leg pseudoclaudication; improved). Negative for chest pain, dyspnea on exertion, leg swelling, orthopnea, palpitations and syncope.  Respiratory: Negative for hemoptysis and wheezing.   Endocrine: Negative for cold intolerance and heat intolerance.  Hematologic/Lymphatic: Does not bruise/bleed easily.  Skin: Negative for nail changes.  Musculoskeletal: Positive for back pain. Negative for muscle weakness and myalgias.  Gastrointestinal: Negative for abdominal pain, change in bowel habit, nausea and vomiting.  Neurological: Negative for difficulty with concentration, dizziness, focal weakness and headaches.  Psychiatric/Behavioral: Negative for altered mental status and suicidal ideas.  All other systems reviewed and are negative.     Objective:  Blood pressure 129/73, pulse (!) 49, temperature (!) 97 F (36.1 C), height 5\' 10"  (1.778 m), weight 176 lb (79.8 kg), SpO2 96 %. Body mass index is 25.25 kg/m.    Physical Exam  Constitutional: He is oriented to person, place, and time. Vital signs are normal. He appears well-developed and well-nourished.  HENT:  Head: Normocephalic and atraumatic.  Neck: Normal range of motion.  Cardiovascular: Normal rate, regular rhythm  and intact distal pulses.  Murmur heard.  Early systolic murmur is present with a grade of 2/6 at the upper right sternal border. Pulses:      Femoral pulses are 2+ on the right side with bruit and 2+ on the left side with bruit.      Popliteal pulses are 2+ on the right side and 2+ on the left side.       Dorsalis pedis pulses are 2+ on the right side and 0 on the left side.       Posterior tibial pulses are 0 on the right side and 2+ on the left side.  No edema  Pulmonary/Chest: Effort normal and breath sounds normal. No accessory muscle usage. No respiratory distress.  Prolonged expiration- bilateral lung fields  thorocotomy for lobectomy  Abdominal: Soft. Bowel sounds are normal. He exhibits abdominal bruit.  Musculoskeletal: Normal range of motion.  Neurological: He is alert and  oriented to person, place, and time.  Skin: Skin is warm and dry.  Vitals reviewed.  Radiology: No results found.  Laboratory examination:    CMP Latest Ref Rng & Units 05/05/2019 11/04/2018 05/03/2018  Glucose 70 - 99 mg/dL 109(H) 103(H) 113(H)  BUN 8 - 23 mg/dL 21 16 20   Creatinine 0.61 - 1.24 mg/dL 1.64(H) 1.54(H) 1.59(H)  Sodium 135 - 145 mmol/L 140 142 139  Potassium 3.5 - 5.1 mmol/L 4.2 4.2 3.9  Chloride 98 - 111 mmol/L 108 111 108  CO2 22 - 32 mmol/L 24 24 21(L)  Calcium 8.9 - 10.3 mg/dL 8.8(L) 8.7(L) 8.8(L)  Total Protein 6.5 - 8.1 g/dL 7.1 6.9 7.1  Total Bilirubin 0.3 - 1.2 mg/dL 1.2 0.5 0.9  Alkaline Phos 38 - 126 U/L 77 87 96  AST 15 - 41 U/L 23 22 18   ALT 0 - 44 U/L 23 18 15    CBC Latest Ref Rng & Units 05/05/2019 11/04/2018 05/03/2018  WBC 4.0 - 10.5 K/uL 7.7 7.8 6.7  Hemoglobin 13.0 - 17.0 g/dL 13.7 12.7(L) 13.6  Hematocrit 39.0 - 52.0 % 41.9 38.6(L) 39.8  Platelets 150 - 400 K/uL 305 292 315   Lipid Panel     Component Value Date/Time   CHOL 129 02/21/2012 1046   TRIG 129 02/21/2012 1046   HDL 34 (L) 02/21/2012 1046   CHOLHDL 3.8 02/21/2012 1046   VLDL 26 02/21/2012 1046   LDLCALC  69 02/21/2012 1046   HEMOGLOBIN A1C Lab Results  Component Value Date   HGBA1C 6.0 02/21/2012   TSH No results for input(s): TSH in the last 8760 hours.  PRN Meds:. Medications Discontinued During This Encounter  Medication Reason  . traMADol (ULTRAM) 50 MG tablet Error   Current Meds  Medication Sig  . albuterol (PROVENTIL) (2.5 MG/3ML) 0.083% nebulizer solution Take 3 mLs (2.5 mg total) by nebulization every 6 (six) hours as needed for wheezing or shortness of breath. Dx:C34.12  . ALPRAZolam (XANAX) 0.5 MG tablet TAKE 1 TABLET BY MOUTH THREE TIMES A DAY AS NEEDED FOR ANXIETY (FOR SMOKING CESSATION)  . amLODipine (NORVASC) 10 MG tablet Take 0.5 tablets (5 mg total) by mouth daily.  . calcium carbonate (TUMS - DOSED IN MG ELEMENTAL CALCIUM) 500 MG chewable tablet Chew 2 tablets by mouth daily as needed for indigestion or heartburn.   . cetirizine (ZYRTEC) 10 MG tablet TAKE 1 TABLET BY MOUTH AT BEDTIME (Patient taking differently: Take 10 mg by mouth daily as needed for allergies)  . ezetimibe (ZETIA) 10 MG tablet Take 5 mg by mouth daily.  . fish oil-omega-3 fatty acids 1000 MG capsule Take 1 g by mouth every evening.   Marland Kitchen LIVALO 4 MG TABS Take 0.5 tablets (2 mg total) by mouth daily.  . metoprolol succinate (TOPROL-XL) 25 MG 24 hr tablet Take 1 tablet (25 mg total) by mouth daily.  . Multiple Vitamins-Minerals (CENTRUM PO) Take 1 tablet by mouth every evening.   . pantoprazole (PROTONIX) 40 MG tablet TAKE 1 TABLET BY MOUTH ONCE DAILY 30 MINUTES BEFORE A  MEAL  . sodium chloride (OCEAN) 0.65 % SOLN nasal spray Place 1 spray into both nostrils as needed for congestion.    Cardiac Studies:   Carotid Doppler  09/11/2017: Minimal stenosis in the right internal carotid artery (1-15%). Stenosis in the left internal carotid artery (16-49%). Mild stenosis in the left common carotid artery (<50%). Mild stenosis in the left external carotid artery (<50%). Left vertebral artery waveform  shows  high resistance and may suggest hemodynamically significant stenosis. Antegrade right vertebral artery flow. Follow up in one year is appropriate if clinically indicated. Compared to the study done on 03/08/2017, left ICA stenosis severity reduced from >50%. Otherwise no significant change.  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 Previously noted 90% stenosis by angiogram on 05/01/2017 not noted.  Nuclear stress test  08/31/2017: 1. Pharmacologic stress testing was performed with intravenous administration of .4 mg of Lexiscan. 2. Stress EKG is non diagnostic for ischemia as it is a pharmacologic stress. 3. Gated SPECT images reveal normal myocardial thickening and wall motion. The left ventricular ejection fraction was calculated to be 60%. SPECT images demonstrate small perfusion abnormality of mild intensity in the basal inferior and mid inferior myocardial wall(s) on the stress images with mild reversibility on rest images. This could represent diaphragmatic attenuation. 4. This is a low risk study.  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 Right PT and left AT occluded below knee, but otherwise mild disease.  Echocardiogram [02/23/2016]: Left ventricle cavity is normal in size. Mild concentric hypertrophy of the left ventricle. Normal global wall motion. Doppler evidence of grade I (impaired) diastolic dysfunction. LVEF visully estimated at 55-60%. Mild calcification of the aortic valve annulus. Mildly restricted aortic valve leaflets. Mild aortic valve leaflet calcification. No aortic valve regurgitation noted. Trace aortic valve stenosis. Aortic valve peak pressure gradient of 24 and mean gradient of 9 mmHg, calculated aortic valve area 2.32 cm. Mild tricuspid regurgitation. No evidence of pulmonary hypertension.  Assessment:   Primary hypertension -  Plan: EKG 12-Lead  PAD (peripheral artery disease) (HCC)  Hyperlipidemia, group A  History of lung cancer  EKG 08/12/2019: Sinus bradycardia at 50 bpm, normal axis. LVH. Nonspecific T abnormality. No changes from EKG 07/03/2018.  Recommendations:   Patient is here for 1 year follow up for PAD. He has had improvement in right leg pain, and symptoms are very minimal. He feels that he is doing well. No changes are noted to vascular exam. Continue with medical management.   Blood pressure is well controlled, no changes were made to medications. He reports that his lipids are well controlled and are being managed by Dr. Mellody Drown. Will request recent labs from PCP office for our records. Kidney function has been stable by labs performed in August. Will continue to see him on a annual basis, unless he has any problems, will be happy to see him sooner.   Miquel Dunn, MSN, APRN, FNP-C [facility]. [place] Office: [phone] Fax: [phone]

## 2019-08-12 NOTE — Telephone Encounter (Signed)
Can this be filled ?

## 2019-08-19 ENCOUNTER — Telehealth: Payer: Self-pay

## 2019-08-19 MED ORDER — LIVALO 4 MG PO TABS: 2.0000 mg | ORAL_TABLET | Freq: Every day | ORAL | 3 refills | Status: DC

## 2019-08-20 NOTE — Telephone Encounter (Signed)
error 

## 2019-10-21 ENCOUNTER — Other Ambulatory Visit: Payer: Self-pay

## 2019-10-21 MED ORDER — LIVALO 4 MG PO TABS: 2.0000 mg | ORAL_TABLET | Freq: Every day | ORAL | 0 refills | Status: DC

## 2019-11-07 ENCOUNTER — Ambulatory Visit: Payer: Medicare Other | Attending: Family

## 2019-11-07 DIAGNOSIS — Z20822 Contact with and (suspected) exposure to covid-19: Secondary | ICD-10-CM

## 2019-11-09 LAB — NOVEL CORONAVIRUS, NAA: SARS-CoV-2, NAA: NOT DETECTED

## 2019-11-13 ENCOUNTER — Other Ambulatory Visit: Payer: Self-pay | Admitting: Pulmonary Disease

## 2019-11-13 ENCOUNTER — Other Ambulatory Visit: Payer: Self-pay

## 2019-11-13 MED ORDER — ALBUTEROL SULFATE (2.5 MG/3ML) 0.083% IN NEBU: INHALATION_SOLUTION | RESPIRATORY_TRACT | 11 refills | Status: DC

## 2019-11-17 ENCOUNTER — Other Ambulatory Visit: Payer: Self-pay

## 2019-11-17 ENCOUNTER — Other Ambulatory Visit: Payer: Self-pay | Admitting: Cardiology

## 2019-11-17 DIAGNOSIS — F5101 Primary insomnia: Secondary | ICD-10-CM

## 2019-11-17 DIAGNOSIS — F411 Generalized anxiety disorder: Secondary | ICD-10-CM

## 2019-11-17 MED ORDER — ALPRAZOLAM 0.5 MG PO TABS: ORAL_TABLET | ORAL | 0 refills | Status: DC

## 2019-11-17 MED ORDER — ALPRAZOLAM 0.5 MG PO TABS: 0.5000 mg | ORAL_TABLET | Freq: Every day | ORAL | 5 refills | Status: DC

## 2019-11-17 MED ORDER — ALPRAZOLAM 0.5 MG PO TABS: ORAL_TABLET | ORAL | 5 refills | Status: DC

## 2019-11-22 ENCOUNTER — Ambulatory Visit: Payer: Medicare Other | Attending: Internal Medicine

## 2019-11-22 DIAGNOSIS — Z23 Encounter for immunization: Secondary | ICD-10-CM | POA: Insufficient documentation

## 2019-11-22 NOTE — Progress Notes (Signed)
   Covid-19 Vaccination Clinic  Name:  LYN DEEMER    MRN: [id number] DOB: 1941/07/17  11/22/2019  Mr. Woolford was observed post Covid-19 immunization for 15 minutes without incidence. He was provided with Vaccine Information Sheet and instruction to access the V-Safe system.   Mr. Kumpf was instructed to call 911 with any severe reactions post vaccine: Marland Kitchen Difficulty breathing  . Swelling of your face and throat  . A fast heartbeat  . A bad rash all over your body  . Dizziness and weakness    Immunizations Administered    Name Date Dose VIS Date Route   Pfizer COVID-19 Vaccine 11/22/2019 12:34 PM 0.3 mL 09/12/2019 Intramuscular   Manufacturer: [place]   Lot: [id number]   [place]: [id number]

## 2019-12-02 ENCOUNTER — Other Ambulatory Visit: Payer: Self-pay | Admitting: Cardiology

## 2019-12-02 DIAGNOSIS — I10 Essential (primary) hypertension: Secondary | ICD-10-CM

## 2019-12-11 ENCOUNTER — Other Ambulatory Visit: Payer: Self-pay

## 2019-12-11 ENCOUNTER — Ambulatory Visit: Payer: Medicare Other | Admitting: Pulmonary Disease

## 2019-12-11 ENCOUNTER — Encounter: Payer: Self-pay | Admitting: Pulmonary Disease

## 2019-12-11 VITALS — BP 124/78 | HR 62 | Temp 98.4°F | Ht 71.0 in | Wt 203.0 lb

## 2019-12-11 DIAGNOSIS — J449 Chronic obstructive pulmonary disease, unspecified: Secondary | ICD-10-CM | POA: Diagnosis not present

## 2019-12-11 MED ORDER — STIOLTO RESPIMAT 2.5-2.5 MCG/ACT IN AERS: 2.0000 | INHALATION_SPRAY | Freq: Every day | RESPIRATORY_TRACT | 0 refills | Status: DC

## 2019-12-11 MED ORDER — STIOLTO RESPIMAT 2.5-2.5 MCG/ACT IN AERS: 2.0000 | INHALATION_SPRAY | Freq: Every day | RESPIRATORY_TRACT | 3 refills | Status: DC

## 2019-12-11 NOTE — Progress Notes (Signed)
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  HPI: 79 year old with history of GERD, hypertension.  He had an evaluation in the ED on 10/15 after a fall with blunt chest trauma.  CT scan of the chest did not reveal any fracture.  But there was a left upper lobe spiculated nodule and he has been referred to pulmonary for further evaluation. Sternal fracture is being managed conservatively.  Noted to have slightly elevated troponin during that admission.  He was evaluated by his cardiologist Dr. Einar Gip with no concern for cardiac ischemia.  He was on Plavix for carotid stenosis.  This has been held and he was started on aspirin.  He underwent cardiac stress test no evidence of ischemia.  Patient had a bronchoscope by Dr. Lamonte Sakai on 09/05/17 with results showing non-small cell lung cancer.   Underwent resection and mediastinal lymph node dissection on September 19, 2017 by Dr. Roxan Hockey. He has 60-pack-year smoking history.  Quit in 2018.  Interim history: Does not like to use the Anoro as it makes his mouth burn He minimizes his symptoms but his daughter-in-law is here with him tells me that he does have significant dyspnea on exertion.  Outpatient Encounter Medications as of 12/11/2019  Medication Sig  . albuterol (PROVENTIL) (2.5 MG/3ML) 0.083% nebulizer solution USE 1 VIAL IN NEBULIZER EVERY 6 HOURS - for wheezing or shortness of breath  . ALPRAZolam (XANAX) 0.5 MG tablet Take 1 tablet (0.5 mg total) by mouth at bedtime.  Marland Kitchen amLODipine (NORVASC) 10 MG tablet Take 0.5 tablets (5 mg total) by mouth daily.  . calcium carbonate (TUMS - DOSED IN MG ELEMENTAL CALCIUM) 500 MG chewable tablet Chew 2 tablets by mouth daily as needed for indigestion or heartburn.   .  cetirizine (ZYRTEC) 10 MG tablet TAKE 1 TABLET BY MOUTH AT BEDTIME (Patient taking differently: Take 10 mg by mouth daily as needed for allergies)  . ezetimibe (ZETIA) 10 MG tablet Take 5 mg by mouth daily.  . fish oil-omega-3 fatty acids 1000 MG capsule Take 1 g by mouth every evening.   Marland Kitchen LIVALO 4 MG TABS Take 0.5 tablets (2 mg total) by mouth daily.  . metoprolol succinate (TOPROL-XL) 25 MG 24 hr tablet TAKE 1 TABLET BY MOUTH  DAILY  . Multiple Vitamins-Minerals (CENTRUM PO) Take 1 tablet by mouth every evening.   . pantoprazole (PROTONIX) 40 MG tablet TAKE 1 TABLET BY MOUTH ONCE DAILY 30 MINUTES BEFORE A  MEAL  . sodium chloride (OCEAN) 0.65 % SOLN nasal spray Place 1 spray into both nostrils as needed for congestion.  Marland Kitchen umeclidinium-vilanterol (ANORO ELLIPTA) 62.5-25 MCG/INH AEPB Inhale 1 puff daily into the lungs.   Facility-Administered Encounter Medications as of 12/11/2019  Medication  . 0.9 %  sodium chloride infusion  . levalbuterol (XOPENEX) nebulizer solution 0.63 mg  . sodium chloride flush (NS) 0.9 % injection 3 mL  . sodium chloride flush (NS) 0.9 % injection 3 mL  . sodium chloride flush (NS) 0.9 % injection 3 mL   Physical Exam: Blood pressure 124/78, pulse 62, temperature 98.4 F (36.9 C), temperature source Temporal, height 5\' 11"  (1.803 m), weight 203 lb (92.1 kg), SpO2 97 %. Gen:      No acute  distress HEENT:  EOMI, sclera anicteric Neck:     No masses; no thyromegaly Lungs:    Clear to auscultation bilaterally; normal respiratory effort CV:         Regular rate and rhythm; no murmurs Abd:      + bowel sounds; soft, non-tender; no palpable masses, no distension Ext:    No edema; adequate peripheral perfusion Skin:      Warm and dry; no rash Neuro: alert and oriented x 3 Psych: normal mood and affect  Data Reviewed: Imaging CT chest 07/16/17-upper sternal fracture with moderate hematoma in the anterior mediastinum, moderate emphysema. 16 mm spiculated left upper  lobe mass.  Ascending aorta aneurysm, lower lobe consolidation.   PET scan 08/10/17-right upper lobe 1.6 cm mass which is hypermetabolic.  Low FDG uptake in the subcarinal lymph node. New small bilateral effusion.  CT scan 05/03/2018- postoperative changes of left upper lobectomy.  No findings of recurrent disease.  Coronary atherosclerosis, emphysema.  CT chest 05/05/2019 stable 8 mm nodule, moderate emphysema, left upper lobe postsurgical changes.  No acute abnormalities. I have reviewed the images personally.  PFTs 09/12/17 FVC 3.89 [90%], FEV1 2.22 (71%], F/F 57, TLC 75%, DLCO 40% Moderate obstruction with minimal restriction and severe diffusion defect.  Path Lung biopsy 09/05/17-.  Left upper lobe brushing and biopsy is positive for non-small cell lung cancer, adenocarcinoma Needle aspiration of lymph nodes 11 L, 7, 4R-negative for malignancy.  Labs CBC 05/03/2018-WBC 6.7, eos 3%, absolute eosinophil count 200  Assessment:  IA adenocarcinoma s/p resection Stable post resection.  Seen by Dr. Julien Nordmann and no additional chemotherapy advised Continue surveillance CT   COPD PFTs reviewed showing moderate obstruction with severe diffusion defect.  No desaturations on exertion Does not like Anoro.  We will switch to [employer]. Use albuterol as needed  Health maintenance 09/26/2017- Pneumovax   Plan/Recommendations: -Change Anoro to [employer].  Follow-up in 1 year.  Marshell Garfinkel MD [facility] 12/11/2019, 12:36 PM  CC: [name], MD

## 2019-12-11 NOTE — Patient Instructions (Signed)
We will give you samples of Stiolto since you do not like the Anoro We will call in a prescription for this.  If you like the Stiolto better then we can refill the prescription Follow-up in 1 year.

## 2019-12-15 ENCOUNTER — Ambulatory Visit: Payer: Medicare Other | Attending: Internal Medicine

## 2019-12-15 DIAGNOSIS — Z23 Encounter for immunization: Secondary | ICD-10-CM

## 2019-12-15 NOTE — Progress Notes (Signed)
   Covid-19 Vaccination Clinic  Name:  Nathaniel Long    MRN: [id number] DOB: [birth year]  12/15/2019  Mr. Nathaniel Long was observed post Covid-19 immunization for 15 minutes without incident. He was provided with Vaccine Information Sheet and instruction to access the V-Safe system.   Mr. Nathaniel Long was instructed to call 911 with any severe reactions post vaccine: Marland Kitchen Difficulty breathing  . Swelling of face and throat  . A fast heartbeat  . A bad rash all over body  . Dizziness and weakness   Immunizations Administered    Name Date Dose VIS Date Route   Pfizer COVID-19 Vaccine 12/15/2019 11:10 AM 0.3 mL 09/12/2019 Intramuscular   Manufacturer: [place]   Lot: [id number]   [place]: [id number]

## 2019-12-21 ENCOUNTER — Other Ambulatory Visit: Payer: Self-pay | Admitting: Cardiology

## 2019-12-30 ENCOUNTER — Telehealth (HOSPITAL_COMMUNITY): Payer: Self-pay

## 2019-12-30 ENCOUNTER — Other Ambulatory Visit: Payer: Self-pay | Admitting: Internal Medicine

## 2019-12-30 ENCOUNTER — Telehealth: Payer: Self-pay | Admitting: Medical Oncology

## 2019-12-30 DIAGNOSIS — C3490 Malignant neoplasm of unspecified part of unspecified bronchus or lung: Secondary | ICD-10-CM

## 2019-12-30 NOTE — Telephone Encounter (Signed)
Done.  We will see him back in 2 weeks.

## 2019-12-30 NOTE — Telephone Encounter (Signed)
Wants to get CT scan and f/u with [facility].  No show 05/2019-[name] said he did not stop by scheduling  11/06/2018 and "did not get t appt" .

## 2020-01-01 ENCOUNTER — Telehealth: Payer: Self-pay | Admitting: Internal Medicine

## 2020-01-01 NOTE — Telephone Encounter (Signed)
Scheduled appt per 3/30 sch message - pt wife aware of appt

## 2020-01-11 NOTE — Progress Notes (Signed)
[facility] OFFICE PROGRESS NOTE  [name], MD [address] [place] [address]  DIAGNOSIS: stage IA(T1a, N0, M0) non-small cell lung cancer, adenocarcinoma presented with left upper lobe lung nodule.  PRIOR THERAPY: Status post left upper lobectomy with lymph node dissection on September 19, 2017 under the care of Dr. Roxan Hockey.  CURRENT THERAPY: Observation  INTERVAL HISTORY: Nathaniel Long 79 y.o. male returns to the clinic for a follow up visit. The patient is status post left upper lobectomy under the care of Dr. Roxan Hockey in December 2018. He was last seen in the clinic for a routine CT scan and follow up in February 2020. He was supposed to follow up in 6 months with a CT scan in 6 months but there was some scheduling issue.   Today, the patient is feeling well today without any concerning complaints.  Denies any fever, chills, night sweats, or weight loss. Denies any chest pain, cough, or hemoptysis except for some mild dyspnea on exertion. Denies any nausea, vomiting, diarrhea, or constipation. Denies any headache or visual changes. He recently had a restaging CT scan. He is here for evaluation and to review his scan results.   MEDICAL HISTORY: Past Medical History:  Diagnosis Date  . Anxiety   . Carotid artery stenosis   . Chronic lower back pain   . CKD (chronic kidney disease)   . Complication of anesthesia    "can't pee when I come out of OR" (07/18/2017)  . Contact with chainsaw as cause of accidental injury 07/16/2017   sternal fracture with bilateral anterior fifth and sixth rib fractures /notes 07/16/2017  . COPD (chronic obstructive pulmonary disease) ([place])   . Dyspnea   . GERD (gastroesophageal reflux disease)   . Hard of hearing   . High cholesterol   . Hypertension   . Thoracic ascending aortic aneurysm (HCC)     ALLERGIES:  is allergic to gabapentin and statins.  MEDICATIONS:  Current Outpatient Medications  Medication Sig  Dispense Refill  . albuterol (PROVENTIL) (2.5 MG/3ML) 0.083% nebulizer solution USE 1 VIAL IN NEBULIZER EVERY 6 HOURS - for wheezing or shortness of breath 75 mL 11  . ALPRAZolam (XANAX) 0.5 MG tablet Take 1 tablet (0.5 mg total) by mouth at bedtime. 30 tablet 5  . amLODipine (NORVASC) 10 MG tablet Take 0.5 tablets (5 mg total) by mouth daily. 90 tablet 1  . calcium carbonate (TUMS - DOSED IN MG ELEMENTAL CALCIUM) 500 MG chewable tablet Chew 2 tablets by mouth daily as needed for indigestion or heartburn.     . cetirizine (ZYRTEC) 10 MG tablet TAKE 1 TABLET BY MOUTH AT BEDTIME (Patient taking differently: Take 10 mg by mouth daily as needed for allergies) 31 tablet 3  . ezetimibe (ZETIA) 10 MG tablet Take 5 mg by mouth daily.  3  . fish oil-omega-3 fatty acids 1000 MG capsule Take 1 g by mouth every evening.     Marland Kitchen LIVALO 4 MG TABS TAKE 1/2 TABLET(2 MG) BY MOUTH DAILY 90 tablet 0  . metoprolol succinate (TOPROL-XL) 25 MG 24 hr tablet TAKE 1 TABLET BY MOUTH  DAILY 90 tablet 3  . Multiple Vitamins-Minerals (CENTRUM PO) Take 1 tablet by mouth every evening.     . pantoprazole (PROTONIX) 40 MG tablet TAKE 1 TABLET BY MOUTH ONCE DAILY 30 MINUTES BEFORE A  MEAL 90 tablet 3  . sodium chloride (OCEAN) 0.65 % SOLN nasal spray Place 1 spray into both nostrils as needed for  congestion.    . Tiotropium Bromide-Olodaterol (STIOLTO RESPIMAT) 2.5-2.5 MCG/ACT AERS Inhale 2 puffs into the lungs daily. 4 g 0   No current facility-administered medications for this visit.   Facility-Administered Medications Ordered in Other Visits  Medication Dose Route Frequency Provider Last Rate Last Admin  . 0.9 %  sodium chloride infusion  250 mL Intravenous PRN Gold, Wayne E, PA-C      . levalbuterol (XOPENEX) nebulizer solution 0.63 mg  0.63 mg Nebulization Q6H PRN Gaye Pollack, MD   0.63 mg at 09/29/17 1646  . sodium chloride flush (NS) 0.9 % injection 3 mL  3 mL Intravenous Q12H Gold, Wayne E, PA-C      . sodium  chloride flush (NS) 0.9 % injection 3 mL  3 mL Intravenous Q12H Gold, Wayne E, PA-C      . sodium chloride flush (NS) 0.9 % injection 3 mL  3 mL Intravenous PRN [name], PA-C        SURGICAL HISTORY:  Past Surgical History:  Procedure Laterality Date  . ABDOMINAL AORTOGRAM W/LOWER EXTREMITY N/A 05/01/2017   Procedure: Abdominal Aortogram w/Lower Extremity;  Surgeon: Adrian Prows, MD;  Location: [facility];  Service: Cardiovascular;  Laterality: N/A;  . BACK SURGERY    . FINGER REPLANTATION Left    "ring finger"  . HEMORRHOID SURGERY    . POSTERIOR LUMBAR FUSION  1986  . RENAL ANGIOGRAPHY Bilateral 12/18/2017   Procedure: RENAL ANGIOGRAPHY;  Surgeon: Nigel Mormon, MD;  Location: [facility];  Service: Cardiovascular;  Laterality: Bilateral;  . VIDEO ASSISTED THORACOSCOPY (VATS)/ LOBECTOMY Left 09/19/2017   Procedure: LEFT VIDEO ASSISTED THORACOSCOPY (VATS)/ LEFT UPPER LOBECTOMY, LYMPH NODE DISECTION;  Surgeon: Melrose Nakayama, MD;  Location: [facility];  Service: Thoracic;  Laterality: Left;  Marland Kitchen VIDEO BRONCHOSCOPY WITH ENDOBRONCHIAL NAVIGATION N/A 09/05/2017   Procedure: VIDEO BRONCHOSCOPY WITH ENDOBRONCHIAL NAVIGATION;  Surgeon: Collene Gobble, MD;  Location: [facility];  Service: Thoracic;  Laterality: N/A;  . VIDEO BRONCHOSCOPY WITH ENDOBRONCHIAL ULTRASOUND N/A 09/05/2017   Procedure: VIDEO BRONCHOSCOPY WITH ENDOBRONCHIAL ULTRASOUND;  Surgeon: Collene Gobble, MD;  Location: [facility];  Service: Thoracic;  Laterality: N/A;    REVIEW OF SYSTEMS:   Review of Systems  Constitutional: Negative for appetite change, chills, fatigue, fever and unexpected weight change.  HENT: Negative for mouth sores, nosebleeds, sore throat and trouble swallowing.   Eyes: Negative for eye problems and icterus.  Respiratory: Positive for mild dyspnea on exertion Negative for cough, hemoptysis, and wheezing.   Cardiovascular: Negative for chest pain and leg swelling.  Gastrointestinal:  Negative for abdominal pain, constipation, diarrhea, nausea and vomiting.  Genitourinary: Negative for bladder incontinence, difficulty urinating, dysuria, frequency and hematuria.   Musculoskeletal: Negative for back pain, gait problem, neck pain and neck stiffness.  Skin: Negative for itching and rash.  Neurological: Negative for dizziness, extremity weakness, gait problem, headaches, light-headedness and seizures.  Hematological: Negative for adenopathy. Does not bruise/bleed easily.  Psychiatric/Behavioral: Negative for confusion, depression and sleep disturbance. The patient is not nervous/anxious.     PHYSICAL EXAMINATION:  Blood pressure 109/60, pulse 60, temperature 98.9 F (37.2 C), temperature source Temporal, resp. rate 20, height 5\' 11"  (1.803 m), weight 202 lb 9.6 oz (91.9 kg), SpO2 97 %.  ECOG PERFORMANCE STATUS: 1 - Symptomatic but completely ambulatory  Physical Exam  Constitutional: Oriented to person, place, and time and well-developed, well-nourished, and in no distress.  HENT:  Head: Normocephalic and atraumatic. Hard  of hearing.  Mouth/Throat: Oropharynx is clear and moist. No oropharyngeal exudate.  Eyes: Conjunctivae are normal. Right eye exhibits no discharge. Left eye exhibits no discharge. No scleral icterus.  Neck: Normal range of motion. Neck supple.  Cardiovascular: Systolic murmur noted. Normal rate, regular rhythm, and intact distal pulses.   Pulmonary/Chest: Effort normal and breath sounds normal. No respiratory distress. No wheezes. No rales.  Abdominal: Soft. Bowel sounds are normal. Exhibits no distension and no mass. There is no tenderness.  Musculoskeletal: Normal range of motion. Exhibits no edema.  Lymphadenopathy:    No cervical adenopathy.  Neurological: Alert and oriented to person, place, and time. Exhibits normal muscle tone. Gait normal. Coordination normal.  Skin: Skin is warm and dry. No rash noted. Not diaphoretic. No erythema. No pallor.   Psychiatric: Mood, memory and judgment normal.  Vitals reviewed.  LABORATORY DATA: Lab Results  Component Value Date   WBC 10.3 01/12/2020   HGB 13.8 01/12/2020   HCT 40.4 01/12/2020   MCV 94.2 01/12/2020   PLT 343 01/12/2020      Chemistry      Component Value Date/Time   NA 138 01/12/2020 1429   K 4.1 01/12/2020 1429   CL 109 01/12/2020 1429   CO2 20 (L) 01/12/2020 1429   BUN 23 01/12/2020 1429   CREATININE 1.74 (H) 01/12/2020 1429   CREATININE 1.17 02/21/2012 1046      Component Value Date/Time   CALCIUM 8.7 (L) 01/12/2020 1429   ALKPHOS 75 01/12/2020 1429   AST 24 01/12/2020 1429   ALT 23 01/12/2020 1429   BILITOT 0.8 01/12/2020 1429       RADIOGRAPHIC STUDIES:  CT CHEST WO CONTRAST  Result Date: 01/12/2020 CLINICAL DATA:  History of lung cancer. EXAM: CT CHEST WITHOUT CONTRAST TECHNIQUE: Multidetector CT imaging of the chest was performed following the standard protocol without IV contrast. COMPARISON:  A 12/20/2018 FINDINGS: Cardiovascular: Calcified atherosclerotic plaque throughout the thoracic aorta. Heart great vessels not well evaluated given lack of intravenous contrast. Aortic caliber at 3.9 cm, unchanged from prior study. Calcified coronary artery disease as before. Central pulmonary vasculature is normal caliber. Left hilar distortion due to partial lung resection as before. Mediastinum/Nodes: Thoracic inlet structures are normal. No axillary lymphadenopathy. No mediastinal or hilar lymphadenopathy. Lungs/Pleura: Post left upper lobectomy. Small amount of pleural fluid and/or pleural thickening at the lung apex similar to the prior study. Pleural and parenchymal scarring in the dependent chest with slightly nodular appearance is unchanged (image 74, series 7) measuring approximately 8-9 mm greatest with. Centrilobular and paraseptal emphysema as before. Upper Abdomen: Adrenal glands are normal. No acute upper abdominal process. Musculoskeletal: No acute  musculoskeletal finding. No destructive bone lesion. Signs of prior trauma to the left hemithorax with healed rib fractures, unchanged. IMPRESSION: 1. Stable post left upper lobectomy changes with small amount of pleural fluid and/or pleural thickening at the lung apex. 2. Stable pleural and parenchymal scarring in the dependent chest, associated with a peripheral nodular area measuring 8-9 mm, attention on follow-up not changed from previous exam. 3. Emphysema and aortic atherosclerosis. Aortic Atherosclerosis (ICD10-I70.0) and Emphysema (ICD10-J43.9). Electronically Signed   By: Zetta Bills M.D.   On: 01/12/2020 16:35     ASSESSMENT/PLAN:  This is a very pleasant 79 year old Caucasian male with a stage Ia non-small cell lung cancer status post left upper lobectomy with lymph node dissection. He was diagnosed in December 2018. He is currently on observation.  The patient is feeling fine  today with no concerning complaints. He had repeat CT scan of the chest performed recently. Dr. Julien Nordmann personally and independently reviewed the scans and discussed the results with the patient today. His scan showed no concerning findings for disease recurrence. We recommended for him to continue on observation with repeat CT scan of the chest without contrast in 1 year.  The patient was advised to call immediately if he has any concerning symptoms in the interval. The patient voices understanding of current disease status and treatment options and is in agreement with the current care plan. All questions were answered. The patient knows to call the clinic with any problems, questions or concerns. We can certainly see the patient much sooner if necessary    Orders Placed This Encounter  Procedures  . CT Chest Wo Contrast    Standing Status:   Future    Standing Expiration Date:   01/12/2021    Order Specific Question:   ** REASON FOR EXAM (FREE TEXT)    Answer:   Restaging Lung Cancer Hx stage IA    Order  Specific Question:   Preferred imaging location?    Answer:   [facility]    Order Specific Question:   Radiology Contrast Protocol - do NOT remove file path    Answer:   [email or web address]  . CBC with Differential ([facility] Only)    Standing Status:   Future    Standing Expiration Date:   01/12/2021  . CMP ([facility] only)    Standing Status:   Future    Standing Expiration Date:   01/12/2021     Tobe Sos Breauna Mazzeo, PA-C 01/13/20   ADDENDUM: Hematology/Oncology Attending: I had a face-to-face encounter with the patient today.  I recommended his care plan.  This is a very pleasant 79 years old white male with history of stage Ia non-small cell lung cancer status post left upper lobectomy with lymph node dissection in December 2018. The patient has been on observation since that time.  He is feeling fine with no concerning complaints. He had repeat CT scan of the chest performed recently.  I personally and independently reviewed the scans and discussed the results with the patient today. His scan showed no concerning findings for disease recurrence or metastasis. I recommended for the patient to continue on observation with repeat CT scan of the chest in 1 year. The patient was advised to call immediately if he has any concerning symptoms in the interval.  Disclaimer: This note was dictated with voice recognition software. Similar sounding words can inadvertently be transcribed and may be missed upon review. Eilleen Kempf, MD 01/13/20

## 2020-01-12 ENCOUNTER — Encounter (HOSPITAL_COMMUNITY): Payer: Self-pay

## 2020-01-12 ENCOUNTER — Ambulatory Visit (HOSPITAL_COMMUNITY)
Admission: RE | Admit: 2020-01-12 | Discharge: 2020-01-12 | Disposition: A | Discharge status: Home / Self Care | Payer: Medicare Other | Source: Ambulatory Visit | Attending: Internal Medicine | Admitting: Internal Medicine

## 2020-01-12 ENCOUNTER — Other Ambulatory Visit: Payer: Self-pay | Admitting: Internal Medicine

## 2020-01-12 ENCOUNTER — Other Ambulatory Visit: Payer: Self-pay

## 2020-01-12 ENCOUNTER — Inpatient Hospital Stay: Payer: Medicare Other | Attending: Internal Medicine

## 2020-01-12 DIAGNOSIS — F419 Anxiety disorder, unspecified: Secondary | ICD-10-CM | POA: Diagnosis not present

## 2020-01-12 DIAGNOSIS — J449 Chronic obstructive pulmonary disease, unspecified: Secondary | ICD-10-CM | POA: Insufficient documentation

## 2020-01-12 DIAGNOSIS — Z79899 Other long term (current) drug therapy: Secondary | ICD-10-CM | POA: Diagnosis not present

## 2020-01-12 DIAGNOSIS — C3490 Malignant neoplasm of unspecified part of unspecified bronchus or lung: Secondary | ICD-10-CM | POA: Diagnosis not present

## 2020-01-12 DIAGNOSIS — N189 Chronic kidney disease, unspecified: Secondary | ICD-10-CM | POA: Diagnosis not present

## 2020-01-12 DIAGNOSIS — C3412 Malignant neoplasm of upper lobe, left bronchus or lung: Secondary | ICD-10-CM | POA: Insufficient documentation

## 2020-01-12 DIAGNOSIS — I129 Hypertensive chronic kidney disease with stage 1 through stage 4 chronic kidney disease, or unspecified chronic kidney disease: Secondary | ICD-10-CM | POA: Diagnosis not present

## 2020-01-12 LAB — CMP (CANCER CENTER ONLY)
ALT: 23 U/L (ref 0–44)
AST: 24 U/L (ref 15–41)
Albumin: 4.2 g/dL (ref 3.5–5.0)
Alkaline Phosphatase: 75 U/L (ref 38–126)
Anion gap: 9 (ref 5–15)
BUN: 23 mg/dL (ref 8–23)
CO2: 20 mmol/L — ABNORMAL LOW (ref 22–32)
Calcium: 8.7 mg/dL — ABNORMAL LOW (ref 8.9–10.3)
Chloride: 109 mmol/L (ref 98–111)
Creatinine: 1.74 mg/dL — ABNORMAL HIGH (ref 0.61–1.24)
GFR, Est AFR Am: 43 mL/min — ABNORMAL LOW (ref >60)
GFR, Estimated: 37 mL/min — ABNORMAL LOW (ref >60)
Glucose, Bld: 97 mg/dL (ref 70–99)
Potassium: 4.1 mmol/L (ref 3.5–5.1)
Sodium: 138 mmol/L (ref 135–145)
Total Bilirubin: 0.8 mg/dL (ref 0.3–1.2)
Total Protein: 7.3 g/dL (ref 6.5–8.1)

## 2020-01-12 LAB — CBC WITH DIFFERENTIAL (CANCER CENTER ONLY)
Abs Immature Granulocytes: 0.03 10*3/uL (ref 0.00–0.07)
Basophils Absolute: 0.1 10*3/uL (ref 0.0–0.1)
Basophils Relative: 1 %
Eosinophils Absolute: 0.2 10*3/uL (ref 0.0–0.5)
Eosinophils Relative: 2 %
HCT: 40.4 % (ref 39.0–52.0)
Hemoglobin: 13.8 g/dL (ref 13.0–17.0)
Immature Granulocytes: 0 %
Lymphocytes Relative: 39 %
Lymphs Abs: 4 10*3/uL (ref 0.7–4.0)
MCH: 32.2 pg (ref 26.0–34.0)
MCHC: 34.2 g/dL (ref 30.0–36.0)
MCV: 94.2 fL (ref 80.0–100.0)
Monocytes Absolute: 0.7 10*3/uL (ref 0.1–1.0)
Monocytes Relative: 7 %
Neutro Abs: 5.2 10*3/uL (ref 1.7–7.7)
Neutrophils Relative %: 51 %
Platelet Count: 343 10*3/uL (ref 150–400)
RBC: 4.29 MIL/uL (ref 4.22–5.81)
RDW: 12.9 % (ref 11.5–15.5)
WBC Count: 10.3 10*3/uL (ref 4.0–10.5)
nRBC: 0 % (ref 0.0–0.2)

## 2020-01-12 MED ORDER — IOHEXOL 300 MG/ML  SOLN: 75.0000 mL | Freq: Once | INTRAMUSCULAR | Status: DC | PRN

## 2020-01-12 MED ORDER — SODIUM CHLORIDE (PF) 0.9 % IJ SOLN
INTRAMUSCULAR | Status: AC
  Filled 2020-01-12: qty 50

## 2020-01-13 ENCOUNTER — Other Ambulatory Visit: Payer: Self-pay

## 2020-01-13 ENCOUNTER — Inpatient Hospital Stay: Payer: Medicare Other | Admitting: Physician Assistant

## 2020-01-13 ENCOUNTER — Encounter: Payer: Self-pay | Admitting: Physician Assistant

## 2020-01-13 VITALS — BP 109/60 | HR 60 | Temp 98.9°F | Resp 20 | Ht 71.0 in | Wt 202.6 lb

## 2020-01-13 DIAGNOSIS — C3412 Malignant neoplasm of upper lobe, left bronchus or lung: Secondary | ICD-10-CM

## 2020-01-14 ENCOUNTER — Telehealth: Payer: Self-pay | Admitting: Physician Assistant

## 2020-01-14 NOTE — Telephone Encounter (Signed)
Scheduled per los. Called and left msg. Mailed printout  °

## 2020-02-02 ENCOUNTER — Other Ambulatory Visit: Payer: Self-pay | Admitting: Cardiology

## 2020-02-02 DIAGNOSIS — I10 Essential (primary) hypertension: Secondary | ICD-10-CM

## 2020-02-03 ENCOUNTER — Other Ambulatory Visit: Payer: Self-pay

## 2020-02-03 DIAGNOSIS — I10 Essential (primary) hypertension: Secondary | ICD-10-CM

## 2020-02-03 MED ORDER — METOPROLOL SUCCINATE ER 25 MG PO TB24: ORAL_TABLET | ORAL | 0 refills | Status: DC

## 2020-02-04 ENCOUNTER — Other Ambulatory Visit: Payer: Self-pay

## 2020-02-04 DIAGNOSIS — I10 Essential (primary) hypertension: Secondary | ICD-10-CM

## 2020-02-04 MED ORDER — METOPROLOL SUCCINATE ER 25 MG PO TB24: 25.0000 mg | ORAL_TABLET | Freq: Every day | ORAL | 1 refills | Status: DC

## 2020-05-07 ENCOUNTER — Other Ambulatory Visit: Payer: Self-pay | Admitting: Cardiology

## 2020-05-17 ENCOUNTER — Other Ambulatory Visit: Payer: Self-pay | Admitting: Cardiology

## 2020-05-17 DIAGNOSIS — F411 Generalized anxiety disorder: Secondary | ICD-10-CM

## 2020-05-17 DIAGNOSIS — F5101 Primary insomnia: Secondary | ICD-10-CM

## 2020-05-18 NOTE — Telephone Encounter (Signed)
Refill request

## 2020-06-17 ENCOUNTER — Observation Stay (HOSPITAL_COMMUNITY)
Admission: EM | Admit: 2020-06-17 | Discharge: 2020-06-19 | Disposition: A | Discharge status: Home / Self Care | Payer: Medicare Other | Attending: Internal Medicine | Admitting: Internal Medicine

## 2020-06-17 ENCOUNTER — Other Ambulatory Visit: Payer: Self-pay

## 2020-06-17 ENCOUNTER — Telehealth: Payer: Self-pay | Admitting: Pulmonary Disease

## 2020-06-17 DIAGNOSIS — J449 Chronic obstructive pulmonary disease, unspecified: Secondary | ICD-10-CM

## 2020-06-17 DIAGNOSIS — I150 Renovascular hypertension: Secondary | ICD-10-CM | POA: Diagnosis present

## 2020-06-17 DIAGNOSIS — Z87891 Personal history of nicotine dependence: Secondary | ICD-10-CM | POA: Diagnosis not present

## 2020-06-17 DIAGNOSIS — Z79899 Other long term (current) drug therapy: Secondary | ICD-10-CM | POA: Insufficient documentation

## 2020-06-17 DIAGNOSIS — I129 Hypertensive chronic kidney disease with stage 1 through stage 4 chronic kidney disease, or unspecified chronic kidney disease: Secondary | ICD-10-CM | POA: Diagnosis not present

## 2020-06-17 DIAGNOSIS — U071 COVID-19: Secondary | ICD-10-CM

## 2020-06-17 DIAGNOSIS — Z85118 Personal history of other malignant neoplasm of bronchus and lung: Secondary | ICD-10-CM | POA: Insufficient documentation

## 2020-06-17 DIAGNOSIS — N183 Chronic kidney disease, stage 3 unspecified: Secondary | ICD-10-CM | POA: Diagnosis not present

## 2020-06-17 DIAGNOSIS — K5900 Constipation, unspecified: Secondary | ICD-10-CM | POA: Diagnosis not present

## 2020-06-17 DIAGNOSIS — K921 Melena: Principal | ICD-10-CM | POA: Insufficient documentation

## 2020-06-17 DIAGNOSIS — K922 Gastrointestinal hemorrhage, unspecified: Secondary | ICD-10-CM

## 2020-06-17 DIAGNOSIS — H9190 Unspecified hearing loss, unspecified ear: Secondary | ICD-10-CM

## 2020-06-17 DIAGNOSIS — R109 Unspecified abdominal pain: Secondary | ICD-10-CM

## 2020-06-17 DIAGNOSIS — Z23 Encounter for immunization: Secondary | ICD-10-CM | POA: Insufficient documentation

## 2020-06-17 HISTORY — DX: Peripheral vascular disease, unspecified: I73.9

## 2020-06-17 LAB — COMPREHENSIVE METABOLIC PANEL
ALT: 44 U/L (ref 0–44)
AST: 57 U/L — ABNORMAL HIGH (ref 15–41)
Albumin: 3.7 g/dL (ref 3.5–5.0)
Alkaline Phosphatase: 65 U/L (ref 38–126)
Anion gap: 12 (ref 5–15)
BUN: 33 mg/dL — ABNORMAL HIGH (ref 8–23)
CO2: 21 mmol/L — ABNORMAL LOW (ref 22–32)
Calcium: 8.7 mg/dL — ABNORMAL LOW (ref 8.9–10.3)
Chloride: 102 mmol/L (ref 98–111)
Creatinine, Ser: 1.54 mg/dL — ABNORMAL HIGH (ref 0.61–1.24)
GFR calc Af Amer: 49 mL/min — ABNORMAL LOW (ref >60)
GFR calc non Af Amer: 43 mL/min — ABNORMAL LOW (ref >60)
Glucose, Bld: 140 mg/dL — ABNORMAL HIGH (ref 70–99)
Potassium: 4.3 mmol/L (ref 3.5–5.1)
Sodium: 135 mmol/L (ref 135–145)
Total Bilirubin: 0.7 mg/dL (ref 0.3–1.2)
Total Protein: 6.9 g/dL (ref 6.5–8.1)

## 2020-06-17 LAB — TYPE AND SCREEN
ABO/RH(D): O POS
Antibody Screen: NEGATIVE

## 2020-06-17 LAB — CBC
HCT: 38.4 % — ABNORMAL LOW (ref 39.0–52.0)
Hemoglobin: 12.6 g/dL — ABNORMAL LOW (ref 13.0–17.0)
MCH: 31.6 pg (ref 26.0–34.0)
MCHC: 32.8 g/dL (ref 30.0–36.0)
MCV: 96.2 fL (ref 80.0–100.0)
Platelets: 338 10*3/uL (ref 150–400)
RBC: 3.99 MIL/uL — ABNORMAL LOW (ref 4.22–5.81)
RDW: 12.6 % (ref 11.5–15.5)
WBC: 15 10*3/uL — ABNORMAL HIGH (ref 4.0–10.5)
nRBC: 0 % (ref 0.0–0.2)

## 2020-06-17 LAB — SARS CORONAVIRUS 2 BY RT PCR (HOSPITAL ORDER, PERFORMED IN ~~LOC~~ HOSPITAL LAB): SARS Coronavirus 2: POSITIVE — AB

## 2020-06-17 NOTE — Telephone Encounter (Signed)
Called and spoke with pt's wife who stated pt tested positive for covid. Pt was tested 9/12 and found out today 9/16 that he tested positive.  Pt began to have symptoms 9/10 including body aches, night sweats, and possibly a fever. Horris Latino stated that she believes pt might have had a fever but pt would not stay still when trying to take temp.  Pt has been taking tylenol. Pt is using his stiolto inhaler as he is directed. Pt has not had to use nebulizer.  Pt denies any complaints of wheezing. While speaking with pt's wife, also started speaking with pt.   Horris Latino said that doctor at [facility] that pt went to was concerned about his breathing but pt said that he was beginning to feel some better. Pt is now beginning to get energy back which is much better than it was 4 days ago as Horris Latino said that pt almost could not get back into the house after going to UC.  Horris Latino said that pt has been having dark bowel movements that is pretty much black in color.   Due to all the info provided and with Dr. Vaughan Browner being out of the office, routing to APP of the day. Tammy, please advise.  Pt has had both of his covid vaccines: Pfizer vaccine on 11/22/19 and 12/15/19.

## 2020-06-17 NOTE — Telephone Encounter (Signed)
Patient having black tarry stools, will need to go to ER for evaluation  If discharged, can call back and look at [facility] infusion   Patient wife aware  Please contact office for sooner follow up if symptoms do not improve or worsen or seek emergency care

## 2020-06-17 NOTE — ED Triage Notes (Signed)
Pt reports being dx with covid yesterday. Having black stool since yesterday with R sided abdominal pain.

## 2020-06-18 ENCOUNTER — Telehealth: Payer: Self-pay | Admitting: Adult Health

## 2020-06-18 ENCOUNTER — Emergency Department (HOSPITAL_COMMUNITY): Payer: Medicare Other

## 2020-06-18 ENCOUNTER — Encounter (HOSPITAL_COMMUNITY): Payer: Self-pay | Admitting: Emergency Medicine

## 2020-06-18 ENCOUNTER — Inpatient Hospital Stay (HOSPITAL_COMMUNITY): Payer: Medicare Other

## 2020-06-18 ENCOUNTER — Encounter (HOSPITAL_COMMUNITY)
Admission: EM | Disposition: A | Discharge status: Home / Self Care | Payer: Self-pay | Source: Home / Self Care | Attending: Emergency Medicine

## 2020-06-18 DIAGNOSIS — K921 Melena: Secondary | ICD-10-CM | POA: Diagnosis not present

## 2020-06-18 DIAGNOSIS — N183 Chronic kidney disease, stage 3 unspecified: Secondary | ICD-10-CM

## 2020-06-18 DIAGNOSIS — J42 Unspecified chronic bronchitis: Secondary | ICD-10-CM

## 2020-06-18 DIAGNOSIS — R195 Other fecal abnormalities: Secondary | ICD-10-CM

## 2020-06-18 DIAGNOSIS — U071 COVID-19: Secondary | ICD-10-CM | POA: Diagnosis not present

## 2020-06-18 DIAGNOSIS — K922 Gastrointestinal hemorrhage, unspecified: Secondary | ICD-10-CM | POA: Diagnosis present

## 2020-06-18 DIAGNOSIS — H9190 Unspecified hearing loss, unspecified ear: Secondary | ICD-10-CM

## 2020-06-18 DIAGNOSIS — R109 Unspecified abdominal pain: Secondary | ICD-10-CM

## 2020-06-18 DIAGNOSIS — J449 Chronic obstructive pulmonary disease, unspecified: Secondary | ICD-10-CM

## 2020-06-18 DIAGNOSIS — Z23 Encounter for immunization: Secondary | ICD-10-CM | POA: Diagnosis not present

## 2020-06-18 LAB — COMPREHENSIVE METABOLIC PANEL
ALT: 36 U/L (ref 0–44)
AST: 40 U/L (ref 15–41)
Albumin: 3.4 g/dL — ABNORMAL LOW (ref 3.5–5.0)
Alkaline Phosphatase: 54 U/L (ref 38–126)
Anion gap: 10 (ref 5–15)
BUN: 28 mg/dL — ABNORMAL HIGH (ref 8–23)
CO2: 19 mmol/L — ABNORMAL LOW (ref 22–32)
Calcium: 8.2 mg/dL — ABNORMAL LOW (ref 8.9–10.3)
Chloride: 106 mmol/L (ref 98–111)
Creatinine, Ser: 1.56 mg/dL — ABNORMAL HIGH (ref 0.61–1.24)
GFR calc Af Amer: 49 mL/min — ABNORMAL LOW (ref >60)
GFR calc non Af Amer: 42 mL/min — ABNORMAL LOW (ref >60)
Glucose, Bld: 116 mg/dL — ABNORMAL HIGH (ref 70–99)
Potassium: 4.3 mmol/L (ref 3.5–5.1)
Sodium: 135 mmol/L (ref 135–145)
Total Bilirubin: 0.8 mg/dL (ref 0.3–1.2)
Total Protein: 6.7 g/dL (ref 6.5–8.1)

## 2020-06-18 LAB — CBC
HCT: 35.1 % — ABNORMAL LOW (ref 39.0–52.0)
HCT: 36.5 % — ABNORMAL LOW (ref 39.0–52.0)
Hemoglobin: 11.4 g/dL — ABNORMAL LOW (ref 13.0–17.0)
Hemoglobin: 11.8 g/dL — ABNORMAL LOW (ref 13.0–17.0)
MCH: 31.7 pg (ref 26.0–34.0)
MCH: 31.6 pg (ref 26.0–34.0)
MCHC: 32.5 g/dL (ref 30.0–36.0)
MCHC: 32.3 g/dL (ref 30.0–36.0)
MCV: 97.5 fL (ref 80.0–100.0)
MCV: 97.9 fL (ref 80.0–100.0)
Platelets: 315 10*3/uL (ref 150–400)
Platelets: 324 10*3/uL (ref 150–400)
RBC: 3.6 MIL/uL — ABNORMAL LOW (ref 4.22–5.81)
RBC: 3.73 MIL/uL — ABNORMAL LOW (ref 4.22–5.81)
RDW: 12.8 % (ref 11.5–15.5)
RDW: 12.7 % (ref 11.5–15.5)
WBC: 15.3 10*3/uL — ABNORMAL HIGH (ref 4.0–10.5)
WBC: 15.3 10*3/uL — ABNORMAL HIGH (ref 4.0–10.5)
nRBC: 0 % (ref 0.0–0.2)
nRBC: 0 % (ref 0.0–0.2)

## 2020-06-18 LAB — POC OCCULT BLOOD, ED: Fecal Occult Bld: POSITIVE — AB

## 2020-06-18 SURGERY — ESOPHAGOGASTRODUODENOSCOPY (EGD) WITH PROPOFOL
Anesthesia: Monitor Anesthesia Care

## 2020-06-18 MED ORDER — ALBUTEROL SULFATE HFA 108 (90 BASE) MCG/ACT IN AERS
2.0000 | INHALATION_SPRAY | Freq: Once | RESPIRATORY_TRACT | Status: DC | PRN
  Filled 2020-06-18: qty 6.7

## 2020-06-18 MED ORDER — PANTOPRAZOLE SODIUM 40 MG IV SOLR: 40.0000 mg | Freq: Two times a day (BID) | INTRAVENOUS | Status: DC

## 2020-06-18 MED ORDER — SODIUM CHLORIDE 0.9 % IV SOLN
8.0000 mg/h | INTRAVENOUS | Status: DC
  Administered 2020-06-18 – 2020-06-19 (×3): 8 mg/h via INTRAVENOUS
  Filled 2020-06-18 (×5): qty 80

## 2020-06-18 MED ORDER — IPRATROPIUM-ALBUTEROL 20-100 MCG/ACT IN AERS
1.0000 | INHALATION_SPRAY | Freq: Four times a day (QID) | RESPIRATORY_TRACT | Status: DC
  Administered 2020-06-18: 1 via RESPIRATORY_TRACT
  Filled 2020-06-18: qty 4

## 2020-06-18 MED ORDER — FAMOTIDINE IN NACL 20-0.9 MG/50ML-% IV SOLN
20.0000 mg | Freq: Once | INTRAVENOUS | Status: DC | PRN
  Filled 2020-06-18: qty 50

## 2020-06-18 MED ORDER — METOPROLOL SUCCINATE ER 25 MG PO TB24
25.0000 mg | ORAL_TABLET | Freq: Every day | ORAL | Status: DC
  Administered 2020-06-18: 25 mg via ORAL
  Filled 2020-06-18 (×2): qty 1

## 2020-06-18 MED ORDER — EPINEPHRINE 0.3 MG/0.3ML IJ SOAJ
0.3000 mg | Freq: Once | INTRAMUSCULAR | Status: DC | PRN
  Filled 2020-06-18: qty 0.6

## 2020-06-18 MED ORDER — AMLODIPINE BESYLATE 5 MG PO TABS
5.0000 mg | ORAL_TABLET | Freq: Every day | ORAL | Status: DC
  Administered 2020-06-18 – 2020-06-19 (×2): 5 mg via ORAL
  Filled 2020-06-18 (×2): qty 1

## 2020-06-18 MED ORDER — ONDANSETRON HCL 4 MG PO TABS: 4.0000 mg | ORAL_TABLET | Freq: Four times a day (QID) | ORAL | Status: DC | PRN

## 2020-06-18 MED ORDER — SODIUM CHLORIDE 0.9 % IV SOLN
1200.0000 mg | Freq: Once | INTRAVENOUS | Status: AC
  Administered 2020-06-18: 1200 mg via INTRAVENOUS
  Filled 2020-06-18: qty 10

## 2020-06-18 MED ORDER — EZETIMIBE 10 MG PO TABS
5.0000 mg | ORAL_TABLET | Freq: Every day | ORAL | Status: DC
  Administered 2020-06-18 – 2020-06-19 (×2): 5 mg via ORAL
  Filled 2020-06-18: qty 1
  Filled 2020-06-18: qty 0.5

## 2020-06-18 MED ORDER — IOHEXOL 300 MG/ML  SOLN
100.0000 mL | Freq: Once | INTRAMUSCULAR | Status: AC | PRN
  Administered 2020-06-18: 100 mL via INTRAVENOUS

## 2020-06-18 MED ORDER — LACTATED RINGERS IV SOLN: INTRAVENOUS | Status: DC

## 2020-06-18 MED ORDER — SENNA 8.6 MG PO TABS: 2.0000 | ORAL_TABLET | Freq: Every day | ORAL | Status: DC

## 2020-06-18 MED ORDER — METHYLPREDNISOLONE SODIUM SUCC 125 MG IJ SOLR: 125.0000 mg | Freq: Once | INTRAMUSCULAR | Status: DC | PRN

## 2020-06-18 MED ORDER — SODIUM CHLORIDE 0.9 % IV SOLN
80.0000 mg | Freq: Once | INTRAVENOUS | Status: AC
  Administered 2020-06-18: 07:00:00 80 mg via INTRAVENOUS
  Filled 2020-06-18: qty 80

## 2020-06-18 MED ORDER — ACETAMINOPHEN 325 MG PO TABS: 650.0000 mg | ORAL_TABLET | Freq: Four times a day (QID) | ORAL | Status: DC | PRN

## 2020-06-18 MED ORDER — SODIUM CHLORIDE 0.9 % IV SOLN: INTRAVENOUS | Status: DC | PRN

## 2020-06-18 MED ORDER — ACETAMINOPHEN 650 MG RE SUPP: 650.0000 mg | Freq: Four times a day (QID) | RECTAL | Status: DC | PRN

## 2020-06-18 MED ORDER — POLYETHYLENE GLYCOL 3350 17 G PO PACK
17.0000 g | PACK | Freq: Two times a day (BID) | ORAL | Status: DC
  Administered 2020-06-18 – 2020-06-19 (×2): 17 g via ORAL
  Filled 2020-06-18 (×2): qty 1

## 2020-06-18 MED ORDER — DIPHENHYDRAMINE HCL 50 MG/ML IJ SOLN: 50.0000 mg | Freq: Once | INTRAMUSCULAR | Status: DC | PRN

## 2020-06-18 MED ORDER — ONDANSETRON HCL 4 MG/2ML IJ SOLN: 4.0000 mg | Freq: Four times a day (QID) | INTRAMUSCULAR | Status: DC | PRN

## 2020-06-18 NOTE — Care Management Obs Status (Signed)
[facility] NOTIFICATION   Patient Details  Name: Nathaniel Long MRN: [id number] Date of Birth: 01/04/1941   Medicare Observation Status Notification Given:  Ernesta Amble, RN 06/18/2020, 5:36 PM

## 2020-06-18 NOTE — Telephone Encounter (Signed)
error 

## 2020-06-18 NOTE — ED Provider Notes (Signed)
[facility] EMERGENCY DEPARTMENT Provider Note   CSN: [id number] Arrival date & time: 06/17/20  1808     History No chief complaint on file.   Nathaniel Long is a 79 y.o. male.  The history is provided by the patient.  Diarrhea Quality:  Black and tarry Severity:  Moderate Onset quality:  Gradual Duration:  1 week Timing:  Constant Progression:  Unchanged Relieved by:  Nothing Worsened by:  Nothing Ineffective treatments:  None tried Associated symptoms: URI   Associated symptoms: no fever   Risk factors: no recent antibiotic use   Patient who hasn't been feeling well since working on his boat over a week ago.  It hurts in the area he was leaning over the boat to work.  Stated he was constipated so he took mineral oil and MOM and is now having black liquid stools.  Was tested on 9/12 for covid given his symptoms for several days.       Past Medical History:  Diagnosis Date  . Anxiety   . Carotid artery stenosis   . Chronic lower back pain   . CKD (chronic kidney disease)   . Complication of anesthesia    "can't pee when I come out of OR" (07/18/2017)  . Contact with chainsaw as cause of accidental injury 07/16/2017   sternal fracture with bilateral anterior fifth and sixth rib fractures /notes 07/16/2017  . COPD (chronic obstructive pulmonary disease) ([place])   . Dyspnea   . GERD (gastroesophageal reflux disease)   . Hard of hearing   . High cholesterol   . Hypertension   . Thoracic ascending aortic aneurysm [facility])     Patient Active Problem List   Diagnosis Date Noted  . 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  . Right renal artery stenosis ([place]) 12/17/2017  . Hydropneumothorax 09/28/2017  . S/P lobectomy of lung 09/19/2017  . Malignant neoplasm of upper lobe of left lung ([place]) 09/13/2017  . Pulmonary nodule, left 09/05/2017  . Mediastinal lymphadenopathy 09/05/2017  . Multiple rib fractures 07/16/2017  . Spondylolisthesis, lumbar region  06/18/2017  . Right hip pain 05/23/2017  . Chronic bilateral low back pain with bilateral sciatica 05/23/2017  . Colon cancer screening 01/12/2017  . Antiplatelet or antithrombotic long-term use 01/12/2017  . PAD (peripheral artery disease) ([place]) 04/13/2011  . Murmur, heart 12/08/2010  . ACTINIC KERATOSIS, HEAD 07/14/2010  . BRUISED ANKLE 07/14/2010  . HYPERTENSION, BENIGN 11/19/2009  . TOBACCO ABUSE 10/28/2009  . EAR PAIN, LEFT 10/28/2009    Past Surgical History:  Procedure Laterality Date  . ABDOMINAL AORTOGRAM W/LOWER EXTREMITY N/A 05/01/2017   Procedure: Abdominal Aortogram w/Lower Extremity;  Surgeon: Adrian Prows, MD;  Location: [place] [facility];  Service: Cardiovascular;  Laterality: N/A;  . BACK SURGERY    . FINGER REPLANTATION Left    "ring finger"  . HEMORRHOID SURGERY    . POSTERIOR LUMBAR FUSION  1986  . RENAL ANGIOGRAPHY Bilateral 12/18/2017   Procedure: RENAL ANGIOGRAPHY;  Surgeon: Nigel Mormon, MD;  Location: [place] [facility];  Service: Cardiovascular;  Laterality: Bilateral;  . VIDEO ASSISTED THORACOSCOPY (VATS)/ LOBECTOMY Left 09/19/2017   Procedure: LEFT VIDEO ASSISTED THORACOSCOPY (VATS)/ LEFT UPPER LOBECTOMY, LYMPH NODE DISECTION;  Surgeon: Melrose Nakayama, MD;  Location: [place];  Service: Thoracic;  Laterality: Left;  Marland Kitchen VIDEO BRONCHOSCOPY WITH ENDOBRONCHIAL NAVIGATION N/A 09/05/2017   Procedure: VIDEO BRONCHOSCOPY WITH ENDOBRONCHIAL NAVIGATION;  Surgeon: Collene Gobble, MD;  Location: [place];  Service: Thoracic;  Laterality: N/A;  .  VIDEO BRONCHOSCOPY WITH ENDOBRONCHIAL ULTRASOUND N/A 09/05/2017   Procedure: VIDEO BRONCHOSCOPY WITH ENDOBRONCHIAL ULTRASOUND;  Surgeon: Collene Gobble, MD;  Location: [facility];  Service: Thoracic;  Laterality: N/A;       Family History  Problem Relation Age of Onset  . Colon polyps Brother   . Dementia Sister     Social History   Tobacco Use  . Smoking status: Former Smoker    Packs/day: 1.00    Years: 33.00     Pack years: 33.00    Types: Cigarettes    Quit date: 04/17/2017    Years since quitting: 3.1  . Smokeless tobacco: Never Used  . Tobacco comment: May, 2018  Vaping Use  . Vaping Use: Never used  Substance Use Topics  . Alcohol use: Yes    Comment: last 95  . Drug use: No    Home Medications Prior to Admission medications   Medication Sig Start Date End Date Taking? Authorizing Provider  albuterol (PROVENTIL) (2.5 MG/3ML) 0.083% nebulizer solution USE 1 VIAL IN NEBULIZER EVERY 6 HOURS - for wheezing or shortness of breath 11/13/19   Mannam, Praveen, MD  ALPRAZolam (XANAX) 0.5 MG tablet TAKE 1 TABLET BY MOUTH AT BEDTIME. 05/18/20   Adrian Prows, MD  amLODipine (NORVASC) 10 MG tablet TAKE 1/2 TABLET BY MOUTH DAILY 05/07/20   Adrian Prows, MD  calcium carbonate (TUMS - DOSED IN MG ELEMENTAL CALCIUM) 500 MG chewable tablet Chew 2 tablets by mouth daily as needed for indigestion or heartburn.     [provider]  cetirizine (ZYRTEC) 10 MG tablet TAKE 1 TABLET BY MOUTH AT BEDTIME Patient taking differently: Take 10 mg by mouth daily as needed for allergies 06/16/11   Lyndal Pulley, DO  ezetimibe (ZETIA) 10 MG tablet Take 5 mg by mouth daily. 08/19/18   [provider]  fish oil-omega-3 fatty acids 1000 MG capsule Take 1 g by mouth every evening.     [provider]  LIVALO 4 MG TABS TAKE 1/2 TABLET(2 MG) BY MOUTH DAILY 12/22/19   Miquel Dunn, NP  metoprolol succinate (TOPROL XL) 25 MG 24 hr tablet Take 1 tablet (25 mg total) by mouth daily. 02/04/20   Adrian Prows, MD  Multiple Vitamins-Minerals (CENTRUM PO) Take 1 tablet by mouth every evening.     [provider]  pantoprazole (PROTONIX) 40 MG tablet TAKE 1 TABLET BY MOUTH ONCE DAILY 30 MINUTES BEFORE A  MEAL 12/02/19   Adrian Prows, MD  sodium chloride (OCEAN) 0.65 % SOLN nasal spray Place 1 spray into both nostrils as needed for congestion.    [provider]  Tiotropium Bromide-Olodaterol (STIOLTO  RESPIMAT) 2.5-2.5 MCG/ACT AERS Inhale 2 puffs into the lungs daily. 12/11/19   Marshell Garfinkel, MD    Allergies    Gabapentin and Statins  Review of Systems   Review of Systems  Constitutional: Positive for fatigue. Negative for fever.  HENT: Negative for congestion.   Eyes: Negative for visual disturbance.  Respiratory: Positive for cough.   Gastrointestinal: Positive for diarrhea.  Genitourinary: Negative for difficulty urinating.  Musculoskeletal: Negative for back pain.  Neurological: Negative for dizziness.  Psychiatric/Behavioral: Negative for agitation.  All other systems reviewed and are negative.   Physical Exam Updated Vital Signs BP 132/70 (BP Location: Left Arm)   Pulse 61   Temp 98.3 F (36.8 C) (Oral)   Resp 18   Ht 5\' 11"  (1.803 m)   Wt 91.6 kg   SpO2 92%  BMI 28.17 kg/m   Physical Exam Vitals and nursing note reviewed. Exam conducted with a chaperone present.  Constitutional:      General: He is not in acute distress.    Appearance: Normal appearance.  HENT:     Head: Normocephalic and atraumatic.     Nose: Nose normal.  Eyes:     Conjunctiva/sclera: Conjunctivae normal.     Pupils: Pupils are equal, round, and reactive to light.  Cardiovascular:     Rate and Rhythm: Normal rate and regular rhythm.     Pulses: Normal pulses.     Heart sounds: Normal heart sounds.  Pulmonary:     Effort: Pulmonary effort is normal.     Breath sounds: Normal breath sounds.  Abdominal:     Palpations: Abdomen is soft.     Tenderness: There is no abdominal tenderness. There is no guarding or rebound.     Hernia: No hernia is present.     Comments: Gassy throughout   Musculoskeletal:        General: Normal range of motion.     Cervical back: Normal range of motion and neck supple.  Skin:    General: Skin is warm and dry.     Capillary Refill: Capillary refill takes less than 2 seconds.  Neurological:     General: No focal deficit present.     Mental Status:  He is alert and oriented to person, place, and time.     Deep Tendon Reflexes: Reflexes normal.  Psychiatric:        Mood and Affect: Mood normal.        Behavior: Behavior normal.     ED Results / Procedures / Treatments   Labs (all labs ordered are listed, but only abnormal results are displayed) Results for orders placed or performed during the hospital encounter of 06/17/20  SARS Coronavirus 2 by RT PCR (hospital order, performed in [place] hospital lab) Nasopharyngeal Nasopharyngeal Swab   Specimen: Nasopharyngeal Swab  Result Value Ref Range   SARS Coronavirus 2 POSITIVE (A) NEGATIVE  Comprehensive metabolic panel  Result Value Ref Range   Sodium 135 135 - 145 mmol/L   Potassium 4.3 3.5 - 5.1 mmol/L   Chloride 102 98 - 111 mmol/L   CO2 21 (L) 22 - 32 mmol/L   Glucose, Bld 140 (H) 70 - 99 mg/dL   BUN 33 (H) 8 - 23 mg/dL   Creatinine, Ser 1.54 (H) 0.61 - 1.24 mg/dL   Calcium 8.7 (L) 8.9 - 10.3 mg/dL   Total Protein 6.9 6.5 - 8.1 g/dL   Albumin 3.7 3.5 - 5.0 g/dL   AST 57 (H) 15 - 41 U/L   ALT 44 0 - 44 U/L   Alkaline Phosphatase 65 38 - 126 U/L   Total Bilirubin 0.7 0.3 - 1.2 mg/dL   GFR calc non Af Amer 43 (L) >60 mL/min   GFR calc Af Amer 49 (L) >60 mL/min   Anion gap 12 5 - 15  CBC  Result Value Ref Range   WBC 15.0 (H) 4.0 - 10.5 K/uL   RBC 3.99 (L) 4.22 - 5.81 MIL/uL   Hemoglobin 12.6 (L) 13.0 - 17.0 g/dL   HCT 38.4 (L) 39 - 52 %   MCV 96.2 80.0 - 100.0 fL   MCH 31.6 26.0 - 34.0 pg   MCHC 32.8 30.0 - 36.0 g/dL   RDW 12.6 11.5 - 15.5 %   Platelets 338 150 - 400 K/uL  nRBC 0.0 0.0 - 0.2 %  POC occult blood, ED  Result Value Ref Range   Fecal Occult Bld POSITIVE (A) NEGATIVE  Type and screen [place]  Result Value Ref Range   ABO/RH(D) O POS    Antibody Screen NEG    Sample Expiration      06/20/2020,2359 Performed at [facility], [place] [address] [place], [place] [address]    No results  found.  EKG None  Radiology No results found.  Procedures Procedures (including critical care time)  Medications Ordered in ED Medications - No data to display  ED Course  I have reviewed the triage vital signs and the nursing notes.  Pertinent labs & imaging results that were available during my care of the patient were reviewed by me and considered in my medical decision making (see chart for details).    Patient has normal breathing mechanics but has covid.  I have started protonix drip and messaged Dr. Tarri Glenn of GI to consult on the patient's case (530 am).  MDM Reviewed: previous chart, nursing note and vitals Reviewed previous: labs Interpretation: labs and x-ray (hemoglobin 1 gram lower than baseline, hemoccult positive.  Covid and chronic changes on CXR, No obstruction on AXR by me  ) Total time providing critical care: 30-74 minutes (protonix drip ). This excludes time spent performing separately reportable procedures and services. Consults: admitting MD  CRITICAL CARE Performed by: Abagayle Klutts K Dayelin Balducci-Rasch Total critical care time: 30 minutes Critical care time was exclusive of separately billable procedures and treating other patients. Critical care was necessary to treat or prevent imminent or life-threatening deterioration. Critical care was time spent personally by me on the following activities: development of treatment plan with patient and/or surrogate as well as nursing, discussions with consultants, evaluation of patient's response to treatment, examination of patient, obtaining history from patient or surrogate, ordering and performing treatments and interventions, ordering and review of laboratory studies, ordering and review of radiographic studies, pulse oximetry and re-evaluation of patient's condition.  Final Clinical Impression(s) / ED Diagnoses Final diagnoses:  [id number]    Admit to medicine for melena.  Patient is stable from a respiratory standpoint  even in the face of Fransico Meadow, Aveen Stansel, MD 06/18/20 [phone]

## 2020-06-18 NOTE — Progress Notes (Addendum)
PROGRESS NOTE                                                                                                                                                                                                             Patient Demographics:    Nathaniel Long, is a 79 y.o. male, DOB - 1941-04-09, JHE:[id number]  Outpatient Primary MD for the patient is [name], MD   Admit date - 06/17/2020   LOS - 0  No chief complaint on file.      Brief Narrative: Patient is a 79 y.o. male with PMHx of HTN, HLD, CKD stage IIIb, COPD-tested positive for COVID-19 on 9/12-presented to the ED on 9/16 with black stools-subsequently admitted to the hospital for further evaluation and treatment.  COVID-19 vaccinated status: Vaccinated  Significant Events: 9/12>> COVID-19 positive 9/16>> Admit to [facility] for evaluation of melena  Significant studies: 9/16>>Chest x-ray: Left basilar scarring, no active disease 9/16>> x-ray abdomen: Lucency around the liver edge and upper abdomen 9/17>> CT abdomen/pelvis: No acute intra-abdominal findings, interstitial opacity in the lower lobes.  COVID-19 medications: None  Antibiotics: None  Microbiology data: None  Procedures: None  Consults: None  DVT prophylaxis: SCDs Start: 06/18/20 1031     Subjective:    Nathaniel Long today is extremely hard of hearing-even with his hearing aids on-he however appears comfortable-on room air.  No distress at rest.   Assessment  & Plan :   Upper GI bleeding with mild acute blood loss anemia: No overt bleeding this morning-apparently has had few episodes of black stools over the past few days-this is in the setting of constipation.  Some mild drop in hemoglobin-but no indication to transfuse at this point.  Continue PPI-evaluated by GI with recommendations to observe for active bleeding before proceeding with endoscopic evaluation.   COVID-19 infection:  Currently asymptomatic-does not have cough or shortness of breath-on room air.  Have consulted pharmacy for monoclonal antibody infusion.  Note-patient is fully vaccinated.  CKD stage IIIb: Creatinine close to baseline-follow.  COPD: No exacerbation-continue bronchodilators.  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  ABG:    Component Value Date/Time   PHART 7.348 (L) 09/20/2017 0515   PCO2ART 40.6 09/20/2017 0515   PO2ART 109 (H) 09/20/2017 0515  HCO3 21.8 09/20/2017 0515   ACIDBASEDEF 3.0 (H) 09/20/2017 0515   O2SAT 98.2 09/20/2017 0515    Vent Settings: N/A    Condition -Guarded  Family Communication  :  Spouse Harland Aguiniga [phone] on 9/17  Addendum-spoke with spouse this afternoon.  Code Status :  Full Code  Diet :  Diet Order            Diet NPO time specified  Diet effective now                  Disposition Plan  :   Status is: Inpatient  Remains inpatient appropriate because:Inpatient level of care appropriate due to severity of illness   Dispo: The patient is from: Home              Anticipated d/c is to: Home              Anticipated d/c date is: 2 days              Patient currently is not medically stable to d/c.   Barriers to discharge: Hypoxia requiring O2 supplementation/complete 5 days of IV Remdesivir  Antimicorbials  :    Anti-infectives (From admission, onward)   None      Inpatient Medications  Scheduled Meds: . amLODipine  5 mg Oral Daily  . ezetimibe  5 mg Oral Daily  . Ipratropium-Albuterol  1 puff Inhalation QID  . metoprolol succinate  25 mg Oral Daily  . [START ON 06/21/2020] pantoprazole  40 mg Intravenous Q12H   Continuous Infusions: . sodium chloride    . casirivimab-imdevimab (REGEN-COV) IVPB    . famotidine (PEPCID) IV    . lactated ringers    . pantoprozole (PROTONIX) infusion 8 mg/hr  (06/18/20 0830)   PRN Meds:.sodium chloride, acetaminophen **OR** acetaminophen, albuterol, diphenhydrAMINE, EPINEPHrine, famotidine (PEPCID) IV, methylPREDNISolone (SOLU-MEDROL) injection, ondansetron **OR** ondansetron (ZOFRAN) IV   Time Spent in minutes  25 See all Orders from today for further details   Oren Binet M.D on 06/18/2020 at 11:31 AM  To page go to [email or web address] - use universal password  [facility] -  Office  [phone]    Objective:   Vitals:   06/18/20 0945 06/18/20 1000 06/18/20 1124 06/18/20 1129  BP: (!) 171/69 (!) 167/72 (!) 156/66 (!) 175/70  Pulse: 60 (!) 56 (!) 58 88  Resp:    13  Temp:      TempSrc:      SpO2: 97% 94%  93%  Weight:      Height:        Wt Readings from Last 3 Encounters:  06/17/20 91.6 kg  01/13/20 91.9 kg  12/11/19 92.1 kg     Intake/Output Summary (Last 24 hours) at 06/18/2020 1131 Last data filed at 06/18/2020 0730 Gross per 24 hour  Intake 100 ml  Output --  Net 100 ml     Physical Exam Gen Exam:Alert awake-not in any distress HEENT:atraumatic, normocephalic Chest: B/L clear to auscultation anteriorly CVS:S1S2 regular Abdomen:soft non tender, non distended Extremities:no edema Neurology: Non focal Skin: no rash   Data Review:    CBC Recent Labs  Lab 06/17/20 1852 06/18/20 0832 06/18/20 1058  WBC 15.0* 15.3* 15.3*  HGB 12.6* 11.4* 11.8*  HCT 38.4* 35.1* 36.5*  PLT 338 315 324  MCV 96.2 97.5 97.9  MCH 31.6 31.7 31.6  MCHC 32.8 32.5 32.3  RDW 12.6 12.8 12.7    Chemistries  Recent Labs  Lab 06/17/20 1852  NA 135  K 4.3  CL 102  CO2 21*  GLUCOSE 140*  BUN 33*  CREATININE 1.54*  CALCIUM 8.7*  AST 57*  ALT 44  ALKPHOS 65  BILITOT 0.7   ------------------------------------------------------------------------------------------------------------------ No results for input(s): CHOL, HDL, LDLCALC, TRIG, CHOLHDL, LDLDIRECT in the last 72 hours.  Lab Results  Component Value Date    HGBA1C 6.0 02/21/2012   ------------------------------------------------------------------------------------------------------------------ No results for input(s): TSH, T4TOTAL, T3FREE, THYROIDAB in the last 72 hours.  Invalid input(s): FREET3 ------------------------------------------------------------------------------------------------------------------ No results for input(s): VITAMINB12, FOLATE, FERRITIN, TIBC, IRON, RETICCTPCT in the last 72 hours.  Coagulation profile No results for input(s): INR, PROTIME in the last 168 hours.  No results for input(s): DDIMER in the last 72 hours.  Cardiac Enzymes No results for input(s): CKMB, TROPONINI, MYOGLOBIN in the last 168 hours.  Invalid input(s): CK ------------------------------------------------------------------------------------------------------------------ No results found for: BNP  Micro Results Recent Results (from the past 240 hour(s))  SARS Coronavirus 2 by RT PCR (hospital order, performed in [facility] hospital lab) Nasopharyngeal Nasopharyngeal Swab     Status: Abnormal   Collection Time: 06/17/20  6:44 PM   Specimen: Nasopharyngeal Swab  Result Value Ref Range Status   SARS Coronavirus 2 POSITIVE (A) NEGATIVE Final    Comment: RESULT CALLED TO, READ BACK BY AND VERIFIED WITH: S,TOWNES @2011  06/17/20 EB (NOTE) SARS-CoV-2 target nucleic acids are DETECTED  SARS-CoV-2 RNA is generally detectable in upper respiratory specimens  during the acute phase of infection.  Positive results are indicative  of the presence of the identified virus, but do not rule out bacterial infection or co-infection with other pathogens not detected by the test.  Clinical correlation with patient history and  other diagnostic information is necessary to determine patient infection status.  The expected result is negative.  Fact Sheet for Patients:   StrictlyIdeas.no   Fact Sheet for Healthcare Providers:    BankingDealers.co.za    This test is not yet approved or cleared by the [place] FDA and  has been authorized for detection and/or diagnosis of SARS-CoV-2 by FDA under an Emergency Use Authorization (EUA).  This EUA will remain in effect (meaning this test can be  used) for the duration of  the COVID-19 declaration under Section 564(b)(1) of the Act, 21 U.S.C. section 360-bbb-3(b)(1), unless the authorization is terminated or revoked sooner.  Performed at [facility], [place] [address], [place], [place] [address]     Radiology Reports DG Abdomen 1 View  Result Date: 06/18/2020 CLINICAL DATA:  COVID.  Abdominal pain EXAM: ABDOMEN - 1 VIEW COMPARISON:  None. FINDINGS: Gas throughout nondistended large and small bowel. No evidence of bowel obstruction or organomegaly. No suspicious calcification or acute bony abnormality. Lucency noted a adjacent to the liver and centrally in the upper chest. While no free air is seen under the diaphragms on today's chest x-ray, cannot exclude pneumoperitoneum. IMPRESSION: Lucency around the liver edge and in the upper abdomen at the midline. Cannot exclude free air. Recommend upright or decubitus abdominal view. Electronically Signed   By: Rolm Baptise M.D.   On: 06/18/2020 05:57   CT ABDOMEN PELVIS W CONTRAST  Result Date: 06/18/2020 CLINICAL DATA:  Bloody stool and diffuse abdominal pain EXAM: CT ABDOMEN AND PELVIS WITH CONTRAST TECHNIQUE: Multidetector CT imaging of the abdomen and pelvis was performed using the standard protocol following bolus administration of intravenous contrast. CONTRAST:  151mL OMNIPAQUE IOHEXOL 300 MG/ML  SOLN COMPARISON:  Head CT 08/10/2017 FINDINGS: Lower chest:  Reticulation in the lower lobes which has a chronic appearance but has occurred since January 12, 2020 CT. Aortic valve calcification. Coronary atherosclerosis. Hepatobiliary: Hepatic steatosis.No evidence of biliary obstruction or stone.  Pancreas: Unremarkable. Spleen: Unremarkable. Adrenals/Urinary Tract: Negative adrenals. No hydronephrosis or stone. Unremarkable bladder. Stomach/Bowel: No obstruction. No appendicitis or other visible inflammation. No notable diverticulosis, especially for age. Vascular/Lymphatic: Generalized atheromatous calcification. The major vessels are enhancing. No mass or adenopathy. Reproductive:No pathologic findings. Other: No ascites or pneumoperitoneum. Musculoskeletal: No acute abnormalities. Remote L3 superior endplate fracture. 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 Severe biforaminal impingement at L5-S1. IMPRESSION: 1. No acute intra-abdominal finding or explanation for symptoms. 2. Interstitial opacity in the lower lobes which has occurred since April 2021, question interval COVID. 3. Hepatic steatosis and extensive atherosclerosis. Electronically Signed   By: Monte Fantasia M.D.   On: 06/18/2020 10:15   DG Chest Portable 1 View  Result Date: 06/18/2020 CLINICAL DATA:  COVID.  Right abdominal pain. EXAM: PORTABLE CHEST 1 VIEW COMPARISON:  09/10/2018 FINDINGS: Scarring at the left lung base, stable. Heart is normal size. Right lung clear. No effusions. No acute bony abnormality. IMPRESSION: Left basilar scarring. No active disease. Electronically Signed   By: Rolm Baptise M.D.   On: 06/18/2020 05:55

## 2020-06-18 NOTE — Progress Notes (Signed)
Pharmacy COVID-19 Monoclonal Antibody Screening  Nathaniel Long was identified as being not hospitalized with symptoms from Covid-19 on admission but an incidental positive PCR has been documented.  The patient may qualify for the use of monoclonal antibodies (mAB) for COVID-19 viral infection to prevent worsening symptoms stemming from Covid-19 infection.  The patient was identified based on a positive COVID-19 PCR and not requiring the use of supplemental oxygen at this time.  This patient meets the FDA criteria for Emergency Use Authorization of casirivimab/imdevimab.  Has a (+) direct SARS-CoV-2 viral test result  Is NOT hospitalized due to COVID-19  Is within 10 days of symptom onset  Has at least one of the high risk factor(s) for progression to severe COVID-19 and/or hospitalization as defined in EUA.  Specific high risk criteria : Older age (>/= 79 yo), BMI > 25, Cardiovascular disease or hypertension and Chronic Lung Disease  Additionally: The patient has not had a positive COVID-19 PCR in the last 90 days.  The patient is fully vaccinated against COVID-19.  Since the patient is partially or fully vaccinated for COVID-19, is asymptomatic with a cycle time of < 32, and meets high risk criteria, the patient is eligible for mAB administration.   This eligibility and indication for treatment was discussed with the patient's physician: Dr. Oren Binet  Plan: Based on the above discussion, it was decided that the patient will receive one dose of mAB combination.Casirivimab/imdevimab has been ordered. Pharmacy will coordinate administration timing with patient's nurse. Recommended infusion monitoring parameters communicated to the nursing team.   Myking Sar, Rande Lawman 06/18/2020  9:08 AM

## 2020-06-18 NOTE — H&P (Signed)
History and Physical    Nathaniel Long:[id number] DOB: Sep 14, 1941 DOA: 06/17/2020  PCP: [name], MD   Patient coming from: Home   Chief Complaint: abdominal pain, constipation, black tarry stools.  HPI: Nathaniel Long is a 79 y.o. male with medical history significant for atherosclerosis, HTN, HLD, CKD 3, COPD, GERD is hard of hearing who presents for evaluation of abdominal pain with black tarry stools. He reports he has had symptoms for the past week but they have worsened in the last 2 days. He reports he has been driving 5 hours to go work on his boat for 2 to 3 hours and then drive back home for 5 hours and has been constipated over the past week. He has been using mineral oil and over-the-counter laxative to help with his constipation. In the last few days he has had black tarry stools. He denies any nausea or vomiting. He reports that the abdominal pain is in the epigastric region and does not radiate and is a dull pain with no aggravating or alleviating factors. He had a colonoscopy just over a year ago and was told it was good and not to come back for 10 years. Denies any bright red blood per rectum. He denies any vomiting of blood or coffee-ground emesis. States his appetite has been normal in the last week. He denies having any fever, cough, shortness of breath, rash, syncope, chest pain, palpitations.  He was fully vaccinated for Covid in the spring of this year. He denies tobacco, alcohol, illicit drug use.  ED Course: Emergency room patient is found to have mildly decreased hemoglobin at 12.6 and has Hemoccult positive stool. Does have mild pain on exam. He is hemodynamically stable. He is found to be Covid positive but is maintaining O2 sats in the mid 90s on room air with no complaints of any shortness of breath.  Review of Systems:  General: Denies weakness, fever, chills, weight loss, night sweats.  Denies dizziness.  Denies change in appetite HENT: Denies head trauma,  headache, denies change in hearing, tinnitus.  Denies nasal congestion or bleeding.  Denies sore throat, sores in mouth.  Denies difficulty swallowing Eyes: Denies blurry vision, pain in eye, drainage.  Denies discoloration of eyes. Neck: Denies pain.  Denies swelling.  Denies pain with movement. Cardiovascular: Denies chest pain, palpitations.  Denies edema.  Denies orthopnea Respiratory: Denies shortness of breath, cough.  Denies wheezing.  Denies sputum production Gastrointestinal: Reports abdominal pain. Denies swelling.  Denies nausea, vomiting, diarrhea. Reports constipation.  Reports melena.  Denies hematemesis. Musculoskeletal: Denies limitation of movement.  Denies deformity or swelling.  Denies pain.  Denies arthralgias or myalgias. Genitourinary: Denies pelvic pain.  Denies urinary frequency or hesitancy.  Denies dysuria.  Skin: Denies rash.  Denies petechiae, purpura, ecchymosis. Neurological: Denies headache.  Denies syncope.  Denies seizure activity.  Denies weakness or paresthesia.  Denies slurred speech, drooping face.  Denies visual change. Psychiatric: Denies depression, anxiety.  Denies suicidal thoughts or ideation.  Denies hallucinations.  Past Medical History:  Diagnosis Date  . Anxiety   . Carotid artery stenosis   . Chronic lower back pain   . CKD (chronic kidney disease)   . Complication of anesthesia    "can't pee when I come out of OR" (07/18/2017)  . Contact with chainsaw as cause of accidental injury 07/16/2017   sternal fracture with bilateral anterior fifth and sixth rib fractures /notes 07/16/2017  . COPD (chronic obstructive pulmonary disease) ([place])   .  Dyspnea   . GERD (gastroesophageal reflux disease)   . Hard of hearing   . High cholesterol   . Hypertension   . Peripheral vascular disease ([place])   . Thoracic ascending aortic aneurysm [facility])     Past Surgical History:  Procedure Laterality Date  . ABDOMINAL AORTOGRAM W/LOWER EXTREMITY N/A 05/01/2017     Procedure: Abdominal Aortogram w/Lower Extremity;  Surgeon: Adrian Prows, MD;  Location: [place] [facility];  Service: Cardiovascular;  Laterality: N/A;  . BACK SURGERY    . FINGER REPLANTATION Left    "ring finger"  . HEMORRHOID SURGERY    . POSTERIOR LUMBAR FUSION  1986  . RENAL ANGIOGRAPHY Bilateral 12/18/2017   Procedure: RENAL ANGIOGRAPHY;  Surgeon: Nigel Mormon, MD;  Location: [place] [facility];  Service: Cardiovascular;  Laterality: Bilateral;  . VIDEO ASSISTED THORACOSCOPY (VATS)/ LOBECTOMY Left 09/19/2017   Procedure: LEFT VIDEO ASSISTED THORACOSCOPY (VATS)/ LEFT UPPER LOBECTOMY, LYMPH NODE DISECTION;  Surgeon: Melrose Nakayama, MD;  Location: [place];  Service: Thoracic;  Laterality: Left;  Marland Kitchen VIDEO BRONCHOSCOPY WITH ENDOBRONCHIAL NAVIGATION N/A 09/05/2017   Procedure: VIDEO BRONCHOSCOPY WITH ENDOBRONCHIAL NAVIGATION;  Surgeon: Collene Gobble, MD;  Location: [facility];  Service: Thoracic;  Laterality: N/A;  . VIDEO BRONCHOSCOPY WITH ENDOBRONCHIAL ULTRASOUND N/A 09/05/2017   Procedure: VIDEO BRONCHOSCOPY WITH ENDOBRONCHIAL ULTRASOUND;  Surgeon: Collene Gobble, MD;  Location: [facility];  Service: Thoracic;  Laterality: N/A;    Social History  reports that he quit smoking about 3 years ago. His smoking use included cigarettes. He has a 33.00 pack-year smoking history. He has never used smokeless tobacco. He reports current alcohol use. He reports that he does not use drugs.  Allergies  Allergen Reactions  . Gabapentin Other (See Comments)    dizziness  . Statins Other (See Comments)    Muscle weakness    Family History  Problem Relation Age of Onset  . Colon polyps Brother   . Dementia Sister      Prior to Admission medications   Medication Sig Start Date End Date Taking? Authorizing Provider  albuterol (PROVENTIL) (2.5 MG/3ML) 0.083% nebulizer solution USE 1 VIAL IN NEBULIZER EVERY 6 HOURS - for wheezing or shortness of breath 11/13/19   Mannam, Praveen, MD  ALPRAZolam  (XANAX) 0.5 MG tablet TAKE 1 TABLET BY MOUTH AT BEDTIME. 05/18/20   Adrian Prows, MD  amLODipine (NORVASC) 10 MG tablet TAKE 1/2 TABLET BY MOUTH DAILY 05/07/20   Adrian Prows, MD  calcium carbonate (TUMS - DOSED IN MG ELEMENTAL CALCIUM) 500 MG chewable tablet Chew 2 tablets by mouth daily as needed for indigestion or heartburn.     [provider]  cetirizine (ZYRTEC) 10 MG tablet TAKE 1 TABLET BY MOUTH AT BEDTIME Patient taking differently: Take 10 mg by mouth daily as needed for allergies 06/16/11   Lyndal Pulley, DO  ezetimibe (ZETIA) 10 MG tablet Take 5 mg by mouth daily. 08/19/18   [provider]  fish oil-omega-3 fatty acids 1000 MG capsule Take 1 g by mouth every evening.     [provider]  LIVALO 4 MG TABS TAKE 1/2 TABLET(2 MG) BY MOUTH DAILY 12/22/19   Miquel Dunn, NP  metoprolol succinate (TOPROL XL) 25 MG 24 hr tablet Take 1 tablet (25 mg total) by mouth daily. 02/04/20   Adrian Prows, MD  Multiple Vitamins-Minerals (CENTRUM PO) Take 1 tablet by mouth every evening.     [provider]  pantoprazole (PROTONIX) 40 MG  tablet TAKE 1 TABLET BY MOUTH ONCE DAILY 30 MINUTES BEFORE A  MEAL 12/02/19   Adrian Prows, MD  sodium chloride (OCEAN) 0.65 % SOLN nasal spray Place 1 spray into both nostrils as needed for congestion.    [provider]  Tiotropium Bromide-Olodaterol (STIOLTO RESPIMAT) 2.5-2.5 MCG/ACT AERS Inhale 2 puffs into the lungs daily. 12/11/19   Marshell Garfinkel, MD    Physical Exam: Vitals:   06/17/20 2257 06/18/20 0102 06/18/20 0406 06/18/20 0445  BP: 108/90 134/82 132/70 (!) 148/76  Pulse: 65 70 61 74  Resp: 16 16 18    Temp: 99.2 F (37.3 C)  98.3 F (36.8 C)   TempSrc: Oral  Oral   SpO2: 95% 99% 92% 98%  Weight: 91.6 kg     Height: 5\' 11"  (1.803 m)       Constitutional: NAD, calm, comfortable Vitals:   06/17/20 2257 06/18/20 0102 06/18/20 0406 06/18/20 0445  BP: 108/90 134/82 132/70 (!) 148/76  Pulse: 65 70 61 74  Resp:  16 16 18    Temp: 99.2 F (37.3 C)  98.3 F (36.8 C)   TempSrc: Oral  Oral   SpO2: 95% 99% 92% 98%  Weight: 91.6 kg     Height: 5\' 11"  (1.803 m)      General: WDWN, Alert and oriented x3.  Eyes: EOMI, PERRL, lids and conjunctivae normal.  Sclera nonicteric HENT:  Kenn[place]/AT, external ears normal. Hearing aids in place. Nares patent without epistasis.  Mucous membranes are moist. Posterior pharynx clear of any exudate or lesions. Neck: Soft, normal range of motion, supple, no masses, no thyromegaly.  Trachea midline Respiratory: clear to auscultation bilaterally, no wheezing, no crackles. Normal respiratory effort. No accessory muscle use.  Cardiovascular: Regular rate and rhythm, no murmurs / rubs / gallops. No extremity edema. 2+ pedal pulses. No carotid bruits.  Abdomen: Soft, Diffuse upper abdominal tenderness to palpation, nondistended, no rebound or guarding.  No masses palpated. No hepatosplenomegaly. Bowel sounds hyperactive Musculoskeletal: FROM. no clubbing / cyanosis. No joint deformity upper and lower extremities. Normal muscle tone.  Skin: Warm, dry, intact no rashes, lesions, ulcers. No induration Neurologic: CN 2-12 grossly intact.  Normal speech.  Sensation intact. Strength 5/5 in all extremities.   Psychiatric: Normal judgment and insight.  Normal mood.    Labs on Admission: I have personally reviewed following labs and imaging studies  CBC: Recent Labs  Lab 06/17/20 1852  WBC 15.0*  HGB 12.6*  HCT 38.4*  MCV 96.2  PLT 793    Basic Metabolic Panel: Recent Labs  Lab 06/17/20 1852  NA 135  K 4.3  CL 102  CO2 21*  GLUCOSE 140*  BUN 33*  CREATININE 1.54*  CALCIUM 8.7*    GFR: Estimated Creatinine Clearance: 45.7 mL/min (A) (by C-G formula based on SCr of 1.54 mg/dL (H)).  Liver Function Tests: Recent Labs  Lab 06/17/20 1852  AST 57*  ALT 44  ALKPHOS 65  BILITOT 0.7  PROT 6.9  ALBUMIN 3.7    Urine analysis:    Component Value Date/Time    COLORURINE YELLOW 09/28/2017 2148   APPEARANCEUR CLEAR 09/28/2017 2148   LABSPEC 1.020 09/28/2017 2148   PHURINE 7.0 09/28/2017 2148   GLUCOSEU NEGATIVE 09/28/2017 2148   HGBUR NEGATIVE 09/28/2017 2148   Gerald NEGATIVE 09/28/2017 2148   [place] NEGATIVE 09/28/2017 2148   PROTEINUR 100 (A) 09/28/2017 2148   NITRITE NEGATIVE 09/28/2017 2148   LEUKOCYTESUR NEGATIVE 09/28/2017 2148    Radiological Exams on Admission:  DG Abdomen 1 View  Result Date: 06/18/2020 CLINICAL DATA:  COVID.  Abdominal pain EXAM: ABDOMEN - 1 VIEW COMPARISON:  None. FINDINGS: Gas throughout nondistended large and small bowel. No evidence of bowel obstruction or organomegaly. No suspicious calcification or acute bony abnormality. Lucency noted a adjacent to the liver and centrally in the upper chest. While no free air is seen under the diaphragms on today's chest x-ray, cannot exclude pneumoperitoneum. IMPRESSION: Lucency around the liver edge and in the upper abdomen at the midline. Cannot exclude free air. Recommend upright or decubitus abdominal view. Electronically Signed   By: Rolm Baptise M.D.   On: 06/18/2020 05:57   DG Chest Portable 1 View  Result Date: 06/18/2020 CLINICAL DATA:  COVID.  Right abdominal pain. EXAM: PORTABLE CHEST 1 VIEW COMPARISON:  09/10/2018 FINDINGS: Scarring at the left lung base, stable. Heart is normal size. Right lung clear. No effusions. No acute bony abnormality. IMPRESSION: Left basilar scarring. No active disease. Electronically Signed   By: Rolm Baptise M.D.   On: 06/18/2020 05:55    Assessment/Plan Principal Problem:   GI bleed Patient Sweetman presents with abdominal pain with melena.  Stool is Hemoccult positive on testing.  Initial hemoglobin was greater than 12. Patient admitted to medical surgical floor for further monitoring.  We will repeat CBC this morning Patient been placed on Protonix infusion and was given a Protonix bolus.  Gastroenterology will be consulted for  further evaluation. Patient is found to be Covid positive but has no respiratory symptoms and is attaining O2 sats in the mid 90s on room air.  Covid has been documented in literature to etiology of GI bleeding.   Active Problems:   Renovascular hypertension Home antihypertensives will be verified and reconciled by pharmacy and resumed.    COVID-19 virus infection Patient been fully vaccinated for Covid in March but is Covid positive at this time.  Patient is not requiring oxygen and is maintaining oxygen saturations in the mid 90s.  Patient will be kept on Covid precautions    Abdominal pain Patient with generalized abdominal pain that he reports is due to leaning over the side rail of his boat and some constipation.  Pain could be secondary to peptic ulcer disease or gastritis.  Will await further evaluation by gastroenterology    CKD (chronic kidney disease) stage 3, GFR 30-59 ml/min Stable chronic kidney disease.    COPD (chronic obstructive pulmonary disease) (HCC) Combivent Respimat provided. Incentive spirometer every 2 hours while awake    Hard of hearing Uses hearing aids    DVT prophylaxis:      SCDs for DVT prophylaxis.  Anticoagulation not used in light of GI bleeding Code Status:   Full code Family Communication:  Diagnosis and plan discussed with patient.  Questions answered.  Patient verbalized understanding agrees with plan.  Further recommendation follow as clinically indicated Disposition Plan:   Patient is from:  Home  Anticipated DC to:  Home  Anticipated DC date:  Anticipate greater than 2 midnight stay in the hospital to treat acute medical condition  Anticipated DC barriers: No barriers to discharge identified at this time  Consults called:  Gastroenterology, Dr. Tarri Glenn.  Order for consult and secure chat message sent Admission status:  Inpatient  Severity of Illness: The appropriate patient status for this patient is INPATIENT. Inpatient status is judged  to be reasonable and necessary in order to provide the required intensity of service to ensure the patient's safety. The patient's presenting symptoms,  physical exam findings, and initial radiographic and laboratory data in the context of their chronic comorbidities is felt to place them at high risk for further clinical deterioration. Furthermore, it is not anticipated that the patient will be medically stable for discharge from the hospital within 2 midnights of admission. The following factors support the patient status of inpatient.  * I certify that at the point of admission it is my clinical judgment that the patient will require inpatient hospital care spanning beyond 2 midnights from the point of admission due to high intensity of service, high risk for further deterioration and high frequency of surveillance required.Yevonne Aline Inis Borneman MD [facility]  How to contact the [facility] Attending or Consulting provider [place] or covering provider during after hours [place], for this patient?   1. Check the care team in [facility] and look for a) attending/consulting [facility] provider listed and b) the [facility] team listed 2. Log into [email or web address] and use [place]'s universal password to access. If you do not have the password, please contact the hospital operator. 3. Locate the [facility] provider you are looking for under [facility] and page to a number that you can be directly reached. 4. If you still have difficulty reaching the provider, please page the [facility] (Director on Call) for the Hospitalists listed on amion for assistance.  06/18/2020, 6:08 AM

## 2020-06-18 NOTE — Progress Notes (Signed)
Received to room 5W32 from ER via stretcher. Assisted to bed and positioned for comfort. Oriented to room, bed and unit. Extremely HOH with hearing aids bilaterally. In no acute distress.

## 2020-06-18 NOTE — Consult Note (Addendum)
[facility] Consult: 8:16 AM 06/18/2020  LOS: 0 days    Referring Provider: Dr Nena Alexander  Primary Care Physician:  [name], MD Primary Gastroenterologist:  Dr. Fuller Plan   Reason for Consultation:  Tarry stool.  Abd pain.     HPI: Nathaniel Long is a 79 y.o. male.  PMH below.  Takes no anti-clotting meds though took Plavix in past.   Daily Protonix but ran out and none for >2 weeks.  HOH despite bil hearing aids.   Hyperplastic polyps on colonoscopy 2008.      12/2016 colonoscopy.  Screening study.  Sessile, 6 mm polyp snared from appendiceal orifice (path: SSA).  Small, nonbleeding internal hemorrhoids.  A few diverticuli in the sigmoid.  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  Felt sudden pain in RLQ after working on boat engine and laid on same side to complete work in next few days.  No epigastric pain.  No N/V. No NSAIDs, took ibuprofen for about 3 days 1 month ago.  Due to driving x 5 hours back and to his boat became constipated. Took mineral oil and MOM for constipation and then passed black/greenish liquid stools starting Tuesday through Friday w some brown stools as well. Tested Covid 19 test positive on 9/15 though fully vaxxed [employer] completed mid 12/2019).  + cough.  Started on steroid dose back and Doxycycline.   BPs 130s-140s/70's-90s.  RA sats of 92% on RA currently  FOBT +.   Hgb 12.6, 13.8 in 01/2020.  MCV 96.   WBC 15.   Stable CKD stage 3b.  AST 57, O/w normal LFTs.  Glucose 140 but no hx DM. KUB: lucency at liver edge in upper midline abdomen, not able to r/o RA.   Rec upright/decub films.       Past Medical History:  Diagnosis Date  . Anxiety   . Carotid artery stenosis   . Chronic lower back pain   . CKD (chronic kidney disease)   .  Complication of anesthesia    "can't pee when I come out of OR" (07/18/2017)  . Contact with chainsaw as cause of accidental injury 07/16/2017   sternal fracture with bilateral anterior fifth and sixth rib fractures /notes 07/16/2017  . COPD (chronic obstructive pulmonary disease) ([place])   . Dyspnea   . GERD (gastroesophageal reflux disease)   . Hard of hearing   . High cholesterol   . Hypertension   . Peripheral vascular disease ([place])   . Thoracic ascending aortic aneurysm [facility])     Past Surgical History:  Procedure Laterality Date  . ABDOMINAL AORTOGRAM W/LOWER EXTREMITY N/A 05/01/2017   Procedure: Abdominal Aortogram w/Lower Extremity;  Surgeon: Adrian Prows, MD;  Location: [facility];  Service: Cardiovascular;  Laterality: N/A;  . BACK SURGERY    . FINGER REPLANTATION Left    "ring finger"  . HEMORRHOID SURGERY    . POSTERIOR LUMBAR FUSION  1986  . RENAL ANGIOGRAPHY Bilateral 12/18/2017   Procedure: RENAL ANGIOGRAPHY;  Surgeon: Vernell Leep  J, MD;  Location: [place] [facility];  Service: Cardiovascular;  Laterality: Bilateral;  . VIDEO ASSISTED THORACOSCOPY (VATS)/ LOBECTOMY Left 09/19/2017   Procedure: LEFT VIDEO ASSISTED THORACOSCOPY (VATS)/ LEFT UPPER LOBECTOMY, LYMPH NODE DISECTION;  Surgeon: Melrose Nakayama, MD;  Location: [facility];  Service: Thoracic;  Laterality: Left;  Marland Kitchen VIDEO BRONCHOSCOPY WITH ENDOBRONCHIAL NAVIGATION N/A 09/05/2017   Procedure: VIDEO BRONCHOSCOPY WITH ENDOBRONCHIAL NAVIGATION;  Surgeon: Collene Gobble, MD;  Location: [facility];  Service: Thoracic;  Laterality: N/A;  . VIDEO BRONCHOSCOPY WITH ENDOBRONCHIAL ULTRASOUND N/A 09/05/2017   Procedure: VIDEO BRONCHOSCOPY WITH ENDOBRONCHIAL ULTRASOUND;  Surgeon: Collene Gobble, MD;  Location: [facility];  Service: Thoracic;  Laterality: N/A;    Prior to Admission medications   Medication Sig Start Date End Date Taking? Authorizing Provider  albuterol (PROVENTIL) (2.5 MG/3ML) 0.083% nebulizer solution USE 1  VIAL IN NEBULIZER EVERY 6 HOURS - for wheezing or shortness of breath 11/13/19   Mannam, Praveen, MD  ALPRAZolam (XANAX) 0.5 MG tablet TAKE 1 TABLET BY MOUTH AT BEDTIME. 05/18/20   Adrian Prows, MD  amLODipine (NORVASC) 10 MG tablet TAKE 1/2 TABLET BY MOUTH DAILY 05/07/20   Adrian Prows, MD  calcium carbonate (TUMS - DOSED IN MG ELEMENTAL CALCIUM) 500 MG chewable tablet Chew 2 tablets by mouth daily as needed for indigestion or heartburn.     [provider]  cetirizine (ZYRTEC) 10 MG tablet TAKE 1 TABLET BY MOUTH AT BEDTIME Patient taking differently: Take 10 mg by mouth daily as needed for allergies 06/16/11   Lyndal Pulley, DO  ezetimibe (ZETIA) 10 MG tablet Take 5 mg by mouth daily. 08/19/18   [provider]  fish oil-omega-3 fatty acids 1000 MG capsule Take 1 g by mouth every evening.     [provider]  LIVALO 4 MG TABS TAKE 1/2 TABLET(2 MG) BY MOUTH DAILY 12/22/19   Miquel Dunn, NP  metoprolol succinate (TOPROL XL) 25 MG 24 hr tablet Take 1 tablet (25 mg total) by mouth daily. 02/04/20   Adrian Prows, MD  Multiple Vitamins-Minerals (CENTRUM PO) Take 1 tablet by mouth every evening.     [provider]  pantoprazole (PROTONIX) 40 MG tablet TAKE 1 TABLET BY MOUTH ONCE DAILY 30 MINUTES BEFORE A  MEAL 12/02/19   Adrian Prows, MD  sodium chloride (OCEAN) 0.65 % SOLN nasal spray Place 1 spray into both nostrils as needed for congestion.    [provider]  Tiotropium Bromide-Olodaterol (STIOLTO RESPIMAT) 2.5-2.5 MCG/ACT AERS Inhale 2 puffs into the lungs daily. 12/11/19   Mannam, Hart Robinsons, MD    Scheduled Meds: . [START ON 06/21/2020] pantoprazole  40 mg Intravenous Q12H   Infusions: . pantoprozole (PROTONIX) infusion     PRN Meds:    Allergies as of 06/17/2020 - Review Complete 06/17/2020  Allergen Reaction Noted  . Gabapentin Other (See Comments) 04/26/2017  . Statins Other (See Comments) 01/04/2017    Family History  Problem Relation Age of  Onset  . Colon polyps Brother   . Dementia Sister     Social History   Socioeconomic History  . Marital status: Married    Spouse name: Not on file  . Number of children: Not on file  . Years of education: Not on file  . Highest education level: Not on file  Occupational History  . Not on file  Tobacco Use  . Smoking status: Former Smoker    Packs/day: 1.00    Years: 33.00  Pack years: 33.00    Types: Cigarettes    Quit date: 04/17/2017    Years since quitting: 3.1  . Smokeless tobacco: Never Used  . Tobacco comment: May, 2018  Vaping Use  . Vaping Use: Never used  Substance and Sexual Activity  . Alcohol use: Yes    Comment: last 95  . Drug use: No  . Sexual activity: Not Currently  Other Topics Concern  . Not on file  Social History Narrative  . Not on file   Social Determinants of Health   Financial Resource Strain:   . Difficulty of Paying Living Expenses: Not on file  Food Insecurity:   . Worried About Charity fundraiser in the Last Year: Not on file  . Ran Out of Food in the Last Year: Not on file  Transportation Needs:   . Lack of Transportation (Medical): Not on file  . Lack of Transportation (Non-Medical): Not on file  Physical Activity:   . Days of Exercise per Week: Not on file  . Minutes of Exercise per Session: Not on file  Stress:   . Feeling of Stress : Not on file  Social Connections:   . Frequency of Communication with Friends and Family: Not on file  . Frequency of Social Gatherings with Friends and Family: Not on file  . Attends Religious Services: Not on file  . Active Member of Clubs or Organizations: Not on file  . Attends Archivist Meetings: Not on file  . Marital Status: Not on file  Intimate Partner Violence:   . Fear of Current or Ex-Partner: Not on file  . Emotionally Abused: Not on file  . Physically Abused: Not on file  . Sexually Abused: Not on file    REVIEW OF SYSTEMS: Constitutional:  See HPI ENT:  No  nose bleeds.  About a month ago he bit down on a block present in food he was eating, this caused damage to caps in his upper left jaw.  The caps were removed wthin the last week and a mold was taken of the teeth in order to fabricate new caps.  Pulm: Nonproductive cough.  No pleuritic pain. CV:  No palpitations, no LE edema.  No angina. GU:  No hematuria, no frequency GI: See HPI. Heme: No unusual bleeding or bruising. Transfusions: None Neuro:  No headaches, no peripheral tingling or numbness.  No syncope Derm:  No itching, no rash or sores.  Endocrine: Recent night sweats.  No polyuria or dysuria Immunization: See HPI.   PHYSICAL EXAM: Vital signs in last 24 hours: Vitals:   06/18/20 0500 06/18/20 0515  BP: 138/74 120/76  Pulse: 73 (!) 58  Resp:    Temp:    SpO2: 93% 92%   Wt Readings from Last 3 Encounters:  06/17/20 91.6 kg  01/13/20 91.9 kg  12/11/19 92.1 kg    General: Patient looks well.  No distress.   Head: No facial asymmetry or swelling.  No signs of head trauma. Eyes: No conjunctival pallor.  No scleral icterus. Ears:  Very hard of hearing despite his bilateral hearing aids.  Nose: No congestion, no discharge. Mouth: Oropharynx moist, pink, clear.  Tongue midline. Neck: No JVD, no masses, no thyromegaly Lungs: Clear bilaterally.  No dyspnea.  No cough. Heart: RRR.  No MRG.  S1, S2 audible. Abdomen: Soft without tenderness despite detailed palpation of right lower quadrant.  No masses.  No HSM, no bruits, no hernias.  Bowel sounds active.Marland Kitchen  Rectal: Deferred.  Stool submitted to lab yesterday was FOBT positive. Musc/Skeltl: No joint redness, swelling or gross deformity. Extremities: No CCE. Neurologic: Hard of hearing.  Alert and oriented x3.  Good historian.  Moves all 4 limbs without gross weakness, strength not tested.  No tremors. Skin: No sores or rashes, no telangiectasia on portion of examined skin. Nodes: No cervical adenopathy Psych: Calm, pleasant,  cooperative.  Intake/Output from previous day: No intake/output data recorded. Intake/Output this shift: No intake/output data recorded.  LAB RESULTS: Recent Labs    06/17/20 1852  WBC 15.0*  HGB 12.6*  HCT 38.4*  PLT 338   BMET Lab Results  Component Value Date   NA 135 06/17/2020   NA 138 01/12/2020   NA 140 05/05/2019   K 4.3 06/17/2020   K 4.1 01/12/2020   K 4.2 05/05/2019   CL 102 06/17/2020   CL 109 01/12/2020   CL 108 05/05/2019   CO2 21 (L) 06/17/2020   CO2 20 (L) 01/12/2020   CO2 24 05/05/2019   GLUCOSE 140 (H) 06/17/2020   GLUCOSE 97 01/12/2020   GLUCOSE 109 (H) 05/05/2019   BUN 33 (H) 06/17/2020   BUN 23 01/12/2020   BUN 21 05/05/2019   CREATININE 1.54 (H) 06/17/2020   CREATININE 1.74 (H) 01/12/2020   CREATININE 1.64 (H) 05/05/2019   CALCIUM 8.7 (L) 06/17/2020   CALCIUM 8.7 (L) 01/12/2020   CALCIUM 8.8 (L) 05/05/2019   LFT Recent Labs    06/17/20 1852  PROT 6.9  ALBUMIN 3.7  AST 57*  ALT 44  ALKPHOS 65  BILITOT 0.7   PT/INR Lab Results  Component Value Date   INR 0.99 09/17/2017   INR 1.1 (H) 08/01/2017   Hepatitis Panel No results for input(s): HEPBSAG, HCVAB, HEPAIGM, HEPBIGM in the last 72 hours. C-Diff No components found for: CDIFF Lipase  No results found for: LIPASE  Drugs of Abuse  No results found for: LABOPIA, COCAINSCRNUR, LABBENZ, AMPHETMU, THCU, LABBARB   RADIOLOGY STUDIES: DG Abdomen 1 View  Result Date: 06/18/2020 CLINICAL DATA:  COVID.  Abdominal pain EXAM: ABDOMEN - 1 VIEW COMPARISON:  None. FINDINGS: Gas throughout nondistended large and small bowel. No evidence of bowel obstruction or organomegaly. No suspicious calcification or acute bony abnormality. Lucency noted a adjacent to the liver and centrally in the upper chest. While no free air is seen under the diaphragms on today's chest x-ray, cannot exclude pneumoperitoneum. IMPRESSION: Lucency around the liver edge and in the upper abdomen at the midline. Cannot  exclude free air. Recommend upright or decubitus abdominal view. Electronically Signed   By: Rolm Baptise M.D.   On: 06/18/2020 05:57   DG Chest Portable 1 View  Result Date: 06/18/2020 CLINICAL DATA:  COVID.  Right abdominal pain. EXAM: PORTABLE CHEST 1 VIEW COMPARISON:  09/10/2018 FINDINGS: Scarring at the left lung base, stable. Heart is normal size. Right lung clear. No effusions. No acute bony abnormality. IMPRESSION: Left basilar scarring. No active disease. Electronically Signed   By: Rolm Baptise M.D.   On: 06/18/2020 05:55     IMPRESSION:   *  FOBT + tarry stools.  RLQ abd pain.  No anemia. Suspect the pain is musculoskeletal and not likely linked with tarry stool.  Hemodynamically stable.  Slight elevation AST but O/w normal LFTs Lucency of liver edge, not able to r/o FA.   CTAP pndg.   Protonix gtt in place.    *   Covid 19 test +. [employer]  completed mid 12/2019.  Mild flulike symptoms.  Started on steroid Dosepak and doxycycline on 9/15 and completed 2 days of this, now discontinued.   Now on scheduled casirivimab-imdevimab and s/p 1 dose of Solumedrol.    *   CKD stage 3b.  *   S/p Lobectomy for NSCC dx 09/2017.      PLAN:     *    Await completion of ordered CTAP. Case d/w Dr Delane Ginger, no immediate plans for EGD but orders in place in depot.  Stay on PPI gtt, allow FL diet.     Azucena Freed  06/18/2020, 8:16 AM Phone [phone]   Attending physician's note   I have taken a history, examined the patient and reviewed the chart. I agree with the Advanced Practitioner's note, impression and recommendations.  77 yr M with CKD stage 3, admitted with fatigue and malaise  Covid +  FOBT +, ?melena  C/o RLQ pain, uses Ibuprofen intermittently , last time he took it was 2-3 weeks ago  He is extremely hard of hearing, encountered difficulty communicating with him  Short of breath with minimal effort, sitting up in bed or change in position. O2 sat 90% on room  air  Leucocytosis  Hgb trended down from baseline  Check LDH/Haptoglobin to exclude hemolysis/ additional etiology for anemia in the setting of Covid infection  Continue to monitor Hgb twice daily  Continue PPI  Soft diet as tolerated  Will hold off endoscopic evaluation unless is needed for therapeutic intervention if he develops active GI hemorrhage.  GI will continue to follow along    The patient was provided an opportunity to ask questions and all were answered. The patient agreed with the plan and demonstrated an understanding of the instructions.  Damaris Hippo , MD [phone]

## 2020-06-18 NOTE — Care Management CC44 (Signed)
Condition Code 44 Documentation Completed  Patient Details  Name: ADEMIDE SCHABERG MRN: [id number] Date of Birth: Aug 20, 1941   Condition Code 44 given:  Yes Patient signature on Condition Code 44 notice:  Yes Documentation of 2 MD's agreement:    Code 44 added to claim:  Yes    Laurena Slimmer, RN 06/18/2020, 5:36 PM

## 2020-06-19 DIAGNOSIS — J42 Unspecified chronic bronchitis: Secondary | ICD-10-CM | POA: Diagnosis not present

## 2020-06-19 DIAGNOSIS — K921 Melena: Secondary | ICD-10-CM | POA: Diagnosis not present

## 2020-06-19 DIAGNOSIS — U071 COVID-19: Secondary | ICD-10-CM | POA: Diagnosis not present

## 2020-06-19 DIAGNOSIS — N183 Chronic kidney disease, stage 3 unspecified: Secondary | ICD-10-CM | POA: Diagnosis not present

## 2020-06-19 LAB — CBC
HCT: 34 % — ABNORMAL LOW (ref 39.0–52.0)
Hemoglobin: 11.1 g/dL — ABNORMAL LOW (ref 13.0–17.0)
MCH: 31.4 pg (ref 26.0–34.0)
MCHC: 32.6 g/dL (ref 30.0–36.0)
MCV: 96.3 fL (ref 80.0–100.0)
Platelets: 329 10*3/uL (ref 150–400)
RBC: 3.53 MIL/uL — ABNORMAL LOW (ref 4.22–5.81)
RDW: 12.9 % (ref 11.5–15.5)
WBC: 11.3 10*3/uL — ABNORMAL HIGH (ref 4.0–10.5)
nRBC: 0 % (ref 0.0–0.2)

## 2020-06-19 LAB — COMPREHENSIVE METABOLIC PANEL
ALT: 32 U/L (ref 0–44)
AST: 33 U/L (ref 15–41)
Albumin: 3 g/dL — ABNORMAL LOW (ref 3.5–5.0)
Alkaline Phosphatase: 54 U/L (ref 38–126)
Anion gap: 9 (ref 5–15)
BUN: 26 mg/dL — ABNORMAL HIGH (ref 8–23)
CO2: 21 mmol/L — ABNORMAL LOW (ref 22–32)
Calcium: 8.5 mg/dL — ABNORMAL LOW (ref 8.9–10.3)
Chloride: 109 mmol/L (ref 98–111)
Creatinine, Ser: 1.53 mg/dL — ABNORMAL HIGH (ref 0.61–1.24)
GFR calc Af Amer: 50 mL/min — ABNORMAL LOW (ref >60)
GFR calc non Af Amer: 43 mL/min — ABNORMAL LOW (ref >60)
Glucose, Bld: 104 mg/dL — ABNORMAL HIGH (ref 70–99)
Potassium: 4 mmol/L (ref 3.5–5.1)
Sodium: 139 mmol/L (ref 135–145)
Total Bilirubin: 1.2 mg/dL (ref 0.3–1.2)
Total Protein: 6.4 g/dL — ABNORMAL LOW (ref 6.5–8.1)

## 2020-06-19 MED ORDER — POLYETHYLENE GLYCOL 3350 17 G PO PACK: 17.0000 g | PACK | Freq: Every day | ORAL | 0 refills | Status: DC

## 2020-06-19 MED ORDER — PANTOPRAZOLE SODIUM 40 MG PO TBEC
40.0000 mg | DELAYED_RELEASE_TABLET | Freq: Two times a day (BID) | ORAL | 0 refills | Status: DC
Start: 2020-06-19 — End: 2020-07-28

## 2020-06-19 NOTE — Progress Notes (Signed)
Patient requesting discharge-his constipation is much better with MiraLAX/senna-he has had 1 or 2 stools that black appearing.  Hemoglobin has dropped only slightly to 11.1.  Spoke with GI MD-Dr. Rejeana Brock endoscopic evaluation was required while inpatient-since he does not appear to be having active GI bleed-she suggest that it would not be possible to do EGD under general anesthesia over the weekend-and suggests that we discharge him home-her office will call him Monday to arrange for a outpatient EGD.  In terms of his COVID-19 infection-he currently is asymptomatic-on room air.  He is s/p monoclonal antibody infusion yesterday.  Subsequently spoke to his spouse over the phone (patient is extremely hard of hearing)-explained above-she is agreeable to watch him closely-and will await for GI office to contact her on Monday.  She is aware that if in the unlikely event that patient develops shortness of breath, pulse ox starts going down to the low 90s/high 80s-and if he starts developing significant worsening of his melena-she needs to seek immediate medical attention.

## 2020-06-19 NOTE — Progress Notes (Signed)
Discussed case with Dr. Sloan Leiter   He is planning to discharge patient home today, patient received monoclonal antibody infusion yesterday, currently asymptomatic with no oxygen requirement.  He is having dark stool.  Hemoglobin stable around 11.  No hemodynamic instability  Given Covid positive status, to do procedure per anesthesia protocol he will need general anesthesia and it is limited for only emergent cases.  We will plan to follow-up as outpatient and schedule EGD for further evaluation of melena at next available appointment  Patient is extremely hard of hearing, will need to communicate with his wife to coordinate the schedule.  Damaris Hippo , MD [phone]

## 2020-06-19 NOTE — Discharge Instructions (Signed)
Person Under Monitoring Name: Nathaniel Long  Location: [place] [id number]   Infection Prevention Recommendations for Individuals Confirmed to have, or Being Evaluated for, 2019 Novel Coronavirus (COVID-19) Infection Who Receive Care at Home  Individuals who are confirmed to have, or are being evaluated for, COVID-19 should follow the prevention steps below until a healthcare provider or local or state health department says they can return to normal activities.  Stay home except to get medical care You should restrict activities outside your home, except for getting medical care. Do not go to work, school, or public areas, and do not use public transportation or taxis.  Call ahead before visiting your doctor Before your medical appointment, call the healthcare provider and tell them that you have, or are being evaluated for, COVID-19 infection. This will help the healthcare provider's office take steps to keep other people from getting infected. Ask your healthcare provider to call the local or state health department.  Monitor your symptoms Seek prompt medical attention if your illness is worsening (e.g., difficulty breathing). Before going to your medical appointment, call the healthcare provider and tell them that you have, or are being evaluated for, COVID-19 infection. Ask your healthcare provider to call the local or state health department.  Wear a facemask You should wear a facemask that covers your nose and mouth when you are in the same room with other people and when you visit a healthcare provider. People who live with or visit you should also wear a facemask while they are in the same room with you.  Separate yourself from other people in your home As much as possible, you should stay in a different room from other people in your home. Also, you should use a separate bathroom, if available.  Avoid sharing household items You should not  share dishes, drinking glasses, cups, eating utensils, towels, bedding, or other items with other people in your home. After using these items, you should wash them thoroughly with soap and water.  Cover your coughs and sneezes Cover your mouth and nose with a tissue when you cough or sneeze, or you can cough or sneeze into your sleeve. Throw used tissues in a lined trash can, and immediately wash your hands with soap and water for at least 20 seconds or use an alcohol-based hand rub.  Wash your [facility] your hands often and thoroughly with soap and water for at least 20 seconds. You can use an alcohol-based hand sanitizer if soap and water are not available and if your hands are not visibly dirty. Avoid touching your eyes, nose, and mouth with unwashed hands.   Prevention Steps for Caregivers and Household Members of Individuals Confirmed to have, or Being Evaluated for, COVID-19 Infection Being Cared for in the Home  If you live with, or provide care at home for, a person confirmed to have, or being evaluated for, COVID-19 infection please follow these guidelines to prevent infection:  Follow healthcare provider's instructions Make sure that you understand and can help the patient follow any healthcare provider instructions for all care.  Provide for the patient's basic needs You should help the patient with basic needs in the home and provide support for getting groceries, prescriptions, and other personal needs.  Monitor the patient's symptoms If they are getting sicker, call his or her medical provider and tell them that the patient has, or is being evaluated for, COVID-19 infection. This will help the healthcare provider's office  take steps to keep other people from getting infected. Ask the healthcare provider to call the local or state health department.  Limit the number of people who have contact with the patient  If possible, have only one caregiver for the  patient.  Other household members should stay in another home or place of residence. If this is not possible, they should stay  in another room, or be separated from the patient as much as possible. Use a separate bathroom, if available.  Restrict visitors who do not have an essential need to be in the home.  Keep older adults, very young children, and other sick people away from the patient Keep older adults, very young children, and those who have compromised immune systems or chronic health conditions away from the patient. This includes people with chronic heart, lung, or kidney conditions, diabetes, and cancer.  Ensure good ventilation Make sure that shared spaces in the home have good air flow, such as from an air conditioner or an opened window, weather permitting.  Wash your hands often  Wash your hands often and thoroughly with soap and water for at least 20 seconds. You can use an alcohol based hand sanitizer if soap and water are not available and if your hands are not visibly dirty.  Avoid touching your eyes, nose, and mouth with unwashed hands.  Use disposable paper towels to dry your hands. If not available, use dedicated cloth towels and replace them when they become wet.  Wear a facemask and gloves  Wear a disposable facemask at all times in the room and gloves when you touch or have contact with the patient's blood, body fluids, and/or secretions or excretions, such as sweat, saliva, sputum, nasal mucus, vomit, urine, or feces.  Ensure the mask fits over your nose and mouth tightly, and do not touch it during use.  Throw out disposable facemasks and gloves after using them. Do not reuse.  Wash your hands immediately after removing your facemask and gloves.  If your personal clothing becomes contaminated, carefully remove clothing and launder. Wash your hands after handling contaminated clothing.  Place all used disposable facemasks, gloves, and other waste in a lined  container before disposing them with other household waste.  Remove gloves and wash your hands immediately after handling these items.  Do not share dishes, glasses, or other household items with the patient  Avoid sharing household items. You should not share dishes, drinking glasses, cups, eating utensils, towels, bedding, or other items with a patient who is confirmed to have, or being evaluated for, COVID-19 infection.  After the person uses these items, you should wash them thoroughly with soap and water.  Wash laundry thoroughly  Immediately remove and wash clothes or bedding that have blood, body fluids, and/or secretions or excretions, such as sweat, saliva, sputum, nasal mucus, vomit, urine, or feces, on them.  Wear gloves when handling laundry from the patient.  Read and follow directions on labels of laundry or clothing items and detergent. In general, wash and dry with the warmest temperatures recommended on the label.  Clean all areas the individual has used often  Clean all touchable surfaces, such as counters, tabletops, doorknobs, bathroom fixtures, toilets, phones, keyboards, tablets, and bedside tables, every day. Also, clean any surfaces that may have blood, body fluids, and/or secretions or excretions on them.  Wear gloves when cleaning surfaces the patient has come in contact with.  Use a diluted bleach solution (e.g., dilute bleach with 1 part  bleach and 10 parts water) or a household disinfectant with a label that says EPA-registered for coronaviruses. To make a bleach solution at home, add 1 tablespoon of bleach to 1 quart (4 cups) of water. For a larger supply, add  cup of bleach to 1 gallon (16 cups) of water.  Read labels of cleaning products and follow recommendations provided on product labels. Labels contain instructions for safe and effective use of the cleaning product including precautions you should take when applying the product, such as wearing gloves or  eye protection and making sure you have good ventilation during use of the product.  Remove gloves and wash hands immediately after cleaning.  Monitor yourself for signs and symptoms of illness Caregivers and household members are considered close contacts, should monitor their health, and will be asked to limit movement outside of the home to the extent possible. Follow the monitoring steps for close contacts listed on the symptom monitoring form.   ? If you have additional questions, contact your local health department or call the epidemiologist on call at [phone] (available 24/7). ? This guidance is subject to change. For the most up-to-date guidance from [facility], please refer to their website: [email or web address]

## 2020-06-19 NOTE — Progress Notes (Signed)
Nsg Discharge Note  Admit Date:  06/17/2020 Discharge date: 06/19/2020   Nathaniel Long to be D/C'd Home per MD order.  AVS completed and teaching provided to patient's wife.  Discharge Medication: Allergies as of 06/19/2020      Reactions   Gabapentin Other (See Comments)   dizziness   Statins Other (See Comments)   Muscle weakness      Medication List    STOP taking these medications   cetirizine 10 MG tablet Commonly known as: ZYRTEC   MEDROL (PAK) PO     TAKE these medications   acetaminophen 500 MG tablet Commonly known as: TYLENOL Take 1,000 mg by mouth every 6 (six) hours as needed.   albuterol (2.5 MG/3ML) 0.083% nebulizer solution Commonly known as: PROVENTIL USE 1 VIAL IN NEBULIZER EVERY 6 HOURS - for wheezing or shortness of breath What changed:   how much to take  how to take this  when to take this  reasons to take this  additional instructions   ALPRAZolam 0.5 MG tablet Commonly known as: XANAX TAKE 1 TABLET BY MOUTH AT BEDTIME.   amLODipine 10 MG tablet Commonly known as: NORVASC TAKE 1/2 TABLET BY MOUTH DAILY What changed: how much to take   calcium carbonate 500 MG chewable tablet Commonly known as: TUMS - dosed in mg elemental calcium Chew 1 tablet by mouth daily as needed for indigestion or heartburn.   CENTRUM PO Take 1 tablet by mouth every evening.   ezetimibe 10 MG tablet Commonly known as: ZETIA Take 5 mg by mouth daily.   fish oil-omega-3 fatty acids 1000 MG capsule Take 1 g by mouth every evening.   Livalo 4 MG Tabs Generic drug: Pitavastatin Calcium TAKE 1/2 TABLET(2 MG) BY MOUTH DAILY What changed: See the new instructions.   metoprolol succinate 25 MG 24 hr tablet Commonly known as: Toprol XL Take 1 tablet (25 mg total) by mouth daily.   pantoprazole 40 MG tablet Commonly known as: PROTONIX Take 1 tablet (40 mg total) by mouth 2 (two) times daily. What changed: See the new instructions.   polyethylene glycol  17 g packet Commonly known as: MIRALAX / GLYCOLAX Take 17 g by mouth daily.   Stiolto Respimat 2.5-2.5 MCG/ACT Aers Generic drug: Tiotropium Bromide-Olodaterol Inhale 2 puffs into the lungs daily.       Discharge Assessment: Vitals:   06/19/20 0310 06/19/20 0738  BP: 110/63 127/83  Pulse: (!) 50 (!) 51  Resp: 20 20  Temp: 97.9 F (36.6 C) 98.2 F (36.8 C)  SpO2: 97% 94%   Skin clean, dry and intact without evidence of skin break down, no evidence of skin tears noted. IV catheter discontinued intact. Site without signs and symptoms of complications - no redness or edema noted at insertion site, patient denies c/o pain - only slight tenderness at site.  Dressing with slight pressure applied.  D/c Instructions-Education: Discharge instructions given to patient/family with verbalized understanding. D/c education completed with patient/family including follow up instructions, medication list, d/c activities limitations if indicated, with other d/c instructions as indicated by MD - patient able to verbalize understanding, all questions fully answered. Patient instructed to return to ED, call 911, or call MD for any changes in condition.  Patient escorted via [place], and D/C home via private auto.  Hiram Comber, RN 06/19/2020 12:03 PM

## 2020-06-19 NOTE — Discharge Summary (Signed)
PATIENT DETAILS Name: Nathaniel Long Age: 79 y.o. Sex: male Date of Birth: [birth year] MRN: [id number]. Admitting Physician: Jonetta Osgood, MD [id number], Carloyn Manner, MD  Admit Date: 06/17/2020 Discharge date: 06/19/2020  Recommendations for Outpatient Follow-up:  1. Follow up with PCP in 1-2 weeks 2. Please obtain CMP/CBC in one week 3. Needs endoscopic evaluation next week-GI office will contact patient-see below discussion.  Admitted From:  Home  Disposition: [place]: No  Equipment/Devices: None  Discharge Condition: Stable  CODE STATUS: FULL CODE  Diet recommendation:  Diet Order            Diet - low sodium heart healthy           DIET SOFT Room service appropriate? Yes; Fluid consistency: Thin  Diet effective now                  Brief Summary: Brief Narrative: Patient is a 79 y.o. male with PMHx of HTN, HLD, CKD stage IIIb, COPD-tested positive for COVID-19 on 9/12-presented to the ED on 9/16 with black stools-subsequently admitted to the hospital for further evaluation and treatment.  Per history obtained-patient was having constipation-and took over-the-counter remedies-following which he had black stools.  COVID-19 vaccinated status: Vaccinated  Significant Events: 9/12>> COVID-19 positive 9/16>> Admit to [facility] for evaluation of melena  Significant studies: 9/16>>Chest x-ray: Left basilar scarring, no active disease 9/16>> x-ray abdomen: Lucency around the liver edge and upper abdomen 9/17>> CT abdomen/pelvis: No acute intra-abdominal findings, interstitial opacity in the lower lobes.  COVID-19 medications: 9/17>>casirivimab-imdevimab  Antibiotics: None  Microbiology data: None  Procedures: None  Consults: GI  Brief Hospital Course: Upper GI bleeding with mild acute blood loss anemia: Managed with PPI and observation-GI consulted-since not appearing to have active GI hemorrhage-GI does not plan for endoscopic  evaluation this weekend-they recommend discharge-they will arrange for early follow-up at their office on Monday.  Marland Kitchen  Spoke with GI MD-Dr. Silverio Decamp this morning-see my prior documentation.  Will change PPI to twice daily dosing-and discharge home.  Patient's spouse agreeable to this plan-she is aware that if melena worsens dramatically or patient has frank hematochezia hematemesis-she should seek immediate medical attention.  Patient counseled to avoid NSAIDs and aspirin.  COVID-19 infection: Remains asymptomatic-on room air-no shortness of breath.  Is s/p monoclonal antibody infusion on 9/17.  Note-patient is fully vaccinated.   CKD stage IIIb: Creatinine close to baseline-follow.  COPD: No exacerbation-continue bronchodilators.  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  Constipation: Significantly better with MiraLAX and senna-continue MiraLAX on discharge.  COVID-19 Labs:  No results for input(s): DDIMER, FERRITIN, LDH, CRP in the last 72 hours.  Lab Results  Component Value Date   [place] (A) 06/17/2020   [place] Not Detected 11/07/2019     Discharge Diagnoses:  Principal Problem:   GI bleed Active Problems:   Renovascular hypertension   COVID-19 virus infection   Abdominal pain   CKD (chronic kidney disease) stage 3, GFR 30-59 ml/min   COPD (chronic obstructive pulmonary disease) (HCC)   Hard of hearing   Discharge Instructions:    Person Under Monitoring Name: FELIS QUILLIN  Location: [place] [address]   Infection Prevention Recommendations for Individuals Confirmed to have, or Being Evaluated for, 2019 Novel Coronavirus (COVID-19) Infection Who Receive Care at Home  Individuals who are confirmed to have, or are being evaluated for,  COVID-19 should follow the prevention steps below until a healthcare provider or local  or state health department says they can return to normal activities.  Stay home except to get medical care You should restrict activities outside your home, except for getting medical care. Do not go to work, school, or public areas, and do not use public transportation or taxis.  Call ahead before visiting your doctor Before your medical appointment, call the healthcare provider and tell them that you have, or are being evaluated for, COVID-19 infection. This will help the healthcare provider's office take steps to keep other people from getting infected. Ask your healthcare provider to call the local or state health department.  Monitor your symptoms Seek prompt medical attention if your illness is worsening (e.g., difficulty breathing). Before going to your medical appointment, call the healthcare provider and tell them that you have, or are being evaluated for, COVID-19 infection. Ask your healthcare provider to call the local or state health department.  Wear a facemask You should wear a facemask that covers your nose and mouth when you are in the same room with other people and when you visit a healthcare provider. People who live with or visit you should also wear a facemask while they are in the same room with you.  Separate yourself from other people in your home As much as possible, you should stay in a different room from other people in your home. Also, you should use a separate bathroom, if available.  Avoid sharing household items You should not share dishes, drinking glasses, cups, eating utensils, towels, bedding, or other items with other people in your home. After using these items, you should wash them thoroughly with soap and water.  Cover your coughs and sneezes Cover your mouth and nose with a tissue when you cough or sneeze, or you can cough or sneeze into your sleeve. Throw used tissues in a lined trash can, and immediately wash your hands with soap and water  for at least 20 seconds or use an alcohol-based hand rub.  Wash your [facility] your hands often and thoroughly with soap and water for at least 20 seconds. You can use an alcohol-based hand sanitizer if soap and water are not available and if your hands are not visibly dirty. Avoid touching your eyes, nose, and mouth with unwashed hands.   Prevention Steps for Caregivers and Household Members of Individuals Confirmed to have, or Being Evaluated for, COVID-19 Infection Being Cared for in the Home  If you live with, or provide care at home for, a person confirmed to have, or being evaluated for, COVID-19 infection please follow these guidelines to prevent infection:  Follow healthcare provider's instructions Make sure that you understand and can help the patient follow any healthcare provider instructions for all care.  Provide for the patient's basic needs You should help the patient with basic needs in the home and provide support for getting groceries, prescriptions, and other personal needs.  Monitor the patient's symptoms If they are getting sicker, call his or her medical provider and tell them that the patient has, or is being evaluated for, COVID-19 infection. This will help the healthcare provider's office take steps to keep other people from getting infected. Ask the healthcare provider to call the local or state health department.  Limit the number of people who have contact with the patient  If possible, have only one caregiver for the patient.  Other household members should stay in another home  or place of residence. If this is not possible, they should stay  in another room, or be separated from the patient as much as possible. Use a separate bathroom, if available.  Restrict visitors who do not have an essential need to be in the home.  Keep older adults, very young children, and other sick people away from the patient Keep older adults, very young children, and  those who have compromised immune systems or chronic health conditions away from the patient. This includes people with chronic heart, lung, or kidney conditions, diabetes, and cancer.  Ensure good ventilation Make sure that shared spaces in the home have good air flow, such as from an air conditioner or an opened window, weather permitting.  Wash your hands often  Wash your hands often and thoroughly with soap and water for at least 20 seconds. You can use an alcohol based hand sanitizer if soap and water are not available and if your hands are not visibly dirty.  Avoid touching your eyes, nose, and mouth with unwashed hands.  Use disposable paper towels to dry your hands. If not available, use dedicated cloth towels and replace them when they become wet.  Wear a facemask and gloves  Wear a disposable facemask at all times in the room and gloves when you touch or have contact with the patient's blood, body fluids, and/or secretions or excretions, such as sweat, saliva, sputum, nasal mucus, vomit, urine, or feces.  Ensure the mask fits over your nose and mouth tightly, and do not touch it during use.  Throw out disposable facemasks and gloves after using them. Do not reuse.  Wash your hands immediately after removing your facemask and gloves.  If your personal clothing becomes contaminated, carefully remove clothing and launder. Wash your hands after handling contaminated clothing.  Place all used disposable facemasks, gloves, and other waste in a lined container before disposing them with other household waste.  Remove gloves and wash your hands immediately after handling these items.  Do not share dishes, glasses, or other household items with the patient  Avoid sharing household items. You should not share dishes, drinking glasses, cups, eating utensils, towels, bedding, or other items with a patient who is confirmed to have, or being evaluated for, COVID-19 infection.  After the  person uses these items, you should wash them thoroughly with soap and water.  Wash laundry thoroughly  Immediately remove and wash clothes or bedding that have blood, body fluids, and/or secretions or excretions, such as sweat, saliva, sputum, nasal mucus, vomit, urine, or feces, on them.  Wear gloves when handling laundry from the patient.  Read and follow directions on labels of laundry or clothing items and detergent. In general, wash and dry with the warmest temperatures recommended on the label.  Clean all areas the individual has used often  Clean all touchable surfaces, such as counters, tabletops, doorknobs, bathroom fixtures, toilets, phones, keyboards, tablets, and bedside tables, every day. Also, clean any surfaces that may have blood, body fluids, and/or secretions or excretions on them.  Wear gloves when cleaning surfaces the patient has come in contact with.  Use a diluted bleach solution (e.g., dilute bleach with 1 part bleach and 10 parts water) or a household disinfectant with a label that says EPA-registered for coronaviruses. To make a bleach solution at home, add 1 tablespoon of bleach to 1 quart (4 cups) of water. For a larger supply, add  cup of bleach to 1 gallon (16 cups) of water.  Read labels of cleaning products and follow recommendations provided on product labels. Labels contain instructions for safe and effective use of the cleaning product including precautions you should take when applying the product, such as wearing gloves or eye protection and making sure you have good ventilation during use of the product.  Remove gloves and wash hands immediately after cleaning.  Monitor yourself for signs and symptoms of illness Caregivers and household members are considered close contacts, should monitor their health, and will be asked to limit movement outside of the home to the extent possible. Follow the monitoring steps for close contacts listed on the symptom  monitoring form.   ? If you have additional questions, contact your local health department or call the epidemiologist on call at [phone] (available 24/7). ? This guidance is subject to change. For the most up-to-date guidance from CDC, please refer to their website: YouBlogs.pl    Activity:  As tolerated  Discharge Instructions    Call MD for:   Complete by: As directed    Call MD for:  difficulty breathing, headache or visual disturbances   Complete by: As directed    Call MD for:  extreme fatigue   Complete by: As directed    Call MD for:  persistant dizziness or light-headedness   Complete by: As directed    Diet - low sodium heart healthy   Complete by: As directed    Discharge instructions   Complete by: As directed    Follow with Primary MD  [name], MD in 1-2 weeks  GI MD office will call you for a follow-up appointment early next week to arrange for a outpatient endoscopic evaluation.  If you develop worsening shortness of breath, or if your pulse ox (O2 saturation) readings are in the low 90s/high 80s please seek immediate medical attention.  Avoid medications like aspirin, nonsteroidal anti-inflammatory medications (Aleve, ibuprofen, Advil etc.) until deemed okay by GI MD or your primary care practitioner.  These medications could worsen GI bleeding.  If you develop worsening black stools-or if you develop bloody stools or if you develop bloody vomitus or coffee-ground colored vomitus-please seek immediate medical attention.  Please get a complete blood count and chemistry panel checked by your Primary MD at your next visit, and again as instructed by your Primary MD.  Get Medicines reviewed and adjusted: Please take all your medications with you for your next visit with your Primary MD  Laboratory/radiological data: Please request your Primary MD to go over all hospital tests and  procedure/radiological results at the follow up, please ask your Primary MD to get all Hospital records sent to his/her office.  In some cases, they will be blood work, cultures and biopsy results pending at the time of your discharge. Please request that your primary care M.D. follows up on these results.  Also Note the following: If you experience worsening of your admission symptoms, develop shortness of breath, life threatening emergency, suicidal or homicidal thoughts you must seek medical attention immediately by calling 911 or calling your MD immediately  if symptoms less severe.  You must read complete instructions/literature along with all the possible adverse reactions/side effects for all the Medicines you take and that have been prescribed to you. Take any new Medicines after you have completely understood and accpet all the possible adverse reactions/side effects.   Do not drive when taking Pain medications or sleeping medications (Benzodaizepines)  Do not take more than prescribed Pain, Sleep and Anxiety Medications. It  is not advisable to combine anxiety,sleep and pain medications without talking with your primary care practitioner  Special Instructions: If you have smoked or chewed Tobacco  in the last 2 yrs please stop smoking, stop any regular Alcohol  and or any Recreational drug use.  Wear Seat belts while driving.  Please note: You were cared for by a hospitalist during your hospital stay. Once you are discharged, your primary care physician will handle any further medical issues. Please note that NO REFILLS for any discharge medications will be authorized once you are discharged, as it is imperative that you return to your primary care physician (or establish a relationship with a primary care physician if you do not have one) for your post hospital discharge needs so that they can reassess your need for medications and monitor your lab values.   10 days of isolation from  06/13/2020   Increase activity slowly   Complete by: As directed      Allergies as of 06/19/2020      Reactions   Gabapentin Other (See Comments)   dizziness   Statins Other (See Comments)   Muscle weakness      Medication List    STOP taking these medications   cetirizine 10 MG tablet Commonly known as: ZYRTEC   MEDROL (PAK) PO     TAKE these medications   acetaminophen 500 MG tablet Commonly known as: TYLENOL Take 1,000 mg by mouth every 6 (six) hours as needed.   albuterol (2.5 MG/3ML) 0.083% nebulizer solution Commonly known as: PROVENTIL USE 1 VIAL IN NEBULIZER EVERY 6 HOURS - for wheezing or shortness of breath What changed:   how much to take  how to take this  when to take this  reasons to take this  additional instructions   ALPRAZolam 0.5 MG tablet Commonly known as: XANAX TAKE 1 TABLET BY MOUTH AT BEDTIME.   amLODipine 10 MG tablet Commonly known as: NORVASC TAKE 1/2 TABLET BY MOUTH DAILY What changed: how much to take   calcium carbonate 500 MG chewable tablet Commonly known as: TUMS - dosed in mg elemental calcium Chew 1 tablet by mouth daily as needed for indigestion or heartburn.   CENTRUM PO Take 1 tablet by mouth every evening.   ezetimibe 10 MG tablet Commonly known as: ZETIA Take 5 mg by mouth daily.   fish oil-omega-3 fatty acids 1000 MG capsule Take 1 g by mouth every evening.   Livalo 4 MG Tabs Generic drug: Pitavastatin Calcium TAKE 1/2 TABLET(2 MG) BY MOUTH DAILY What changed: See the new instructions.   metoprolol succinate 25 MG 24 hr tablet Commonly known as: Toprol XL Take 1 tablet (25 mg total) by mouth daily.   pantoprazole 40 MG tablet Commonly known as: PROTONIX Take 1 tablet (40 mg total) by mouth 2 (two) times daily. What changed: See the new instructions.   polyethylene glycol 17 g packet Commonly known as: MIRALAX / GLYCOLAX Take 17 g by mouth daily.   Stiolto Respimat 2.5-2.5 MCG/ACT Aers Generic  drug: Tiotropium Bromide-Olodaterol Inhale 2 puffs into the lungs daily.       Follow-up Information    [name], MD. Schedule an appointment as soon as possible for a visit in 1 week(s).   Specialty: Internal Medicine Contact information: [address] [place] [address] [phone]        Mauri Pole, MD Follow up on 06/21/2020.   Specialty: Gastroenterology Why: Office will call you on Monday for a  follow-up appointment to arrange for outpatient endoscopy-if you do not hear from them, please give them a call. Contact information: [place] [address] [phone]              Allergies  Allergen Reactions  . Gabapentin Other (See Comments)    dizziness  . Statins Other (See Comments)    Muscle weakness     Other Procedures/Studies: DG Abdomen 1 View  Result Date: 06/18/2020 CLINICAL DATA:  COVID.  Abdominal pain EXAM: ABDOMEN - 1 VIEW COMPARISON:  None. FINDINGS: Gas throughout nondistended large and small bowel. No evidence of bowel obstruction or organomegaly. No suspicious calcification or acute bony abnormality. Lucency noted a adjacent to the liver and centrally in the upper chest. While no free air is seen under the diaphragms on today's chest x-ray, cannot exclude pneumoperitoneum. IMPRESSION: Lucency around the liver edge and in the upper abdomen at the midline. Cannot exclude free air. Recommend upright or decubitus abdominal view. Electronically Signed   By: Rolm Baptise M.D.   On: 06/18/2020 05:57   CT ABDOMEN PELVIS W CONTRAST  Result Date: 06/18/2020 CLINICAL DATA:  Bloody stool and diffuse abdominal pain EXAM: CT ABDOMEN AND PELVIS WITH CONTRAST TECHNIQUE: Multidetector CT imaging of the abdomen and pelvis was performed using the standard protocol following bolus administration of intravenous contrast. CONTRAST:  172mL OMNIPAQUE IOHEXOL 300 MG/ML  SOLN COMPARISON:  Head CT 08/10/2017 FINDINGS: Lower chest: Reticulation  in the lower lobes which has a chronic appearance but has occurred since January 12, 2020 CT. Aortic valve calcification. Coronary atherosclerosis. Hepatobiliary: Hepatic steatosis.No evidence of biliary obstruction or stone. Pancreas: Unremarkable. Spleen: Unremarkable. Adrenals/Urinary Tract: Negative adrenals. No hydronephrosis or stone. Unremarkable bladder. Stomach/Bowel: No obstruction. No appendicitis or other visible inflammation. No notable diverticulosis, especially for age. Vascular/Lymphatic: Generalized atheromatous calcification. The major vessels are enhancing. No mass or adenopathy. Reproductive:No pathologic findings. Other: No ascites or pneumoperitoneum. Musculoskeletal: No acute abnormalities. Remote L3 superior endplate fracture. 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 Severe biforaminal impingement at L5-S1. IMPRESSION: 1. No acute intra-abdominal finding or explanation for symptoms. 2. Interstitial opacity in the lower lobes which has occurred since April 2021, question interval COVID. 3. Hepatic steatosis and extensive atherosclerosis. Electronically Signed   By: Monte Fantasia M.D.   On: 06/18/2020 10:15   DG Chest Portable 1 View  Result Date: 06/18/2020 CLINICAL DATA:  COVID.  Right abdominal pain. EXAM: PORTABLE CHEST 1 VIEW COMPARISON:  09/10/2018 FINDINGS: Scarring at the left lung base, stable. Heart is normal size. Right lung clear. No effusions. No acute bony abnormality. IMPRESSION: Left basilar scarring. No active disease. Electronically Signed   By: Rolm Baptise M.D.   On: 06/18/2020 05:55     TODAY-DAY OF DISCHARGE:  Subjective:   [place] today has no headache,no chest abdominal pain,no new weakness tingling or numbness, feels much better wants to go home today.   Objective:   Blood pressure 127/83, pulse (!) 51, temperature 98.2 F (36.8 C), temperature source Oral, resp.  rate 20, height 5\' 11"  (1.803 m), weight 85.6 kg, SpO2 94 %.  Intake/Output Summary (Last 24 hours) at 06/19/2020 1014 Last data filed at 06/19/2020 0313 Gross per 24 hour  Intake 110 ml  Output 925 ml  Net -815 ml   Filed Weights   06/17/20 2257 06/18/20 2250  Weight: 91.6 kg 85.6 kg    Exam: Awake Alert, Oriented *3,  No new F.N deficits, Normal affect [place].AT,PERRAL Supple Neck,No JVD, No cervical lymphadenopathy appriciated.  Symmetrical Chest wall movement, Good air movement bilaterally, CTAB RRR,No Gallops,Rubs or new Murmurs, No Parasternal Heave +ve B.Sounds, Abd Soft, Non tender, No organomegaly appriciated, No rebound -guarding or rigidity. No Cyanosis, Clubbing or edema, No new Rash or bruise   PERTINENT RADIOLOGIC STUDIES: DG Abdomen 1 View  Result Date: 06/18/2020 CLINICAL DATA:  COVID.  Abdominal pain EXAM: ABDOMEN - 1 VIEW COMPARISON:  None. FINDINGS: Gas throughout nondistended large and small bowel. No evidence of bowel obstruction or organomegaly. No suspicious calcification or acute bony abnormality. Lucency noted a adjacent to the liver and centrally in the upper chest. While no free air is seen under the diaphragms on today's chest x-ray, cannot exclude pneumoperitoneum. IMPRESSION: Lucency around the liver edge and in the upper abdomen at the midline. Cannot exclude free air. Recommend upright or decubitus abdominal view. Electronically Signed   By: Rolm Baptise M.D.   On: 06/18/2020 05:57   CT ABDOMEN PELVIS W CONTRAST  Result Date: 06/18/2020 CLINICAL DATA:  Bloody stool and diffuse abdominal pain EXAM: CT ABDOMEN AND PELVIS WITH CONTRAST TECHNIQUE: Multidetector CT imaging of the abdomen and pelvis was performed using the standard protocol following bolus administration of intravenous contrast. CONTRAST:  160mL OMNIPAQUE IOHEXOL 300 MG/ML  SOLN COMPARISON:  Head CT 08/10/2017 FINDINGS: Lower chest: Reticulation in the lower lobes which has a chronic appearance but  has occurred since January 12, 2020 CT. Aortic valve calcification. Coronary atherosclerosis. Hepatobiliary: Hepatic steatosis.No evidence of biliary obstruction or stone. Pancreas: Unremarkable. Spleen: Unremarkable. Adrenals/Urinary Tract: Negative adrenals. No hydronephrosis or stone. Unremarkable bladder. Stomach/Bowel: No obstruction. No appendicitis or other visible inflammation. No notable diverticulosis, especially for age. Vascular/Lymphatic: Generalized atheromatous calcification. The major vessels are enhancing. No mass or adenopathy. Reproductive:No pathologic findings. Other: No ascites or pneumoperitoneum. Musculoskeletal: No acute abnormalities. Remote L3 superior endplate fracture. 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 Severe biforaminal impingement at L5-S1. IMPRESSION: 1. No acute intra-abdominal finding or explanation for symptoms. 2. Interstitial opacity in the lower lobes which has occurred since April 2021, question interval COVID. 3. Hepatic steatosis and extensive atherosclerosis. Electronically Signed   By: Monte Fantasia M.D.   On: 06/18/2020 10:15   DG Chest Portable 1 View  Result Date: 06/18/2020 CLINICAL DATA:  COVID.  Right abdominal pain. EXAM: PORTABLE CHEST 1 VIEW COMPARISON:  09/10/2018 FINDINGS: Scarring at the left lung base, stable. Heart is normal size. Right lung clear. No effusions. No acute bony abnormality. IMPRESSION: Left basilar scarring. No active disease. Electronically Signed   By: Rolm Baptise M.D.   On: 06/18/2020 05:55     PERTINENT LAB RESULTS: CBC: Recent Labs    06/18/20 1058 06/19/20 0816  WBC 15.3* 11.3*  HGB 11.8* 11.1*  HCT 36.5* 34.0*  PLT 324 329   CMET CMP     Component Value Date/Time   NA 139 06/19/2020 0816   K 4.0 06/19/2020 0816   CL 109 06/19/2020 0816   CO2 21 (L) 06/19/2020 0816   GLUCOSE 104 (H) 06/19/2020 0816   BUN 26 (H) 06/19/2020  0816   CREATININE 1.53 (H) 06/19/2020 0816   CREATININE 1.74 (H) 01/12/2020 1429   CREATININE 1.17 02/21/2012 1046   CALCIUM 8.5 (L) 06/19/2020 0816   PROT 6.4 (L) 06/19/2020 0816   ALBUMIN 3.0 (L) 06/19/2020 0816   AST 33 06/19/2020 0816  AST 24 01/12/2020 1429   ALT 32 06/19/2020 0816   ALT 23 01/12/2020 1429   ALKPHOS 54 06/19/2020 0816   BILITOT 1.2 06/19/2020 0816   BILITOT 0.8 01/12/2020 1429   GFRNONAA 43 (L) 06/19/2020 0816   GFRNONAA 37 (L) 01/12/2020 1429   GFRAA 50 (L) 06/19/2020 0816   GFRAA 43 (L) 01/12/2020 1429    GFR Estimated Creatinine Clearance: 42.4 mL/min (A) (by C-G formula based on SCr of 1.53 mg/dL (H)). No results for input(s): LIPASE, AMYLASE in the last 72 hours. No results for input(s): CKTOTAL, CKMB, CKMBINDEX, TROPONINI in the last 72 hours. Invalid input(s): POCBNP No results for input(s): DDIMER in the last 72 hours. No results for input(s): HGBA1C in the last 72 hours. No results for input(s): CHOL, HDL, LDLCALC, TRIG, CHOLHDL, LDLDIRECT in the last 72 hours. No results for input(s): TSH, T4TOTAL, T3FREE, THYROIDAB in the last 72 hours.  Invalid input(s): FREET3 No results for input(s): VITAMINB12, FOLATE, FERRITIN, TIBC, IRON, RETICCTPCT in the last 72 hours. Coags: No results for input(s): INR in the last 72 hours.  Invalid input(s): PT Microbiology: Recent Results (from the past 240 hour(s))  SARS Coronavirus 2 by RT PCR (hospital order, performed in [facility] hospital lab) Nasopharyngeal Nasopharyngeal Swab     Status: Abnormal   Collection Time: 06/17/20  6:44 PM   Specimen: Nasopharyngeal Swab  Result Value Ref Range Status   SARS Coronavirus 2 POSITIVE (A) NEGATIVE Final    Comment: RESULT CALLED TO, READ BACK BY AND VERIFIED WITH: S,TOWNES @2011  06/17/20 EB (NOTE) SARS-CoV-2 target nucleic acids are DETECTED  SARS-CoV-2 RNA is generally detectable in upper respiratory specimens  during the acute phase of infection.  Positive  results are indicative  of the presence of the identified virus, but do not rule out bacterial infection or co-infection with other pathogens not detected by the test.  Clinical correlation with patient history and  other diagnostic information is necessary to determine patient infection status.  The expected result is negative.  Fact Sheet for Patients:   StrictlyIdeas.no   Fact Sheet for Healthcare Providers:   BankingDealers.co.za    This test is not yet approved or cleared by the [place] FDA and  has been authorized for detection and/or diagnosis of SARS-CoV-2 by FDA under an Emergency Use Authorization (EUA).  This EUA will remain in effect (meaning this test can be  used) for the duration of  the COVID-19 declaration under Section 564(b)(1) of the Act, 21 U.S.C. section 360-bbb-3(b)(1), unless the authorization is terminated or revoked sooner.  Performed at [facility], [place] [address], [place], [place] [address]     FURTHER DISCHARGE INSTRUCTIONS:  Get Medicines reviewed and adjusted: Please take all your medications with you for your next visit with your Primary MD  Laboratory/radiological data: Please request your Primary MD to go over all hospital tests and procedure/radiological results at the follow up, please ask your Primary MD to get all Hospital records sent to his/her office.  In some cases, they will be blood work, cultures and biopsy results pending at the time of your discharge. Please request that your primary care M.D. goes through all the records of your hospital data and follows up on these results.  Also Note the following: If you experience worsening of your admission symptoms, develop shortness of breath, life threatening emergency, suicidal or homicidal thoughts you must seek medical attention immediately by calling 911 or calling your MD immediately  if symptoms  less severe.  You must read  complete instructions/literature along with all the possible adverse reactions/side effects for all the Medicines you take and that have been prescribed to you. Take any new Medicines after you have completely understood and accpet all the possible adverse reactions/side effects.   Do not drive when taking Pain medications or sleeping medications (Benzodaizepines)  Do not take more than prescribed Pain, Sleep and Anxiety Medications. It is not advisable to combine anxiety,sleep and pain medications without talking with your primary care practitioner  Special Instructions: If you have smoked or chewed Tobacco  in the last 2 yrs please stop smoking, stop any regular Alcohol  and or any Recreational drug use.  Wear Seat belts while driving.  Please note: You were cared for by a hospitalist during your hospital stay. Once you are discharged, your primary care physician will handle any further medical issues. Please note that NO REFILLS for any discharge medications will be authorized once you are discharged, as it is imperative that you return to your primary care physician (or establish a relationship with a primary care physician if you do not have one) for your post hospital discharge needs so that they can reassess your need for medications and monitor your lab values.  Total Time spent coordinating discharge including counseling, education and face to face time equals 35  minutes.  Signed: Veverly Larimer 06/19/2020 10:14 AM

## 2020-06-19 NOTE — Progress Notes (Signed)
Pt's metoprolol held this morning due to pt's heart rate sustaining in 50's.

## 2020-06-21 ENCOUNTER — Telehealth: Payer: Self-pay

## 2020-06-21 NOTE — Telephone Encounter (Signed)
Patient has been scheduled with Ellouise Newer, PA on 10/20

## 2020-06-21 NOTE — Telephone Encounter (Signed)
-----   Message from Mauri Pole, MD sent at 06/19/2020 10:30 AM EDT ----- Barbera Setters,  This is a very nice gentleman, patient of Dr. Fuller Plan was hospitalized with fatigue, melena and Covid positive on September 12, he received antibodies and also was vaccinated.  He is feeling better , hospitalist is planning to discharge him home today.  Can you bring him in to see Dr. Fuller Plan or one of the apps soon in 2 to 3 weeks, he will need EGD for further evaluation of melena.  Thanks VN

## 2020-07-06 ENCOUNTER — Ambulatory Visit: Payer: Medicare Other | Attending: Internal Medicine

## 2020-07-06 DIAGNOSIS — Z23 Encounter for immunization: Secondary | ICD-10-CM

## 2020-07-06 NOTE — Progress Notes (Signed)
   Covid-19 Vaccination Clinic  Name:  SHAUNN TACKITT    MRN: [id number] DOB: [birth year]  07/06/2020  Mr. Lansdowne was observed post Covid-19 immunization for 15 minutes without incident. He was provided with Vaccine Information Sheet and instruction to access the V-Safe system.   Mr. Dombek was instructed to call 911 with any severe reactions post vaccine: Marland Kitchen Difficulty breathing  . Swelling of face and throat  . A fast heartbeat  . A bad rash all over body  . Dizziness and weakness

## 2020-07-13 ENCOUNTER — Other Ambulatory Visit: Payer: Self-pay | Admitting: Cardiology

## 2020-07-14 ENCOUNTER — Other Ambulatory Visit: Payer: Self-pay | Admitting: Cardiology

## 2020-07-15 ENCOUNTER — Other Ambulatory Visit: Payer: Self-pay | Admitting: Cardiology

## 2020-07-19 ENCOUNTER — Other Ambulatory Visit: Payer: Self-pay | Admitting: Cardiology

## 2020-07-21 ENCOUNTER — Ambulatory Visit: Payer: Medicare Other | Admitting: Physician Assistant

## 2020-07-21 ENCOUNTER — Encounter: Payer: Self-pay | Admitting: Gastroenterology

## 2020-07-21 ENCOUNTER — Other Ambulatory Visit (INDEPENDENT_AMBULATORY_CARE_PROVIDER_SITE_OTHER): Payer: Medicare Other

## 2020-07-21 ENCOUNTER — Encounter: Payer: Self-pay | Admitting: Physician Assistant

## 2020-07-21 VITALS — BP 102/64 | HR 54 | Ht 71.0 in | Wt 197.0 lb

## 2020-07-21 DIAGNOSIS — D649 Anemia, unspecified: Secondary | ICD-10-CM

## 2020-07-21 DIAGNOSIS — K5900 Constipation, unspecified: Secondary | ICD-10-CM | POA: Diagnosis not present

## 2020-07-21 DIAGNOSIS — K921 Melena: Secondary | ICD-10-CM | POA: Diagnosis not present

## 2020-07-21 LAB — CBC WITH DIFFERENTIAL/PLATELET
Basophils Absolute: 0.1 10*3/uL (ref 0.0–0.1)
Basophils Relative: 0.7 % (ref 0.0–3.0)
Eosinophils Absolute: 0.2 10*3/uL (ref 0.0–0.7)
Eosinophils Relative: 2 % (ref 0.0–5.0)
HCT: 35.3 % — ABNORMAL LOW (ref 39.0–52.0)
Hemoglobin: 12 g/dL — ABNORMAL LOW (ref 13.0–17.0)
Lymphocytes Relative: 35.8 % (ref 12.0–46.0)
Lymphs Abs: 3.2 10*3/uL (ref 0.7–4.0)
MCHC: 34.1 g/dL (ref 30.0–36.0)
MCV: 94.3 fl (ref 78.0–100.0)
Monocytes Absolute: 0.8 10*3/uL (ref 0.1–1.0)
Monocytes Relative: 8.7 % (ref 3.0–12.0)
Neutro Abs: 4.7 10*3/uL (ref 1.4–7.7)
Neutrophils Relative %: 52.8 % (ref 43.0–77.0)
Platelets: 348 10*3/uL (ref 150.0–400.0)
RBC: 3.75 Mil/uL — ABNORMAL LOW (ref 4.22–5.81)
RDW: 13.8 % (ref 11.5–15.5)
WBC: 8.9 10*3/uL (ref 4.0–10.5)

## 2020-07-21 NOTE — Patient Instructions (Signed)
You have been scheduled for an endoscopy. Please follow written instructions given to you at your visit today. If you use inhalers (even only as needed), please bring them with you on the day of your procedure.   Your provider has requested that you go to the basement level for lab work before leaving today. Press "B" on the elevator. The lab is located at the first door on the left as you exit the elevator.  Due to recent changes in healthcare laws, you may see the results of your imaging and laboratory studies on MyChart before your provider has had a chance to review them.  We understand that in some cases there may be results that are confusing or concerning to you. Not all laboratory results come back in the same time frame and the provider may be waiting for multiple results in order to interpret others.  Please give [place] 48 hours in order for your provider to thoroughly review all the results before contacting the office for clarification of your results.   I appreciate the  opportunity to care for you  Thank You   Lovett Calender

## 2020-07-21 NOTE — Progress Notes (Signed)
Chief Complaint: Melena  HPI:    Nathaniel Long is a 79 year old male with a past medical history as listed below, known to Dr. Fuller Plan, who was referred to me by [name], MD for follow-up after being seen in the hospital for melena while he was Covid positive.      12/2016 colonoscopy.  Screening study.  Sessile, 6 mm polyp snared from appendiceal orifice (path: SSA).  Small, nonbleeding internal hemorrhoids.  A few diverticuli in the sigmoid.    06/18/2020 patient consulted by our service for melena while he was in the hospital.  At that time described some right lower quadrant pain and constipation.  He had tested positive for Covid on 9/15 though fully faxed.  He was started on a steroid Dosepak and doxycycline.  He is FOBT positive.  Hemoglobin 12.6 (13.8 on 4/21).  On 06/19/2020 we signed off with plans to follow with patient as an outpatient and schedule an EGD after he was no longer positive for Covid.  CBC that day showed a hemoglobin of 11.1.    Today, the patient presents to clinic, again he is very hard of hearing regardless of hearing aids.  Tells me that he has seen no further black stool/dark stool since being started on MiraLAX daily.  He is having 2 soft bowel movements a day and denies any abdominal discomfort, heartburn or reflux.  Continues on Pantoprazole 40 mg twice daily.    Denies fever, chills, weight loss, dizziness, syncope or symptoms that awaken him from sleep.  Past Medical History:  Diagnosis Date  . Anxiety   . Carotid artery stenosis   . Chronic lower back pain   . CKD (chronic kidney disease)   . Complication of anesthesia    "can't pee when I come out of OR" (07/18/2017)  . Contact with chainsaw as cause of accidental injury 07/16/2017   sternal fracture with bilateral anterior fifth and sixth rib fractures /notes 07/16/2017  . COPD (chronic obstructive pulmonary disease) ([place])   . Dyspnea   . GERD (gastroesophageal reflux disease)   . Hard of hearing   . High  cholesterol   . Hypertension   . Peripheral vascular disease ([place])   . Thoracic ascending aortic aneurysm [facility])     Past Surgical History:  Procedure Laterality Date  . ABDOMINAL AORTOGRAM W/LOWER EXTREMITY N/A 05/01/2017   Procedure: Abdominal Aortogram w/Lower Extremity;  Surgeon: Adrian Prows, MD;  Location: [facility];  Service: Cardiovascular;  Laterality: N/A;  . BACK SURGERY    . FINGER REPLANTATION Left    "ring finger"  . HEMORRHOID SURGERY    . POSTERIOR LUMBAR FUSION  1986  . RENAL ANGIOGRAPHY Bilateral 12/18/2017   Procedure: RENAL ANGIOGRAPHY;  Surgeon: Nigel Mormon, MD;  Location: [facility];  Service: Cardiovascular;  Laterality: Bilateral;  . VIDEO ASSISTED THORACOSCOPY (VATS)/ LOBECTOMY Left 09/19/2017   Procedure: LEFT VIDEO ASSISTED THORACOSCOPY (VATS)/ LEFT UPPER LOBECTOMY, LYMPH NODE DISECTION;  Surgeon: Melrose Nakayama, MD;  Location: [facility];  Service: Thoracic;  Laterality: Left;  Marland Kitchen VIDEO BRONCHOSCOPY WITH ENDOBRONCHIAL NAVIGATION N/A 09/05/2017   Procedure: VIDEO BRONCHOSCOPY WITH ENDOBRONCHIAL NAVIGATION;  Surgeon: Collene Gobble, MD;  Location: [facility];  Service: Thoracic;  Laterality: N/A;  . VIDEO BRONCHOSCOPY WITH ENDOBRONCHIAL ULTRASOUND N/A 09/05/2017   Procedure: VIDEO BRONCHOSCOPY WITH ENDOBRONCHIAL ULTRASOUND;  Surgeon: Collene Gobble, MD;  Location: [facility];  Service: Thoracic;  Laterality: N/A;    Current Outpatient Medications  Medication  Sig Dispense Refill  . acetaminophen (TYLENOL) 500 MG tablet Take 1,000 mg by mouth every 6 (six) hours as needed.    Marland Kitchen albuterol (PROVENTIL) (2.5 MG/3ML) 0.083% nebulizer solution USE 1 VIAL IN NEBULIZER EVERY 6 HOURS - for wheezing or shortness of breath (Patient taking differently: Take 2.5 mg by nebulization every 6 (six) hours as needed for wheezing or shortness of breath. ) 75 mL 11  . ALPRAZolam (XANAX) 0.5 MG tablet TAKE 1 TABLET BY MOUTH AT BEDTIME. (Patient taking differently: Take 0.5  mg by mouth at bedtime. ) 30 tablet 3  . amLODipine (NORVASC) 10 MG tablet TAKE 1/2 TABLET BY MOUTH DAILY (Patient taking differently: Take 10 mg by mouth daily. ) 45 tablet 3  . calcium carbonate (TUMS - DOSED IN MG ELEMENTAL CALCIUM) 500 MG chewable tablet Chew 1 tablet by mouth daily as needed for indigestion or heartburn.     . ezetimibe (ZETIA) 10 MG tablet Take 5 mg by mouth daily.  3  . fish oil-omega-3 fatty acids 1000 MG capsule Take 1 g by mouth every evening.     . metoprolol succinate (TOPROL XL) 25 MG 24 hr tablet Take 1 tablet (25 mg total) by mouth daily. 90 tablet 1  . Multiple Vitamins-Minerals (CENTRUM PO) Take 1 tablet by mouth every evening.     . pantoprazole (PROTONIX) 40 MG tablet Take 1 tablet (40 mg total) by mouth 2 (two) times daily. 60 tablet 0  . polyethylene glycol (MIRALAX / GLYCOLAX) 17 g packet Take 17 g by mouth daily. 14 each 0  . Tiotropium Bromide-Olodaterol (STIOLTO RESPIMAT) 2.5-2.5 MCG/ACT AERS Inhale 2 puffs into the lungs daily. 4 g 0   No current facility-administered medications for this visit.   Facility-Administered Medications Ordered in Other Visits  Medication Dose Route Frequency Provider Last Rate Last Admin  . 0.9 %  sodium chloride infusion  250 mL Intravenous PRN Gold, Wayne E, PA-C      . levalbuterol (XOPENEX) nebulizer solution 0.63 mg  0.63 mg Nebulization Q6H PRN Gaye Pollack, MD   0.63 mg at 09/29/17 1646  . sodium chloride flush (NS) 0.9 % injection 3 mL  3 mL Intravenous Q12H Gold, Wayne E, PA-C      . sodium chloride flush (NS) 0.9 % injection 3 mL  3 mL Intravenous Q12H Gold, Wayne E, PA-C      . sodium chloride flush (NS) 0.9 % injection 3 mL  3 mL Intravenous PRN Gold, Wayne E, PA-C        Allergies as of 07/21/2020 - Review Complete 07/21/2020  Allergen Reaction Noted  . Gabapentin Other (See Comments) 04/26/2017  . Statins Other (See Comments) 01/04/2017    Family History  Problem Relation Age of Onset  . Colon  polyps Brother   . Dementia Sister     Social History   Socioeconomic History  . Marital status: Married    Spouse name: Not on file  . Number of children: Not on file  . Years of education: Not on file  . Highest education level: Not on file  Occupational History  . Not on file  Tobacco Use  . Smoking status: Former Smoker    Packs/day: 1.00    Years: 33.00    Pack years: 33.00    Types: Cigarettes    Quit date: 04/17/2017    Years since quitting: 3.2  . Smokeless tobacco: Never Used  . Tobacco comment: May, 2018  Vaping Use  .  Vaping Use: Never used  Substance and Sexual Activity  . Alcohol use: Yes    Comment: last 95  . Drug use: No  . Sexual activity: Not Currently  Other Topics Concern  . Not on file  Social History Narrative  . Not on file   Social Determinants of Health   Financial Resource Strain:   . Difficulty of Paying Living Expenses: Not on file  Food Insecurity:   . Worried About Charity fundraiser in the Last Year: Not on file  . Ran Out of Food in the Last Year: Not on file  Transportation Needs:   . Lack of Transportation (Medical): Not on file  . Lack of Transportation (Non-Medical): Not on file  Physical Activity:   . Days of Exercise per Week: Not on file  . Minutes of Exercise per Session: Not on file  Stress:   . Feeling of Stress : Not on file  Social Connections:   . Frequency of Communication with Friends and Family: Not on file  . Frequency of Social Gatherings with Friends and Family: Not on file  . Attends Religious Services: Not on file  . Active Member of Clubs or Organizations: Not on file  . Attends Archivist Meetings: Not on file  . Marital Status: Not on file  Intimate Partner Violence:   . Fear of Current or Ex-Partner: Not on file  . Emotionally Abused: Not on file  . Physically Abused: Not on file  . Sexually Abused: Not on file    Review of Systems:    Constitutional: No weight loss, fever or  chills Skin: No rash  Cardiovascular: No chest pain Respiratory: No SOB  Gastrointestinal: See HPI and otherwise negative   Physical Exam:  Vital signs: BP 102/64   Pulse (!) 54   Ht 5\' 11"  (1.803 m)   Wt 197 lb (89.4 kg)   BMI 27.48 kg/m   Constitutional:   Pleasant Elderly Caucasian male appears to be in NAD, Well developed, Well nourished, alert and cooperative Respiratory: Respirations even and unlabored. Lungs clear to auscultation bilaterally.   No wheezes, crackles, or rhonchi.  Cardiovascular: Normal S1, S2. No MRG. Regular rate and rhythm. No peripheral edema, cyanosis or pallor.  Gastrointestinal:  Soft, nondistended, nontender. No rebound or guarding. Normal bowel sounds. No appreciable masses or hepatomegaly. Rectal:  Not performed.  Psychiatric: Demonstrates good judgement and reason without abnormal affect or behaviors.  RELEVANT LABS AND IMAGING: CBC    Component Value Date/Time   WBC 11.3 (H) 06/19/2020 0816   RBC 3.53 (L) 06/19/2020 0816   HGB 11.1 (L) 06/19/2020 0816   HGB 13.8 01/12/2020 1429   HCT 34.0 (L) 06/19/2020 0816   PLT 329 06/19/2020 0816   PLT 343 01/12/2020 1429   MCV 96.3 06/19/2020 0816   MCH 31.4 06/19/2020 0816   MCHC 32.6 06/19/2020 0816   RDW 12.9 06/19/2020 0816   LYMPHSABS 4.0 01/12/2020 1429   MONOABS 0.7 01/12/2020 1429   EOSABS 0.2 01/12/2020 1429   BASOSABS 0.1 01/12/2020 1429    CMP     Component Value Date/Time   NA 139 06/19/2020 0816   K 4.0 06/19/2020 0816   CL 109 06/19/2020 0816   CO2 21 (L) 06/19/2020 0816   GLUCOSE 104 (H) 06/19/2020 0816   BUN 26 (H) 06/19/2020 0816   CREATININE 1.53 (H) 06/19/2020 0816   CREATININE 1.74 (H) 01/12/2020 1429   CREATININE 1.17 02/21/2012 1046   CALCIUM 8.5 (L)  06/19/2020 0816   PROT 6.4 (L) 06/19/2020 0816   ALBUMIN 3.0 (L) 06/19/2020 0816   AST 33 06/19/2020 0816   AST 24 01/12/2020 1429   ALT 32 06/19/2020 0816   ALT 23 01/12/2020 1429   ALKPHOS 54 06/19/2020 0816    BILITOT 1.2 06/19/2020 0816   BILITOT 0.8 01/12/2020 1429   GFRNONAA 43 (L) 06/19/2020 0816   GFRNONAA 37 (L) 01/12/2020 1429   GFRAA 50 (L) 06/19/2020 0816   GFRAA 43 (L) 01/12/2020 1429    Assessment: 1. Melena: Reported during recent hospitalization, patient tells me he is no longer seeing any dark/black stools; consider upper GI source most likely 2. Anemia: Question relation to above 3.  Constipation: Better with daily MiraLAX  Plan: 1.  Scheduled patient for an EGD in the [place] with Dr. Fuller Plan.  Did discuss risks, benefits, limitations and alternatives and the patient agrees to proceed. 2.  Continue Pantoprazole 40 mg twice daily 3.  Continue MiraLAX daily 4.  Recheck CBC today 5.  Patient to follow in clinic per recommendations from Dr. Fuller Plan after time of procedure.  Ellouise Newer, PA-C [facility] 07/21/2020, 9:09 AM  Cc: [name], MD

## 2020-07-21 NOTE — Progress Notes (Signed)
Reviewed and agree with management plan.  Ellayna Hilligoss T. Lyndsie Wallman, MD FACG [facility]  

## 2020-07-23 NOTE — Progress Notes (Signed)
Primary Physician/Referring:  [name], MD  Patient ID: Nathaniel Long, male    DOB: Mar 21, 1941, 79 y.o.   MRN: [id number]  Chief Complaint  Patient presents with   Follow-up    1 year    HPI:    Nathaniel Long  is a 79 y.o. 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 Angiograms could not explain his right hip pain and right leg pain and was recommended evaluation for pseudo-claudication.  Patient presents for annual follow-up, he is presently doing well without symptoms.  Denies claudication, chest pain, shortness of breath, swelling.  He states he continues to be active, enjoys fishing frequently.  He does mention he has been having trouble accessing Livalo stating that the pharmacy is often out of it.  When patient is taking Livalo he is tolerating it well.   Past Medical History:  Diagnosis Date   Anxiety    Carotid artery stenosis    Chronic lower back pain    CKD (chronic kidney disease)    Complication of anesthesia    "can't pee when I come out of OR" (07/18/2017)   Contact with chainsaw as cause of accidental injury 07/16/2017   sternal fracture with bilateral anterior fifth and sixth rib fractures /notes 07/16/2017   COPD (chronic obstructive pulmonary disease) (HCC)    Dyspnea    GERD (gastroesophageal reflux disease)    Hard of hearing    High cholesterol    Hypertension    Peripheral vascular disease ([place])    Thoracic ascending aortic aneurysm [facility])    Past Surgical History:  Procedure Laterality Date   ABDOMINAL AORTOGRAM W/LOWER EXTREMITY N/A 05/01/2017    Procedure: Abdominal Aortogram w/Lower Extremity;  Surgeon: Adrian Prows, MD;  Location: [place] [facility];  Service: Cardiovascular;  Laterality: N/A;   BACK SURGERY     FINGER REPLANTATION Left    "ring finger"   HEMORRHOID SURGERY     POSTERIOR LUMBAR FUSION  1986   RENAL ANGIOGRAPHY Bilateral 12/18/2017   Procedure: RENAL ANGIOGRAPHY;  Surgeon: Nigel Mormon, MD;  Location: [place] [facility];  Service: Cardiovascular;  Laterality: Bilateral;   VIDEO ASSISTED THORACOSCOPY (VATS)/ LOBECTOMY Left 09/19/2017   Procedure: LEFT VIDEO ASSISTED THORACOSCOPY (VATS)/ LEFT UPPER LOBECTOMY, LYMPH NODE DISECTION;  Surgeon: Melrose Nakayama, MD;  Location: [place];  Service: Thoracic;  Laterality: Left;   VIDEO BRONCHOSCOPY WITH ENDOBRONCHIAL NAVIGATION N/A 09/05/2017   Procedure: VIDEO BRONCHOSCOPY WITH ENDOBRONCHIAL NAVIGATION;  Surgeon: Collene Gobble, MD;  Location: [facility];  Service: Thoracic;  Laterality: N/A;   VIDEO BRONCHOSCOPY WITH ENDOBRONCHIAL ULTRASOUND N/A 09/05/2017   Procedure: VIDEO BRONCHOSCOPY WITH ENDOBRONCHIAL ULTRASOUND;  Surgeon: Collene Gobble, MD;  Location: [facility];  Service: Thoracic;  Laterality: N/A;   Family History  Problem Relation Age of Onset   Colon polyps Brother    Dementia Sister     Social History   Tobacco Use   Smoking status: Former Smoker    Packs/day: 1.00    Years: 33.00    Pack years: 33.00    Types: Cigarettes    Quit date: 04/17/2017    Years since quitting:  3.2   Smokeless tobacco: Never Used   Tobacco comment: May, 2018  Substance Use Topics   Alcohol use: Yes    Comment: last 95   Marital Status: Married   ROS  Review of Systems  HENT: Positive for hearing loss.   Cardiovascular: Positive for claudication (right leg pseudoclaudication, resolved). Negative for chest pain, dyspnea on exertion, leg swelling, orthopnea, palpitations, paroxysmal nocturnal dyspnea and syncope.  Respiratory: Negative for shortness of  breath.   Hematologic/Lymphatic: Does not bruise/bleed easily.  Musculoskeletal: Positive for back pain.    Objective  Blood pressure 104/70, pulse (!) 54, resp. rate 16, height '5\' 11"'  (1.803 m), weight 194 lb (88 kg), SpO2 95 %.  Vitals with BMI 07/26/2020 07/21/2020 06/19/2020  Height '5\' 11"'  '5\' 11"'  -  Weight 194 lbs 197 lbs -  BMI 51.76 16.07 -  Systolic 371 062 694  Diastolic 70 64 83  Pulse 54 54 51      Physical Exam Vitals reviewed.  HENT:     Head: Normocephalic and atraumatic.  Cardiovascular:     Rate and Rhythm: Normal rate and regular rhythm.     Pulses: Intact distal pulses.          Carotid pulses are 2+ on the right side with bruit and 2+ on the left side with bruit.      Popliteal pulses are 2+ on the right side and 2+ on the left side.       Dorsalis pedis pulses are 2+ on the right side and 0 on the left side.       Posterior tibial pulses are 0 on the right side and 2+ on the left side.     Heart sounds: S1 normal and S2 normal. Murmur heard.  Early systolic murmur is present with a grade of 3/6 at the upper right sternal border.  Blowing midsystolic murmur of grade 2/6 is also present at the apex.  No gallop.   Pulmonary:     Effort: Pulmonary effort is normal. Prolonged expiration present. No respiratory distress.     Breath sounds: No wheezing, rhonchi or rales.     Comments: Thorocotomy for lobectomy.  Musculoskeletal:     Right lower leg: No edema.     Left lower leg: No edema.  Neurological:     Mental Status: He is alert.   Vitals reviewed. Laboratory examination:   Recent Labs    06/17/20 1852 06/18/20 1058 06/19/20 0816  NA 135 135 139  K 4.3 4.3 4.0  CL 102 106 109  CO2 21* 19* 21*  GLUCOSE 140* 116* 104*  BUN 33* 28* 26*  CREATININE 1.54* 1.56* 1.53*  CALCIUM 8.7* 8.2* 8.5*  GFRNONAA 43* 42* 43*  GFRAA 49* 49* 50*   CrCl cannot be calculated (Patient's most recent lab result is older than the maximum 21 days allowed.).  CMP  Latest Ref Rng & Units 06/19/2020 06/18/2020 06/17/2020  Glucose 70 - 99 mg/dL 104(H) 116(H) 140(H)  BUN 8 - 23 mg/dL 26(H) 28(H) 33(H)  Creatinine 0.61 - 1.24 mg/dL 1.53(H) 1.56(H) 1.54(H)  Sodium 135 - 145 mmol/L 139 135 135  Potassium 3.5 - 5.1 mmol/L 4.0 4.3 4.3  Chloride 98 - 111 mmol/L 109 106 102  CO2 22 - 32 mmol/L 21(L) 19(L) 21(L)  Calcium 8.9 - 10.3 mg/dL 8.5(L) 8.2(L) 8.7(L)  Total Protein 6.5 - 8.1 g/dL 6.4(L) 6.7 6.9  Total Bilirubin 0.3 - 1.2 mg/dL 1.2 0.8 0.7  Alkaline Phos 38 -  126 U/L 54 54 65  AST 15 - 41 U/L 33 40 57(H)  ALT 0 - 44 U/L 32 36 44   CBC Latest Ref Rng & Units 07/21/2020 06/19/2020 06/18/2020  WBC 4.0 - 10.5 K/uL 8.9 11.3(H) 15.3(H)  Hemoglobin 13.0 - 17.0 g/dL 12.0(L) 11.1(L) 11.8(L)  Hematocrit 39 - 52 % 35.3(L) 34.0(L) 36.5(L)  Platelets 150 - 400 K/uL 348.0 329 324    Lipid Panel No results for input(s): CHOL, TRIG, LDLCALC, VLDL, HDL, CHOLHDL, LDLDIRECT in the last 8760 hours.  HEMOGLOBIN A1C Lab Results  Component Value Date   HGBA1C 6.0 02/21/2012   TSH No results for input(s): TSH in the last 8760 hours.  External labs:  07/02/2020:  Hemoglobin 11.1, hematocrit 33.2, RBC 3.46, platelet 160, CBC otherwise normal Sodium 137 potassium 4.9 BUN 15 creatinine 1.61, CMP otherwise normal EGFR 40  10/07/2015: HDL 35, LDL 99, total cholesterol 167, triglycerides 166   Medications and allergies   Allergies  Allergen Reactions   Gabapentin Other (See Comments)    dizziness   Statins Other (See Comments)    Muscle weakness     Outpatient Medications Prior to Visit  Medication Sig Dispense Refill   albuterol (PROVENTIL) (2.5 MG/3ML) 0.083% nebulizer solution USE 1 VIAL IN NEBULIZER EVERY 6 HOURS - for wheezing or shortness of breath (Patient taking differently: Take 2.5 mg by nebulization every 6 (six) hours as needed for wheezing or shortness of breath. ) 75 mL 11   ALPRAZolam (XANAX) 0.5 MG tablet TAKE 1 TABLET BY MOUTH AT BEDTIME.  (Patient taking differently: Take 0.5 mg by mouth at bedtime. ) 30 tablet 3   amLODipine (NORVASC) 10 MG tablet TAKE 1/2 TABLET BY MOUTH DAILY (Patient taking differently: Take 10 mg by mouth daily. ) 45 tablet 3   calcium carbonate (TUMS - DOSED IN MG ELEMENTAL CALCIUM) 500 MG chewable tablet Chew 1 tablet by mouth daily as needed for indigestion or heartburn.      ezetimibe (ZETIA) 10 MG tablet Take 5 mg by mouth daily.  3   fish oil-omega-3 fatty acids 1000 MG capsule Take 1 g by mouth every evening.      metoprolol succinate (TOPROL XL) 25 MG 24 hr tablet Take 1 tablet (25 mg total) by mouth daily. 90 tablet 1   Multiple Vitamins-Minerals (CENTRUM PO) Take 1 tablet by mouth every evening.      pantoprazole (PROTONIX) 40 MG tablet Take 1 tablet (40 mg total) by mouth 2 (two) times daily. 60 tablet 0   polyethylene glycol (MIRALAX / GLYCOLAX) 17 g packet Take 17 g by mouth daily. 14 each 0   Tiotropium Bromide-Olodaterol (STIOLTO RESPIMAT) 2.5-2.5 MCG/ACT AERS Inhale 2 puffs into the lungs daily. 4 g 0   acetaminophen (TYLENOL) 500 MG tablet Take 1,000 mg by mouth every 6 (six) hours as needed.     Facility-Administered Medications Prior to Visit  Medication Dose Route Frequency Provider Last Rate Last Admin   0.9 %  sodium chloride infusion  250 mL Intravenous PRN Gold, Wayne E, PA-C       levalbuterol (XOPENEX) nebulizer solution 0.63 mg  0.63 mg Nebulization Q6H PRN Gaye Pollack, MD   0.63 mg at 09/29/17 1646   sodium chloride flush (NS) 0.9 % injection 3 mL  3 mL Intravenous Q12H Gold, Wayne E, PA-C       sodium chloride flush (NS) 0.9 % injection 3 mL  3 mL Intravenous Q12H Gold, Wilder Glade, PA-C  sodium chloride flush (NS) 0.9 % injection 3 mL  3 mL Intravenous PRN [name], PA-C         Radiology:   No results found.  Cardiac Studies:   Carotid Doppler  09/11/2017: Minimal stenosis in the right internal carotid artery (1-15%). Stenosis in the left  internal carotid artery (16-49%). Mild stenosis in the left common carotid artery (<50%). Mild stenosis in the left external carotid artery (<50%). Left vertebral artery waveform shows high resistance and may suggest hemodynamically significant stenosis. Antegrade right vertebral artery flow. Follow up in one year is appropriate if clinically indicated. Compared to the study done on 03/08/2017, left ICA stenosis severity reduced from >50%. Otherwise no significant change.  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 Previously noted 90% stenosis by angiogram on 05/01/2017 not noted.  Nuclear stress test  08/31/2017: 1. Pharmacologic stress testing was performed with intravenous administration of .4 mg of Lexiscan. 2. Stress EKG is non diagnostic for ischemia as it is a pharmacologic stress. 3. Gated SPECT images reveal normal myocardial thickening and wall motion. The left ventricular ejection fraction was calculated to be 60%. SPECT images demonstrate small perfusion abnormality of mild intensity in the basal inferior and mid inferior myocardial wall(s) on the stress images with mild reversibility on rest images. This could represent diaphragmatic attenuation. 4. This is a low risk study.  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 Right PT and left AT occluded below knee, but otherwise mild disease.  Echocardiogram [02/23/2016]: Left ventricle cavity is normal in size. Mild concentric hypertrophy of the left ventricle. Normal global wall motion. Doppler evidence of grade I (impaired) diastolic dysfunction. LVEF visully estimated at 55-60%. Mild calcification of the aortic valve annulus. Mildly restricted aortic valve leaflets. Mild aortic valve leaflet calcification. No aortic valve regurgitation noted. Trace aortic valve stenosis. Aortic valve peak pressure gradient of  24 and mean gradient of 9 mmHg, calculated aortic valve area 2.32 cm. Mild tricuspid regurgitation. No evidence of pulmonary hypertension.  EKG:   EKG 07/26/2020: Sinus bradycardia at a rate of 52 bpm. Normal axis.  Left ventricular hypertrophy.  Nonspecific T wave abnormality.  Poor R wave progression, cannot exclude anterior infarct old.  Compared to EKG 08/12/2019, PRWP new.   Assessment     ICD-10-CM   1. PAD (peripheral artery disease) (HCC)  I73.9 EKG 12-Lead  2. Hyperlipidemia, group A  E78.00 Pitavastatin Calcium (LIVALO) 4 MG TABS    Lipid Panel With LDL/HDL Ratio    PCV CAROTID DUPLEX (BILATERAL)  3. Bilateral carotid bruits  R09.89 PCV CAROTID DUPLEX (BILATERAL)  4. Aortic valve stenosis, etiology of cardiac valve disease unspecified  I35.0 PCV ECHOCARDIOGRAM COMPLETE  5. Primary hypertension  I10 PCV ECHOCARDIOGRAM COMPLETE     Medications Discontinued During This Encounter  Medication Reason   acetaminophen (TYLENOL) 500 MG tablet Patient Preference    Meds ordered this encounter  Medications   Pitavastatin Calcium (LIVALO) 4 MG TABS    Sig: Take 0.5 tablets (2 mg total) by mouth daily.    Dispense:  30 tablet    Refill:  3    Recommendations:   Nathaniel Long is a 79 y.o. 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  Patient presents for annual follow-up.  He is presently asymptomatic from a peripheral arterial disease standpoint, denying claudication symptoms and vascular examination remained stable.  There is no recent lipid panel for review, will obtain lipid profile testing.  At this time we will continue Livalo 2 mg daily and Zetia 10 mg daily.  As it appears to be difficult for patient to obtain Livalo, provided samples today.   If it continues to be difficult for pharmacies to fill this prescription can reevaluate in the future other options for continued lipid management.  Will reevaluate lipid management when lab results are available.  Blood pressure remains well controlled, will continue metoprolol succinate 25 mg daily.  Upon physical exam patient noted to have bilateral carotid bruits left greater than the right.  Compared to previous exam these seem to be more prominent.  Last carotid duplex was in 2018, will repeat bilateral carotid duplex at this time.  Also on exam patient noted to have new systolic murmur at the apex as well as early systolic murmur at the right upper sternal border seemingly of greater intensity than previous exam.  Will obtain echocardiogram.  I have personally reviewed external labs, patient's hemoglobin noted to be low at 11.1.  Recommend follow-up with primary care provider.  Patient with stage IIIb chronic kidney disease.  Follow-up in 3 months for hypercholesterolemia, cardiac testing results.   During this visit I reviewed and updated: Tobacco history   allergies  medication reconciliation   medical history   surgical history   family history   social history.  This note was created using a voice recognition software as a result there may be grammatical errors inadvertently enclosed that do not reflect the nature of this encounter. Every attempt is made to correct such errors.   Alethia Berthold, PA-C 07/26/2020, 12:17 PM Office: [phone]

## 2020-07-26 ENCOUNTER — Other Ambulatory Visit: Payer: Self-pay

## 2020-07-26 ENCOUNTER — Ambulatory Visit: Payer: Medicare Other | Admitting: Student

## 2020-07-26 ENCOUNTER — Encounter: Payer: Self-pay | Admitting: Student

## 2020-07-26 VITALS — BP 104/70 | HR 54 | Resp 16 | Ht 71.0 in | Wt 194.0 lb

## 2020-07-26 DIAGNOSIS — I739 Peripheral vascular disease, unspecified: Secondary | ICD-10-CM

## 2020-07-26 DIAGNOSIS — I350 Nonrheumatic aortic (valve) stenosis: Secondary | ICD-10-CM

## 2020-07-26 DIAGNOSIS — I10 Essential (primary) hypertension: Secondary | ICD-10-CM

## 2020-07-26 DIAGNOSIS — R0989 Other specified symptoms and signs involving the circulatory and respiratory systems: Secondary | ICD-10-CM

## 2020-07-26 DIAGNOSIS — E7800 Pure hypercholesterolemia, unspecified: Secondary | ICD-10-CM

## 2020-07-26 MED ORDER — LIVALO 4 MG PO TABS: 2.0000 mg | ORAL_TABLET | Freq: Every day | ORAL | 3 refills | Status: DC

## 2020-07-26 NOTE — Patient Instructions (Signed)
Lab work at [facility]

## 2020-07-27 ENCOUNTER — Other Ambulatory Visit: Payer: Self-pay

## 2020-07-27 DIAGNOSIS — I10 Essential (primary) hypertension: Secondary | ICD-10-CM

## 2020-07-27 LAB — LIPID PANEL WITH LDL/HDL RATIO
Cholesterol, Total: 114 mg/dL (ref 100–199)
HDL: 36 mg/dL — ABNORMAL LOW (ref >39)
LDL Chol Calc (NIH): 56 mg/dL (ref 0–99)
LDL/HDL Ratio: 1.6 ratio (ref 0.0–3.6)
Triglycerides: 123 mg/dL (ref 0–149)
VLDL Cholesterol Cal: 22 mg/dL (ref 5–40)

## 2020-07-27 NOTE — Progress Notes (Signed)
Please inform patient his cholesterol is under good control. Will continue current medications.

## 2020-07-27 NOTE — Progress Notes (Signed)
Unable to reach patient/leave vm. Will cb at a later date.

## 2020-07-27 NOTE — Progress Notes (Signed)
Called patient, NA, No VMbox to leave a message.

## 2020-07-28 ENCOUNTER — Encounter: Payer: Self-pay | Admitting: Gastroenterology

## 2020-07-28 ENCOUNTER — Other Ambulatory Visit: Payer: Self-pay

## 2020-07-28 ENCOUNTER — Ambulatory Visit (AMBULATORY_SURGERY_CENTER): Payer: Medicare Other | Admitting: Gastroenterology

## 2020-07-28 VITALS — BP 102/66 | HR 56 | Temp 96.9°F | Resp 13 | Ht 71.0 in | Wt 197.0 lb

## 2020-07-28 DIAGNOSIS — R195 Other fecal abnormalities: Secondary | ICD-10-CM

## 2020-07-28 DIAGNOSIS — K3189 Other diseases of stomach and duodenum: Secondary | ICD-10-CM | POA: Diagnosis not present

## 2020-07-28 DIAGNOSIS — K921 Melena: Secondary | ICD-10-CM

## 2020-07-28 DIAGNOSIS — K222 Esophageal obstruction: Secondary | ICD-10-CM | POA: Diagnosis not present

## 2020-07-28 DIAGNOSIS — K2970 Gastritis, unspecified, without bleeding: Secondary | ICD-10-CM | POA: Diagnosis not present

## 2020-07-28 MED ORDER — PANTOPRAZOLE SODIUM 40 MG PO TBEC: 40.0000 mg | DELAYED_RELEASE_TABLET | Freq: Every day | ORAL | 3 refills | Status: DC

## 2020-07-28 MED ORDER — SODIUM CHLORIDE 0.9 % IV SOLN: 500.0000 mL | INTRAVENOUS | Status: DC

## 2020-07-28 NOTE — Op Note (Signed)
[place] Patient Name: Nathaniel Long Procedure Date: 07/28/2020 2:14 PM MRN: [id number] Endoscopist: Ladene Artist , MD Age: 79 Referring MD:  Date of Birth: 1941/02/21 Gender: Male Account #: [id number] Procedure:                Upper GI endoscopy Indications:              Heme positive stool, Melena Medicines:                Monitored Anesthesia Care Procedure:                Pre-Anesthesia Assessment:                           - Prior to the procedure, a History and Physical                            was performed, and patient medications and                            allergies were reviewed. The patient's tolerance of                            previous anesthesia was also reviewed. The risks                            and benefits of the procedure and the sedation                            options and risks were discussed with the patient.                            All questions were answered, and informed consent                            was obtained. Prior Anticoagulants: The patient has                            taken no previous anticoagulant or antiplatelet                            agents. ASA Grade Assessment: III - A patient with                            severe systemic disease. After reviewing the risks                            and benefits, the patient was deemed in                            satisfactory condition to undergo the procedure.                           After obtaining informed consent, the endoscope was  passed under direct vision. Throughout the                            procedure, the patient's blood pressure, pulse, and                            oxygen saturations were monitored continuously. The                            Endoscope was introduced through the mouth, and                            advanced to the second part of duodenum. The upper                            GI endoscopy was  accomplished without difficulty.                            The patient tolerated the procedure well. Scope In: Scope Out: Findings:                 The examined esophagus was normal.                           Patchy mildly erythematous mucosa without bleeding                            was found in the prepyloric region of the stomach.                            Biopsies were taken with a cold forceps for                            histology.                           The exam of the stomach was otherwise normal.                           An acquired benign-appearing, intrinsic mild                            stenosis was found in the second portion of the                            duodenum and was traversed.                           The exam of the duodenum was otherwise normal. Complications:            No immediate complications. Estimated Blood Loss:     Estimated blood loss was minimal. Impression:               - Normal esophagus.                           -  Erythematous mucosa in the prepyloric region of                            the stomach. Biopsied.                           - Acquired duodenal stenosis. Recommendation:           - Patient has a contact number available for                            emergencies. The signs and symptoms of potential                            delayed complications were discussed with the                            patient. Return to normal activities tomorrow.                            Written discharge instructions were provided to the                            patient.                           - Resume previous diet.                           - Continue present medications.                           - Change pantoprazole to 40 mg po qd.                           - Await pathology results.                           - Etiology of self limited melena 6 weeks ago and                            heme positive stool is not clear. If  recurrent                            bleeding noted consider repeat colonoscopy and/or                            VCE.                           - Return to PCP for ongoing care.                           - GI follow up as needed. Ladene Artist, MD 07/28/2020 2:38:28 PM This report has been signed electronically.

## 2020-07-28 NOTE — Progress Notes (Signed)
Report given to PACU, vss 

## 2020-07-28 NOTE — Patient Instructions (Signed)
Please read handouts provided. Continue present medications. Change pantoprazole to 40 mg daily. Await pathology results. Return to PCP for ongoing care. GI follow-up as needed.     YOU HAD AN ENDOSCOPIC PROCEDURE TODAY AT [facility]:   Refer to the procedure report that was given to you for any specific questions about what was found during the examination.  If the procedure report does not answer your questions, please call your gastroenterologist to clarify.  If you requested that your care partner not be given the details of your procedure findings, then the procedure report has been included in a sealed envelope for you to review at your convenience later.  YOU SHOULD EXPECT: Some feelings of bloating in the abdomen. Passage of more gas than usual.  Walking can help get rid of the air that was put into your GI tract during the procedure and reduce the bloating. If you had a lower endoscopy (such as a colonoscopy or flexible sigmoidoscopy) you may notice spotting of blood in your stool or on the toilet paper. If you underwent a bowel prep for your procedure, you may not have a normal bowel movement for a few days.  Please Note:  You might notice some irritation and congestion in your nose or some drainage.  This is from the oxygen used during your procedure.  There is no need for concern and it should clear up in a day or so.  SYMPTOMS TO REPORT IMMEDIATELY:     Following upper endoscopy (EGD)  Vomiting of blood or coffee ground material  New chest pain or pain under the shoulder blades  Painful or persistently difficult swallowing  New shortness of breath  Fever of 100F or higher  Black, tarry-looking stools  For urgent or emergent issues, a gastroenterologist can be reached at any hour by calling [phone]. Do not use MyChart messaging for urgent concerns.    DIET:  We do recommend a small meal at first, but then you may proceed to your regular diet.   Drink plenty of fluids but you should avoid alcoholic beverages for 24 hours.  ACTIVITY:  You should plan to take it easy for the rest of today and you should NOT DRIVE or use heavy machinery until tomorrow (because of the sedation medicines used during the test).    FOLLOW UP: Our staff will call the number listed on your records 48-72 hours following your procedure to check on you and address any questions or concerns that you may have regarding the information given to you following your procedure. If we do not reach you, we will leave a message.  We will attempt to reach you two times.  During this call, we will ask if you have developed any symptoms of COVID 19. If you develop any symptoms (ie: fever, flu-like symptoms, shortness of breath, cough etc.) before then, please call [phone].  If you test positive for Covid 19 in the 2 weeks post procedure, please call and report this information to [place].    If any biopsies were taken you will be contacted by phone or by letter within the next 1-3 weeks.  Please call us at [phone] if you have not heard about the biopsies in 3 weeks.    SIGNATURES/CONFIDENTIALITY: You and/or your care partner have signed paperwork which will be entered into your electronic medical record.  These signatures attest to the fact that that the information above on your After Visit Summary has been reviewed and  is understood.  Full responsibility of the confidentiality of this discharge information lies with you and/or your care-partner.

## 2020-07-28 NOTE — Progress Notes (Signed)
Called to room to assist during endoscopic procedure.  Patient ID and intended procedure confirmed with present staff. Received instructions for my participation in the procedure from the performing physician.  

## 2020-07-28 NOTE — Progress Notes (Signed)
1422 Robinul 0.1 mg IV given due large amount of secretions upon assessment.  MD made aware, vss

## 2020-07-30 ENCOUNTER — Telehealth: Payer: Self-pay | Admitting: *Deleted

## 2020-07-30 NOTE — Telephone Encounter (Signed)
  Follow up Call-  Call back number 07/28/2020  Post procedure Call Back phone  # [phone]  Permission to leave phone message Yes  Some recent data might be hidden     Patient questions:  Do you have a fever, pain , or abdominal swelling? No. Pain Score  0 *  Have you tolerated food without any problems? Yes.    Have you been able to return to your normal activities? Yes.    Do you have any questions about your discharge instructions: Diet   No. Medications  No. Follow up visit  No.  Do you have questions or concerns about your Care? No.  Actions: * If pain score is 4 or above: No action needed, pain <4.  1. Have you developed a fever since your procedure? no  2.   Have you had an respiratory symptoms (SOB or cough) since your procedure? no  3.   Have you tested positive for COVID 19 since your procedure no  4.   Have you had any family members/close contacts diagnosed with the COVID 19 since your procedure?  no   If yes to any of these questions please route to Joylene John, RN and Joella Prince, RN

## 2020-08-06 ENCOUNTER — Ambulatory Visit: Payer: Medicare Other

## 2020-08-06 ENCOUNTER — Other Ambulatory Visit: Payer: Self-pay

## 2020-08-06 DIAGNOSIS — E7800 Pure hypercholesterolemia, unspecified: Secondary | ICD-10-CM

## 2020-08-06 DIAGNOSIS — R0989 Other specified symptoms and signs involving the circulatory and respiratory systems: Secondary | ICD-10-CM

## 2020-08-06 DIAGNOSIS — I10 Essential (primary) hypertension: Secondary | ICD-10-CM

## 2020-08-06 DIAGNOSIS — I350 Nonrheumatic aortic (valve) stenosis: Secondary | ICD-10-CM

## 2020-08-07 ENCOUNTER — Other Ambulatory Visit: Payer: Self-pay | Admitting: Cardiology

## 2020-08-07 DIAGNOSIS — I6523 Occlusion and stenosis of bilateral carotid arteries: Secondary | ICD-10-CM

## 2020-08-09 ENCOUNTER — Encounter: Payer: Self-pay | Admitting: Gastroenterology

## 2020-08-09 NOTE — Progress Notes (Signed)
Patient informed. 

## 2020-08-09 NOTE — Progress Notes (Signed)
Please inform patient there has been some progression of carotid artery stenosis. We will continue to manage his cholesterol and repeat carotid scan in 6 months. Echo showed some leaky valves and some muscle thickening. Will discuss further at visit in Jan.

## 2020-08-09 NOTE — Progress Notes (Signed)
Called pt to inform him about his duplex result pt understood.

## 2020-08-30 ENCOUNTER — Other Ambulatory Visit: Payer: Self-pay | Admitting: Cardiology

## 2020-08-30 DIAGNOSIS — I10 Essential (primary) hypertension: Secondary | ICD-10-CM

## 2020-09-11 ENCOUNTER — Other Ambulatory Visit: Payer: Self-pay | Admitting: Cardiology

## 2020-09-21 ENCOUNTER — Other Ambulatory Visit: Payer: Self-pay | Admitting: Cardiology

## 2020-09-21 DIAGNOSIS — F411 Generalized anxiety disorder: Secondary | ICD-10-CM

## 2020-09-21 DIAGNOSIS — F5101 Primary insomnia: Secondary | ICD-10-CM

## 2020-09-23 ENCOUNTER — Other Ambulatory Visit: Payer: Self-pay

## 2020-09-23 ENCOUNTER — Other Ambulatory Visit: Payer: Self-pay | Admitting: Cardiology

## 2020-09-23 DIAGNOSIS — F5101 Primary insomnia: Secondary | ICD-10-CM

## 2020-09-23 DIAGNOSIS — F411 Generalized anxiety disorder: Secondary | ICD-10-CM

## 2020-09-28 ENCOUNTER — Telehealth: Payer: Self-pay

## 2020-09-28 DIAGNOSIS — F5101 Primary insomnia: Secondary | ICD-10-CM

## 2020-09-28 DIAGNOSIS — F411 Generalized anxiety disorder: Secondary | ICD-10-CM

## 2020-09-28 MED ORDER — ALPRAZOLAM 0.5 MG PO TABS: 0.5000 mg | ORAL_TABLET | Freq: Every evening | ORAL | 1 refills | Status: DC | PRN

## 2020-09-28 NOTE — Telephone Encounter (Signed)
Patient called requesting a refill on his Alprazolam unfortunately we can not refill it without your permission, can we refill pt rx please advise

## 2020-09-28 NOTE — Telephone Encounter (Signed)
ICD-10-CM   1. Generalized anxiety disorder  F41.1 ALPRAZolam (XANAX) 0.5 MG tablet  2. Primary insomnia  F51.01 ALPRAZolam (XANAX) 0.5 MG tablet   Meds ordered this encounter  Medications  . ALPRAZolam (XANAX) 0.5 MG tablet    Sig: Take 1 tablet (0.5 mg total) by mouth at bedtime as needed for anxiety.    Dispense:  90 tablet    Refill:  1    Not to exceed 2 additional fills before 11/14/2020     Adrian Prows, MD, [facility] 09/28/2020, 9:35 AM Office: [phone] Pager: [phone]

## 2020-10-06 ENCOUNTER — Other Ambulatory Visit: Payer: Self-pay | Admitting: Cardiology

## 2020-10-06 DIAGNOSIS — F5101 Primary insomnia: Secondary | ICD-10-CM

## 2020-10-06 DIAGNOSIS — F411 Generalized anxiety disorder: Secondary | ICD-10-CM

## 2020-10-25 NOTE — Progress Notes (Signed)
Primary Physician/Referring:  [name], MD  Patient ID: Nathaniel Long, male    DOB: 08-12-1941, 80 y.o.   MRN: [id number]  Chief Complaint  Patient presents with   PAD   Results   Follow-up   HPI:    Nathaniel Long  is a 80 y.o. 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 Angiograms could not explain his right hip pain and right leg pain and was recommended evaluation for pseudo-claudication. Patient with history of upper GI bleed in 06/2020.   Notably patient is hard of hearing despite use of hearing aides.   Patient presents for 3 month follow up for hypercholesteremia and results of cardiac testing. He continues to do well without specific complaints today.  Denies claudication, chest pain, shortness of breath, swelling. Denies recurrence of melena since hospitalization in September 2021.  He states he continues to be active, fishing and working on cars frequently.   Past Medical History:  Diagnosis Date   Anxiety    Cancer ([place])    lung   Carotid artery stenosis    Cataract    Chronic lower back pain    CKD (chronic kidney disease)    Complication of anesthesia    "can't pee when I come out of OR" (07/18/2017)   Contact with chainsaw as cause of accidental injury 07/16/2017   sternal fracture with bilateral anterior fifth and sixth rib fractures /notes 07/16/2017   COPD (chronic obstructive pulmonary disease) (H[facility])    Dyspnea    GERD (gastroesophageal reflux disease)    Hard of hearing    High cholesterol    Hypertension    Peripheral vascular disease ([place])     Thoracic ascending aortic aneurysm [facility])    Past Surgical History:  Procedure Laterality Date   ABDOMINAL AORTOGRAM W/LOWER EXTREMITY N/A 05/01/2017   Procedure: Abdominal Aortogram w/Lower Extremity;  Surgeon: Adrian Prows, MD;  Location: [place] [facility];  Service: Cardiovascular;  Laterality: N/A;   BACK SURGERY     FINGER REPLANTATION Left    "ring finger"   HEMORRHOID SURGERY     POSTERIOR LUMBAR FUSION  1986   RENAL ANGIOGRAPHY Bilateral 12/18/2017   Procedure: RENAL ANGIOGRAPHY;  Surgeon: Nigel Mormon, MD;  Location: [place] [facility];  Service: Cardiovascular;  Laterality: Bilateral;   VIDEO ASSISTED THORACOSCOPY (VATS)/ LOBECTOMY Left 09/19/2017   Procedure: LEFT VIDEO ASSISTED THORACOSCOPY (VATS)/ LEFT UPPER LOBECTOMY, LYMPH NODE DISECTION;  Surgeon: Melrose Nakayama, MD;  Location: [place];  Service: Thoracic;  Laterality: Left;   VIDEO BRONCHOSCOPY WITH ENDOBRONCHIAL NAVIGATION N/A 09/05/2017   Procedure: VIDEO BRONCHOSCOPY WITH ENDOBRONCHIAL NAVIGATION;  Surgeon: Collene Gobble, MD;  Location: [facility];  Service: Thoracic;  Laterality: N/A;   VIDEO BRONCHOSCOPY WITH ENDOBRONCHIAL ULTRASOUND N/A 09/05/2017   Procedure: VIDEO BRONCHOSCOPY WITH ENDOBRONCHIAL ULTRASOUND;  Surgeon: Collene Gobble, MD;  Location: [facility] OR;  Service: Thoracic;  Laterality: N/A;   Family History  Problem Relation Age of Onset   Colon polyps Brother    Colon cancer Brother    Dementia Sister     Social History   Tobacco Use   Smoking status: Former Smoker  Packs/day: 1.00    Years: 33.00    Pack years: 33.00    Types: Cigarettes    Quit date: 04/17/2017    Years since quitting: 3.5   Smokeless tobacco: Never Used   Tobacco comment: May, 2018  Substance Use Topics   Alcohol use: Not Currently    Comment: last drink 1995   Marital Status: Married   ROS  Review of Systems  Constitutional: Negative for malaise/fatigue and weight gain.  HENT: Positive for hearing  loss.   Cardiovascular: Negative for chest pain, claudication, dyspnea on exertion, leg swelling, near-syncope, orthopnea, palpitations, paroxysmal nocturnal dyspnea and syncope.  Respiratory: Negative for shortness of breath.   Hematologic/Lymphatic: Does not bruise/bleed easily.  Musculoskeletal: Positive for back pain.  Gastrointestinal: Negative for melena.  Neurological: Negative for dizziness and weakness.    Objective  Blood pressure 122/85, pulse (!) 55, temperature 97.8 F (36.6 C), height _0  (1.803 m), weight 198 lb (89.8 kg), SpO2 95 %.  Vitals with BMI 10/26/2020 07/28/2020 07/28/2020  Height _1  - -  Weight 198 lbs - -  BMI 79.89 - -  Systolic 211 941 87  Diastolic 85 66 50  Pulse 55 56 58      Physical Exam Vitals reviewed.  HENT:     Head: Normocephalic and atraumatic.  Cardiovascular:     Rate and Rhythm: Normal rate and regular rhythm.     Pulses: Intact distal pulses.          Carotid pulses are 2+ on the right side with bruit and 2+ on the left side with bruit.      Popliteal pulses are 2+ on the right side and 2+ on the left side.       Dorsalis pedis pulses are 2+ on the right side and 0 on the left side.       Posterior tibial pulses are 0 on the right side and 2+ on the left side.     Heart sounds: S1 normal and S2 normal. Murmur heard.   Early systolic murmur is present with a grade of 3/6 at the upper right sternal border.  Blowing midsystolic murmur of grade 2/6 is also present at the apex. No gallop.   Pulmonary:     Effort: Pulmonary effort is normal. Prolonged expiration present. No respiratory distress.     Breath sounds: No wheezing, rhonchi or rales.     Comments: Thorocotomy for lobectomy.  Musculoskeletal:     Right lower leg: No edema.     Left lower leg: No edema.  Neurological:     Mental Status: He is alert.   Vitals reviewed. Laboratory examination:   Recent Labs    06/17/20 1852 06/18/20 1058 06/19/20 0816  NA 135 135  139  K 4.3 4.3 4.0  CL 102 106 109  CO2 21* 19* 21*  GLUCOSE 140* 116* 104*  BUN 33* 28* 26*  CREATININE 1.54* 1.56* 1.53*  CALCIUM 8.7* 8.2* 8.5*  GFRNONAA 43* 42* 43*  GFRAA 49* 49* 50*   CrCl cannot be calculated (Patient's most recent lab result is older than the maximum 21 days allowed.).  CMP Latest Ref Rng & Units 06/19/2020 06/18/2020 06/17/2020  Glucose 70 - 99 mg/dL 104(H) 116(H) 140(H)  BUN 8 - 23 mg/dL 26(H) 28(H) 33(H)  Creatinine 0.61 - 1.24 mg/dL 1.53(H) 1.56(H) 1.54(H)  Sodium 135 - 145 mmol/L 139 135 135  Potassium 3.5 - 5.1 mmol/L 4.0 4.3 4.3  Chloride 98 - 111  mmol/L 109 106 102  CO2 22 - 32 mmol/L 21(L) 19(L) 21(L)  Calcium 8.9 - 10.3 mg/dL 8.5(L) 8.2(L) 8.7(L)  Total Protein 6.5 - 8.1 g/dL 6.4(L) 6.7 6.9  Total Bilirubin 0.3 - 1.2 mg/dL 1.2 0.8 0.7  Alkaline Phos 38 - 126 U/L 54 54 65  AST 15 - 41 U/L 33 40 57(H)  ALT 0 - 44 U/L 32 36 44   CBC Latest Ref Rng & Units 07/21/2020 06/19/2020 06/18/2020  WBC 4.0 - 10.5 K/uL 8.9 11.3(H) 15.3(H)  Hemoglobin 13.0 - 17.0 g/dL 12.0(L) 11.1(L) 11.8(L)  Hematocrit 39.0 - 52.0 % 35.3(L) 34.0(L) 36.5(L)  Platelets 150.0 - 400.0 K/uL 348.0 329 324    Lipid Panel Recent Labs    07/26/20 1406  CHOL 114  TRIG 123  LDLCALC 56  HDL 36*    HEMOGLOBIN A1C Lab Results  Component Value Date   HGBA1C 6.0 02/21/2012   TSH No results for input(s): TSH in the last 8760 hours.  External labs:  10/12/2020: Total cholesterol 117, triglycerides 79, HDL 41, LDL 60 Hemoglobin 13.4, hematocrit 41.0, MCV 94.4, platelets 317 Sodium 138, potassium 4.5, glucose 102, BUN 22, creatinine 1.51, GFR 43 A1c 6.7%  07/02/2020:  Hemoglobin 11.1, hematocrit 33.2, RBC 3.46, platelet 160, CBC otherwise normal Sodium 137 potassium 4.9 BUN 15 creatinine 1.61, CMP otherwise normal EGFR 40  10/07/2015: HDL 35, LDL 99, total cholesterol 167, triglycerides 166   Medications and allergies   Allergies  Allergen Reactions   Gabapentin Other  (See Comments)    dizziness   Statins Other (See Comments)    Muscle weakness     Outpatient Medications Prior to Visit  Medication Sig Dispense Refill   albuterol (PROVENTIL) (2.5 MG/3ML) 0.083% nebulizer solution USE 1 VIAL IN NEBULIZER EVERY 6 HOURS - for wheezing or shortness of breath 75 mL 11   ALPRAZolam (XANAX) 0.5 MG tablet TAKE 1 TABLET BY MOUTH EVERYDAY AT BEDTIME 30 tablet 3   amLODipine (NORVASC) 10 MG tablet TAKE 1/2 TABLET BY MOUTH DAILY (Patient taking differently: Take 10 mg by mouth daily.) 45 tablet 3   calcium carbonate (TUMS - DOSED IN MG ELEMENTAL CALCIUM) 500 MG chewable tablet Chew 1 tablet by mouth daily as needed for indigestion or heartburn.     ezetimibe (ZETIA) 10 MG tablet Take 5 mg by mouth daily.  3   fish oil-omega-3 fatty acids 1000 MG capsule Take 1 g by mouth every evening.     metoprolol succinate (TOPROL-XL) 25 MG 24 hr tablet TAKE 1 TABLET BY MOUTH EVERY DAY 90 tablet 1   Multiple Vitamins-Minerals (CENTRUM PO) Take 1 tablet by mouth every evening.     pantoprazole (PROTONIX) 40 MG tablet TAKE 1 TABLET BY MOUTH ONCE DAILY 1/2 HOUR BEFORE A  MEAL 90 tablet 3   polyethylene glycol (MIRALAX / GLYCOLAX) 17 g packet Take 17 g by mouth daily. 14 each 0   Tiotropium Bromide-Olodaterol (STIOLTO RESPIMAT) 2.5-2.5 MCG/ACT AERS Inhale 2 puffs into the lungs daily. 4 g 0   Pitavastatin Calcium (LIVALO) 4 MG TABS Take 0.5 tablets (2 mg total) by mouth daily. 30 tablet 3   Facility-Administered Medications Prior to Visit  Medication Dose Route Frequency Provider Last Rate Last Admin   0.9 %  sodium chloride infusion  250 mL Intravenous PRN Gold, Wayne E, PA-C       levalbuterol (XOPENEX) nebulizer solution 0.63 mg  0.63 mg Nebulization Q6H PRN Gaye Pollack, MD   0.63 mg at  09/29/17 1646   sodium chloride flush (NS) 0.9 % injection 3 mL  3 mL Intravenous Q12H Gold, Wayne E, PA-C       sodium chloride flush (NS) 0.9 % injection 3 mL  3 mL  Intravenous Q12H Gold, Wayne E, PA-C       sodium chloride flush (NS) 0.9 % injection 3 mL  3 mL Intravenous PRN [name], PA-C         Radiology:   No results found.  Cardiac Studies:   Carotid artery duplex 13-Aug-2020:  Stenosis in the right internal carotid artery (16-49%). Stenosis in the right common carotid artery (<50%). Stenosis in the right external carotid artery (<50%).  Stenosis in the left internal carotid artery (50-69%). Stenosis in the left common carotid artery (>50%). Stenosis in the left external carotid artery (<50%).  Antegrade right vertebral artery flow. Antegrade left vertebral artery flow.  Compared to 09/11/2017, there is progression of disease bilaterally, especially in common carotid arteries with homogenous plaque with high risk for embolic complications. Follow up in 6 months is appropriate if clinically indicated.   PCV ECHOCARDIOGRAM COMPLETE 94/85/4627 Normal LV systolic function with visual EF 55-60%. Left ventricle cavity is normal in size. Moderate concentric hypertrophy of the left ventricle. Normal global wall motion. Left atrial cavity is moderately dilated at 4.6 cm. Trileaflet aortic valve. Mild aortic stenosis. Trace aortic regurgitation. Moderate aortic valve leaflet calcification. Mildly restricted aortic valve leaflets. Peak PG of 19.2 and mean PG of 10.3 mm Hg and Calculated aortic valve area 1.7 cm. Normal mitral valve thickness.  Mild prolapse of the mitral valve leaflets. Moderate posteriorly directed  (Grade III) mitral regurgitation. Mild tricuspid regurgitation. No evidence of pulmonary hypertension. RVSP measures 30 mmHg. Compared to 02/24/2016, MVP and MR new.  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 Previously noted 90% stenosis by angiogram on 05/01/2017 not noted.  Nuclear stress test  08/31/2017: 1. Pharmacologic stress testing was  performed with intravenous administration of .4 mg of Lexiscan. 2. Stress EKG is non diagnostic for ischemia as it is a pharmacologic stress. 3. Gated SPECT images reveal normal myocardial thickening and wall motion. The left ventricular ejection fraction was calculated to be 60%. SPECT images demonstrate small perfusion abnormality of mild intensity in the basal inferior and mid inferior myocardial wall(s) on the stress images with mild reversibility on rest images. This could represent diaphragmatic attenuation. 4. This is a low risk study.  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 Right PT and left AT occluded below knee, but otherwise mild disease.  Echocardiogram [02/23/2016]: Left ventricle cavity is normal in size. Mild concentric hypertrophy of the left ventricle. Normal global wall motion. Doppler evidence of grade I (impaired) diastolic dysfunction. LVEF visully estimated at 55-60%. Mild calcification of the aortic valve annulus. Mildly restricted aortic valve leaflets. Mild aortic valve leaflet calcification. No aortic valve regurgitation noted. Trace aortic valve stenosis. Aortic valve peak pressure gradient of 24 and mean gradient of 9 mmHg, calculated aortic valve area 2.32 cm. Mild tricuspid regurgitation. No evidence of pulmonary hypertension.  EKG:   EKG 07/26/2020: Sinus bradycardia at a rate of 52 bpm. Normal axis.  Left ventricular hypertrophy.  Nonspecific T wave abnormality.  Poor R wave progression, cannot exclude anterior infarct old.  Compared to EKG 08/12/2019, PRWP new.   Assessment     ICD-10-CM   1. Asymptomatic bilateral carotid artery stenosis  I65.23  2. Primary hypertension  I10   3. Hyperlipidemia, group A  E78.00 Pitavastatin Calcium (LIVALO) 4 MG TABS  4. Nonrheumatic mitral valve regurgitation  I34.0   5. MVP (mitral valve prolapse)  I34.1      Medications Discontinued During This Encounter  Medication  Reason   Pitavastatin Calcium (LIVALO) 4 MG TABS Reorder    Meds ordered this encounter  Medications   Pitavastatin Calcium (LIVALO) 4 MG TABS    Sig: Take 0.5 tablets (2 mg total) by mouth daily.    Dispense:  45 tablet    Refill:  3    Recommendations:   JONATHEN RATHMAN is a 80 y.o. 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 Angiograms could not explain his right hip pain and right leg pain and was recommended evaluation for pseudo-claudication. Patient with history of upper GI bleed in 06/2020.   Patient presents for 3 month follow up of hyperlipidemia and cardiac testing results. Lipids are well controlled with LDL of 60. Will continue pitavastatin and Zetia, as well as fish oil. Reviewed and discussed with patient results of echocardiogram and carotid artery duplex. Carotid duplex revealed progression of disease bilaterally, will continue aggressive lipid management and add aspirin 81 mg daily. Repeat carotid artery duplex in 6 months.  In view of upper GI bleed in 06/2020, discussed addition of aspirin with GI provider who is comfortable with patient taking aspirin 81 mg daily. Counseled patient to notify our office or PCP immediately if has recurrence of melena. Echocardiogram revealed new mitral valve prolapse and mitral regurgitation with continue surveillance.   Encouraged patient to continue to work on diet and lifestyle modifications. I have personally reviewed external labs.   Follow up in 6 months for MR, carotid disease, hyperlipidemia.    Alethia Berthold, PA-C 10/26/2020, 12:38 PM Office:  [phone]

## 2020-10-26 ENCOUNTER — Encounter: Payer: Self-pay | Admitting: Student

## 2020-10-26 ENCOUNTER — Ambulatory Visit: Payer: Medicare Other | Admitting: Student

## 2020-10-26 ENCOUNTER — Other Ambulatory Visit: Payer: Self-pay

## 2020-10-26 VITALS — BP 122/85 | HR 55 | Temp 97.8°F | Ht 71.0 in | Wt 198.0 lb

## 2020-10-26 DIAGNOSIS — I341 Nonrheumatic mitral (valve) prolapse: Secondary | ICD-10-CM

## 2020-10-26 DIAGNOSIS — I340 Nonrheumatic mitral (valve) insufficiency: Secondary | ICD-10-CM

## 2020-10-26 DIAGNOSIS — I10 Essential (primary) hypertension: Secondary | ICD-10-CM

## 2020-10-26 DIAGNOSIS — E7800 Pure hypercholesterolemia, unspecified: Secondary | ICD-10-CM

## 2020-10-26 DIAGNOSIS — I6523 Occlusion and stenosis of bilateral carotid arteries: Secondary | ICD-10-CM

## 2020-10-26 MED ORDER — LIVALO 4 MG PO TABS: 2.0000 mg | ORAL_TABLET | Freq: Every day | ORAL | 3 refills | Status: DC

## 2020-11-05 ENCOUNTER — Other Ambulatory Visit: Payer: Medicare Other

## 2020-11-05 DIAGNOSIS — Z20822 Contact with and (suspected) exposure to covid-19: Secondary | ICD-10-CM

## 2020-11-06 LAB — SARS-COV-2, NAA 2 DAY TAT

## 2020-11-06 LAB — NOVEL CORONAVIRUS, NAA: SARS-CoV-2, NAA: NOT DETECTED

## 2020-12-28 ENCOUNTER — Other Ambulatory Visit: Payer: Self-pay

## 2020-12-28 DIAGNOSIS — I10 Essential (primary) hypertension: Secondary | ICD-10-CM

## 2020-12-28 MED ORDER — METOPROLOL SUCCINATE ER 25 MG PO TB24: 25.0000 mg | ORAL_TABLET | Freq: Every day | ORAL | 1 refills | Status: DC

## 2021-01-06 ENCOUNTER — Telehealth: Payer: Self-pay | Admitting: Physician Assistant

## 2021-01-06 NOTE — Progress Notes (Signed)
[facility] OFFICE PROGRESS NOTE  [name], MD [address] [place] [address]  DIAGNOSIS: Stage IA(T1a, N0, M0) non-small cell lung cancer, adenocarcinoma presented with left upper lobe lung nodule.  PRIOR THERAPY: Status post left upper lobectomy with lymph node dissection on September 19, 2017 under the care of Dr. Roxan Hockey.   CURRENT THERAPY: Observation  INTERVAL HISTORY: Nathaniel Long 80 y.o. male returns to the clinic today for a follow up visit. The patient is status post left upper lobectomy under the care of Dr. Roxan Hockey in December 2018. The patient was last seen in April 2021. Today, the patient is feeling well today without any concerning complaints.  Denies any fever, chills, night sweats, or weight loss. Denies any chest pain, cough, shortness of breath, or hemoptysis. Denies any nausea, vomiting, diarrhea, or constipation. Denies any headache or visual changes. He recently had a restaging CT scan. He is here for evaluation and to review his scan results.    MEDICAL HISTORY: Past Medical History:  Diagnosis Date  . Anxiety   . Cancer ([place])    lung  . Carotid artery stenosis   . Cataract   . Chronic lower back pain   . CKD (chronic kidney disease)   . Complication of anesthesia    "can't pee when I come out of OR" (07/18/2017)  . Contact with chainsaw as cause of accidental injury 07/16/2017   sternal fracture with bilateral anterior fifth and sixth rib fractures /notes 07/16/2017  . COPD (chronic obstructive pulmonary disease) ([place])   . Dyspnea   . GERD (gastroesophageal reflux disease)   . Hard of hearing   . High cholesterol   . Hypertension   . Peripheral vascular disease ([place])   . Thoracic ascending aortic aneurysm ([facility])     ALLERGIES:  is allergic to gabapentin and statins.  MEDICATIONS:  Current Outpatient Medications  Medication Sig Dispense Refill  . albuterol (PROVENTIL) (2.5 MG/3ML) 0.083% nebulizer solution USE 1  VIAL IN NEBULIZER EVERY 6 HOURS - for wheezing or shortness of breath 75 mL 11  . ALPRAZolam (XANAX) 0.5 MG tablet TAKE 1 TABLET BY MOUTH EVERYDAY AT BEDTIME 30 tablet 3  . amLODipine (NORVASC) 10 MG tablet TAKE 1/2 TABLET BY MOUTH DAILY (Patient taking differently: Take 10 mg by mouth daily.) 45 tablet 3  . benzonatate (TESSALON) 200 MG capsule Take 200 mg by mouth 3 (three) times daily as needed.    . calcium carbonate (TUMS - DOSED IN MG ELEMENTAL CALCIUM) 500 MG chewable tablet Chew 1 tablet by mouth daily as needed for indigestion or heartburn.    . ezetimibe (ZETIA) 10 MG tablet Take 5 mg by mouth daily.  3  . fish oil-omega-3 fatty acids 1000 MG capsule Take 1 g by mouth every evening.    . metoprolol succinate (TOPROL-XL) 25 MG 24 hr tablet Take 1 tablet (25 mg total) by mouth daily. 90 tablet 1  . Multiple Vitamins-Minerals (CENTRUM PO) Take 1 tablet by mouth every evening.    . pantoprazole (PROTONIX) 40 MG tablet TAKE 1 TABLET BY MOUTH ONCE DAILY 1/2 HOUR BEFORE A  MEAL 90 tablet 3  . Pitavastatin Calcium (LIVALO) 4 MG TABS Take 0.5 tablets (2 mg total) by mouth daily. 45 tablet 3  . polyethylene glycol (MIRALAX / GLYCOLAX) 17 g packet Take 17 g by mouth daily. 14 each 0  . Tiotropium Bromide-Olodaterol (STIOLTO RESPIMAT) 2.5-2.5 MCG/ACT AERS Inhale 2 puffs into the lungs daily. 4 g 0  No current facility-administered medications for this visit.   Facility-Administered Medications Ordered in Other Visits  Medication Dose Route Frequency Provider Last Rate Last Admin  . 0.9 %  sodium chloride infusion  250 mL Intravenous PRN Gold, Wayne E, PA-C      . levalbuterol (XOPENEX) nebulizer solution 0.63 mg  0.63 mg Nebulization Q6H PRN Gaye Pollack, MD   0.63 mg at 09/29/17 1646  . sodium chloride flush (NS) 0.9 % injection 3 mL  3 mL Intravenous Q12H Gold, Wayne E, PA-C      . sodium chloride flush (NS) 0.9 % injection 3 mL  3 mL Intravenous Q12H Gold, Wayne E, PA-C      . sodium  chloride flush (NS) 0.9 % injection 3 mL  3 mL Intravenous PRN [name], PA-C        SURGICAL HISTORY:  Past Surgical History:  Procedure Laterality Date  . ABDOMINAL AORTOGRAM W/LOWER EXTREMITY N/A 05/01/2017   Procedure: Abdominal Aortogram w/Lower Extremity;  Surgeon: Adrian Prows, MD;  Location: [facility];  Service: Cardiovascular;  Laterality: N/A;  . BACK SURGERY    . FINGER REPLANTATION Left    "ring finger"  . HEMORRHOID SURGERY    . POSTERIOR LUMBAR FUSION  1986  . RENAL ANGIOGRAPHY Bilateral 12/18/2017   Procedure: RENAL ANGIOGRAPHY;  Surgeon: Nigel Mormon, MD;  Location: [facility];  Service: Cardiovascular;  Laterality: Bilateral;  . VIDEO ASSISTED THORACOSCOPY (VATS)/ LOBECTOMY Left 09/19/2017   Procedure: LEFT VIDEO ASSISTED THORACOSCOPY (VATS)/ LEFT UPPER LOBECTOMY, LYMPH NODE DISECTION;  Surgeon: Melrose Nakayama, MD;  Location: [facility];  Service: Thoracic;  Laterality: Left;  Marland Kitchen VIDEO BRONCHOSCOPY WITH ENDOBRONCHIAL NAVIGATION N/A 09/05/2017   Procedure: VIDEO BRONCHOSCOPY WITH ENDOBRONCHIAL NAVIGATION;  Surgeon: Collene Gobble, MD;  Location: [facility];  Service: Thoracic;  Laterality: N/A;  . VIDEO BRONCHOSCOPY WITH ENDOBRONCHIAL ULTRASOUND N/A 09/05/2017   Procedure: VIDEO BRONCHOSCOPY WITH ENDOBRONCHIAL ULTRASOUND;  Surgeon: Collene Gobble, MD;  Location: [facility];  Service: Thoracic;  Laterality: N/A;    REVIEW OF SYSTEMS:   Review of Systems  Constitutional: Negative for appetite change, chills, fatigue, fever and unexpected weight change.  HENT:  Negative for mouth sores, nosebleeds, sore throat and trouble swallowing.   Eyes: Negative for eye problems and icterus.  Respiratory: Negative for cough, hemoptysis, shortness of breath and wheezing.   Cardiovascular: Negative for chest pain and leg swelling.  Gastrointestinal: Negative for abdominal pain, constipation, diarrhea, nausea and vomiting.  Genitourinary: Negative for bladder  incontinence, difficulty urinating, dysuria, frequency and hematuria.   Musculoskeletal: Negative for back pain, gait problem, neck pain and neck stiffness.  Skin: Negative for itching and rash.  Neurological: Negative for dizziness, extremity weakness, gait problem, headaches, light-headedness and seizures.  Hematological: Negative for adenopathy. Does not bruise/bleed easily.  Psychiatric/Behavioral: Negative for confusion, depression and sleep disturbance. The patient is not nervous/anxious.     PHYSICAL EXAMINATION:  Blood pressure (!) 152/55, pulse (!) 50, temperature 98.3 F (36.8 C), temperature source Tympanic, resp. rate 12, height 5\' 10"  (1.778 m), weight 194 lb (88 kg), SpO2 97 %.  ECOG PERFORMANCE STATUS: 0 - Asymptomatic  Physical Exam  Constitutional: Oriented to person, place, and time and well-developed, well-nourished, and in no distress.  HENT:  Head: Normocephalic and atraumatic.  Mouth/Throat: Oropharynx is clear and moist. No oropharyngeal exudate.  Eyes: Conjunctivae are normal. Right eye exhibits no discharge. Left eye exhibits no discharge. No scleral icterus.  Neck:  Normal range of motion. Neck supple.  Cardiovascular: Normal rate, regular rhythm, normal heart sounds and intact distal pulses.   Pulmonary/Chest: Effort normal and breath sounds normal. No respiratory distress. No wheezes. No rales.  Abdominal: Soft. Bowel sounds are normal. Exhibits no distension and no mass. There is no tenderness.  Musculoskeletal: Normal range of motion. Exhibits no edema.  Lymphadenopathy:    No cervical adenopathy.  Neurological: Alert and oriented to person, place, and time. Exhibits normal muscle tone. Gait normal. Coordination normal.  Skin: Skin is warm and dry. No rash noted. Not diaphoretic. No erythema. No pallor.  Psychiatric: Mood, memory and judgment normal.  Vitals reviewed.  LABORATORY DATA: Lab Results  Component Value Date   WBC 8.1 01/10/2021   HGB 13.7  01/10/2021   HCT 41.5 01/10/2021   MCV 95.0 01/10/2021   PLT 318 01/10/2021      Chemistry      Component Value Date/Time   NA 140 01/10/2021 1109   K 4.4 01/10/2021 1109   CL 109 01/10/2021 1109   CO2 21 (L) 01/10/2021 1109   BUN 22 01/10/2021 1109   CREATININE 1.69 (H) 01/10/2021 1109   CREATININE 1.17 02/21/2012 1046      Component Value Date/Time   CALCIUM 8.8 (L) 01/10/2021 1109   ALKPHOS 66 01/10/2021 1109   AST 36 01/10/2021 1109   ALT 23 01/10/2021 1109   BILITOT 1.5 (H) 01/10/2021 1109       RADIOGRAPHIC STUDIES:  CT Chest Wo Contrast  Result Date: 01/11/2021 CLINICAL DATA:  Primary Cancer Type: Lung Imaging Indication: Routine surveillance Interval therapy since last imaging? No Initial Cancer Diagnosis Date: 09/05/2017; Established by: Biopsy-proven Detailed Pathology: Stage IA non-small cell lung cancer, adenocarcinoma. Primary Tumor location: Left upper lobe. History of trauma to the chest 07/16/2017 with sternal and rib fractures. Surgeries: Left upper lobectomy 09/19/2017. Chemotherapy: No Immunotherapy? No Radiation therapy? No EXAM: CT CHEST WITHOUT CONTRAST TECHNIQUE: Multidetector CT imaging of the chest was performed following the standard protocol without IV contrast. COMPARISON:  Most recent CT chest 01/12/2020. 08/10/2017 PET-CT. CT chest 07/16/2017. FINDINGS: Cardiovascular: Aortic atherosclerosis. Aortic valve calcifications. Normal heart size. Three-vessel coronary artery calcifications. No pericardial effusion. Mediastinum/Nodes: No enlarged mediastinal, hilar, or axillary lymph nodes. Thyroid gland, trachea, and esophagus demonstrate no significant findings. Lungs/Pleura: Status post left upper lobectomy. Unchanged bandlike scarring of the paramedian left lower lobe and dependent lung base (series 5, image 64, 83). No pleural effusion or pneumothorax. Upper Abdomen: No acute abnormality. Musculoskeletal: No chest wall mass or suspicious bone lesions  identified. IMPRESSION: 1. Status post left upper lobectomy. Unchanged bandlike scarring of the paramedian left lower lobe and dependent lung base. 2. No evidence of recurrent or metastatic malignancy in the chest. 3. Coronary artery disease. 4. Aortic valve calcifications. Correlate for echocardiographic evidence of aortic valve dysfunction. Aortic Atherosclerosis (ICD10-I70.0). Electronically Signed   By: Eddie Candle M.D.   On: 01/11/2021 11:35     ASSESSMENT/PLAN:  This is a very pleasant 80year old Caucasian male diagnosed with stage Ia non-small cell lung cancer. He is status post left upper lobectomy with lymph node dissection. He was diagnosed in December 2018. He is currently on observation.  The patient is feeling fine today with no concerning complaints. He had repeat CT scan of the chest performed recently. Dr. Julien Nordmann personally and independently reviewed the scans and discussed the results with the patient today. His scan showed no evidence for disease progression.  We recommended for him to  continue on observation with repeat CT scan of the chest without contrast in 1 year.   The patient was advised to call immediately if he has any concerning symptoms in the interval. The patient voices understanding of current disease status and treatment options and is in agreement with the current care plan. All questions were answered. The patient knows to call the clinic with any problems, questions or concerns. We can certainly see the patient much sooner if necessary    Orders Placed This Encounter  Procedures  . CT Chest Wo Contrast    Standing Status:   Future    Standing Expiration Date:   01/12/2022    Order Specific Question:   Preferred imaging location?    Answer:   [facility]  . CBC with Differential (Cancer Center Only)    Standing Status:   Future    Standing Expiration Date:   01/12/2022  . CMP ([facility] only)    Standing Status:   Future    Standing  Expiration Date:   01/12/2022      Nathaniel Sos Suraiya Dickerson, PA-C 01/12/21  ADDENDUM: Hematology/Oncology Attending: I had a face-to-face encounter with the patient today.  I reviewed his record, scan and recommended his care plan.  This is a very pleasant 80 years old white male with a stage Ia non-small cell lung cancer status post left upper lobectomy with lymph node dissection in December 2018.  The patient has been on observation since that time and he is feeling fine today with no concerning complaints. He had repeat CT scan of the chest performed recently.  I personally and independently reviewed the scan and discussed the results with the patient and his wife. His scan showed no concerning findings for disease recurrence or metastasis. I recommended for him to continue on observation with repeat CT scan of the chest in 1 year. The patient was advised to call immediately if he has any concerning symptoms in the interval.  Disclaimer: This note was dictated with voice recognition software. Similar sounding words can inadvertently be transcribed and may be missed upon review. Eilleen Kempf, MD 01/12/21

## 2021-01-06 NOTE — Telephone Encounter (Signed)
I called the patient and spoke to his wife. The patient has an appointment with [place] on 01/12/21. However, his restaging CT scan has not been scheduled at this time. I called his wife and gave her the number to radiology scheduling to schedule his CT scan. I encouraged her to schedule it before the 13th. If the scan is scheduled for after the 13th, I will likely need to move his appointment until after the scan is complete.

## 2021-01-10 ENCOUNTER — Encounter (HOSPITAL_COMMUNITY): Payer: Self-pay

## 2021-01-10 ENCOUNTER — Inpatient Hospital Stay: Payer: Medicare Other | Attending: Physician Assistant

## 2021-01-10 ENCOUNTER — Other Ambulatory Visit: Payer: Self-pay

## 2021-01-10 ENCOUNTER — Ambulatory Visit (HOSPITAL_COMMUNITY)
Admission: RE | Admit: 2021-01-10 | Discharge: 2021-01-10 | Disposition: A | Discharge status: Home / Self Care | Payer: Medicare Other | Source: Ambulatory Visit | Attending: Physician Assistant | Admitting: Physician Assistant

## 2021-01-10 DIAGNOSIS — Z85118 Personal history of other malignant neoplasm of bronchus and lung: Secondary | ICD-10-CM | POA: Diagnosis present

## 2021-01-10 DIAGNOSIS — C3412 Malignant neoplasm of upper lobe, left bronchus or lung: Secondary | ICD-10-CM

## 2021-01-10 DIAGNOSIS — Z902 Acquired absence of lung [part of]: Secondary | ICD-10-CM | POA: Insufficient documentation

## 2021-01-10 LAB — CBC WITH DIFFERENTIAL (CANCER CENTER ONLY)
Abs Immature Granulocytes: 0.01 10*3/uL (ref 0.00–0.07)
Basophils Absolute: 0.1 10*3/uL (ref 0.0–0.1)
Basophils Relative: 1 %
Eosinophils Absolute: 0.2 10*3/uL (ref 0.0–0.5)
Eosinophils Relative: 2 %
HCT: 41.5 % (ref 39.0–52.0)
Hemoglobin: 13.7 g/dL (ref 13.0–17.0)
Immature Granulocytes: 0 %
Lymphocytes Relative: 42 %
Lymphs Abs: 3.4 10*3/uL (ref 0.7–4.0)
MCH: 31.4 pg (ref 26.0–34.0)
MCHC: 33 g/dL (ref 30.0–36.0)
MCV: 95 fL (ref 80.0–100.0)
Monocytes Absolute: 0.7 10*3/uL (ref 0.1–1.0)
Monocytes Relative: 8 %
Neutro Abs: 3.8 10*3/uL (ref 1.7–7.7)
Neutrophils Relative %: 47 %
Platelet Count: 318 10*3/uL (ref 150–400)
RBC: 4.37 MIL/uL (ref 4.22–5.81)
RDW: 13.8 % (ref 11.5–15.5)
WBC Count: 8.1 10*3/uL (ref 4.0–10.5)
nRBC: 0 % (ref 0.0–0.2)

## 2021-01-10 LAB — CMP (CANCER CENTER ONLY)
ALT: 23 U/L (ref 0–44)
AST: 36 U/L (ref 15–41)
Albumin: 4.3 g/dL (ref 3.5–5.0)
Alkaline Phosphatase: 66 U/L (ref 38–126)
Anion gap: 10 (ref 5–15)
BUN: 22 mg/dL (ref 8–23)
CO2: 21 mmol/L — ABNORMAL LOW (ref 22–32)
Calcium: 8.8 mg/dL — ABNORMAL LOW (ref 8.9–10.3)
Chloride: 109 mmol/L (ref 98–111)
Creatinine: 1.69 mg/dL — ABNORMAL HIGH (ref 0.61–1.24)
GFR, Estimated: 41 mL/min — ABNORMAL LOW (ref >60)
Glucose, Bld: 99 mg/dL (ref 70–99)
Potassium: 4.4 mmol/L (ref 3.5–5.1)
Sodium: 140 mmol/L (ref 135–145)
Total Bilirubin: 1.5 mg/dL — ABNORMAL HIGH (ref 0.3–1.2)
Total Protein: 7.4 g/dL (ref 6.5–8.1)

## 2021-01-12 ENCOUNTER — Other Ambulatory Visit: Payer: Self-pay

## 2021-01-12 ENCOUNTER — Encounter: Payer: Self-pay | Admitting: Physician Assistant

## 2021-01-12 ENCOUNTER — Inpatient Hospital Stay: Payer: Medicare Other | Admitting: Physician Assistant

## 2021-01-12 VITALS — BP 152/55 | HR 50 | Temp 98.3°F | Resp 12 | Ht 70.0 in | Wt 194.0 lb

## 2021-01-12 DIAGNOSIS — C3412 Malignant neoplasm of upper lobe, left bronchus or lung: Secondary | ICD-10-CM | POA: Diagnosis not present

## 2021-01-12 DIAGNOSIS — Z85118 Personal history of other malignant neoplasm of bronchus and lung: Secondary | ICD-10-CM | POA: Diagnosis not present

## 2021-01-13 ENCOUNTER — Telehealth: Payer: Self-pay | Admitting: Physician Assistant

## 2021-01-13 NOTE — Telephone Encounter (Signed)
Scheduled per los. Called and left msg. Mailed printout  °

## 2021-04-08 ENCOUNTER — Other Ambulatory Visit: Payer: Self-pay | Admitting: Student

## 2021-04-08 DIAGNOSIS — I10 Essential (primary) hypertension: Secondary | ICD-10-CM

## 2021-04-19 ENCOUNTER — Other Ambulatory Visit: Payer: Medicare Other

## 2021-04-19 ENCOUNTER — Other Ambulatory Visit: Payer: Self-pay | Admitting: Cardiology

## 2021-04-19 DIAGNOSIS — F411 Generalized anxiety disorder: Secondary | ICD-10-CM

## 2021-04-19 DIAGNOSIS — F5101 Primary insomnia: Secondary | ICD-10-CM

## 2021-04-19 NOTE — Telephone Encounter (Signed)
I am good to renew this. When is his next appointment?

## 2021-04-19 NOTE — Telephone Encounter (Signed)
04/26/21 with Anderson Malta

## 2021-04-19 NOTE — Telephone Encounter (Signed)
Refill request

## 2021-04-20 ENCOUNTER — Other Ambulatory Visit: Payer: Self-pay | Admitting: Cardiology

## 2021-04-20 ENCOUNTER — Other Ambulatory Visit: Payer: Self-pay

## 2021-04-20 ENCOUNTER — Ambulatory Visit: Payer: Medicare Other

## 2021-04-20 DIAGNOSIS — F5101 Primary insomnia: Secondary | ICD-10-CM

## 2021-04-20 DIAGNOSIS — F411 Generalized anxiety disorder: Secondary | ICD-10-CM

## 2021-04-20 DIAGNOSIS — I6523 Occlusion and stenosis of bilateral carotid arteries: Secondary | ICD-10-CM

## 2021-04-20 MED ORDER — ALPRAZOLAM 0.5 MG PO TABS: ORAL_TABLET | ORAL | 3 refills | Status: DC

## 2021-04-21 ENCOUNTER — Other Ambulatory Visit: Payer: Self-pay | Admitting: Cardiology

## 2021-04-21 ENCOUNTER — Inpatient Hospital Stay (HOSPITAL_COMMUNITY): Admission: RE | Admit: 2021-04-21 | Payer: Medicare Other | Source: Ambulatory Visit

## 2021-04-21 DIAGNOSIS — F411 Generalized anxiety disorder: Secondary | ICD-10-CM

## 2021-04-21 DIAGNOSIS — F5101 Primary insomnia: Secondary | ICD-10-CM

## 2021-04-21 MED ORDER — ALPRAZOLAM 0.5 MG PO TABS: ORAL_TABLET | ORAL | 3 refills | Status: DC

## 2021-04-22 ENCOUNTER — Ambulatory Visit (HOSPITAL_COMMUNITY)
Admission: RE | Admit: 2021-04-22 | Discharge: 2021-04-22 | Disposition: A | Discharge status: Home / Self Care | Payer: Medicare Other | Source: Ambulatory Visit | Attending: Cardiology | Admitting: Cardiology

## 2021-04-22 ENCOUNTER — Other Ambulatory Visit: Payer: Self-pay

## 2021-04-22 DIAGNOSIS — I6523 Occlusion and stenosis of bilateral carotid arteries: Secondary | ICD-10-CM | POA: Diagnosis present

## 2021-04-25 NOTE — Progress Notes (Signed)
Primary Physician/Referring:  [name], MD  Patient ID: Nathaniel Long, male    DOB: Oct 29, 1940, 80 y.o.   MRN: [id number]  Chief Complaint  Patient presents with   Follow-up    6 month   hld   carotid dz   HPI:    Nathaniel Long  is a 80 y.o. 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 Angiograms could not explain his right hip pain and right leg pain and was recommended evaluation for pseudo-claudication. Patient with history of upper GI bleed in 06/2020.   Notably patient is hard of hearing despite use of hearing aides. Today patient forgot his hearing aides, therefore had to using writing pad for a majority of today's visit to relay information to the patient.   Patient presents for 6 month follow up of MR, carotid disease, and hyperlipidemia. At last visit added aspirin 81 mg daily given progression of carotid stenosis.  However patient has not been consistently taking aspirin on a daily basis.  He states overall he is feeling well and remains relatively asymptomatic.  He continues to work on cars and fish on a regular basis without issue.  Denies claudication, chest pain, dyspnea, swelling, palpitations, syncope, near syncope.  However he does report occasional episodes of dizziness while working in the yard.  Past Medical History:  Diagnosis Date   Anxiety    Cancer ([place])    lung   Carotid artery stenosis    Cataract    Chronic lower back pain    CKD (chronic kidney disease)    Complication of anesthesia    "can't pee when I come out of OR" (07/18/2017)   Contact with chainsaw as cause of  accidental injury 07/16/2017   sternal fracture with bilateral anterior fifth and sixth rib fractures /notes 07/16/2017   COPD (chronic obstructive pulmonary disease) ([facility])    Dyspnea    GERD (gastroesophageal reflux disease)    Hard of hearing    High cholesterol    Hypertension    Peripheral vascular disease ([place])    Thoracic ascending aortic aneurysm [facility])    Past Surgical History:  Procedure Laterality Date   ABDOMINAL AORTOGRAM W/LOWER EXTREMITY N/A 05/01/2017   Procedure: Abdominal Aortogram w/Lower Extremity;  Surgeon: Adrian Prows, MD;  Location: [place] [facility];  Service: Cardiovascular;  Laterality: N/A;   BACK SURGERY     FINGER REPLANTATION Left    "ring finger"   HEMORRHOID SURGERY     POSTERIOR LUMBAR FUSION  1986   RENAL ANGIOGRAPHY Bilateral 12/18/2017   Procedure: RENAL ANGIOGRAPHY;  Surgeon: Nigel Mormon, MD;  Location: [place] [facility];  Service: Cardiovascular;  Laterality: Bilateral;   VIDEO ASSISTED THORACOSCOPY (VATS)/ LOBECTOMY Left 09/19/2017   Procedure: LEFT VIDEO ASSISTED THORACOSCOPY (VATS)/ LEFT UPPER LOBECTOMY, LYMPH NODE DISECTION;  Surgeon: Melrose Nakayama, MD;  Location: [place];  Service: Thoracic;  Laterality: Left;   VIDEO BRONCHOSCOPY WITH ENDOBRONCHIAL NAVIGATION N/A 09/05/2017   Procedure: VIDEO BRONCHOSCOPY WITH ENDOBRONCHIAL NAVIGATION;  Surgeon: Collene Gobble, MD;  Location: [place];  Service: Thoracic;  Laterality: N/A;   VIDEO BRONCHOSCOPY WITH ENDOBRONCHIAL ULTRASOUND N/A 09/05/2017  Procedure: VIDEO BRONCHOSCOPY WITH ENDOBRONCHIAL ULTRASOUND;  Surgeon: Collene Gobble, MD;  Location: [facility];  Service: Thoracic;  Laterality: N/A;   Family History  Problem Relation Age of Onset   Colon polyps Brother    Colon cancer Brother    Dementia Sister     Social History   Tobacco Use   Smoking status: Former    Packs/day: 1.00    Years: 33.00    Pack years: 33.00    Types: Cigarettes    Quit date: 04/17/2017    Years since  quitting: 4.0   Smokeless tobacco: Never   Tobacco comments:    May, 2018  Substance Use Topics   Alcohol use: Not Currently    Comment: last drink 1995   Marital Status: Married   ROS  Review of Systems  Constitutional: Negative for malaise/fatigue and weight gain.  HENT:  Positive for hearing loss.   Cardiovascular:  Negative for chest pain, claudication, dyspnea on exertion, leg swelling, near-syncope, orthopnea, palpitations, paroxysmal nocturnal dyspnea and syncope.  Respiratory:  Negative for shortness of breath.   Hematologic/Lymphatic: Does not bruise/bleed easily.  Musculoskeletal:  Positive for back pain.  Gastrointestinal:  Negative for melena.  Neurological:  Positive for dizziness (occasional). Negative for weakness.   Objective  Blood pressure (!) 120/53, pulse (!) 49, temperature 97.6 F (36.4 C), temperature source Temporal, resp. rate 17, height '5\' 10"'  (1.778 m), weight 183 lb 6.4 oz (83.2 kg), SpO2 97 %.  Vitals with BMI 04/26/2021 01/12/2021 10/26/2020  Height '5\' 10"'  '5\' 10"'  '5\' 11"'   Weight 183 lbs 6 oz 194 lbs 198 lbs  BMI 26.32 51.76 16.07  Systolic 371 062 694  Diastolic 53 55 85  Pulse 49 50 55      Physical Exam Vitals reviewed.  Cardiovascular:     Rate and Rhythm: Regular rhythm. Bradycardia present.     Pulses: Intact distal pulses.          Carotid pulses are 2+ on the right side with bruit and 2+ on the left side with bruit.      Popliteal pulses are 2+ on the right side and 2+ on the left side.       Dorsalis pedis pulses are 2+ on the right side and 0 on the left side.       Posterior tibial pulses are 0 on the right side and 2+ on the left side.     Heart sounds: S1 normal and S2 normal. Murmur heard.  Early systolic murmur is present with a grade of 3/6 at the upper right sternal border.  Blowing midsystolic murmur of grade 2/6 is also present at the apex.    No gallop.  Pulmonary:     Effort: Pulmonary effort is normal. Prolonged expiration  present. No respiratory distress.     Breath sounds: No wheezing, rhonchi or rales.     Comments: Thorocotomy for lobectomy.  Musculoskeletal:     Right lower leg: No edema.     Left lower leg: No edema.  Neurological:     Mental Status: He is alert.  Vitals reviewed. Laboratory examination:   Recent Labs    06/17/20 1852 06/18/20 1058 06/19/20 0816 01/10/21 1109  NA 135 135 139 140  K 4.3 4.3 4.0 4.4  CL 102 106 109 109  CO2 21* 19* 21* 21*  GLUCOSE 140* 116* 104* 99  BUN 33* 28* 26* 22  CREATININE 1.54* 1.56* 1.53* 1.69*  CALCIUM 8.7* 8.2* 8.5* 8.8*  GFRNONAA 43* 42* 43* 41*  GFRAA 49* 49* 50*  --    CrCl cannot be calculated (Patient's most recent lab result is older than the maximum 21 days allowed.).  CMP Latest Ref Rng & Units 01/10/2021 06/19/2020 06/18/2020  Glucose 70 - 99 mg/dL 99 104(H) 116(H)  BUN 8 - 23 mg/dL 22 26(H) 28(H)  Creatinine 0.61 - 1.24 mg/dL 1.69(H) 1.53(H) 1.56(H)  Sodium 135 - 145 mmol/L 140 139 135  Potassium 3.5 - 5.1 mmol/L 4.4 4.0 4.3  Chloride 98 - 111 mmol/L 109 109 106  CO2 22 - 32 mmol/L 21(L) 21(L) 19(L)  Calcium 8.9 - 10.3 mg/dL 8.8(L) 8.5(L) 8.2(L)  Total Protein 6.5 - 8.1 g/dL 7.4 6.4(L) 6.7  Total Bilirubin 0.3 - 1.2 mg/dL 1.5(H) 1.2 0.8  Alkaline Phos 38 - 126 U/L 66 54 54  AST 15 - 41 U/L 36 33 40  ALT 0 - 44 U/L 23 32 36   CBC Latest Ref Rng & Units 01/10/2021 07/21/2020 06/19/2020  WBC 4.0 - 10.5 K/uL 8.1 8.9 11.3(H)  Hemoglobin 13.0 - 17.0 g/dL 13.7 12.0(L) 11.1(L)  Hematocrit 39.0 - 52.0 % 41.5 35.3(L) 34.0(L)  Platelets 150 - 400 K/uL 318 348.0 329    Lipid Panel Recent Labs    07/26/20 1406  CHOL 114  TRIG 123  LDLCALC 56  HDL 36*    HEMOGLOBIN A1C Lab Results  Component Value Date   HGBA1C 6.0 02/21/2012   TSH No results for input(s): TSH in the last 8760 hours.  External labs:  10/12/2020: Total cholesterol 117, triglycerides 79, HDL 41, LDL 60 Hemoglobin 13.4, hematocrit 41.0, MCV 94.4, platelets  317 Sodium 138, potassium 4.5, glucose 102, BUN 22, creatinine 1.51, GFR 43 A1c 6.7%  07/02/2020:  Hemoglobin 11.1, hematocrit 33.2, RBC 3.46, platelet 160, CBC otherwise normal Sodium 137 potassium 4.9 BUN 15 creatinine 1.61, CMP otherwise normal EGFR 40  10/07/2015: HDL 35, LDL 99, total cholesterol 167, triglycerides 166  Allergies   Allergies  Allergen Reactions   Gabapentin Other (See Comments)    dizziness   Statins Other (See Comments)    Muscle weakness      Medications Prior to Visit:   Outpatient Medications Prior to Visit  Medication Sig Dispense Refill   albuterol (PROVENTIL) (2.5 MG/3ML) 0.083% nebulizer solution USE 1 VIAL IN NEBULIZER EVERY 6 HOURS - for wheezing or shortness of breath 75 mL 11   ALPRAZolam (XANAX) 0.5 MG tablet TAKE 1 TABLET BY MOUTH EVERYDAY AT BEDTIME 30 tablet 3   benzonatate (TESSALON) 200 MG capsule Take 200 mg by mouth 3 (three) times daily as needed.     calcium carbonate (TUMS - DOSED IN MG ELEMENTAL CALCIUM) 500 MG chewable tablet Chew 1 tablet by mouth daily as needed for indigestion or heartburn.     ezetimibe (ZETIA) 10 MG tablet Take 5 mg by mouth daily.  3   fish oil-omega-3 fatty acids 1000 MG capsule Take 1 g by mouth every evening.     Multiple Vitamins-Minerals (CENTRUM PO) Take 1 tablet by mouth every evening.     pantoprazole (PROTONIX) 40 MG tablet TAKE 1 TABLET BY MOUTH ONCE DAILY 1/2 HOUR BEFORE A  MEAL 90 tablet 3   Pitavastatin Calcium (LIVALO) 4 MG TABS Take 0.5 tablets (2 mg total) by mouth daily. 45 tablet 3   polyethylene glycol (MIRALAX / GLYCOLAX) 17 g packet Take 17 g by mouth daily. 14 each 0   Tiotropium Bromide-Olodaterol (STIOLTO RESPIMAT) 2.5-2.5 MCG/ACT AERS  Inhale 2 puffs into the lungs daily. 4 g 0   amLODipine (NORVASC) 10 MG tablet TAKE 1/2 TABLET BY MOUTH DAILY (Patient taking differently: Take 10 mg by mouth daily.) 45 tablet 3   metoprolol succinate (TOPROL-XL) 25 MG 24 hr tablet TAKE 1 TABLET BY MOUTH  EVERY DAY 90 tablet 1   Facility-Administered Medications Prior to Visit  Medication Dose Route Frequency Provider Last Rate Last Admin   0.9 %  sodium chloride infusion  250 mL Intravenous PRN Gold, Wayne E, PA-C       levalbuterol (XOPENEX) nebulizer solution 0.63 mg  0.63 mg Nebulization Q6H PRN Gaye Pollack, MD   0.63 mg at 09/29/17 1646   sodium chloride flush (NS) 0.9 % injection 3 mL  3 mL Intravenous Q12H Gold, Wayne E, PA-C       sodium chloride flush (NS) 0.9 % injection 3 mL  3 mL Intravenous Q12H Gold, Wayne E, PA-C       sodium chloride flush (NS) 0.9 % injection 3 mL  3 mL Intravenous PRN Jadene Pierini E, PA-C         Final Medications at End of Visit    Current Meds  Medication Sig   albuterol (PROVENTIL) (2.5 MG/3ML) 0.083% nebulizer solution USE 1 VIAL IN NEBULIZER EVERY 6 HOURS - for wheezing or shortness of breath   ALPRAZolam (XANAX) 0.5 MG tablet TAKE 1 TABLET BY MOUTH EVERYDAY AT BEDTIME   aspirin EC 81 MG tablet Take 1 tablet (81 mg total) by mouth daily. Swallow whole.   benzonatate (TESSALON) 200 MG capsule Take 200 mg by mouth 3 (three) times daily as needed.   calcium carbonate (TUMS - DOSED IN MG ELEMENTAL CALCIUM) 500 MG chewable tablet Chew 1 tablet by mouth daily as needed for indigestion or heartburn.   ezetimibe (ZETIA) 10 MG tablet Take 5 mg by mouth daily.   fish oil-omega-3 fatty acids 1000 MG capsule Take 1 g by mouth every evening.   Multiple Vitamins-Minerals (CENTRUM PO) Take 1 tablet by mouth every evening.   pantoprazole (PROTONIX) 40 MG tablet TAKE 1 TABLET BY MOUTH ONCE DAILY 1/2 HOUR BEFORE A  MEAL   Pitavastatin Calcium (LIVALO) 4 MG TABS Take 0.5 tablets (2 mg total) by mouth daily.   polyethylene glycol (MIRALAX / GLYCOLAX) 17 g packet Take 17 g by mouth daily.   Tiotropium Bromide-Olodaterol (STIOLTO RESPIMAT) 2.5-2.5 MCG/ACT AERS Inhale 2 puffs into the lungs daily.   [DISCONTINUED] amLODipine (NORVASC) 10 MG tablet TAKE 1/2 TABLET BY  MOUTH DAILY (Patient taking differently: Take 10 mg by mouth daily.)   [DISCONTINUED] metoprolol succinate (TOPROL-XL) 25 MG 24 hr tablet TAKE 1 TABLET BY MOUTH EVERY DAY   Radiology:   No results found.  Cardiac Studies:  Carotid artery duplex 04/22/2021:  Right Carotid: Velocities in the right ICA are consistent with a 40-59% stenosis. The ECA appears >50% stenosed.  Left Carotid: Velocities in the left ICA are consistent with a 40-59% stenosis. Hemodynamically significant plaque >50% visualized in the  CCA. The ECA appears >50% stenosed.  Vertebrals:  Right vertebral artery demonstrates antegrade flow. Left vertebral artery demonstrates bidirectional flow.  Subclavians: Bilateral subclavian arteries were stenotic.   Carotid artery duplex 08/06/2020:  Stenosis in the right internal carotid artery (16-49%). Stenosis in the right common carotid artery (<50%). Stenosis in the right external carotid artery (<50%).  Stenosis in the left internal carotid artery (50-69%). Stenosis in the left common carotid artery (>50%). Stenosis in the  left external carotid artery (<50%).  Antegrade right vertebral artery flow. Antegrade left vertebral artery flow.  Compared to 09/11/2017, there is progression of disease bilaterally, especially in common carotid arteries with homogenous plaque with high risk for embolic complications. Follow up in 6 months is appropriate if clinically indicated.   PCV ECHOCARDIOGRAM COMPLETE 41/32/4401 Normal LV systolic function with visual EF 55-60%. Left ventricle cavity is normal in size. Moderate concentric hypertrophy of the left ventricle. Normal global wall motion. Left atrial cavity is moderately dilated at 4.6 cm. Trileaflet aortic valve. Mild aortic stenosis. Trace aortic regurgitation. Moderate aortic valve leaflet calcification. Mildly restricted aortic valve leaflets. Peak PG of 19.2 and mean PG of 10.3 mm Hg and Calculated aortic valve area 1.7 cm. Normal mitral  valve thickness.  Mild prolapse of the mitral valve leaflets. Moderate posteriorly directed  (Grade III) mitral regurgitation. Mild tricuspid regurgitation. No evidence of pulmonary hypertension. RVSP measures 30 mmHg. Compared to 02/24/2016, MVP and MR new.   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 Previously noted 90% stenosis by angiogram on 05/01/2017 not noted.   Nuclear stress test   08/31/2017: 1. Pharmacologic stress testing was performed with intravenous administration of .4 mg of Lexiscan. 2. Stress EKG is non diagnostic for ischemia as it is a pharmacologic stress. 3. Gated SPECT images reveal normal myocardial thickening and wall motion. The left ventricular ejection fraction was calculated to be 60%. SPECT images demonstrate small perfusion abnormality of mild intensity in the basal inferior and mid inferior myocardial wall(s) on the stress images with mild reversibility on rest images. This could represent diaphragmatic attenuation. 4. This is a low risk study.   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 Right PT and left AT occluded below knee, but otherwise mild disease.   Echocardiogram  [02/23/2016]: Left ventricle cavity is normal in size. Mild concentric hypertrophy of the left ventricle. Normal global wall motion. Doppler evidence of grade I (impaired) diastolic dysfunction. LVEF visully estimated at 55-60%. Mild calcification of the aortic valve annulus. Mildly restricted aortic valve leaflets. Mild aortic valve leaflet calcification. No aortic valve regurgitation noted. Trace aortic valve stenosis. Aortic valve peak pressure gradient of 24 and mean gradient of 9 mmHg, calculated aortic valve area 2.32 cm. Mild tricuspid regurgitation. No evidence of pulmonary hypertension.  EKG:  04/26/2021: Sinus bradycardia rate of 44 bpm.  Normal axis.  Left  ventricular hypertrophy.  Nonspecific T wave abnormality.  Compared to EKG 08/12/2019, no significant change.  However compared to 07/26/2020, no PRWP noted.  07/26/2020: Sinus bradycardia at a rate of 52 bpm. Normal axis.  Left ventricular hypertrophy.  Nonspecific T wave abnormality.  Poor R wave progression, cannot exclude anterior infarct old.  Compared to EKG 08/12/2019, PRWP new.    Assessment     ICD-10-CM   1. Carotid stenosis, bilateral  I65.23     2. Primary hypertension  I10 EKG 12-Lead    3. Nonrheumatic mitral valve regurgitation  I34.0     4. Hyperlipidemia, group A  E78.00        Medications Discontinued During This Encounter  Medication Reason   metoprolol succinate (TOPROL-XL) 25 MG 24 hr tablet Side effect (s)   amLODipine (NORVASC) 10 MG tablet Reorder    Meds ordered this encounter  Medications   aspirin EC 81 MG tablet    Sig: Take 1 tablet (81 mg total) by mouth daily. Swallow whole.  Dispense:  90 tablet    Refill:  3   amLODipine (NORVASC) 10 MG tablet    Sig: Take 1 tablet (10 mg total) by mouth daily.    Dispense:  90 tablet    Refill:  3    Recommendations:   BREYER TEJERA is a 80 y.o. 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 Angiograms could not explain his right hip pain and right leg pain and was recommended evaluation for pseudo-claudication. Patient with history of upper GI bleed in 06/2020.   Patient presents for 6 month follow up of MR, carotid disease, and hyperlipidemia. At last visit added aspirin 81 mg daily given progression of carotid stenosis.  EKG  today reveals marked sinus bradycardia, which is likely contributing to patient's episodes of dizziness while working in his yard.  We will therefore discontinue metoprolol and increase amlodipine from 5 mg to 10 mg daily.  Encourage patient to monitor heart rate and blood pressure on a regular basis at home and bring with him written recordings to his next office visit.  We will plan to follow-up in 2 weeks with repeat echocardiogram.  Reviewed and discussed with patient regarding results of carotid artery duplex, which revealed progression of right coronary artery disease, details above.  Advised patient regarding the importance of daily aspirin 81 mg, patient agrees to focus on improving medication compliance.  We will continue atorvastatin and Zetia most recent LDL in January 2022 was 60, lipids well controlled.  We will continue close carotid artery surveillance, will discuss further at close follow up appointment.  We will also continue to monitor mitral valve prolapse and mitral regurgitation.  Follow-up in 2 weeks, sooner if needed, for marked sinus bradycardia and hypertension with repeat EKG.   Alethia Berthold, PA-C 04/26/2021, 12:01 PM Office: [phone]

## 2021-04-26 ENCOUNTER — Encounter: Payer: Self-pay | Admitting: Student

## 2021-04-26 ENCOUNTER — Ambulatory Visit: Payer: Medicare Other | Admitting: Student

## 2021-04-26 ENCOUNTER — Other Ambulatory Visit: Payer: Self-pay

## 2021-04-26 VITALS — BP 120/53 | HR 49 | Temp 97.6°F | Resp 17 | Ht 70.0 in | Wt 183.4 lb

## 2021-04-26 DIAGNOSIS — E7800 Pure hypercholesterolemia, unspecified: Secondary | ICD-10-CM

## 2021-04-26 DIAGNOSIS — I6523 Occlusion and stenosis of bilateral carotid arteries: Secondary | ICD-10-CM

## 2021-04-26 DIAGNOSIS — I340 Nonrheumatic mitral (valve) insufficiency: Secondary | ICD-10-CM

## 2021-04-26 DIAGNOSIS — I10 Essential (primary) hypertension: Secondary | ICD-10-CM

## 2021-04-26 MED ORDER — ASPIRIN EC 81 MG PO TBEC: 81.0000 mg | DELAYED_RELEASE_TABLET | Freq: Every day | ORAL | 3 refills | Status: DC

## 2021-04-26 MED ORDER — AMLODIPINE BESYLATE 10 MG PO TABS: 10.0000 mg | ORAL_TABLET | Freq: Every day | ORAL | 3 refills | Status: DC

## 2021-05-16 ENCOUNTER — Encounter: Payer: Self-pay | Admitting: Student

## 2021-05-16 ENCOUNTER — Other Ambulatory Visit: Payer: Self-pay

## 2021-05-16 ENCOUNTER — Ambulatory Visit
Admission: RE | Admit: 2021-05-16 | Discharge: 2021-05-16 | Disposition: A | Discharge status: Home / Self Care | Payer: Medicare Other | Source: Ambulatory Visit | Attending: Student | Admitting: Student

## 2021-05-16 ENCOUNTER — Other Ambulatory Visit: Payer: Self-pay | Admitting: Cardiology

## 2021-05-16 ENCOUNTER — Telehealth: Payer: Self-pay

## 2021-05-16 ENCOUNTER — Ambulatory Visit: Payer: Medicare Other | Admitting: Student

## 2021-05-16 VITALS — BP 114/65 | HR 51 | Temp 97.7°F | Resp 17 | Ht 70.0 in | Wt 180.6 lb

## 2021-05-16 DIAGNOSIS — R0602 Shortness of breath: Secondary | ICD-10-CM

## 2021-05-16 DIAGNOSIS — I10 Essential (primary) hypertension: Secondary | ICD-10-CM

## 2021-05-16 DIAGNOSIS — R001 Bradycardia, unspecified: Secondary | ICD-10-CM

## 2021-05-16 DIAGNOSIS — R059 Cough, unspecified: Secondary | ICD-10-CM

## 2021-05-16 DIAGNOSIS — I6523 Occlusion and stenosis of bilateral carotid arteries: Secondary | ICD-10-CM

## 2021-05-16 NOTE — Progress Notes (Signed)
Primary Physician/Referring:  [name], MD  Patient ID: Nathaniel Long, male    DOB: December 26, 1940, 80 y.o.   MRN: [id number]  Chief Complaint  Patient presents with   [facility]   Hypertension   REPEAT EKG   HPI:    Nathaniel Long  is a 80 y.o. 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 Angiograms could not explain his right hip pain and right leg pain and was recommended evaluation for pseudo-claudication. Patient with history of upper GI bleed in 06/2020.   Notably patient is hard of hearing despite use of hearing aides.  Patient presents for 2-week follow-up of bradycardia and hypertension.  At last visit discontinued metoprolol and increased amlodipine from 5 to 10 mg daily.  Also at last visit patient was struggling with medication compliance, and had not been taking aspirin.  Patient's primary concern is that he has developed cough and mild shortness of breath over the last few days.  Denies chest pain, palpitations, syncope, near syncope, claudication.  He does continue to have occasional brief episodes of dizziness particularly while working in the yard going from bending to standing.  He has not fallen.  Past Medical History:  Diagnosis Date   Anxiety    Cancer ([place])    lung   Carotid artery stenosis    Cataract    Chronic lower back pain    CKD (chronic kidney disease)    Complication of anesthesia    "can't pee when I come out of OR" (07/18/2017)   Contact with chainsaw as cause of accidental injury 07/16/2017   sternal fracture with bilateral anterior fifth and sixth rib fractures /notes  07/16/2017   COPD (chronic obstructive pulmonary disease) (HCC)    Dyspnea    GERD (gastroesophageal reflux disease)    Hard of hearing    High cholesterol    Hypertension    Peripheral vascular disease ([place])    Thoracic ascending aortic aneurysm [facility])    Past Surgical History:  Procedure Laterality Date   ABDOMINAL AORTOGRAM W/LOWER EXTREMITY N/A 05/01/2017   Procedure: Abdominal Aortogram w/Lower Extremity;  Surgeon: Adrian Prows, MD;  Location: [place] [facility];  Service: Cardiovascular;  Laterality: N/A;   BACK SURGERY     FINGER REPLANTATION Left    "ring finger"   HEMORRHOID SURGERY     POSTERIOR LUMBAR FUSION  1986   RENAL ANGIOGRAPHY Bilateral 12/18/2017   Procedure: RENAL ANGIOGRAPHY;  Surgeon: Nigel Mormon, MD;  Location: [place] [facility];  Service: Cardiovascular;  Laterality: Bilateral;   VIDEO ASSISTED THORACOSCOPY (VATS)/ LOBECTOMY Left 09/19/2017   Procedure: LEFT VIDEO ASSISTED THORACOSCOPY (VATS)/ LEFT UPPER LOBECTOMY, LYMPH NODE DISECTION;  Surgeon: Melrose Nakayama, MD;  Location: [place];  Service: Thoracic;  Laterality: Left;   VIDEO BRONCHOSCOPY WITH ENDOBRONCHIAL NAVIGATION N/A 09/05/2017   Procedure: VIDEO BRONCHOSCOPY WITH ENDOBRONCHIAL NAVIGATION;  Surgeon: Collene Gobble, MD;  Location: [place];  Service: Thoracic;  Laterality: N/A;   VIDEO BRONCHOSCOPY WITH ENDOBRONCHIAL ULTRASOUND N/A 09/05/2017   Procedure: VIDEO BRONCHOSCOPY WITH ENDOBRONCHIAL ULTRASOUND;  Surgeon: Collene Gobble, MD;  Location: [place];  Service: Thoracic;  Laterality: N/A;  Family History  Problem Relation Age of Onset   Heart disease Mother    Liver disease Father    Dementia Sister    Colon polyps Brother    Colon cancer Brother     Social History   Tobacco Use   Smoking status: Former    Packs/day: 1.00    Years: 33.00    Pack years: 33.00    Types: Cigarettes    Quit date: 04/17/2017    Years since quitting: 4.0   Smokeless tobacco: Never   Tobacco comments:     May, 2018  Substance Use Topics   Alcohol use: Not Currently    Comment: last drink 1995   Marital Status: Married   ROS  Review of Systems  Constitutional: Negative for malaise/fatigue and weight gain.  Cardiovascular:  Negative for chest pain, claudication, leg swelling, near-syncope, orthopnea, palpitations, paroxysmal nocturnal dyspnea and syncope.  Respiratory:  Positive for cough and shortness of breath.   Neurological:  Negative for dizziness.   Objective  Blood pressure 114/65, pulse (!) 51, temperature 97.7 F (36.5 C), temperature source Temporal, resp. rate 17, height '5\' 10"'  (1.778 m), weight 180 lb 9.6 oz (81.9 kg), SpO2 97 %.  Vitals with BMI 05/16/2021 05/16/2021 04/26/2021  Height - '5\' 10"'  '5\' 10"'   Weight - 180 lbs 10 oz 183 lbs 6 oz  BMI - 16.10 96.04  Systolic 540 981 191  Diastolic 65 59 53  Pulse 51 53 49      Physical Exam Vitals reviewed.  Cardiovascular:     Rate and Rhythm: Regular rhythm. Bradycardia present.     Pulses: Intact distal pulses.     Heart sounds: S1 normal and S2 normal. Murmur heard.    No gallop.  Pulmonary:     Effort: Pulmonary effort is normal. No respiratory distress.     Breath sounds: Rhonchi (bilareral throughout) present. No rales.     Comments: Expiratory wheezes  Musculoskeletal:     Right lower leg: No edema.     Left lower leg: No edema.  Neurological:     Mental Status: He is alert.   Laboratory examination:   Recent Labs    06/17/20 1852 06/18/20 1058 06/19/20 0816 01/10/21 1109  NA 135 135 139 140  K 4.3 4.3 4.0 4.4  CL 102 106 109 109  CO2 21* 19* 21* 21*  GLUCOSE 140* 116* 104* 99  BUN 33* 28* 26* 22  CREATININE 1.54* 1.56* 1.53* 1.69*  CALCIUM 8.7* 8.2* 8.5* 8.8*  GFRNONAA 43* 42* 43* 41*  GFRAA 49* 49* 50*  --    CrCl cannot be calculated (Patient's most recent lab result is older than the maximum 21 days allowed.).  CMP Latest Ref Rng & Units 01/10/2021 06/19/2020 06/18/2020  Glucose 70 - 99 mg/dL  99 104(H) 116(H)  BUN 8 - 23 mg/dL 22 26(H) 28(H)  Creatinine 0.61 - 1.24 mg/dL 1.69(H) 1.53(H) 1.56(H)  Sodium 135 - 145 mmol/L 140 139 135  Potassium 3.5 - 5.1 mmol/L 4.4 4.0 4.3  Chloride 98 - 111 mmol/L 109 109 106  CO2 22 - 32 mmol/L 21(L) 21(L) 19(L)  Calcium 8.9 - 10.3 mg/dL 8.8(L) 8.5(L) 8.2(L)  Total Protein 6.5 - 8.1 g/dL 7.4 6.4(L) 6.7  Total Bilirubin 0.3 - 1.2 mg/dL 1.5(H) 1.2 0.8  Alkaline Phos 38 - 126 U/L 66 54 54  AST 15 - 41 U/L 36 33 40  ALT 0 - 44 U/L 23 32 36   CBC Latest  Ref Rng & Units 01/10/2021 07/21/2020 06/19/2020  WBC 4.0 - 10.5 K/uL 8.1 8.9 11.3(H)  Hemoglobin 13.0 - 17.0 g/dL 13.7 12.0(L) 11.1(L)  Hematocrit 39.0 - 52.0 % 41.5 35.3(L) 34.0(L)  Platelets 150 - 400 K/uL 318 348.0 329    Lipid Panel Recent Labs    07/26/20 1406  CHOL 114  TRIG 123  LDLCALC 56  HDL 36*    HEMOGLOBIN A1C Lab Results  Component Value Date   HGBA1C 6.0 02/21/2012   TSH No results for input(s): TSH in the last 8760 hours.  External labs:  10/12/2020: Total cholesterol 117, triglycerides 79, HDL 41, LDL 60 Hemoglobin 13.4, hematocrit 41.0, MCV 94.4, platelets 317 Sodium 138, potassium 4.5, glucose 102, BUN 22, creatinine 1.51, GFR 43 A1c 6.7%  07/02/2020:  Hemoglobin 11.1, hematocrit 33.2, RBC 3.46, platelet 160, CBC otherwise normal Sodium 137 potassium 4.9 BUN 15 creatinine 1.61, CMP otherwise normal EGFR 40  10/07/2015: HDL 35, LDL 99, total cholesterol 167, triglycerides 166  Allergies   Allergies  Allergen Reactions   Gabapentin Other (See Comments)    dizziness   Statins Other (See Comments)    Muscle weakness    Medications Prior to Visit:   Outpatient Medications Prior to Visit  Medication Sig Dispense Refill   ALPRAZolam (XANAX) 0.5 MG tablet TAKE 1 TABLET BY MOUTH EVERYDAY AT BEDTIME 30 tablet 3   amLODipine (NORVASC) 10 MG tablet TAKE 1/2 TABLET BY MOUTH EVERY DAY 45 tablet 3   ezetimibe (ZETIA) 10 MG tablet Take 5 mg by mouth daily.  3    fish oil-omega-3 fatty acids 1000 MG capsule Take 1 g by mouth every evening.     Multiple Vitamins-Minerals (CENTRUM PO) Take 1 tablet by mouth every evening.     pantoprazole (PROTONIX) 40 MG tablet TAKE 1 TABLET BY MOUTH ONCE DAILY 1/2 HOUR BEFORE A  MEAL 90 tablet 3   Pitavastatin Calcium (LIVALO) 4 MG TABS Take 0.5 tablets (2 mg total) by mouth daily. 45 tablet 3   Tiotropium Bromide-Olodaterol (STIOLTO RESPIMAT) 2.5-2.5 MCG/ACT AERS Inhale 2 puffs into the lungs daily. 4 g 0   aspirin EC 81 MG tablet Take 1 tablet (81 mg total) by mouth daily. Swallow whole. (Patient not taking: Reported on 05/16/2021) 90 tablet 3   calcium carbonate (TUMS - DOSED IN MG ELEMENTAL CALCIUM) 500 MG chewable tablet Chew 1 tablet by mouth daily as needed for indigestion or heartburn. (Patient not taking: Reported on 05/19/2021)     polyethylene glycol (MIRALAX / GLYCOLAX) 17 g packet Take 17 g by mouth daily. (Patient not taking: Reported on 05/19/2021) 14 each 0   albuterol (PROVENTIL) (2.5 MG/3ML) 0.083% nebulizer solution USE 1 VIAL IN NEBULIZER EVERY 6 HOURS - for wheezing or shortness of breath 75 mL 11   benzonatate (TESSALON) 200 MG capsule Take 200 mg by mouth 3 (three) times daily as needed. (Patient not taking: Reported on 05/16/2021)     Facility-Administered Medications Prior to Visit  Medication Dose Route Frequency Provider Last Rate Last Admin   0.9 %  sodium chloride infusion  250 mL Intravenous PRN Gold, Wayne E, PA-C       levalbuterol (XOPENEX) nebulizer solution 0.63 mg  0.63 mg Nebulization Q6H PRN Gaye Pollack, MD   0.63 mg at 09/29/17 1646   sodium chloride flush (NS) 0.9 % injection 3 mL  3 mL Intravenous Q12H Gold, Wayne E, PA-C       sodium chloride flush (NS) 0.9 % injection  3 mL  3 mL Intravenous Q12H Gold, Wayne E, PA-C       sodium chloride flush (NS) 0.9 % injection 3 mL  3 mL Intravenous PRN [name], PA-C       Final Medications at End of Visit    Current Meds  Medication  Sig   ALPRAZolam (XANAX) 0.5 MG tablet TAKE 1 TABLET BY MOUTH EVERYDAY AT BEDTIME   amLODipine (NORVASC) 10 MG tablet TAKE 1/2 TABLET BY MOUTH EVERY DAY   ezetimibe (ZETIA) 10 MG tablet Take 5 mg by mouth daily.   fish oil-omega-3 fatty acids 1000 MG capsule Take 1 g by mouth every evening.   Multiple Vitamins-Minerals (CENTRUM PO) Take 1 tablet by mouth every evening.   pantoprazole (PROTONIX) 40 MG tablet TAKE 1 TABLET BY MOUTH ONCE DAILY 1/2 HOUR BEFORE A  MEAL   Pitavastatin Calcium (LIVALO) 4 MG TABS Take 0.5 tablets (2 mg total) by mouth daily.   Tiotropium Bromide-Olodaterol (STIOLTO RESPIMAT) 2.5-2.5 MCG/ACT AERS Inhale 2 puffs into the lungs daily.   Radiology:   No results found.  Cardiac Studies:  Carotid artery duplex 04/22/2021:  Right Carotid: Velocities in the right ICA are consistent with a 40-59% stenosis. The ECA appears >50% stenosed.  Left Carotid: Velocities in the left ICA are consistent with a 40-59% stenosis. Hemodynamically significant plaque >50% visualized in the  CCA. The ECA appears >50% stenosed.  Vertebrals:  Right vertebral artery demonstrates antegrade flow. Left vertebral artery demonstrates bidirectional flow.  Subclavians: Bilateral subclavian arteries were stenotic.   Carotid artery duplex 08/06/2020:  Stenosis in the right internal carotid artery (16-49%). Stenosis in the right common carotid artery (<50%). Stenosis in the right external carotid artery (<50%).  Stenosis in the left internal carotid artery (50-69%). Stenosis in the left common carotid artery (>50%). Stenosis in the left external carotid artery (<50%).  Antegrade right vertebral artery flow. Antegrade left vertebral artery flow.  Compared to 09/11/2017, there is progression of disease bilaterally, especially in common carotid arteries with homogenous plaque with high risk for embolic complications. Follow up in 6 months is appropriate if clinically indicated.   PCV ECHOCARDIOGRAM  COMPLETE 11/88/6773 Normal LV systolic function with visual EF 55-60%. Left ventricle cavity is normal in size. Moderate concentric hypertrophy of the left ventricle. Normal global wall motion. Left atrial cavity is moderately dilated at 4.6 cm. Trileaflet aortic valve. Mild aortic stenosis. Trace aortic regurgitation. Moderate aortic valve leaflet calcification. Mildly restricted aortic valve leaflets. Peak PG of 19.2 and mean PG of 10.3 mm Hg and Calculated aortic valve area 1.7 cm. Normal mitral valve thickness.  Mild prolapse of the mitral valve leaflets. Moderate posteriorly directed  (Grade III) mitral regurgitation. Mild tricuspid regurgitation. No evidence of pulmonary hypertension. RVSP measures 30 mmHg. Compared to 02/24/2016, MVP and MR new.   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 Previously noted 90% stenosis by angiogram on 05/01/2017 not noted.   Nuclear stress test   08/31/2017: 1. Pharmacologic stress testing was performed with intravenous administration of .4 mg of Lexiscan. 2. Stress EKG is non diagnostic for ischemia as it is a pharmacologic stress. 3. Gated SPECT images reveal normal myocardial thickening and wall motion. The left ventricular ejection fraction was calculated to be 60%. SPECT images demonstrate small perfusion abnormality of mild intensity in the basal inferior and mid inferior myocardial wall(s) on the stress images with mild reversibility on rest images. This could  represent diaphragmatic attenuation. 4. This is a low risk study.   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 Right PT and left AT occluded below knee, but otherwise mild disease.   Echocardiogram  [02/23/2016]: Left ventricle cavity is normal in size. Mild concentric hypertrophy of the left ventricle. Normal global wall motion. Doppler evidence of grade I (impaired)  diastolic dysfunction. LVEF visully estimated at 55-60%. Mild calcification of the aortic valve annulus. Mildly restricted aortic valve leaflets. Mild aortic valve leaflet calcification. No aortic valve regurgitation noted. Trace aortic valve stenosis. Aortic valve peak pressure gradient of 24 and mean gradient of 9 mmHg, calculated aortic valve area 2.32 cm. Mild tricuspid regurgitation. No evidence of pulmonary hypertension.  EKG:  05/16/2021: Marked sinus bradycardia rate of 49 bpm.  Normal axis.  LVH with secondary ST-T changes.  04/26/2021: Sinus bradycardia rate of 44 bpm.  Normal axis.  Left ventricular hypertrophy.  Nonspecific T wave abnormality.  Compared to EKG 08/12/2019, no significant change.  However compared to 07/26/2020, no PRWP noted.  07/26/2020: Sinus bradycardia at a rate of 52 bpm. Normal axis.  Left ventricular hypertrophy.  Nonspecific T wave abnormality.  Poor R wave progression, cannot exclude anterior infarct old.  Compared to EKG 08/12/2019, PRWP new.    Assessment     ICD-10-CM   1. Primary hypertension  I10 EKG 12-Lead    2. Carotid stenosis, bilateral  I65.23     3. Shortness of breath  R06.02 DG Chest 2 View    4. Cough  R05.9 DG Chest 2 View    5. Sinus bradycardia  R00.1 PCV CARDIAC STRESS TEST       Medications Discontinued During This Encounter  Medication Reason   albuterol (PROVENTIL) (2.5 MG/3ML) 0.083% nebulizer solution Error   benzonatate (TESSALON) 200 MG capsule Patient has not taken in last 30 days    No orders of the defined types were placed in this encounter.   Recommendations:   JENKINS RISDON is a 80 y.o. 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 Peripheral arteriogram on 05/01/2017 revealing right  renal artery 80% stenosis and right PT and left AT occluded below the knee but otherwise mild disease. Angiograms could not explain his right hip pain and right leg pain and was recommended evaluation for pseudo-claudication. Patient with history of upper GI bleed in 06/2020.   Patient presents for 2-week follow-up of bradycardia and hypertension.  At last visit discontinued metoprolol and increased amlodipine from 5 to 10 mg daily.  Also at last visit patient was struggling with medication compliance, and had not been taking aspirin.  Patient's blood pressure is well controlled, however he remains bradycardic although he is relatively asymptomatic.  Patient has not been taking aspirin 81 mg on a daily basis, advised him to do so.  Presently we will continue amlodipine, aspirin, Zetia, and Livalo.  Given patient's continued bradycardia despite discontinuation of beta-blocker therapy would recommend GXT to evaluate chronotropic competence.  Due to patient's recent symptoms of cough and shortness of breath will obtain chest x-ray.   Follow up in 6 weeks.    Alethia Berthold, PA-C 05/19/2021, 5:16 PM Office: [phone]  Addendum:  Chest x-ray 05/16/2021:  Bibasilar scarring. No acute abnormalities. Aortic Atherosclerosis (ICD10-I70.0).  Patient aware.  No sign  of pneumonia, pleural effusion.    Alethia Berthold, PA-C 05/19/2021, 5:16 PM Office: [phone]

## 2021-05-16 NOTE — Progress Notes (Signed)
No pneumonia on chest x-ray

## 2021-05-17 NOTE — Progress Notes (Signed)
Called and spoke with patient regarding his xray results.

## 2021-05-20 ENCOUNTER — Telehealth: Payer: Self-pay | Admitting: Student

## 2021-06-23 NOTE — Progress Notes (Deleted)
Primary Physician/Referring:  [name], MD  Patient ID: Nathaniel Long, male    DOB: [birth year], 80 y.o.   MRN: [id number]  No chief complaint on file.  HPI:    Nathaniel Long  is a 80 y.o. 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 Angiograms could not explain his right hip pain and right leg pain and was recommended evaluation for pseudo-claudication. Patient with history of upper GI bleed in 06/2020.   Notably patient is hard of hearing despite use of hearing aides.  Patient presents for 6-week follow-up.  At last office visit advised patient to resume aspirin 81 mg, ordered GXT to evaluate chronotropic competence, and obtain chest x-ray given cough which revealed no sign of pneumonia or pleural effusion. ***  ***  Patient presents for 2-week follow-up of bradycardia and hypertension.  At last visit discontinued metoprolol and increased amlodipine from 5 to 10 mg daily.  Also at last visit patient was struggling with medication compliance, and had not been taking aspirin.  Patient's primary concern is that he has developed cough and mild shortness of breath over the last few days.  Denies chest pain, palpitations, syncope, near syncope, claudication.  He does continue to have occasional brief episodes of dizziness particularly while working in the yard going from bending to standing.  He has not fallen.  Past Medical History:  Diagnosis Date   Anxiety    Cancer ([place])    lung   Carotid artery stenosis    Cataract    Chronic lower back pain    CKD (chronic kidney disease)    Complication of  anesthesia    "can't pee when I come out of OR" (07/18/2017)   Contact with chainsaw as cause of accidental injury 07/16/2017   sternal fracture with bilateral anterior fifth and sixth rib fractures /notes 07/16/2017   COPD (chronic obstructive pulmonary disease) (HCC)    Dyspnea    GERD (gastroesophageal reflux disease)    Hard of hearing    High cholesterol    Hypertension    Peripheral vascular disease ([place])    Thoracic ascending aortic aneurysm [facility])    Past Surgical History:  Procedure Laterality Date   ABDOMINAL AORTOGRAM W/LOWER EXTREMITY N/A 05/01/2017   Procedure: Abdominal Aortogram w/Lower Extremity;  Surgeon: Adrian Prows, MD;  Location: [facility];  Service: Cardiovascular;  Laterality: N/A;   BACK SURGERY     FINGER REPLANTATION Left    "ring finger"   HEMORRHOID SURGERY     POSTERIOR LUMBAR FUSION  1986   RENAL ANGIOGRAPHY Bilateral 12/18/2017   Procedure: RENAL ANGIOGRAPHY;  Surgeon: Nigel Mormon, MD;  Location: [place];  Service: Cardiovascular;  Laterality: Bilateral;   VIDEO ASSISTED THORACOSCOPY (VATS)/ LOBECTOMY Left 09/19/2017   Procedure: LEFT VIDEO ASSISTED THORACOSCOPY (VATS)/ LEFT UPPER LOBECTOMY, LYMPH NODE DISECTION;  Surgeon: Melrose Nakayama, MD;  Location: [place];  Service: Thoracic;  Laterality: Left;   VIDEO BRONCHOSCOPY WITH ENDOBRONCHIAL NAVIGATION N/A 09/05/2017   Procedure: VIDEO BRONCHOSCOPY WITH ENDOBRONCHIAL NAVIGATION;  Surgeon: Collene Gobble, MD;  Location: [place];  Service: Thoracic;  Laterality: N/A;   VIDEO BRONCHOSCOPY  WITH ENDOBRONCHIAL ULTRASOUND N/A 09/05/2017   Procedure: VIDEO BRONCHOSCOPY WITH ENDOBRONCHIAL ULTRASOUND;  Surgeon: Collene Gobble, MD;  Location: [facility];  Service: Thoracic;  Laterality: N/A;   Family History  Problem Relation Age of Onset   Heart disease Mother    Liver disease Father    Dementia Sister    Colon polyps Brother    Colon cancer Brother     Social History   Tobacco Use    Smoking status: Former    Packs/day: 1.00    Years: 33.00    Pack years: 33.00    Types: Cigarettes    Quit date: 04/17/2017    Years since quitting: 4.1   Smokeless tobacco: Never   Tobacco comments:    May, 2018  Substance Use Topics   Alcohol use: Not Currently    Comment: last drink 1995   Marital Status: Married   ROS  Review of Systems  Constitutional: Negative for malaise/fatigue and weight gain.  Cardiovascular:  Negative for chest pain, claudication, leg swelling, near-syncope, orthopnea, palpitations, paroxysmal nocturnal dyspnea and syncope.  Respiratory:  Positive for cough and shortness of breath.   Neurological:  Negative for dizziness.   Objective  There were no vitals taken for this visit.  Vitals with BMI 05/16/2021 05/16/2021 04/26/2021  Height - '5\' 10"'  '5\' 10"'   Weight - 180 lbs 10 oz 183 lbs 6 oz  BMI - 18.29 93.71  Systolic 696 789 381  Diastolic 65 59 53  Pulse 51 53 49      Physical Exam Vitals reviewed.  Cardiovascular:     Rate and Rhythm: Regular rhythm. Bradycardia present.     Pulses: Intact distal pulses.     Heart sounds: S1 normal and S2 normal. Murmur heard.    No gallop.  Pulmonary:     Effort: Pulmonary effort is normal. No respiratory distress.     Breath sounds: Rhonchi (bilareral throughout) present. No rales.     Comments: Expiratory wheezes  Musculoskeletal:     Right lower leg: No edema.     Left lower leg: No edema.  Neurological:     Mental Status: He is alert.   Laboratory examination:   Recent Labs    01/10/21 1109  NA 140  K 4.4  CL 109  CO2 21*  GLUCOSE 99  BUN 22  CREATININE 1.69*  CALCIUM 8.8*  GFRNONAA 41*    CrCl cannot be calculated (Patient's most recent lab result is older than the maximum 21 days allowed.).  CMP Latest Ref Rng & Units 01/10/2021 06/19/2020 06/18/2020  Glucose 70 - 99 mg/dL 99 104(H) 116(H)  BUN 8 - 23 mg/dL 22 26(H) 28(H)  Creatinine 0.61 - 1.24 mg/dL 1.69(H) 1.53(H) 1.56(H)  Sodium  135 - 145 mmol/L 140 139 135  Potassium 3.5 - 5.1 mmol/L 4.4 4.0 4.3  Chloride 98 - 111 mmol/L 109 109 106  CO2 22 - 32 mmol/L 21(L) 21(L) 19(L)  Calcium 8.9 - 10.3 mg/dL 8.8(L) 8.5(L) 8.2(L)  Total Protein 6.5 - 8.1 g/dL 7.4 6.4(L) 6.7  Total Bilirubin 0.3 - 1.2 mg/dL 1.5(H) 1.2 0.8  Alkaline Phos 38 - 126 U/L 66 54 54  AST 15 - 41 U/L 36 33 40  ALT 0 - 44 U/L 23 32 36   CBC Latest Ref Rng & Units 01/10/2021 07/21/2020 06/19/2020  WBC 4.0 - 10.5 K/uL 8.1 8.9 11.3(H)  Hemoglobin 13.0 - 17.0 g/dL 13.7 12.0(L) 11.1(L)  Hematocrit 39.0 - 52.0 % 41.5 35.3(L)  34.0(L)  Platelets 150 - 400 K/uL 318 348.0 329    Lipid Panel Recent Labs    07/26/20 1406  CHOL 114  TRIG 123  LDLCALC 56  HDL 36*     HEMOGLOBIN A1C Lab Results  Component Value Date   HGBA1C 6.0 02/21/2012   TSH No results for input(s): TSH in the last 8760 hours.  External labs:  10/12/2020: Total cholesterol 117, triglycerides 79, HDL 41, LDL 60 Hemoglobin 13.4, hematocrit 41.0, MCV 94.4, platelets 317 Sodium 138, potassium 4.5, glucose 102, BUN 22, creatinine 1.51, GFR 43 A1c 6.7%  07/02/2020:  Hemoglobin 11.1, hematocrit 33.2, RBC 3.46, platelet 160, CBC otherwise normal Sodium 137 potassium 4.9 BUN 15 creatinine 1.61, CMP otherwise normal EGFR 40  10/07/2015: HDL 35, LDL 99, total cholesterol 167, triglycerides 166  Allergies   Allergies  Allergen Reactions   Gabapentin Other (See Comments)    dizziness   Statins Other (See Comments)    Muscle weakness    Medications Prior to Visit:   Outpatient Medications Prior to Visit  Medication Sig Dispense Refill   ALPRAZolam (XANAX) 0.5 MG tablet TAKE 1 TABLET BY MOUTH EVERYDAY AT BEDTIME 30 tablet 3   amLODipine (NORVASC) 10 MG tablet TAKE 1/2 TABLET BY MOUTH EVERY DAY 45 tablet 3   aspirin EC 81 MG tablet Take 1 tablet (81 mg total) by mouth daily. Swallow whole. (Patient not taking: Reported on 05/16/2021) 90 tablet 3   calcium carbonate (TUMS - DOSED  IN MG ELEMENTAL CALCIUM) 500 MG chewable tablet Chew 1 tablet by mouth daily as needed for indigestion or heartburn. (Patient not taking: Reported on 05/19/2021)     ezetimibe (ZETIA) 10 MG tablet Take 5 mg by mouth daily.  3   fish oil-omega-3 fatty acids 1000 MG capsule Take 1 g by mouth every evening.     Multiple Vitamins-Minerals (CENTRUM PO) Take 1 tablet by mouth every evening.     pantoprazole (PROTONIX) 40 MG tablet TAKE 1 TABLET BY MOUTH ONCE DAILY 1/2 HOUR BEFORE A  MEAL 90 tablet 3   Pitavastatin Calcium (LIVALO) 4 MG TABS Take 0.5 tablets (2 mg total) by mouth daily. 45 tablet 3   polyethylene glycol (MIRALAX / GLYCOLAX) 17 g packet Take 17 g by mouth daily. (Patient not taking: Reported on 05/19/2021) 14 each 0   Tiotropium Bromide-Olodaterol (STIOLTO RESPIMAT) 2.5-2.5 MCG/ACT AERS Inhale 2 puffs into the lungs daily. 4 g 0   Facility-Administered Medications Prior to Visit  Medication Dose Route Frequency Provider Last Rate Last Admin   0.9 %  sodium chloride infusion  250 mL Intravenous PRN Gold, Wayne E, PA-C       levalbuterol (XOPENEX) nebulizer solution 0.63 mg  0.63 mg Nebulization Q6H PRN Gaye Pollack, MD   0.63 mg at 09/29/17 1646   sodium chloride flush (NS) 0.9 % injection 3 mL  3 mL Intravenous Q12H Gold, Wayne E, PA-C       sodium chloride flush (NS) 0.9 % injection 3 mL  3 mL Intravenous Q12H Gold, Wayne E, PA-C       sodium chloride flush (NS) 0.9 % injection 3 mL  3 mL Intravenous PRN [name], PA-C       Final Medications at End of Visit    No outpatient medications have been marked as taking for the 06/27/21 encounter (Appointment) with Alethia Berthold, PA-C.   Radiology:   No results found.  Cardiac Studies:  Carotid artery duplex 04/22/2021:  Right Carotid: Velocities in the right ICA are consistent with a 40-59% stenosis. The ECA appears >50% stenosed.  Left Carotid: Velocities in the left ICA are consistent with a 40-59% stenosis.  Hemodynamically significant plaque >50% visualized in the  CCA. The ECA appears >50% stenosed.  Vertebrals:  Right vertebral artery demonstrates antegrade flow. Left vertebral artery demonstrates bidirectional flow.  Subclavians: Bilateral subclavian arteries were stenotic.   Carotid artery duplex 08/06/2020:  Stenosis in the right internal carotid artery (16-49%). Stenosis in the right common carotid artery (<50%). Stenosis in the right external carotid artery (<50%).  Stenosis in the left internal carotid artery (50-69%). Stenosis in the left common carotid artery (>50%). Stenosis in the left external carotid artery (<50%).  Antegrade right vertebral artery flow. Antegrade left vertebral artery flow.  Compared to 09/11/2017, there is progression of disease bilaterally, especially in common carotid arteries with homogenous plaque with high risk for embolic complications. Follow up in 6 months is appropriate if clinically indicated.   PCV ECHOCARDIOGRAM COMPLETE 26/33/3545 Normal LV systolic function with visual EF 55-60%. Left ventricle cavity is normal in size. Moderate concentric hypertrophy of the left ventricle. Normal global wall motion. Left atrial cavity is moderately dilated at 4.6 cm. Trileaflet aortic valve. Mild aortic stenosis. Trace aortic regurgitation. Moderate aortic valve leaflet calcification. Mildly restricted aortic valve leaflets. Peak PG of 19.2 and mean PG of 10.3 mm Hg and Calculated aortic valve area 1.7 cm. Normal mitral valve thickness.  Mild prolapse of the mitral valve leaflets. Moderate posteriorly directed  (Grade III) mitral regurgitation. Mild tricuspid regurgitation. No evidence of pulmonary hypertension. RVSP measures 30 mmHg. Compared to 02/24/2016, MVP and MR new.   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 Previously noted 90% stenosis by angiogram on 05/01/2017 not  noted.   Nuclear stress test   08/31/2017: 1. Pharmacologic stress testing was performed with intravenous administration of .4 mg of Lexiscan. 2. Stress EKG is non diagnostic for ischemia as it is a pharmacologic stress. 3. Gated SPECT images reveal normal myocardial thickening and wall motion. The left ventricular ejection fraction was calculated to be 60%. SPECT images demonstrate small perfusion abnormality of mild intensity in the basal inferior and mid inferior myocardial wall(s) on the stress images with mild reversibility on rest images. This could represent diaphragmatic attenuation. 4. This is a low risk study.   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 Right PT and left AT occluded below knee, but otherwise mild disease.   Echocardiogram  [02/23/2016]: Left ventricle cavity is normal in size. Mild concentric hypertrophy of the left ventricle. Normal global wall motion. Doppler evidence of grade I (impaired) diastolic dysfunction. LVEF visully estimated at 55-60%. Mild calcification of the aortic valve annulus. Mildly restricted aortic valve leaflets. Mild aortic valve leaflet calcification. No aortic valve regurgitation noted. Trace aortic valve stenosis. Aortic valve peak pressure gradient of 24 and mean gradient of 9 mmHg, calculated aortic valve area 2.32 cm. Mild tricuspid regurgitation. No evidence of pulmonary hypertension.  EKG:  05/16/2021: Marked sinus bradycardia rate of 49 bpm.  Normal axis.  LVH with secondary ST-T changes.  04/26/2021: Sinus bradycardia rate of 44 bpm.  Normal axis.  Left ventricular hypertrophy.  Nonspecific T wave abnormality.  Compared to EKG 08/12/2019, no significant change.  However compared to 07/26/2020, no PRWP noted.  07/26/2020: Sinus bradycardia at a rate of 52 bpm. Normal axis.  Left ventricular hypertrophy.  Nonspecific T wave abnormality.  Poor R wave progression, cannot exclude anterior infarct old.   Compared to EKG 08/12/2019, PRWP new.    Assessment   No diagnosis found.    There are no discontinued medications.   No orders of the defined types were placed in this encounter.   Recommendations:   Nathaniel Long is a 80 y.o. 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 Angiograms could not explain his right hip pain and right leg pain and was recommended evaluation for pseudo-claudication. Patient with history of upper GI bleed in 06/2020.   Patient presents for 6-week follow-up.  At last office visit advised patient to resume aspirin 81 mg, ordered GXT to evaluate chronotropic competence, and obtain chest x-ray given cough which revealed no sign of pneumonia or pleural effusion. ***  ***  Patient presents for 2-week follow-up of bradycardia and hypertension.  At last visit discontinued metoprolol and increased amlodipine from 5 to 10 mg daily.  Also at last visit patient was struggling with medication compliance, and had not been taking aspirin.  Patient's blood pressure is well controlled, however he remains bradycardic although he is relatively asymptomatic.  Patient has not been taking aspirin 81 mg on a daily basis, advised him to do so.  Presently we will continue amlodipine, aspirin, Zetia, and Livalo.  Given patient's continued bradycardia despite discontinuation of beta-blocker therapy would recommend GXT to evaluate chronotropic competence.  Due to patient's recent symptoms of cough and shortness of breath will obtain chest x-ray.   Follow up in 6 weeks.    Alethia Berthold, PA-C 06/23/2021,  1:38 PM Office: [phone]  Addendum:  Chest x-ray 05/16/2021:  Bibasilar scarring. No acute abnormalities. Aortic Atherosclerosis (ICD10-I70.0).  Patient aware.  No sign of pneumonia, pleural effusion.    Alethia Berthold, PA-C 06/23/2021, 1:38 PM Office: [phone]

## 2021-06-27 ENCOUNTER — Ambulatory Visit: Payer: Medicare Other | Admitting: Student

## 2021-07-07 ENCOUNTER — Ambulatory Visit: Payer: Medicare Other

## 2021-07-07 ENCOUNTER — Other Ambulatory Visit: Payer: Self-pay

## 2021-07-07 DIAGNOSIS — R001 Bradycardia, unspecified: Secondary | ICD-10-CM

## 2021-07-08 NOTE — Progress Notes (Signed)
Exercise capacity was low, but heart rate did increase from baseline as we were hoping. Shows chronotropic competency.

## 2021-07-12 ENCOUNTER — Other Ambulatory Visit: Payer: Self-pay | Admitting: Student

## 2021-07-12 DIAGNOSIS — I10 Essential (primary) hypertension: Secondary | ICD-10-CM

## 2021-07-12 NOTE — Progress Notes (Signed)
Called patient, NA, VM-box full, cannot leave message.

## 2021-07-13 NOTE — Progress Notes (Signed)
Primary Physician/Referring:  [name], MD  Patient ID: Nathaniel Long, male    DOB: 04-16-1941, 80 y.o.   MRN: [id number]  Chief Complaint  Patient presents with   Hypertension   Bradycardia   MR   Follow-up    6 month   HPI:    Nathaniel Long  is a 80 y.o. 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 Angiograms could not explain his right hip pain and right leg pain and was recommended evaluation for pseudo-claudication. Patient with history of upper GI bleed in 06/2020.   Notably patient is hard of hearing despite use of hearing aides.  Patient presents for 6-week follow-up.  At last office visit advised patient to resume aspirin 81 mg, ordered GXT to evaluate chronotropic competence, and obtain chest x-ray given cough which revealed no sign of pneumonia or pleural effusion.  GXT revealed chronotropic competence.  Patient is feeling quite well since last visit.  Denies chest pain, dyspnea, dizziness, syncope, near syncope, claudication, palpitations.  Patient reports dizziness has completely resolved since last office visit.  He is able to work in his yard and increased his activity without issue.  Patient is not taking metoprolol, which is good as he has been directed multiple times to discontinue this.  Past Medical History:  Diagnosis Date   Anxiety    Cancer ([place])    lung   Carotid artery stenosis    Cataract    Chronic lower back pain    CKD (chronic kidney disease)    Complication of anesthesia    "can't pee when I come out of OR" (07/18/2017)   Contact with chainsaw as cause of  accidental injury 07/16/2017   sternal fracture with bilateral anterior fifth and sixth rib fractures /notes 07/16/2017   COPD (chronic obstructive pulmonary disease) (HCC)    Dyspnea    GERD (gastroesophageal reflux disease)    Hard of hearing    High cholesterol    Hypertension    Peripheral vascular disease ([place])    Thoracic ascending aortic aneurysm    Past Surgical History:  Procedure Laterality Date   ABDOMINAL AORTOGRAM W/LOWER EXTREMITY N/A 05/01/2017   Procedure: Abdominal Aortogram w/Lower Extremity;  Surgeon: Adrian Prows, MD;  Location: [facility];  Service: Cardiovascular;  Laterality: N/A;   BACK SURGERY     FINGER REPLANTATION Left    "ring finger"   HEMORRHOID SURGERY     POSTERIOR LUMBAR FUSION  1986   RENAL ANGIOGRAPHY Bilateral 12/18/2017   Procedure: RENAL ANGIOGRAPHY;  Surgeon: Nigel Mormon, MD;  Location: [facility];  Service: Cardiovascular;  Laterality: Bilateral;   VIDEO ASSISTED THORACOSCOPY (VATS)/ LOBECTOMY Left 09/19/2017   Procedure: LEFT VIDEO ASSISTED THORACOSCOPY (VATS)/ LEFT UPPER LOBECTOMY, LYMPH NODE DISECTION;  Surgeon: Melrose Nakayama, MD;  Location: [facility];  Service: Thoracic;  Laterality: Left;   VIDEO BRONCHOSCOPY WITH ENDOBRONCHIAL NAVIGATION N/A 09/05/2017   Procedure: VIDEO BRONCHOSCOPY WITH ENDOBRONCHIAL NAVIGATION;  Surgeon: Collene Gobble, MD;  Location: [facility];  Service: Thoracic;  Laterality: N/A;   VIDEO BRONCHOSCOPY WITH ENDOBRONCHIAL ULTRASOUND N/A 09/05/2017   Procedure: VIDEO BRONCHOSCOPY WITH ENDOBRONCHIAL ULTRASOUND;  Surgeon: Collene Gobble, MD;  Location: [facility];  Service: Thoracic;  Laterality: N/A;   Family History  Problem Relation Age of Onset   Heart disease Mother    Liver disease Father    Dementia Sister    Colon polyps Brother    Colon cancer Brother     Social History   Tobacco Use   Smoking status: Former    Packs/day: 1.00    Years: 33.00    Pack years: 33.00    Types: Cigarettes     Quit date: 04/17/2017    Years since quitting: 4.2   Smokeless tobacco: Never   Tobacco comments:    May, 2018  Substance Use Topics   Alcohol use: Not Currently    Comment: last drink 1995   Marital Status: Married   ROS  Review of Systems  Constitutional: Negative for malaise/fatigue and weight gain.  Cardiovascular:  Negative for chest pain, claudication, leg swelling, near-syncope, orthopnea, palpitations, paroxysmal nocturnal dyspnea and syncope.  Respiratory:  Negative for cough and shortness of breath.   Neurological:  Negative for dizziness.   Objective  Blood pressure (!) 123/56, pulse (!) 53, temperature 98 F (36.7 C), resp. rate 16, height 5' 10" (1.778 m), weight 183 lb (83 kg), SpO2 97 %.  Vitals with BMI 07/14/2021 05/16/2021 05/16/2021  Height 5' 10" - 5' 10"  Weight 183 lbs - 180 lbs 10 oz  BMI 40.98 - 11.91  Systolic 478 295 621  Diastolic 56 65 59  Pulse 53 51 53      Physical Exam Vitals reviewed.  Cardiovascular:     Rate and Rhythm: Regular rhythm. Bradycardia present.     Pulses: Intact distal pulses.     Heart sounds: S1 normal and S2 normal. Murmur heard.  Blowing holosystolic murmur is present with a grade of 2/6 at the apex.    No gallop.  Pulmonary:     Effort: Pulmonary effort is normal. No respiratory distress.     Breath sounds: No rhonchi or rales.  Musculoskeletal:     Right lower leg: No edema.     Left lower leg: No edema.  Neurological:     Mental Status: He is alert.   Laboratory examination:   Recent Labs    01/10/21 1109  NA 140  K 4.4  CL 109  CO2 21*  GLUCOSE 99  BUN 22  CREATININE 1.69*  CALCIUM 8.8*  GFRNONAA 41*   CrCl cannot be calculated (Patient's most recent lab result is older than the maximum 21 days allowed.).  CMP Latest Ref Rng & Units 01/10/2021 06/19/2020 06/18/2020  Glucose 70 - 99 mg/dL 99 104(H) 116(H)  BUN 8 - 23 mg/dL 22 26(H) 28(H)  Creatinine 0.61 - 1.24 mg/dL 1.69(H) 1.53(H) 1.56(H)  Sodium  135 - 145 mmol/L 140 139 135  Potassium 3.5 - 5.1 mmol/L 4.4 4.0 4.3  Chloride 98 - 111 mmol/L 109 109 106  CO2 22 - 32 mmol/L 21(L) 21(L) 19(L)  Calcium 8.9 - 10.3 mg/dL 8.8(L) 8.5(L) 8.2(L)  Total Protein 6.5 - 8.1 g/dL 7.4 6.4(L) 6.7  Total Bilirubin 0.3 - 1.2 mg/dL 1.5(H) 1.2 0.8  Alkaline Phos 38 - 126 U/L 66 54 54  AST 15 - 41 U/L 36 33 40  ALT 0 - 44 U/L 23 32 36   CBC Latest Ref Rng & Units 01/10/2021 07/21/2020 06/19/2020  WBC 4.0 - 10.5 K/uL 8.1 8.9 11.3(H)  Hemoglobin 13.0 - 17.0 g/dL 13.7 12.0(L) 11.1(L)  Hematocrit 39.0 - 52.0 % 41.5 35.3(L) 34.0(L)  Platelets 150 - 400 K/uL 318 348.0 329    Lipid Panel Recent Labs    07/26/20 1406  CHOL 114  TRIG 123  LDLCALC 56  HDL 36*    HEMOGLOBIN A1C Lab Results  Component Value Date   HGBA1C 6.0 02/21/2012   TSH No results for input(s): TSH in the last 8760 hours.  External labs:  10/12/2020: Total cholesterol 117, triglycerides 79, HDL 41, LDL 60 Hemoglobin 13.4, hematocrit 41.0, MCV 94.4, platelets 317 Sodium 138, potassium 4.5, glucose 102, BUN 22, creatinine 1.51, GFR 43 A1c 6.7%  07/02/2020:  Hemoglobin 11.1, hematocrit 33.2, RBC 3.46, platelet 160, CBC otherwise normal Sodium 137 potassium 4.9 BUN 15 creatinine 1.61, CMP otherwise normal EGFR 40  10/07/2015: HDL 35, LDL 99, total cholesterol 167, triglycerides 166  Allergies   Allergies  Allergen Reactions   Gabapentin Other (See Comments)    dizziness   Statins Other (See Comments)    Muscle weakness    Medications Prior to Visit:   Outpatient Medications Prior to Visit  Medication Sig Dispense Refill   ALPRAZolam (XANAX) 0.5 MG tablet TAKE 1 TABLET BY MOUTH EVERYDAY AT BEDTIME 30 tablet 3   amLODipine (NORVASC) 10 MG tablet TAKE 1/2 TABLET BY MOUTH EVERY DAY 45 tablet 3   aspirin EC 81 MG tablet Take 1 tablet (81 mg total) by mouth daily. Swallow whole. 90 tablet 3   calcium carbonate (TUMS - DOSED IN MG ELEMENTAL CALCIUM) 500 MG chewable tablet  Chew 1 tablet by mouth daily as needed for indigestion or heartburn.     ezetimibe (ZETIA) 10 MG tablet Take 5 mg by mouth daily.  3   fish oil-omega-3 fatty acids 1000 MG capsule Take 1 g by mouth every evening.     Multiple Vitamins-Minerals (CENTRUM PO) Take 1 tablet by mouth every evening.     pantoprazole (PROTONIX) 40 MG tablet TAKE 1 TABLET BY MOUTH ONCE DAILY 1/2 HOUR BEFORE A  MEAL 90 tablet 3   Pitavastatin Calcium (LIVALO) 4 MG TABS Take 0.5 tablets (2 mg total) by mouth daily. 45 tablet 3   polyethylene glycol (MIRALAX / GLYCOLAX) 17 g packet Take 17 g by mouth daily. 14 each 0   Tiotropium Bromide-Olodaterol (STIOLTO RESPIMAT) 2.5-2.5 MCG/ACT AERS Inhale 2 puffs into the lungs daily. 4 g 0   Facility-Administered Medications Prior to Visit  Medication Dose Route Frequency Provider Last Rate Last Admin   0.9 %  sodium chloride infusion  250 mL Intravenous PRN Gold, Wayne E, PA-C       levalbuterol (XOPENEX) nebulizer solution 0.63 mg  0.63 mg Nebulization Q6H PRN Gaye Pollack, MD   0.63 mg at 09/29/17 1646   sodium chloride flush (NS) 0.9 % injection 3 mL  3 mL Intravenous Q12H Gold, Wayne E, PA-C       sodium chloride flush (NS) 0.9 % injection 3 mL  3 mL Intravenous Q12H Gold, Wayne E, PA-C       sodium chloride flush (NS) 0.9 % injection 3 mL  3 mL Intravenous PRN Jadene Pierini E, PA-C       Final Medications at End of Visit    No outpatient medications have been marked as taking for the 07/14/21 encounter (Office Visit) with Alethia Berthold, PA-C.   Radiology:   No results found.  Cardiac Studies:  Exercise treadmill stress test 07/07/2021: Exercise treadmill stress test performed using Bruce protocol.  Patient reached  1.5 METS, and 57% of age predicted maximum heart rate.  Exercise capacity was very low.  No chest pain reported.  Normal heart rate and hemodynamic response for the level of activity performed.  Rest and stress EKG at 57% MPHR showed showed sinus rhythm,  occasional PVC. Inconclusive study with very limited and submaximal effort.   Carotid artery duplex 04/22/2021:  Right Carotid: Velocities in the right ICA are consistent with a 40-59% stenosis. The ECA appears >50% stenosed.  Left Carotid: Velocities in the left ICA are consistent with a 40-59% stenosis. Hemodynamically significant plaque >50% visualized in the  CCA. The ECA appears >50% stenosed.  Vertebrals:  Right vertebral artery demonstrates antegrade flow. Left vertebral artery demonstrates bidirectional flow.  Subclavians: Bilateral subclavian arteries were stenotic.   Carotid artery duplex 08/06/2020:  Stenosis in the right internal carotid artery (16-49%). Stenosis in the right common carotid artery (<50%). Stenosis in the right external carotid artery (<50%).  Stenosis in the left internal carotid artery (50-69%). Stenosis in the left common carotid artery (>50%). Stenosis in the left external carotid artery (<50%).  Antegrade right vertebral artery flow. Antegrade left vertebral artery flow.  Compared to 09/11/2017, there is progression of disease bilaterally, especially in common carotid arteries with homogenous plaque with high risk for embolic complications. Follow up in 6 months is appropriate if clinically indicated.   PCV ECHOCARDIOGRAM COMPLETE 62/22/9798 Normal LV systolic function with visual EF 55-60%. Left ventricle cavity is normal in size. Moderate concentric hypertrophy of the left ventricle. Normal global wall motion. Left atrial cavity is moderately dilated at 4.6 cm. Trileaflet aortic valve. Mild aortic stenosis. Trace aortic regurgitation. Moderate aortic valve leaflet calcification. Mildly restricted aortic valve leaflets. Peak PG of 19.2 and mean PG of 10.3 mm Hg and Calculated aortic valve area 1.7 cm. Normal mitral valve thickness.  Mild prolapse of the mitral valve leaflets. Moderate posteriorly directed  (Grade III) mitral regurgitation. Mild tricuspid  regurgitation. No evidence of pulmonary hypertension. RVSP measures 30 mmHg. Compared to 02/24/2016, MVP and MR new.   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 Previously noted 90% stenosis by angiogram on 05/01/2017 not noted.   Nuclear stress test   08/31/2017: 1. Pharmacologic stress testing was performed with intravenous administration of .4 mg of Lexiscan. 2. Stress EKG is non diagnostic for ischemia as it is a pharmacologic stress. 3. Gated SPECT images reveal normal myocardial thickening and wall motion. The left ventricular ejection fraction was calculated to be 60%. SPECT images demonstrate small perfusion abnormality of mild intensity in the basal inferior and mid inferior myocardial wall(s) on the stress images with mild reversibility on rest images. This could represent diaphragmatic attenuation. 4. This is a low risk study.   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 Right PT and left AT occluded below knee, but otherwise mild disease.   Echocardiogram  [02/23/2016]: Left ventricle cavity is normal in size. Mild concentric hypertrophy of the left ventricle. Normal global wall motion. Doppler evidence of grade I (impaired) diastolic dysfunction. LVEF visully estimated at 55-60%. Mild calcification of the aortic valve annulus. Mildly restricted aortic valve leaflets. Mild aortic valve leaflet calcification. No aortic valve regurgitation noted. Trace aortic valve stenosis. Aortic valve peak pressure gradient of 24 and mean gradient of 9 mmHg, calculated aortic valve area 2.32 cm. Mild tricuspid regurgitation. No evidence of pulmonary hypertension.  EKG:  07/14/2021: Sinus bradycardia rate of 52 bpm.  05/16/2021: Marked sinus bradycardia rate of 49 bpm.  Normal axis.  LVH with secondary ST-T changes.  04/26/2021: Sinus bradycardia rate of 44 bpm.  Normal axis.   Left ventricular hypertrophy.  Nonspecific T wave abnormality.  Compared to EKG 08/12/2019, no significant change.  However compared to 07/26/2020, no PRWP noted.  07/26/2020: Sinus bradycardia at a rate of 52 bpm. Normal axis.  Left ventricular hypertrophy.  Nonspecific T wave abnormality.  Poor R wave progression, cannot exclude anterior infarct old.  Compared to EKG 08/12/2019, PRWP new.    Assessment     ICD-10-CM   1. Sinus bradycardia  R00.1 EKG 12-Lead    2. Primary hypertension  I10        There are no discontinued medications.   No orders of the defined types were placed in this encounter.   Recommendations:   DAYMIEN GOTH is a 80 y.o. 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 Angiograms could not explain his right hip pain and right leg pain and was recommended evaluation for pseudo-claudication. Patient with history of upper GI bleed in 06/2020.   Patient presents for 6-week follow-up.  At last office visit advised patient to resume aspirin 81 mg, ordered GXT to evaluate chronotropic competence, and obtain chest x-ray given cough which revealed no sign of pneumonia or pleural effusion.  Reviewed and discussed with patient results of GXT, which demonstrated chronotropic competence.  Patient's symptoms of cough and shortness of breath have resolved since last office visit.  Overall he is feeling much improved with no recurrence of dizziness and improved energy levels.  Will continue present medications.  Patient is otherwise stable from a cardiovascular standpoint.  Follow-up in 6 months, sooner  if needed, for hypertension, hyperlipidemia, PAD, bradycardia.   Alethia Berthold, PA-C 07/14/2021, 12:04 PM Office: [phone]

## 2021-07-13 NOTE — Progress Notes (Signed)
Called patient, NA, no VM-box to leave a message.

## 2021-07-14 ENCOUNTER — Ambulatory Visit: Payer: Medicare Other | Admitting: Student

## 2021-07-14 ENCOUNTER — Encounter: Payer: Self-pay | Admitting: Student

## 2021-07-14 ENCOUNTER — Other Ambulatory Visit: Payer: Self-pay

## 2021-07-14 VITALS — BP 123/56 | HR 53 | Temp 98.0°F | Resp 16 | Ht 70.0 in | Wt 183.0 lb

## 2021-07-14 DIAGNOSIS — R001 Bradycardia, unspecified: Secondary | ICD-10-CM

## 2021-07-14 DIAGNOSIS — I10 Essential (primary) hypertension: Secondary | ICD-10-CM

## 2021-07-18 ENCOUNTER — Other Ambulatory Visit: Payer: Self-pay | Admitting: Cardiology

## 2021-07-18 DIAGNOSIS — F411 Generalized anxiety disorder: Secondary | ICD-10-CM

## 2021-07-18 DIAGNOSIS — F5101 Primary insomnia: Secondary | ICD-10-CM

## 2021-07-19 ENCOUNTER — Other Ambulatory Visit: Payer: Self-pay

## 2021-07-19 ENCOUNTER — Telehealth: Payer: Self-pay

## 2021-07-19 DIAGNOSIS — F411 Generalized anxiety disorder: Secondary | ICD-10-CM

## 2021-07-19 DIAGNOSIS — F5101 Primary insomnia: Secondary | ICD-10-CM

## 2021-07-19 NOTE — Telephone Encounter (Signed)
Pharmacy requested refill for pts Alprazolam 0.5mg .

## 2021-07-20 ENCOUNTER — Other Ambulatory Visit: Payer: Self-pay

## 2021-07-20 ENCOUNTER — Other Ambulatory Visit: Payer: Self-pay | Admitting: Cardiology

## 2021-07-20 DIAGNOSIS — F5101 Primary insomnia: Secondary | ICD-10-CM

## 2021-07-20 DIAGNOSIS — F411 Generalized anxiety disorder: Secondary | ICD-10-CM

## 2021-07-20 MED ORDER — ALPRAZOLAM 0.5 MG PO TABS: ORAL_TABLET | ORAL | 0 refills | Status: DC

## 2021-07-20 NOTE — Telephone Encounter (Signed)
That is fine 

## 2021-07-20 NOTE — Telephone Encounter (Signed)
Pt already had picked up the medication. He did not need a refill.

## 2021-07-20 NOTE — Telephone Encounter (Signed)
Refill request

## 2021-08-10 ENCOUNTER — Other Ambulatory Visit: Payer: Self-pay

## 2021-08-10 DIAGNOSIS — E7800 Pure hypercholesterolemia, unspecified: Secondary | ICD-10-CM

## 2021-08-10 MED ORDER — LIVALO 4 MG PO TABS: 2.0000 mg | ORAL_TABLET | Freq: Every day | ORAL | 3 refills | Status: DC

## 2021-08-17 ENCOUNTER — Other Ambulatory Visit: Payer: Self-pay | Admitting: Cardiology

## 2021-08-17 DIAGNOSIS — F411 Generalized anxiety disorder: Secondary | ICD-10-CM

## 2021-08-17 DIAGNOSIS — F5101 Primary insomnia: Secondary | ICD-10-CM

## 2021-08-17 NOTE — Telephone Encounter (Signed)
Okay to refill? 

## 2021-08-17 NOTE — Telephone Encounter (Signed)
Please advise patient to follow up with his PCP for this. It is an anti-anxiety medication that I would prefer PCP to manage long-term

## 2021-08-18 NOTE — Telephone Encounter (Signed)
Called and spoke to pt, pt is aware.

## 2021-09-19 NOTE — Telephone Encounter (Signed)
error 

## 2021-10-05 ENCOUNTER — Other Ambulatory Visit: Payer: Self-pay | Admitting: Cardiology

## 2021-10-05 DIAGNOSIS — F411 Generalized anxiety disorder: Secondary | ICD-10-CM

## 2021-10-05 DIAGNOSIS — F5101 Primary insomnia: Secondary | ICD-10-CM

## 2021-10-05 MED ORDER — ALPRAZOLAM 0.5 MG PO TABS: ORAL_TABLET | ORAL | 3 refills | Status: DC

## 2021-10-13 ENCOUNTER — Other Ambulatory Visit: Payer: Self-pay | Admitting: Internal Medicine

## 2021-10-13 ENCOUNTER — Ambulatory Visit
Admission: RE | Admit: 2021-10-13 | Discharge: 2021-10-13 | Disposition: A | Discharge status: Home / Self Care | Payer: Medicare Other | Source: Ambulatory Visit | Attending: Internal Medicine | Admitting: Internal Medicine

## 2021-10-13 ENCOUNTER — Other Ambulatory Visit: Payer: Self-pay

## 2021-10-13 DIAGNOSIS — T1490XA Injury, unspecified, initial encounter: Secondary | ICD-10-CM

## 2021-10-18 ENCOUNTER — Other Ambulatory Visit: Payer: Self-pay

## 2021-10-18 MED ORDER — PANTOPRAZOLE SODIUM 40 MG PO TBEC: DELAYED_RELEASE_TABLET | ORAL | 3 refills | Status: DC

## 2021-10-27 NOTE — Progress Notes (Signed)
[address] [place] Phone: [phone] Subjective:   Nathaniel Long, am serving as a scribe for Dr. Hulan Saas. This visit occurred during the SARS-CoV-2 public health emergency.  Safety protocols were in place, including screening questions prior to the visit, additional usage of staff PPE, and extensive cleaning of exam room while observing appropriate contact time as indicated for disinfecting solutions.  I'm seeing this patient by the request  of:  [name], MD  CC: Right lower leg pain.  [name]  Nathaniel Long is a 81 y.o. male coming in with complaint of R leg pain. Patient states that he is having swelling over lateral aspect of lower leg for past 2 weeks. Patient was PCP and they did xray and blood flow. Notices lump superior to lateral mal.  Patient was seen by an outside provider.  Patient did have x-rays done and I was able to find the read that was unremarkable.  Patient also had a Doppler done that did not show any type of deep venous thrombosis.  Patient states overall it seems to stabilize.  Not having any pain at this time and states that it initially was significantly larger.  Difficulty communicating with patient secondary to being hard of hearing       Past Medical History:  Diagnosis Date   Anxiety    Cancer ([place])    lung   Carotid artery stenosis    Cataract    Chronic lower back pain    CKD (chronic kidney disease)    Complication of anesthesia    "can't pee when I come out of OR" (07/18/2017)   Contact with chainsaw as cause of accidental injury 07/16/2017   sternal fracture with bilateral anterior fifth and sixth rib fractures /notes 07/16/2017   COPD (chronic obstructive pulmonary disease) (HCC)    Dyspnea    GERD (gastroesophageal reflux disease)    Hard of hearing    High cholesterol    Hypertension    Peripheral vascular disease ([place])    Thoracic ascending aortic aneurysm     Past Surgical History:  Procedure Laterality Date   ABDOMINAL AORTOGRAM W/LOWER EXTREMITY N/A 05/01/2017   Procedure: Abdominal Aortogram w/Lower Extremity;  Surgeon: Adrian Prows, MD;  Location: [facility];  Service: Cardiovascular;  Laterality: N/A;   BACK SURGERY     FINGER REPLANTATION Left    "ring finger"   HEMORRHOID SURGERY     POSTERIOR LUMBAR FUSION  1986   RENAL ANGIOGRAPHY Bilateral 12/18/2017   Procedure: RENAL ANGIOGRAPHY;  Surgeon: Nigel Mormon, MD;  Location: [place] [facility];  Service: Cardiovascular;  Laterality: Bilateral;   VIDEO ASSISTED THORACOSCOPY (VATS)/ LOBECTOMY Left 09/19/2017   Procedure: LEFT VIDEO ASSISTED THORACOSCOPY (VATS)/ LEFT UPPER LOBECTOMY, LYMPH NODE DISECTION;  Surgeon: Melrose Nakayama, MD;  Location: [place];  Service: Thoracic;  Laterality: Left;   VIDEO BRONCHOSCOPY WITH ENDOBRONCHIAL NAVIGATION N/A 09/05/2017   Procedure: VIDEO BRONCHOSCOPY WITH ENDOBRONCHIAL NAVIGATION;  Surgeon: Collene Gobble, MD;  Location: [facility];  Service: Thoracic;  Laterality: N/A;   VIDEO BRONCHOSCOPY WITH ENDOBRONCHIAL ULTRASOUND N/A 09/05/2017   Procedure: VIDEO BRONCHOSCOPY WITH ENDOBRONCHIAL ULTRASOUND;  Surgeon: Collene Gobble, MD;  Location: [facility];  Service: Thoracic;  Laterality: N/A;   Social History   Socioeconomic History   Marital status: Married    Spouse name: Not on file   Number of children: Not on file   Years of education:  Not on file   Highest education level: Not on file  Occupational History   Not on file  Tobacco Use   Smoking status: Former    Packs/day: 1.00    Years: 33.00    Pack years: 33.00    Types: Cigarettes    Quit date: 04/17/2017    Years since quitting: 4.5   Smokeless tobacco: Never   Tobacco comments:    May, 2018  Vaping Use   Vaping Use: Never used  Substance and Sexual Activity   Alcohol use: Not Currently    Comment: last drink 1995   Drug use: Long   Sexual activity: Not Currently  Other  Topics Concern   Not on file  Social History Narrative   Not on file   Social Determinants of Health   Financial Resource Strain: Not on file  Food Insecurity: Not on file  Transportation Needs: Not on file  Physical Activity: Not on file  Stress: Not on file  Social Connections: Not on file   Allergies  Allergen Reactions   Gabapentin Other (See Comments)    dizziness   Statins Other (See Comments)    Muscle weakness   Family History  Problem Relation Age of Onset   Heart disease Mother    Liver disease Father    Dementia Sister    Colon polyps Brother    Colon cancer Brother       Current Outpatient Medications (Cardiovascular):    amLODipine (NORVASC) 10 MG tablet, TAKE 1/2 TABLET BY MOUTH EVERY DAY   ezetimibe (ZETIA) 10 MG tablet, Take 5 mg by mouth daily.   Pitavastatin Calcium (LIVALO) 4 MG TABS, Take 0.5 tablets (2 mg total) by mouth daily.   Current Outpatient Medications (Respiratory):    Tiotropium Bromide-Olodaterol (STIOLTO RESPIMAT) 2.5-2.5 MCG/ACT AERS, Inhale 2 puffs into the lungs daily.   Facility-Administered Medications Ordered in Other Visits (Respiratory):    levalbuterol (XOPENEX) nebulizer solution 0.63 mg  Current Outpatient Medications (Analgesics):    aspirin EC 81 MG tablet, Take 1 tablet (81 mg total) by mouth daily. Swallow whole.     Current Outpatient Medications (Other):    ALPRAZolam (XANAX) 0.5 MG tablet, TAKE 1 TABLET BY MOUTH EVERYDAY AT BEDTIME   calcium carbonate (TUMS - DOSED IN MG ELEMENTAL CALCIUM) 500 MG chewable tablet, Chew 1 tablet by mouth daily as needed for indigestion or heartburn.   fish oil-omega-3 fatty acids 1000 MG capsule, Take 1 g by mouth every evening.   Multiple Vitamins-Minerals (CENTRUM PO), Take 1 tablet by mouth every evening.   pantoprazole (PROTONIX) 40 MG tablet, TAKE 1 TABLET BY MOUTH ONCE DAILY 1/2 HOUR BEFORE A  MEAL   polyethylene glycol (MIRALAX / GLYCOLAX) 17 g packet, Take 17 g by mouth  daily.   Facility-Administered Medications Ordered in Other Visits (Other):    0.9 %  sodium chloride infusion   sodium chloride flush (NS) 0.9 % injection 3 mL   sodium chloride flush (NS) 0.9 % injection 3 mL   sodium chloride flush (NS) 0.9 % injection 3 mL Long current facility-administered medications for this visit.  Did review outside records from [place] for the x-rays, office visit, as well as the Doppler that was negative.  Objective  Blood pressure 104/64, pulse (!) 56, height 5\' 10"  (1.778 m), weight 180 lb (81.6 kg), SpO2 96 %.   General: Long apparent distress alert and oriented x3 mood and affect normal, dressed appropriately.  HEENT: Pupils equal, extraocular movements intact  Respiratory: Patient's speak in full sentences and does not appear short of breath patient is extremely hard of hearing Cardiovascular: Long lower extremity edema, non tender, Long erythema  Gait antalgic Right leg does show the patient does have swelling on the anterior lateral aspect just approximately 4 cm from the ankle mortise.  Patient is nontender in this area.  Minorly fluctuant.  Long warmness noted though.  Limited muscular skeletal ultrasound was performed and interpreted by Hulan Saas, M  Limited ultrasound shows the patient does have a hypoechoic changes approximately 3 cm that is underneath the superficial dermis.  Does not seem to affect the underlying musculature.  Long significant abnormal blood flow.  Seems to be consistent with a potential hematoma. Impression: Likely hematoma.   Impression and Recommendations:    The above documentation has been reviewed and is accurate and complete Lyndal Pulley, DO

## 2021-10-28 ENCOUNTER — Other Ambulatory Visit: Payer: Self-pay

## 2021-10-28 ENCOUNTER — Encounter: Payer: Self-pay | Admitting: Family Medicine

## 2021-10-28 ENCOUNTER — Ambulatory Visit (INDEPENDENT_AMBULATORY_CARE_PROVIDER_SITE_OTHER): Payer: Medicare Other | Admitting: Family Medicine

## 2021-10-28 ENCOUNTER — Ambulatory Visit (INDEPENDENT_AMBULATORY_CARE_PROVIDER_SITE_OTHER): Payer: Medicare Other

## 2021-10-28 ENCOUNTER — Ambulatory Visit: Payer: Self-pay

## 2021-10-28 VITALS — BP 104/64 | HR 56 | Ht 70.0 in | Wt 180.0 lb

## 2021-10-28 DIAGNOSIS — S9001XA Contusion of right ankle, initial encounter: Secondary | ICD-10-CM | POA: Diagnosis not present

## 2021-10-28 DIAGNOSIS — M79604 Pain in right leg: Secondary | ICD-10-CM

## 2021-10-28 NOTE — Patient Instructions (Signed)
Wear tight socks for next 2 weeks Do not wear tight socks or ace wrap at night If symptoms worsen, please seek medical care at the emergency room Call if this area gets bigger See me again in 4 weeks

## 2021-10-28 NOTE — Assessment & Plan Note (Signed)
Discussed with patient at this point it seems to be more of a hematoma.  Patient does not remember hitting it specifically but I do think that there was a possibility that this did occur.  Patient is not on any blood thinners.  Discussed potential compression.  Patient states that it does seem to be getting better.  We did discuss the possibility of aspiration which patient declined.  We also discussed the possibility of advanced imaging but I do not think it would change medical management at this time.  Patient is in agreement to continue to monitor and we will follow-up again in 3 to 4 weeks to make sure that it is decreasing in size.

## 2021-11-08 ENCOUNTER — Other Ambulatory Visit: Payer: Self-pay | Admitting: Student

## 2021-11-08 DIAGNOSIS — E7800 Pure hypercholesterolemia, unspecified: Secondary | ICD-10-CM

## 2021-11-25 NOTE — Progress Notes (Unsigned)
[place] Phone: [phone] Subjective:    I'm seeing this patient by the request  of:  [name], MD  CC:   [id number]  10/28/2021 Discussed with patient at this point it seems to be more of a hematoma.  Patient does not remember hitting it specifically but I do think that there was a possibility that this did occur.  Patient is not on any blood thinners.  Discussed potential compression.  Patient states that it does seem to be getting better.  We did discuss the possibility of aspiration which patient declined.  We also discussed the possibility of advanced imaging but I do not think it would change medical management at this time.  Patient is in agreement to continue to monitor and we will follow-up again in 3 to 4 weeks to make sure that it is decreasing in size.  Update 11/28/2021 Nathaniel Long is a 81 y.o. male coming in with complaint of R leg and ankle pain. Patient states       Past Medical History:  Diagnosis Date   Anxiety    Cancer ([place])    lung   Carotid artery stenosis    Cataract    Chronic lower back pain    CKD (chronic kidney disease)    Complication of anesthesia    "can't pee when I come out of OR" (07/18/2017)   Contact with chainsaw as cause of accidental injury 07/16/2017   sternal fracture with bilateral anterior fifth and sixth rib fractures /notes 07/16/2017   COPD (chronic obstructive pulmonary disease) (HCC)    Dyspnea    GERD (gastroesophageal reflux disease)    Hard of hearing    High cholesterol    Hypertension    Peripheral vascular disease ([place])    Thoracic ascending aortic aneurysm    Past Surgical History:  Procedure Laterality Date   ABDOMINAL AORTOGRAM W/LOWER EXTREMITY N/A 05/01/2017   Procedure: Abdominal Aortogram w/Lower Extremity;  Surgeon: Adrian Prows, MD;  Location: [facility];  Service: Cardiovascular;  Laterality: N/A;   BACK SURGERY      FINGER REPLANTATION Left    "ring finger"   HEMORRHOID SURGERY     POSTERIOR LUMBAR FUSION  1986   RENAL ANGIOGRAPHY Bilateral 12/18/2017   Procedure: RENAL ANGIOGRAPHY;  Surgeon: Nigel Mormon, MD;  Location: [facility];  Service: Cardiovascular;  Laterality: Bilateral;   VIDEO ASSISTED THORACOSCOPY (VATS)/ LOBECTOMY Left 09/19/2017   Procedure: LEFT VIDEO ASSISTED THORACOSCOPY (VATS)/ LEFT UPPER LOBECTOMY, LYMPH NODE DISECTION;  Surgeon: Melrose Nakayama, MD;  Location: [facility];  Service: Thoracic;  Laterality: Left;   VIDEO BRONCHOSCOPY WITH ENDOBRONCHIAL NAVIGATION N/A 09/05/2017   Procedure: VIDEO BRONCHOSCOPY WITH ENDOBRONCHIAL NAVIGATION;  Surgeon: Collene Gobble, MD;  Location: [facility];  Service: Thoracic;  Laterality: N/A;   VIDEO BRONCHOSCOPY WITH ENDOBRONCHIAL ULTRASOUND N/A 09/05/2017   Procedure: VIDEO BRONCHOSCOPY WITH ENDOBRONCHIAL ULTRASOUND;  Surgeon: Collene Gobble, MD;  Location: [facility];  Service: Thoracic;  Laterality: N/A;   Social History   Socioeconomic History   Marital status: Married    Spouse name: Not on file   Number of children: Not on file   Years of education: Not on file   Highest education level: Not on file  Occupational History   Not on file  Tobacco Use   Smoking status: Former    Packs/day: 1.00    Years: 33.00    Pack  years: 33.00    Types: Cigarettes    Quit date: 04/17/2017    Years since quitting: 4.6   Smokeless tobacco: Never   Tobacco comments:    May, 2018  Vaping Use   Vaping Use: Never used  Substance and Sexual Activity   Alcohol use: Not Currently    Comment: last drink 1995   Drug use: No   Sexual activity: Not Currently  Other Topics Concern   Not on file  Social History Narrative   Not on file   Social Determinants of Health   Financial Resource Strain: Not on file  Food Insecurity: Not on file  Transportation Needs: Not on file  Physical Activity: Not on file  Stress: Not on file  Social  Connections: Not on file   Allergies  Allergen Reactions   Gabapentin Other (See Comments)    dizziness   Statins Other (See Comments)    Muscle weakness   Family History  Problem Relation Age of Onset   Heart disease Mother    Liver disease Father    Dementia Sister    Colon polyps Brother    Colon cancer Brother       Current Outpatient Medications (Cardiovascular):    amLODipine (NORVASC) 10 MG tablet, TAKE 1/2 TABLET BY MOUTH EVERY DAY   ezetimibe (ZETIA) 10 MG tablet, Take 5 mg by mouth daily.   LIVALO 4 MG TABS, TAKE 1/2TABLET DAILY   Current Outpatient Medications (Respiratory):    Tiotropium Bromide-Olodaterol (STIOLTO RESPIMAT) 2.5-2.5 MCG/ACT AERS, Inhale 2 puffs into the lungs daily.   Facility-Administered Medications Ordered in Other Visits (Respiratory):    levalbuterol (XOPENEX) nebulizer solution 0.63 mg  Current Outpatient Medications (Analgesics):    aspirin EC 81 MG tablet, Take 1 tablet (81 mg total) by mouth daily. Swallow whole.     Current Outpatient Medications (Other):    ALPRAZolam (XANAX) 0.5 MG tablet, TAKE 1 TABLET BY MOUTH EVERYDAY AT BEDTIME   calcium carbonate (TUMS - DOSED IN MG ELEMENTAL CALCIUM) 500 MG chewable tablet, Chew 1 tablet by mouth daily as needed for indigestion or heartburn.   fish oil-omega-3 fatty acids 1000 MG capsule, Take 1 g by mouth every evening.   Multiple Vitamins-Minerals (CENTRUM PO), Take 1 tablet by mouth every evening.   pantoprazole (PROTONIX) 40 MG tablet, TAKE 1 TABLET BY MOUTH ONCE DAILY 1/2 HOUR BEFORE A  MEAL   polyethylene glycol (MIRALAX / GLYCOLAX) 17 g packet, Take 17 g by mouth daily.   Facility-Administered Medications Ordered in Other Visits (Other):    0.9 %  sodium chloride infusion   sodium chloride flush (NS) 0.9 % injection 3 mL   sodium chloride flush (NS) 0.9 % injection 3 mL   sodium chloride flush (NS) 0.9 % injection 3 mL No current facility-administered medications for this  visit.   Reviewed prior external information including notes and imaging from  primary care provider As well as notes that were available from care everywhere and other healthcare systems.  Past medical history, social, surgical and family history all reviewed in electronic medical record.  No pertanent information unless stated regarding to the chief complaint.   Review of Systems:  No headache, visual changes, nausea, vomiting, diarrhea, constipation, dizziness, abdominal pain, skin rash, fevers, chills, night sweats, weight loss, swollen lymph nodes, body aches, joint swelling, chest pain, shortness of breath, mood changes. POSITIVE muscle aches  Objective  There were no vitals taken for this visit.   General: No apparent distress  alert and oriented x3 mood and affect normal, dressed appropriately.  HEENT: Pupils equal, extraocular movements intact  Respiratory: Patient's speak in full sentences and does not appear short of breath  Cardiovascular: No lower extremity edema, non tender, no erythema  Gait normal with good balance and coordination.  MSK:  Non tender with full range of motion and good stability and symmetric strength and tone of shoulders, elbows, wrist, hip, knee and ankles bilaterally.     Impression and Recommendations:     The above documentation has been reviewed and is accurate and complete Jacqualin Combes

## 2021-11-28 ENCOUNTER — Ambulatory Visit: Payer: Medicare Other | Admitting: Family Medicine

## 2021-11-29 NOTE — Progress Notes (Signed)
Nathaniel Long [address] [place] Phone: [phone] Subjective:   Nathaniel Long, Nathaniel serving as a scribe for Dr. Hulan Saas. This visit occurred during the SARS-CoV-2 public health emergency.  Safety protocols were in place, including screening questions prior to the visit, additional usage of staff PPE, and extensive cleaning of exam room while observing appropriate contact time as indicated for disinfecting solutions.   I'm seeing this patient by the request  of:  [name], MD  CC: leg pain   MOQ:[id number]  10/28/2021 Discussed with patient at this point it seems to be more of a hematoma.  Patient does not remember hitting it specifically but I do think that there was a possibility that this did occur.  Patient is not on any blood thinners.  Discussed potential compression.  Patient states that it does seem to be getting better.  We did discuss the possibility of aspiration which patient declined.  We also discussed the possibility of advanced imaging but I do not think it would change medical management at this time.  Patient is in agreement to continue to monitor and we will follow-up again in 3 to 4 weeks to make sure that it is decreasing in size.  Updated 11/30/2021 Nathaniel Long is a 81 y.o. male coming in with complaint of leg pain. No leg pain. Just checking for swelling. Bout gone away per patient. No new complaints  Xray right knee (-)     Past Medical History:  Diagnosis Date   Anxiety    Cancer ([place])    lung   Carotid artery stenosis    Cataract    Chronic lower back pain    CKD (chronic kidney disease)    Complication of anesthesia    "can't pee when I come out of OR" (07/18/2017)   Contact with chainsaw as cause of accidental injury 07/16/2017   sternal fracture with bilateral anterior fifth and sixth rib fractures /notes 07/16/2017   COPD (chronic obstructive pulmonary disease) (HCC)    Dyspnea    GERD  (gastroesophageal reflux disease)    Hard of hearing    High cholesterol    Hypertension    Peripheral vascular disease ([place])    Thoracic ascending aortic aneurysm    Past Surgical History:  Procedure Laterality Date   ABDOMINAL AORTOGRAM W/LOWER EXTREMITY N/A 05/01/2017   Procedure: Abdominal Aortogram w/Lower Extremity;  Surgeon: Adrian Prows, MD;  Location: [facility];  Service: Cardiovascular;  Laterality: N/A;   BACK SURGERY     FINGER REPLANTATION Left    "ring finger"   HEMORRHOID SURGERY     POSTERIOR LUMBAR FUSION  1986   RENAL ANGIOGRAPHY Bilateral 12/18/2017   Procedure: RENAL ANGIOGRAPHY;  Surgeon: Nigel Mormon, MD;  Location: [facility];  Service: Cardiovascular;  Laterality: Bilateral;   VIDEO ASSISTED THORACOSCOPY (VATS)/ LOBECTOMY Left 09/19/2017   Procedure: LEFT VIDEO ASSISTED THORACOSCOPY (VATS)/ LEFT UPPER LOBECTOMY, LYMPH NODE DISECTION;  Surgeon: Melrose Nakayama, MD;  Location: [facility];  Service: Thoracic;  Laterality: Left;   VIDEO BRONCHOSCOPY WITH ENDOBRONCHIAL NAVIGATION N/A 09/05/2017   Procedure: VIDEO BRONCHOSCOPY WITH ENDOBRONCHIAL NAVIGATION;  Surgeon: Collene Gobble, MD;  Location: [facility];  Service: Thoracic;  Laterality: N/A;   VIDEO BRONCHOSCOPY WITH ENDOBRONCHIAL ULTRASOUND N/A 09/05/2017   Procedure: VIDEO BRONCHOSCOPY WITH ENDOBRONCHIAL ULTRASOUND;  Surgeon: Collene Gobble, MD;  Location: [facility];  Service: Thoracic;  Laterality: N/A;   Social History  Socioeconomic History   Marital status: Married    Spouse name: Not on file   Number of children: Not on file   Years of education: Not on file   Highest education level: Not on file  Occupational History   Not on file  Tobacco Use   Smoking status: Former    Packs/day: 1.00    Years: 33.00    Pack years: 33.00    Types: Cigarettes    Quit date: 04/17/2017    Years since quitting: 4.6   Smokeless tobacco: Never   Tobacco comments:    May, 2018  Vaping Use   Vaping  Use: Never used  Substance and Sexual Activity   Alcohol use: Not Currently    Comment: last drink 1995   Drug use: No   Sexual activity: Not Currently  Other Topics Concern   Not on file  Social History Narrative   Not on file   Social Determinants of Health   Financial Resource Strain: Not on file  Food Insecurity: Not on file  Transportation Needs: Not on file  Physical Activity: Not on file  Stress: Not on file  Social Connections: Not on file   Allergies  Allergen Reactions   Gabapentin Other (See Comments)    dizziness   Statins Other (See Comments)    Muscle weakness   Family History  Problem Relation Age of Onset   Heart disease Mother    Liver disease Father    Dementia Sister    Colon polyps Brother    Colon cancer Brother       Current Outpatient Medications (Cardiovascular):    amLODipine (NORVASC) 10 MG tablet, TAKE 1/2 TABLET BY MOUTH EVERY DAY   ezetimibe (ZETIA) 10 MG tablet, Take 5 mg by mouth daily.   LIVALO 4 MG TABS, TAKE 1/2TABLET DAILY   Current Outpatient Medications (Respiratory):    Tiotropium Bromide-Olodaterol (STIOLTO RESPIMAT) 2.5-2.5 MCG/ACT AERS, Inhale 2 puffs into the lungs daily.   Facility-Administered Medications Ordered in Other Visits (Respiratory):    levalbuterol (XOPENEX) nebulizer solution 0.63 mg  Current Outpatient Medications (Analgesics):    aspirin EC 81 MG tablet, Take 1 tablet (81 mg total) by mouth daily. Swallow whole.     Current Outpatient Medications (Other):    ALPRAZolam (XANAX) 0.5 MG tablet, TAKE 1 TABLET BY MOUTH EVERYDAY AT BEDTIME   calcium carbonate (TUMS - DOSED IN MG ELEMENTAL CALCIUM) 500 MG chewable tablet, Chew 1 tablet by mouth daily as needed for indigestion or heartburn.   fish oil-omega-3 fatty acids 1000 MG capsule, Take 1 g by mouth every evening.   Multiple Vitamins-Minerals (CENTRUM PO), Take 1 tablet by mouth every evening.   pantoprazole (PROTONIX) 40 MG tablet, TAKE 1 TABLET BY  MOUTH ONCE DAILY 1/2 HOUR BEFORE A  MEAL   polyethylene glycol (MIRALAX / GLYCOLAX) 17 g packet, Take 17 g by mouth daily.   Facility-Administered Medications Ordered in Other Visits (Other):    0.9 %  sodium chloride infusion   sodium chloride flush (NS) 0.9 % injection 3 mL   sodium chloride flush (NS) 0.9 % injection 3 mL   sodium chloride flush (NS) 0.9 % injection 3 mL No current facility-administered medications for this visit.   Reviewed prior external information including notes and imaging from  primary care provider As well as notes that were available from care everywhere and other healthcare systems.  Past medical history, social, surgical and family history all reviewed in electronic medical record.  No pertanent information unless stated regarding to the chief complaint.   Review of Systems:  No headache, visual changes, nausea, vomiting, diarrhea, constipation, dizziness, abdominal pain, skin rash, fevers, chills, night sweats, weight loss, swollen lymph nodes, body aches, joint swelling, chest pain, shortness of breath, mood changes. POSITIVE muscle aches  Objective  Blood pressure 118/72, pulse 67, height 5\' 10"  (1.778 m), weight 176 lb (79.8 kg), SpO2 98 %.   General: No apparent distress alert and oriented x3 mood and affect normal, dressed appropriately.  Patient is very hard of hearing HEENT: Pupils equal, extraocular movements intact  Respiratory: Patient's speak in full sentences and does not appear short of breath  Cardiovascular: No lower extremity edema, non tender, no erythema  Gait normal with good balance and coordination.  MSK: Patient does have some mild swelling still noted over the soft tissues of the anterior lateral aspect of the ankle.  Mild tenderness in the area.  No skin changes noted.  Limited muscular skeletal ultrasound was performed and interpreted by Hulan Saas, M  Limited ultrasound of patient's right ankle area in the area of question  has still some mild hypoechoic changes but is now only measuring approximately 1 cm in length compared to the 3 cm previously.  No abnormal vascularity noted.  No cortical defect of the underlying bone.  Impression: Interval improvement of hematoma   Impression and Recommendations:     The above documentation has been reviewed and is accurate and complete Lyndal Pulley, DO

## 2021-11-30 ENCOUNTER — Ambulatory Visit: Payer: Medicare Other | Admitting: Family Medicine

## 2021-11-30 ENCOUNTER — Other Ambulatory Visit: Payer: Self-pay

## 2021-11-30 ENCOUNTER — Encounter: Payer: Self-pay | Admitting: Family Medicine

## 2021-11-30 ENCOUNTER — Ambulatory Visit: Payer: Self-pay

## 2021-11-30 VITALS — BP 118/72 | HR 67 | Ht 70.0 in | Wt 176.0 lb

## 2021-11-30 DIAGNOSIS — S9001XA Contusion of right ankle, initial encounter: Secondary | ICD-10-CM | POA: Diagnosis not present

## 2021-11-30 NOTE — Patient Instructions (Signed)
Good to see you! ?Looks like its keeps getting better ?See you again in 2-3 months if necessary ?

## 2021-11-30 NOTE — Assessment & Plan Note (Signed)
Patient does have decrease in size of the hematoma noted today.  We discussed with patient that he continues to hit it and he needs to try to keep it covered at the moment.  We discussed again about the possible aspiration which patient declined.  We also discussed the possible more advanced imaging which he also declined with him continuing to make improvement.  Follow-up with me again in 2 to 3 months. ?

## 2022-01-06 ENCOUNTER — Other Ambulatory Visit: Payer: Self-pay

## 2022-01-06 ENCOUNTER — Ambulatory Visit (HOSPITAL_COMMUNITY)
Admission: RE | Admit: 2022-01-06 | Discharge: 2022-01-06 | Disposition: A | Discharge status: Home / Self Care | Payer: Medicare Other | Source: Ambulatory Visit | Attending: Physician Assistant | Admitting: Physician Assistant

## 2022-01-06 ENCOUNTER — Inpatient Hospital Stay: Payer: Medicare Other | Attending: Internal Medicine

## 2022-01-06 DIAGNOSIS — C3412 Malignant neoplasm of upper lobe, left bronchus or lung: Secondary | ICD-10-CM | POA: Insufficient documentation

## 2022-01-06 DIAGNOSIS — N189 Chronic kidney disease, unspecified: Secondary | ICD-10-CM | POA: Insufficient documentation

## 2022-01-06 DIAGNOSIS — Z79899 Other long term (current) drug therapy: Secondary | ICD-10-CM | POA: Insufficient documentation

## 2022-01-06 LAB — CBC WITH DIFFERENTIAL (CANCER CENTER ONLY)
Abs Immature Granulocytes: 0.03 10*3/uL (ref 0.00–0.07)
Basophils Absolute: 0.1 10*3/uL (ref 0.0–0.1)
Basophils Relative: 1 %
Eosinophils Absolute: 0.2 10*3/uL (ref 0.0–0.5)
Eosinophils Relative: 2 %
HCT: 36.8 % — ABNORMAL LOW (ref 39.0–52.0)
Hemoglobin: 12.1 g/dL — ABNORMAL LOW (ref 13.0–17.0)
Immature Granulocytes: 0 %
Lymphocytes Relative: 30 %
Lymphs Abs: 2.7 10*3/uL (ref 0.7–4.0)
MCH: 31.4 pg (ref 26.0–34.0)
MCHC: 32.9 g/dL (ref 30.0–36.0)
MCV: 95.6 fL (ref 80.0–100.0)
Monocytes Absolute: 0.8 10*3/uL (ref 0.1–1.0)
Monocytes Relative: 9 %
Neutro Abs: 5.4 10*3/uL (ref 1.7–7.7)
Neutrophils Relative %: 58 %
Platelet Count: 342 10*3/uL (ref 150–400)
RBC: 3.85 MIL/uL — ABNORMAL LOW (ref 4.22–5.81)
RDW: 12.7 % (ref 11.5–15.5)
WBC Count: 9.2 10*3/uL (ref 4.0–10.5)
nRBC: 0 % (ref 0.0–0.2)

## 2022-01-06 LAB — CMP (CANCER CENTER ONLY)
ALT: 15 U/L (ref 0–44)
AST: 21 U/L (ref 15–41)
Albumin: 4.2 g/dL (ref 3.5–5.0)
Alkaline Phosphatase: 89 U/L (ref 38–126)
Anion gap: 7 (ref 5–15)
BUN: 30 mg/dL — ABNORMAL HIGH (ref 8–23)
CO2: 24 mmol/L (ref 22–32)
Calcium: 9.2 mg/dL (ref 8.9–10.3)
Chloride: 109 mmol/L (ref 98–111)
Creatinine: 1.92 mg/dL — ABNORMAL HIGH (ref 0.61–1.24)
GFR, Estimated: 35 mL/min — ABNORMAL LOW (ref >60)
Glucose, Bld: 93 mg/dL (ref 70–99)
Potassium: 4.1 mmol/L (ref 3.5–5.1)
Sodium: 140 mmol/L (ref 135–145)
Total Bilirubin: 0.7 mg/dL (ref 0.3–1.2)
Total Protein: 7.6 g/dL (ref 6.5–8.1)

## 2022-01-09 ENCOUNTER — Other Ambulatory Visit: Payer: Self-pay

## 2022-01-09 ENCOUNTER — Inpatient Hospital Stay: Payer: Medicare Other | Admitting: Internal Medicine

## 2022-01-09 VITALS — BP 116/66 | HR 51 | Temp 97.4°F | Resp 19 | Ht 70.0 in | Wt 175.5 lb

## 2022-01-09 DIAGNOSIS — Z79899 Other long term (current) drug therapy: Secondary | ICD-10-CM | POA: Diagnosis not present

## 2022-01-09 DIAGNOSIS — N189 Chronic kidney disease, unspecified: Secondary | ICD-10-CM | POA: Diagnosis not present

## 2022-01-09 DIAGNOSIS — C3412 Malignant neoplasm of upper lobe, left bronchus or lung: Secondary | ICD-10-CM | POA: Diagnosis present

## 2022-01-09 DIAGNOSIS — C3490 Malignant neoplasm of unspecified part of unspecified bronchus or lung: Secondary | ICD-10-CM | POA: Diagnosis not present

## 2022-01-09 NOTE — Progress Notes (Signed)
?    [facility] ?[phone]   [phone] ? ?OFFICE PROGRESS NOTE ? ?Nathaniel Panda, MD ?[address] ?[place] [address] ? ?DIAGNOSIS: stage IA (T1a, N0, M0) non-small cell lung cancer, adenocarcinoma presented with left upper lobe lung nodule. ? ?PRIOR THERAPY: Status post left upper lobectomy with lymph node dissection on September 19, 2017 under the care of Dr. Roxan Hockey. ? ?CURRENT THERAPY: Observation. ? ?INTERVAL HISTORY: ?JAYDRIEN Long 81 y.o. male returns to the clinic today for annual follow-up visit accompanied by his wife.  The patient has severe hearing deficit.  His wife was able to contribute to the encounter today.  He denied having any current chest pain, shortness of breath, cough or hemoptysis.  He denied having any nausea, vomiting, diarrhea or constipation.  He has no headache or visual changes.  He has no recent weight loss or night sweats.  He is here today for evaluation and repeat CT scan of the chest for restaging of his disease. ? ? ?MEDICAL HISTORY: ?Past Medical History:  ?Diagnosis Date  ? Anxiety   ? Cancer [facility])   ? lung  ? Carotid artery stenosis   ? Cataract   ? Chronic lower back pain   ? CKD (chronic kidney disease)   ? Complication of anesthesia   ? "can't pee when I come out of OR" (07/18/2017)  ? Contact with chainsaw as cause of accidental injury 07/16/2017  ? sternal fracture with bilateral anterior fifth and sixth rib fractures /notes 07/16/2017  ? COPD (chronic obstructive pulmonary disease) ([place])   ? Dyspnea   ? GERD (gastroesophageal reflux disease)   ? Hard of hearing   ? High cholesterol   ? Hypertension   ? Peripheral vascular disease ([place])   ? Thoracic ascending aortic aneurysm   ? ? ?ALLERGIES:  is allergic to gabapentin and statins. ? ?MEDICATIONS:  ?Current Outpatient Medications  ?Medication Sig Dispense Refill  ? ALPRAZolam (XANAX) 0.5 MG tablet TAKE 1 TABLET BY MOUTH EVERYDAY AT BEDTIME 90 tablet 3  ? amLODipine  (NORVASC) 10 MG tablet TAKE 1/2 TABLET BY MOUTH EVERY DAY 45 tablet 3  ? aspirin EC 81 MG tablet Take 1 tablet (81 mg total) by mouth daily. Swallow whole. 90 tablet 3  ? calcium carbonate (TUMS - DOSED IN MG ELEMENTAL CALCIUM) 500 MG chewable tablet Chew 1 tablet by mouth daily as needed for indigestion or heartburn.    ? ezetimibe (ZETIA) 10 MG tablet Take 5 mg by mouth daily.  3  ? fish oil-omega-3 fatty acids 1000 MG capsule Take 1 g by mouth every evening.    ? LIVALO 4 MG TABS TAKE 1/2TABLET DAILY 45 tablet 3  ? Multiple Vitamins-Minerals (CENTRUM PO) Take 1 tablet by mouth every evening.    ? pantoprazole (PROTONIX) 40 MG tablet TAKE 1 TABLET BY MOUTH ONCE DAILY 1/2 HOUR BEFORE A  MEAL 90 tablet 3  ? polyethylene glycol (MIRALAX / GLYCOLAX) 17 g packet Take 17 g by mouth daily. 14 each 0  ? Tiotropium Bromide-Olodaterol (STIOLTO RESPIMAT) 2.5-2.5 MCG/ACT AERS Inhale 2 puffs into the lungs daily. 4 g 0  ? ?No current facility-administered medications for this visit.  ? ?Facility-Administered Medications Ordered in Other Visits  ?Medication Dose Route Frequency Provider Last Rate Last Admin  ? 0.9 %  sodium chloride infusion  250 mL Intravenous PRN Gold, Wayne E, PA-C      ? levalbuterol (XOPENEX) nebulizer solution 0.63 mg  0.63 mg Nebulization  Q6H PRN Gaye Pollack, MD   0.63 mg at 09/29/17 1646  ? sodium chloride flush (NS) 0.9 % injection 3 mL  3 mL Intravenous Q12H Gold, Wayne E, PA-C      ? sodium chloride flush (NS) 0.9 % injection 3 mL  3 mL Intravenous Q12H Gold, Wayne E, PA-C      ? sodium chloride flush (NS) 0.9 % injection 3 mL  3 mL Intravenous PRN Jadene Pierini E, PA-C      ? ? ?SURGICAL HISTORY:  ?Past Surgical History:  ?Procedure Laterality Date  ? ABDOMINAL AORTOGRAM W/LOWER EXTREMITY N/A 05/01/2017  ? Procedure: Abdominal Aortogram w/Lower Extremity;  Surgeon: Adrian Prows, MD;  Location: [place] [facility];  Service: Cardiovascular;  Laterality: N/A;  ? BACK SURGERY    ? FINGER REPLANTATION  Left   ? "ring finger"  ? HEMORRHOID SURGERY    ? [place]  ? RENAL ANGIOGRAPHY Bilateral 12/18/2017  ? Procedure: RENAL ANGIOGRAPHY;  Surgeon: Nigel Mormon, MD;  Location: [place] [facility];  Service: Cardiovascular;  Laterality: Bilateral;  ? VIDEO ASSISTED THORACOSCOPY (VATS)/ LOBECTOMY Left 09/19/2017  ? Procedure: LEFT VIDEO ASSISTED THORACOSCOPY (VATS)/ LEFT UPPER LOBECTOMY, LYMPH NODE DISECTION;  Surgeon: Melrose Nakayama, MD;  Location: [place];  Service: Thoracic;  Laterality: Left;  ? VIDEO BRONCHOSCOPY WITH ENDOBRONCHIAL NAVIGATION N/A 09/05/2017  ? Procedure: VIDEO BRONCHOSCOPY WITH ENDOBRONCHIAL NAVIGATION;  Surgeon: Collene Gobble, MD;  Location: [place];  Service: Thoracic;  Laterality: N/A;  ? VIDEO BRONCHOSCOPY WITH ENDOBRONCHIAL ULTRASOUND N/A 09/05/2017  ? Procedure: VIDEO BRONCHOSCOPY WITH ENDOBRONCHIAL ULTRASOUND;  Surgeon: Collene Gobble, MD;  Location: [facility];  Service: Thoracic;  Laterality: N/A;  ? ? ?REVIEW OF SYSTEMS:  A comprehensive review of systems was negative.  ? ?PHYSICAL EXAMINATION: General appearance: alert, cooperative, and no distress ?Head: Normocephalic, without obvious abnormality, atraumatic ?Neck: no adenopathy, no JVD, supple, symmetrical, trachea midline, and thyroid not enlarged, symmetric, no tenderness/mass/nodules ?Lymph nodes: Cervical, supraclavicular, and axillary nodes normal. ?Resp: clear to auscultation bilaterally ?Back: symmetric, no curvature. ROM normal. No CVA tenderness. ?Cardio: regular rate and rhythm, S1, S2 normal, no murmur, click, rub or gallop ?GI: soft, non-tender; bowel sounds normal; no masses,  no organomegaly ?Extremities: extremities normal, atraumatic, no cyanosis or edema ? ?ECOG PERFORMANCE STATUS: 0 - Asymptomatic ? ?Blood pressure 116/66, pulse (!) 51, temperature (!) 97.4 ?F (36.3 ?C), temperature source Oral, resp. rate 19, height 5\' 10"  (1.778 m), weight 175 lb 8 oz (79.6 kg), SpO2 97 %. ? ?LABORATORY  DATA: ?Lab Results  ?Component Value Date  ? WBC 9.2 01/06/2022  ? HGB 12.1 (L) 01/06/2022  ? HCT 36.8 (L) 01/06/2022  ? MCV 95.6 01/06/2022  ? PLT 342 01/06/2022  ? ? ?  Chemistry   ?   ?Component Value Date/Time  ? NA 140 01/06/2022 1111  ? K 4.1 01/06/2022 1111  ? CL 109 01/06/2022 1111  ? CO2 24 01/06/2022 1111  ? BUN 30 (H) 01/06/2022 1111  ? CREATININE 1.92 (H) 01/06/2022 1111  ? CREATININE 1.17 02/21/2012 1046  ?    ?Component Value Date/Time  ? CALCIUM 9.2 01/06/2022 1111  ? ALKPHOS 89 01/06/2022 1111  ? AST 21 01/06/2022 1111  ? ALT 15 01/06/2022 1111  ? BILITOT 0.7 01/06/2022 1111  ?  ? ? ? ?RADIOGRAPHIC STUDIES: ?CT Chest Wo Contrast ? ?Result Date: 01/06/2022 ?CLINICAL DATA:  Non-small cell lung cancer, metastatic. Assess treatment response. Left upper lobe  lung cancer diagnosed in 2018 with surgical treatment. * Tracking Code: BO * EXAM: CT CHEST WITHOUT CONTRAST TECHNIQUE: Multidetector CT imaging of the chest was performed following the standard protocol without IV contrast. RADIATION DOSE REDUCTION: This exam was performed according to the departmental dose-optimization program which includes automated exposure control, adjustment of the mA and/or kV according to patient size and/or use of iterative reconstruction technique. COMPARISON:  Chest CT 01/10/2021 and 01/12/2020.  PET-CT 08/10/2017. FINDINGS: Cardiovascular: Again demonstrated is diffuse atherosclerosis of the aorta, great vessels and coronary arteries. There are calcifications of the aortic valve. The heart size is normal. There is no pericardial effusion. Mediastinum/Nodes: There are no enlarged mediastinal, hilar or axillary lymph nodes.Small mediastinal lymph nodes appear unchanged. Hilar assessment is limited by the lack of intravenous contrast, although the hilar contours appear unchanged. The thyroid gland, trachea and esophagus demonstrate no significant findings. Lungs/Pleura: No pleural effusion or pneumothorax. Stable volume loss  in the left hemithorax status post upper lobectomy. There is stable scarring in the left lower lobe. There is a stable 6 mm ground-glass nodule in the right lower lobe on image 94/5, without solid components. This ap

## 2022-01-11 NOTE — Progress Notes (Signed)
? ?Primary Physician/Referring:  [name], MD ? ?Patient ID: Nathaniel Long, male    DOB: 1941-02-19, 81 y.o.   MRN: [id number] ? ?Chief Complaint  ?Patient presents with  ? PAD  ? Hypertension  ? Hyperlipidemia  ? Bradycardia  ? Follow-up  ?  6 month  ? ?HPI:   ? ?Nathaniel Long  is a 81 y.o. 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 Angiograms could not explain his right hip pain and right leg pain and was recommended evaluation for pseudo-claudication. Patient with history of upper GI bleed in 06/2020.  ? ?Notably patient is hard of hearing despite use of hearing aides. ? ?Patient presents for 6 month follow up. At last office visit patient was stable from a cardiac standpoint therefore no changes were made.  Patient remains asymptomatic from a cardiovascular standpoint. GXT revealed chronotropic competence. Denies chest pain, dyspnea, dizziness, syncope, near syncope, claudication, palpitations.  ? ?Past Medical History:  ?Diagnosis Date  ? Anxiety   ? Cancer [facility])   ? lung  ? Carotid artery stenosis   ? Cataract   ? Chronic lower back pain   ? CKD (chronic kidney disease)   ? Complication of anesthesia   ? "can't pee when I come out of OR" (07/18/2017)  ? Contact with chainsaw as cause of accidental injury 07/16/2017  ? sternal fracture with bilateral anterior fifth and sixth rib fractures /notes 07/16/2017  ? COPD (chronic obstructive pulmonary disease) ([place])   ? Dyspnea   ? GERD (gastroesophageal reflux disease)   ? Hard of hearing   ? High cholesterol   ? Hypertension   ? Peripheral vascular disease ([place])   ? Thoracic  ascending aortic aneurysm ([place])   ? ?Past Surgical History:  ?Procedure Laterality Date  ? ABDOMINAL AORTOGRAM W/LOWER EXTREMITY N/A 05/01/2017  ? Procedure: Abdominal Aortogram w/Lower Extremity;  Surgeon: Adrian Prows, MD;  Location: [place] [facility];  Service: Cardiovascular;  Laterality: N/A;  ? BACK SURGERY    ? FINGER REPLANTATION Left   ? "ring finger"  ? HEMORRHOID SURGERY    ? [place]  ? RENAL ANGIOGRAPHY Bilateral 12/18/2017  ? Procedure: RENAL ANGIOGRAPHY;  Surgeon: Nigel Mormon, MD;  Location: [place] [facility];  Service: Cardiovascular;  Laterality: Bilateral;  ? VIDEO ASSISTED THORACOSCOPY (VATS)/ LOBECTOMY Left 09/19/2017  ? Procedure: LEFT VIDEO ASSISTED THORACOSCOPY (VATS)/ LEFT UPPER LOBECTOMY, LYMPH NODE DISECTION;  Surgeon: Melrose Nakayama, MD;  Location: [place];  Service: Thoracic;  Laterality: Left;  ? VIDEO BRONCHOSCOPY WITH ENDOBRONCHIAL NAVIGATION N/A 09/05/2017  ? Procedure: VIDEO BRONCHOSCOPY WITH ENDOBRONCHIAL NAVIGATION;  Surgeon: Collene Gobble, MD;  Location: [place];  Service: Thoracic;  Laterality: N/A;  ? VIDEO BRONCHOSCOPY WITH ENDOBRONCHIAL ULTRASOUND N/A 09/05/2017  ? Procedure: VIDEO BRONCHOSCOPY WITH ENDOBRONCHIAL ULTRASOUND;  Surgeon: Collene Gobble, MD;  Location: [facility];  Service: Thoracic;  Laterality: N/A;  ? ?Family History  ?Problem Relation Age of Onset  ? Heart disease Mother   ? Liver disease Father   ? Dementia Sister   ? Colon polyps Brother   ? Colon cancer Brother   ?  ?  Social History  ? ?Tobacco Use  ? Smoking status: Former  ?  Packs/day: 1.00  ?  Years: 33.00  ?  Pack years: 33.00  ?  Types: Cigarettes  ?  Quit date: 04/17/2017  ?  Years since quitting: 4.7  ? Smokeless tobacco: Never  ? Tobacco comments:  ?  May, 2018  ?Substance Use Topics  ? Alcohol use: Not Currently  ?  Comment: last drink 1995  ? ?Marital Status: Married  ? ?ROS  ?Review of Systems  ?Constitutional: Negative for malaise/fatigue and weight gain.   ?Cardiovascular:  Negative for chest pain, claudication, leg swelling, near-syncope, orthopnea, palpitations, paroxysmal nocturnal dyspnea and syncope.  ?Respiratory:  Negative for cough and shortness of breath.   ?Neurological:  Negative for dizziness.  ? ?Objective  ?Blood pressure (!) 118/55, pulse 70, temperature 98 ?F (36.7 ?C), temperature source Temporal, resp. rate 17, height '5\' 10"'  (1.778 m), weight 176 lb (79.8 kg), SpO2 96 %.  ? ?  01/12/2022  ?  1:13 PM 01/09/2022  ?  3:11 PM 11/30/2021  ?  3:57 PM  ?Vitals with BMI  ?Height '5\' 10"'  '5\' 10"'  '5\' 10"'   ?Weight 176 lbs 175 lbs 8 oz 176 lbs  ?BMI 25.25 25.18 25.25  ?Systolic 233 007 622  ?Diastolic 55 66 72  ?Pulse 70 51 67  ?  ? ? Physical Exam ?Vitals reviewed.  ?Cardiovascular:  ?   Rate and Rhythm: Regular rhythm. Bradycardia present.  ?   Pulses: Intact distal pulses.  ?   Heart sounds: S1 normal and S2 normal. Murmur heard.  ?Blowing holosystolic murmur is present with a grade of 2/6 at the apex.  ?  No gallop.  ?Pulmonary:  ?   Effort: Pulmonary effort is normal. No respiratory distress.  ?   Breath sounds: No rhonchi or rales.  ?Musculoskeletal:  ?   Right lower leg: No edema.  ?   Left lower leg: No edema.  ?Neurological:  ?   Mental Status: He is alert.  ?Physical exam unchanged compared to previous office visit. ? ?Laboratory examination:  ? ?Recent Labs  ?  01/06/22 ?1111  ?NA 140  ?K 4.1  ?CL 109  ?CO2 24  ?GLUCOSE 93  ?BUN 30*  ?CREATININE 1.92*  ?CALCIUM 9.2  ?GFRNONAA 35*  ? ?estimated creatinine clearance is 31.7 mL/min (A) (by C-G formula based on SCr of 1.92 mg/dL (H)).  ? ?  Latest Ref Rng & Units 01/06/2022  ? 11:11 AM 01/10/2021  ? 11:09 AM 06/19/2020  ?  8:16 AM  ?CMP  ?Glucose 70 - 99 mg/dL 93   99   104    ?BUN 8 - 23 mg/dL '30   22   26    ' ?Creatinine 0.61 - 1.24 mg/dL 1.92   1.69   1.53    ?Sodium 135 - 145 mmol/L 140   140   139    ?Potassium 3.5 - 5.1 mmol/L 4.1   4.4   4.0    ?Chloride 98 - 111 mmol/L 109   109   109    ?CO2 22 - 32 mmol/L  '24   21   21    ' ?Calcium 8.9 - 10.3 mg/dL 9.2   8.8   8.5    ?Total Protein 6.5 - 8.1 g/dL 7.6   7.4   6.4    ?Total Bilirubin 0.3 - 1.2 mg/dL 0.7   1.5   1.2    ?Alkaline Phos 38 - 126  U/L 89   66   54    ?AST 15 - 41 U/L 21   36   33    ?ALT 0 - 44 U/L 15   23   32    ? ? ?  Latest Ref Rng & Units 01/06/2022  ? 11:11 AM 01/10/2021  ? 11:09 AM 07/21/2020  ?  9:26 AM  ?CBC  ?WBC 4.0 - 10.5 K/uL 9.2   8.1   8.9    ?Hemoglobin 13.0 - 17.0 g/dL 12.1   13.7   12.0    ?Hematocrit 39.0 - 52.0 % 36.8   41.5   35.3    ?Platelets 150 - 400 K/uL 342   318   348.0    ? ? ?Lipid Panel ?No results for input(s): CHOL, TRIG, LDLCALC, VLDL, HDL, CHOLHDL, LDLDIRECT in the last 8760 hours. ? ? ?HEMOGLOBIN A1C ?Lab Results  ?Component Value Date  ? HGBA1C 6.0 02/21/2012  ? ?TSH ?No results for input(s): TSH in the last 8760 hours. ? ?External labs:  ?10/12/2020: ?Total cholesterol 117, triglycerides 79, HDL 41, LDL 60 ?Hemoglobin 13.4, hematocrit 41.0, MCV 94.4, platelets 317 ?Sodium 138, potassium 4.5, glucose 102, BUN 22, creatinine 1.51, GFR 43 ?A1c 6.7% ? ?07/02/2020:  ?Hemoglobin 11.1, hematocrit 33.2, RBC 3.46, platelet 160, CBC otherwise normal ?Sodium 137 potassium 4.9 BUN 15 creatinine 1.61, CMP otherwise normal ?EGFR 40 ? ?10/07/2015: ?HDL 35, LDL 99, total cholesterol 167, triglycerides 166 ? ?Allergies  ? ?Allergies  ?Allergen Reactions  ? Gabapentin Other (See Comments)  ?  dizziness  ? Statins Other (See Comments)  ?  Muscle weakness  ?  ?Medications Prior to Visit:  ? ?Outpatient Medications Prior to Visit  ?Medication Sig Dispense Refill  ? ALPRAZolam (XANAX) 0.5 MG tablet TAKE 1 TABLET BY MOUTH EVERYDAY AT BEDTIME 90 tablet 3  ? aspirin EC 81 MG tablet Take 1 tablet (81 mg total) by mouth daily. Swallow whole. 90 tablet 3  ? calcium carbonate (TUMS - DOSED IN MG ELEMENTAL CALCIUM) 500 MG chewable tablet Chew 1 tablet by mouth daily as needed for indigestion or heartburn.    ? ezetimibe (ZETIA) 10 MG tablet Take 5 mg by mouth  daily.  3  ? fish oil-omega-3 fatty acids 1000 MG capsule Take 1 g by mouth every evening.    ? LIVALO 4 MG TABS TAKE 1/2TABLET DAILY 45 tablet 3  ? Multiple Vitamins-Minerals (CENTRUM PO) Take 1 tablet by mouth ever

## 2022-01-12 ENCOUNTER — Other Ambulatory Visit: Payer: Self-pay | Admitting: Cardiology

## 2022-01-12 ENCOUNTER — Encounter: Payer: Self-pay | Admitting: Student

## 2022-01-12 ENCOUNTER — Ambulatory Visit: Payer: Medicare Other | Admitting: Student

## 2022-01-12 VITALS — BP 118/55 | HR 70 | Temp 98.0°F | Resp 17 | Ht 70.0 in | Wt 176.0 lb

## 2022-01-12 DIAGNOSIS — F411 Generalized anxiety disorder: Secondary | ICD-10-CM

## 2022-01-12 DIAGNOSIS — F5101 Primary insomnia: Secondary | ICD-10-CM

## 2022-01-12 DIAGNOSIS — I739 Peripheral vascular disease, unspecified: Secondary | ICD-10-CM

## 2022-01-12 DIAGNOSIS — R001 Bradycardia, unspecified: Secondary | ICD-10-CM

## 2022-01-12 MED ORDER — AMLODIPINE BESYLATE 5 MG PO TABS: 5.0000 mg | ORAL_TABLET | Freq: Every day | ORAL | 3 refills | Status: DC

## 2022-01-13 NOTE — Telephone Encounter (Signed)
Refill request

## 2022-01-16 ENCOUNTER — Other Ambulatory Visit: Payer: Self-pay | Admitting: Cardiology

## 2022-01-16 DIAGNOSIS — F411 Generalized anxiety disorder: Secondary | ICD-10-CM

## 2022-01-16 DIAGNOSIS — F5101 Primary insomnia: Secondary | ICD-10-CM

## 2022-01-17 ENCOUNTER — Other Ambulatory Visit: Payer: Self-pay | Admitting: Cardiology

## 2022-01-17 DIAGNOSIS — F5101 Primary insomnia: Secondary | ICD-10-CM

## 2022-01-17 DIAGNOSIS — F411 Generalized anxiety disorder: Secondary | ICD-10-CM

## 2022-01-17 MED ORDER — ALPRAZOLAM 0.5 MG PO TABS: 0.5000 mg | ORAL_TABLET | Freq: Every evening | ORAL | 1 refills | Status: DC | PRN

## 2022-01-17 NOTE — Telephone Encounter (Signed)
Needs refill

## 2022-02-03 ENCOUNTER — Telehealth: Payer: Self-pay | Admitting: Pulmonary Disease

## 2022-02-06 NOTE — Telephone Encounter (Signed)
Called pt and there was no answer-LMTCB °

## 2022-02-22 ENCOUNTER — Other Ambulatory Visit: Payer: Self-pay | Admitting: Internal Medicine

## 2022-02-22 DIAGNOSIS — R634 Abnormal weight loss: Secondary | ICD-10-CM

## 2022-02-22 DIAGNOSIS — Z85118 Personal history of other malignant neoplasm of bronchus and lung: Secondary | ICD-10-CM

## 2022-02-22 DIAGNOSIS — N183 Chronic kidney disease, stage 3 unspecified: Secondary | ICD-10-CM

## 2022-02-23 ENCOUNTER — Other Ambulatory Visit: Payer: Self-pay | Admitting: Physician Assistant

## 2022-02-23 DIAGNOSIS — J387 Other diseases of larynx: Secondary | ICD-10-CM

## 2022-03-03 ENCOUNTER — Other Ambulatory Visit: Payer: Self-pay | Admitting: Otolaryngology

## 2022-03-08 ENCOUNTER — Other Ambulatory Visit: Payer: Medicare Other

## 2022-03-22 ENCOUNTER — Other Ambulatory Visit: Payer: Self-pay

## 2022-03-22 ENCOUNTER — Other Ambulatory Visit: Payer: Medicare Other

## 2022-03-22 ENCOUNTER — Telehealth: Payer: Self-pay | Admitting: *Deleted

## 2022-03-22 DIAGNOSIS — C140 Malignant neoplasm of pharynx, unspecified: Secondary | ICD-10-CM

## 2022-03-22 NOTE — Telephone Encounter (Signed)
CALLED PATIENT TO INFORM OF PET SCAN FOR 03-30-22- ARRIVAL TIME- 9:30 AM @ [facility], PATIENT TO HAVE WATER ONLY- 6 HRS. PRIOR TO TEST, SPOKE WITH PATIENT'S WIFE- [name] AND SHE IS AWARE OF THIS SCAN AND THE INSTRUCTIONS

## 2022-03-22 NOTE — Progress Notes (Signed)
Oncology Nurse Navigator Documentation   I called Mr. Reyez today to discuss recommendations from ENT conference this morning. I left a voice mail asking him to call me back with my direct number.  Harlow Asa RN, BSN, OCN Head & Neck Oncology Nurse [place] at [facility] Phone # [phone]  Fax # [phone]

## 2022-03-24 ENCOUNTER — Telehealth (HOSPITAL_COMMUNITY): Payer: Self-pay

## 2022-03-24 ENCOUNTER — Telehealth: Payer: Self-pay | Admitting: Hematology and Oncology

## 2022-03-28 ENCOUNTER — Encounter: Payer: Self-pay | Admitting: Radiation Oncology

## 2022-03-28 ENCOUNTER — Ambulatory Visit
Admission: RE | Admit: 2022-03-28 | Discharge: 2022-03-28 | Disposition: A | Discharge status: Home / Self Care | Payer: Medicare Other | Source: Ambulatory Visit | Attending: Radiation Oncology | Admitting: Radiation Oncology

## 2022-03-28 ENCOUNTER — Other Ambulatory Visit: Payer: Self-pay

## 2022-03-28 VITALS — BP 126/67 | HR 50 | Temp 97.6°F | Resp 18 | Ht 70.0 in | Wt 171.0 lb

## 2022-03-28 DIAGNOSIS — C140 Malignant neoplasm of pharynx, unspecified: Secondary | ICD-10-CM | POA: Insufficient documentation

## 2022-03-28 DIAGNOSIS — R1310 Dysphagia, unspecified: Secondary | ICD-10-CM | POA: Diagnosis not present

## 2022-03-28 DIAGNOSIS — Z79899 Other long term (current) drug therapy: Secondary | ICD-10-CM | POA: Insufficient documentation

## 2022-03-28 DIAGNOSIS — Z87891 Personal history of nicotine dependence: Secondary | ICD-10-CM | POA: Diagnosis not present

## 2022-03-28 DIAGNOSIS — H9190 Unspecified hearing loss, unspecified ear: Secondary | ICD-10-CM | POA: Insufficient documentation

## 2022-03-28 MED ORDER — OXYMETAZOLINE HCL 0.05 % NA SOLN
2.0000 | Freq: Once | NASAL | Status: AC
  Administered 2022-03-28: 2 via NASAL
  Filled 2022-03-28: qty 30

## 2022-03-29 NOTE — Telephone Encounter (Signed)
Pt saw ENT after he called the office and all was handled during that visit.

## 2022-03-30 ENCOUNTER — Telehealth: Payer: Self-pay | Admitting: Hematology and Oncology

## 2022-03-30 ENCOUNTER — Telehealth: Payer: Self-pay | Admitting: *Deleted

## 2022-03-30 ENCOUNTER — Encounter (HOSPITAL_COMMUNITY): Admission: RE | Admit: 2022-03-30 | Payer: Medicare Other | Source: Ambulatory Visit

## 2022-03-30 NOTE — Telephone Encounter (Signed)
R/s pt's new pt appt with Dr. Chryl Heck due to pt's PET scan having to be r/s. Spoke to pt's wife who is aware of new appt date and time.

## 2022-03-30 NOTE — Telephone Encounter (Signed)
Called patient to inform of Pet scan for 04-13-22- arrival time- 9:30 am @ [facility], patient to have water only- 6 hrs. prior to test, spoke with patient's wife- Horris Latino and she is aware of this scan

## 2022-04-11 ENCOUNTER — Ambulatory Visit: Payer: Medicare Other | Admitting: Hematology and Oncology

## 2022-04-11 ENCOUNTER — Other Ambulatory Visit: Payer: Medicare Other

## 2022-04-13 ENCOUNTER — Encounter (HOSPITAL_COMMUNITY)
Admission: RE | Admit: 2022-04-13 | Discharge: 2022-04-13 | Disposition: A | Discharge status: Home / Self Care | Payer: Medicare Other | Source: Ambulatory Visit | Attending: Radiation Oncology | Admitting: Radiation Oncology

## 2022-04-13 DIAGNOSIS — C140 Malignant neoplasm of pharynx, unspecified: Secondary | ICD-10-CM | POA: Diagnosis not present

## 2022-04-13 LAB — GLUCOSE, CAPILLARY: Glucose-Capillary: 88 mg/dL (ref 70–99)

## 2022-04-13 MED ORDER — FLUDEOXYGLUCOSE F - 18 (FDG) INJECTION
8.5100 | Freq: Once | INTRAVENOUS | Status: AC
  Administered 2022-04-13: 8.51 via INTRAVENOUS

## 2022-04-14 ENCOUNTER — Inpatient Hospital Stay: Payer: Medicare Other | Attending: Internal Medicine | Admitting: Hematology and Oncology

## 2022-04-14 ENCOUNTER — Other Ambulatory Visit: Payer: Self-pay

## 2022-04-14 ENCOUNTER — Encounter: Payer: Self-pay | Admitting: Hematology and Oncology

## 2022-04-14 ENCOUNTER — Inpatient Hospital Stay: Payer: Medicare Other

## 2022-04-14 VITALS — BP 129/70 | HR 53 | Temp 97.9°F | Resp 16 | Ht 70.0 in | Wt 169.0 lb

## 2022-04-14 DIAGNOSIS — C139 Malignant neoplasm of hypopharynx, unspecified: Secondary | ICD-10-CM | POA: Insufficient documentation

## 2022-04-14 DIAGNOSIS — Z79899 Other long term (current) drug therapy: Secondary | ICD-10-CM | POA: Insufficient documentation

## 2022-04-14 DIAGNOSIS — Z87891 Personal history of nicotine dependence: Secondary | ICD-10-CM | POA: Insufficient documentation

## 2022-04-14 DIAGNOSIS — C140 Malignant neoplasm of pharynx, unspecified: Secondary | ICD-10-CM

## 2022-04-14 NOTE — Progress Notes (Signed)
[place] NOTE  Patient Care Team: [name], MD as PCP - General (Internal Medicine)  CHIEF COMPLAINTS/PURPOSE OF CONSULTATION:  New diagnosis of hypopharyngeal cancer.  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  He was seen at [facility], plan is to present him in the ENT tumor board on Wednesday and follow-up regarding surgical recommendations.  He was also seen by Dr. Isidore Moos who discussed with him about 6 to 7 weeks of radiation.  Although he has a decent underlying performance status, I am not quite sure if he will be a good candidate for concurrent chemoradiation.  He also is hopeful of just undergoing radiation.  He understands that chemoradiation and surgery might give him the best benefit however radiation alone is also very reasonable at prolonging his quality and quantity of life and possibly achieving cure.    He is very hard of hearing which makes it extremely challenging to have a meaningful conversation with his daughter literally has to scream at him for him to hear anything.  Discussed that chemotherapy and radiation to challenging effects including but not limited to increased risk of infections, severe mucositis.  He would like to think about all his options and he will let our nurse navigator know with his decision.  For pain related to cancer, okay to take ibuprofen 600 mg a day as he is already taking.  He also has a prescription for tramadol. Weight loss, not apparent, he may have lost 4 pounds which is intentional according to his daughter-in-law.  We can continue to monitor this Thank you for consulting [place] the care of this patient.  Please not  hesitate to contact us with any additional questions or concerns.   HISTORY OF PRESENTING ILLNESS:  Nathaniel Long 81 y.o. male is here because of  SCC hypopharynx.  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  He was evaluated by ENT, had a laryngoscopy which revealed a mass in the posterior pharyngeal wall hypopharynx also appeared normal.    CT done June 2023 with no laryngeal mass, question abnormal appearance of upper cervical esophagus which may show mild thickening could be esophagitis.  No definitive adenopathy.  Single slightly prominent level 2 level 3 junction lymph node on the left with short axis dimension of 9 to 10 mm, bilateral carotid artery atherosclerotic disease with ICA stenosis of 50 to 70%.  Posterior wall pharyngeal biopsy showed invasive moderately differentiated squamous cell carcinoma with focal keratinization, p16 negative  Pet imaging done yesterday, no evidence of metastatic disease based on my read, left cervical lymphadenopathy does appear to be metabolically active along with posterior pharyngeal wall mass which we are already familiar with.  We will have to await formal radiology read.  He tells me that his pain in the throat is very tolerable, he takes her 2-3 200 mg of ibuprofen a day.  Her daughter in law who was present with him thinks he has not lost much weight may be about 4 pounds but that was intentional because he was eating less carbs and more protein.  Otherwise he stays very active, has  good performance status.  He is very hard of hearing daughter-in-law literally had to scream at him for him to be able to understand what is going on.  He tells me that he was hoping to just do radiation alone because he would like to have good quality of life as well.  He has underlying carotid artery stenosis, history of hypertension, thoracic ascending aortic aneurysm and  COPD.  Rest of the pertinent 10 point ROS reviewed and negative  MEDICAL HISTORY:  Past Medical History:  Diagnosis Date  . Anxiety   . Cancer ([place])    lung  . Carotid artery stenosis   . Cataract   . Chronic lower back pain   . CKD (chronic kidney disease)   . Complication of anesthesia    "can't pee when I come out of OR" (07/18/2017)  . Contact with chainsaw as cause of accidental injury 07/16/2017   sternal fracture with bilateral anterior fifth and sixth rib fractures /notes 07/16/2017  . COPD (chronic obstructive pulmonary disease) ([place])   . Dyspnea   . GERD (gastroesophageal reflux disease)   . Hard of hearing   . High cholesterol   . Hypertension   . Peripheral vascular disease ([place])   . Thoracic ascending aortic aneurysm [facility])     SURGICAL HISTORY: Past Surgical History:  Procedure Laterality Date  . ABDOMINAL AORTOGRAM W/LOWER EXTREMITY N/A 05/01/2017   Procedure: Abdominal Aortogram w/Lower Extremity;  Surgeon: Adrian Prows, MD;  Location: [facility];  Service: Cardiovascular;  Laterality: N/A;  . BACK SURGERY    . FINGER REPLANTATION Left    "ring finger"  . HEMORRHOID SURGERY    . POSTERIOR LUMBAR FUSION  1986  . RENAL ANGIOGRAPHY Bilateral 12/18/2017   Procedure: RENAL ANGIOGRAPHY;  Surgeon: Nigel Mormon, MD;  Location: [facility];  Service: Cardiovascular;  Laterality: Bilateral;  . VIDEO ASSISTED THORACOSCOPY (VATS)/ LOBECTOMY Left 09/19/2017   Procedure: LEFT VIDEO ASSISTED THORACOSCOPY (VATS)/ LEFT UPPER LOBECTOMY, LYMPH NODE DISECTION;  Surgeon: Melrose Nakayama, MD;  Location: [facility];  Service: Thoracic;  Laterality: Left;  Marland Kitchen VIDEO BRONCHOSCOPY WITH ENDOBRONCHIAL NAVIGATION N/A 09/05/2017   Procedure: VIDEO BRONCHOSCOPY WITH ENDOBRONCHIAL NAVIGATION;  Surgeon: Collene Gobble, MD;  Location: [facility];  Service: Thoracic;  Laterality: N/A;  . VIDEO BRONCHOSCOPY WITH ENDOBRONCHIAL ULTRASOUND N/A 09/05/2017   Procedure: VIDEO  BRONCHOSCOPY WITH ENDOBRONCHIAL ULTRASOUND;  Surgeon: Collene Gobble, MD;  Location: [facility];  Service: Thoracic;  Laterality: N/A;    SOCIAL HISTORY: Social History   Socioeconomic History  . Marital status: Married    Spouse name: Not on file  . Number of children: Not on file  . Years of education: Not on file  . Highest education level: Not on file  Occupational History  . Not on file  Tobacco Use  . Smoking status: Former    Packs/day: 1.00    Years: 33.00    Total pack years: 33.00    Types: Cigarettes    Quit date: 04/17/2017    Years since quitting: 4.9  . Smokeless tobacco: Never  . Tobacco comments:    May, 2018  Vaping Use  . Vaping Use: Never used  Substance and Sexual Activity  . Alcohol use: Not Currently    Comment: last drink 1995  . Drug use: No  . Sexual activity: Not Currently  Other Topics Concern  . Not on file  Social History Narrative  . Not on file   Social Determinants  of Health   Financial Resource Strain: Low Risk  (10/03/2017)   Overall Financial Resource Strain (CARDIA)   . Difficulty of Paying Living Expenses: Not hard at all  Food Insecurity: No Food Insecurity (10/03/2017)   Hunger Vital Sign   . Worried About Charity fundraiser in the Last Year: Never true   . Ran Out of Food in the Last Year: Never true  Transportation Needs: No Transportation Needs (10/03/2017)   PRAPARE - Transportation   . Lack of Transportation (Medical): No   . Lack of Transportation (Non-Medical): No  Physical Activity: Inactive (10/03/2017)   Exercise Vital Sign   . Days of Exercise per Week: 0 days   . Minutes of Exercise per Session: 0 min  Stress: No Stress Concern Present (10/03/2017)   [place]   . Feeling of Stress : Not at all  Social Connections: Somewhat Isolated (10/03/2017)   Social Connection and Isolation Panel [NHANES]   . Frequency of Communication with Friends and Family: Never    . Frequency of Social Gatherings with Friends and Family: More than three times a week   . Attends Religious Services: Never   . Active Member of Clubs or Organizations: No   . Attends Archivist Meetings: Never   . Marital Status: Married  Human resources officer Violence: Not At Risk (10/03/2017)   Humiliation, Afraid, Rape, and Kick questionnaire   . Fear of Current or Ex-Partner: No   . Emotionally Abused: No   . Physically Abused: No   . Sexually Abused: No    FAMILY HISTORY: Family History  Problem Relation Age of Onset  . Heart disease Mother   . Liver disease Father   . Dementia Sister   . Colon polyps Brother   . Colon cancer Brother     ALLERGIES:  is allergic to gabapentin and statins.  MEDICATIONS:  Current Outpatient Medications  Medication Sig Dispense Refill  . ALPRAZolam (XANAX) 0.5 MG tablet Take 1 tablet (0.5 mg total) by mouth at bedtime as needed for anxiety. 90 tablet 1  . amLODipine (NORVASC) 5 MG tablet Take 1 tablet (5 mg total) by mouth daily. 90 tablet 3  . aspirin EC 81 MG tablet Take 1 tablet (81 mg total) by mouth daily. Swallow whole. 90 tablet 3  . calcium carbonate (TUMS - DOSED IN MG ELEMENTAL CALCIUM) 500 MG chewable tablet Chew 1 tablet by mouth daily as needed for indigestion or heartburn.    . ezetimibe (ZETIA) 10 MG tablet Take 5 mg by mouth daily.  3  . fish oil-omega-3 fatty acids 1000 MG capsule Take 1 g by mouth every evening.    Marland Kitchen LIVALO 4 MG TABS TAKE 1/2TABLET DAILY 45 tablet 3  . metoprolol succinate (TOPROL-XL) 25 MG 24 hr tablet Take 1 tablet by mouth daily.    . Multiple Vitamins-Minerals (CENTRUM PO) Take 1 tablet by mouth every evening.    . pantoprazole (PROTONIX) 40 MG tablet TAKE 1 TABLET BY MOUTH ONCE DAILY 1/2 HOUR BEFORE A  MEAL 90 tablet 3  . polyethylene glycol (MIRALAX / GLYCOLAX) 17 g packet Take 17 g by mouth daily. 14 each 0  . Tiotropium Bromide-Olodaterol (STIOLTO RESPIMAT) 2.5-2.5 MCG/ACT AERS Inhale 2 puffs  into the lungs daily. 4 g 0   No current facility-administered medications for this visit.   Facility-Administered Medications Ordered in Other Visits  Medication Dose Route Frequency Provider Last Rate Last Admin  .  0.9 %  sodium chloride infusion  250 mL Intravenous PRN Gold, Wayne E, PA-C      . levalbuterol (XOPENEX) nebulizer solution 0.63 mg  0.63 mg Nebulization Q6H PRN Gaye Pollack, MD   0.63 mg at 09/29/17 1646  . sodium chloride flush (NS) 0.9 % injection 3 mL  3 mL Intravenous Q12H Gold, Wayne E, PA-C      . sodium chloride flush (NS) 0.9 % injection 3 mL  3 mL Intravenous Q12H Gold, Wayne E, PA-C      . sodium chloride flush (NS) 0.9 % injection 3 mL  3 mL Intravenous PRN Gold, Wayne E, PA-C         PHYSICAL EXAMINATION: ECOG PERFORMANCE STATUS: 0 - Asymptomatic  Vitals:   04/14/22 1018  BP: 129/70  Pulse: (!) 53  Resp: 16  Temp: 97.9 F (36.6 C)  SpO2: 99%   Filed Weights   04/14/22 1018  Weight: 169 lb (76.7 kg)    Physical Exam Constitutional:      Appearance: Normal appearance.  Cardiovascular:     Rate and Rhythm: Normal rate and regular rhythm.     Pulses: Normal pulses.     Heart sounds: Normal heart sounds.  Abdominal:     General: Abdomen is flat.     Palpations: Abdomen is soft.  Musculoskeletal:        General: No swelling.     Cervical back: Normal range of motion and neck supple. No rigidity.  Lymphadenopathy:     Cervical: No cervical adenopathy.  Skin:    General: Skin is warm and dry.  Neurological:     General: No focal deficit present.     Mental Status: He is alert.     LABORATORY DATA:  I have reviewed the data as listed Lab Results  Component Value Date   WBC 9.2 01/06/2022   HGB 12.1 (L) 01/06/2022   HCT 36.8 (L) 01/06/2022   MCV 95.6 01/06/2022   PLT 342 01/06/2022     Chemistry      Component Value Date/Time   NA 140 01/06/2022 1111   K 4.1 01/06/2022 1111   CL 109 01/06/2022 1111   CO2 24 01/06/2022 1111    BUN 30 (H) 01/06/2022 1111   CREATININE 1.92 (H) 01/06/2022 1111   CREATININE 1.17 02/21/2012 1046      Component Value Date/Time   CALCIUM 9.2 01/06/2022 1111   ALKPHOS 89 01/06/2022 1111   AST 21 01/06/2022 1111   ALT 15 01/06/2022 1111   BILITOT 0.7 01/06/2022 1111       RADIOGRAPHIC STUDIES: I have personally reviewed the radiological images as listed and agreed with the findings in the report. No results found.  All questions were answered. The patient knows to call the clinic with any problems, questions or concerns. I spent 60 minutes in the care of this patient including H and P, review of records, counseling and coordination of care.     Benay Pike, MD 04/14/2022 11:32 AM

## 2022-04-17 ENCOUNTER — Encounter (HOSPITAL_COMMUNITY): Payer: Self-pay | Admitting: Dentistry

## 2022-04-17 ENCOUNTER — Ambulatory Visit (INDEPENDENT_AMBULATORY_CARE_PROVIDER_SITE_OTHER): Payer: Medicare Other | Admitting: Dentistry

## 2022-04-17 VITALS — BP 149/57 | HR 48 | Temp 97.8°F

## 2022-04-17 DIAGNOSIS — C140 Malignant neoplasm of pharynx, unspecified: Secondary | ICD-10-CM | POA: Diagnosis not present

## 2022-04-17 DIAGNOSIS — K030 Excessive attrition of teeth: Secondary | ICD-10-CM

## 2022-04-17 DIAGNOSIS — K06020 Generalized gingival recession, unspecified: Secondary | ICD-10-CM

## 2022-04-17 DIAGNOSIS — F40232 Fear of other medical care: Secondary | ICD-10-CM | POA: Insufficient documentation

## 2022-04-17 DIAGNOSIS — Z01818 Encounter for other preprocedural examination: Secondary | ICD-10-CM

## 2022-04-17 DIAGNOSIS — K08109 Complete loss of teeth, unspecified cause, unspecified class: Secondary | ICD-10-CM

## 2022-04-17 DIAGNOSIS — K036 Deposits [accretions] on teeth: Secondary | ICD-10-CM | POA: Insufficient documentation

## 2022-04-17 NOTE — Patient Instructions (Signed)
[facility] DEPARTMENT OF DENTAL MEDICINE Dr. Lyla Jasek B. Lamika Connolly, D.M.D. Phone: [phone] Fax: [phone]        It was a pleasure seeing you today!  Please refer to the information below regarding your dental visit with us.  Please do not hesitate to give us a call if any questions or concerns come up after you leave.    Thank you for letting us provide care for you.  If there is anything we can do for you, please let us know.      RADIATION THERAPY AND INFORMATION REGARDING YOUR TEETH  XEROSTOMIA (dry mouth):  Your salivary glands may be in the field of radiation.  Radiation may include all or only part of your salivary glands.  This will cause your saliva to dry up, and you will have a dry mouth.  The dry mouth will be for the rest of your life unless your radiation oncologist tells you otherwise.   Your saliva has many functions: It wets your tongue for speaking. It coats your teeth and the inside of your mouth for easier movement. It helps with chewing and swallowing food. It helps clean away harmful acid and toxic products made by the germs in your mouth, therefore it helps prevent cavities. It kills some germs in your mouth and helps to prevent gum disease. It helps to carry flavor to your taste buds.  Once you have lost your saliva, you will be at higher risk for tooth decay and gum disease.     What can be done to help improve your mouth when there's not enough saliva? Your dentist may give a recommendation for CLoSYS.  It will not bring back all of your saliva but may bring back some of it.  Also, your saliva may be thick and ropy or white and foamy.  It will not feel like it use to feel. You will need to swish with water every time your mouth feels dry.  YOU CANNOT suck on any cough drops, mints, lemon drops, candy, vitamin C or any other products.  You cannot use anything other than water to make your mouth feel less dry.  If you want to  drink anything else, you have to drink it all at once and brush afterwards.  Be sure to discuss the details of your diet habits with your dentist or hygienist.   RADIATION CARIES:  This is decay (cavities) that happens very quickly  once your mouth is very dry due to radiation therapy.  Normally, cavities take six months to two years to become a problem.  When you have dry mouth, cavities may take as little as eight weeks to cause you a problem.    Dental check-ups every 2-4 months are necessary as long as you have a dry mouth.  Radiation caries typically, but not always, starts at your gum line where it is hard to see the cavity.  It is therefore also hard to fill these cavities adequately.  This high rate of cavities happens because your mouth no longer has saliva and therefore the acid made by the germs starts the decay process.  Whenever you eat anything the germs in your mouth change the food into acid.  The acid then burns a small hole in your tooth.  This small hole is the beginning of a cavity.  If this is not treated then it will grow bigger and become a cavity.  The way to avoid this hole getting bigger   is to use fluoride every evening as prescribed by your dentist following your radiation. NOTE:  You have to make sure that your teeth are very clean before you use the fluoride.  This fluoride in turn will strengthen your teeth and prepare them for another day of fighting acid. If you develop radiation caries many times, the damage is so large that you will have to have all your teeth removed.  This could be a big problem if some of these teeth are in the field of radiation.  Further details of why this could be a big problem will follow (see Osteoradionecrosis below).   DYSGEUSIA (loss of taste):  This happens to varying degrees once you've had radiation therapy to your jaw region.  Many times taste is not completely lost, but becomes limited.  The loss of taste is mostly due to radiation  affecting your taste buds.  However, if you have no saliva in your mouth to carry the flavor to your taste buds, it would be difficult for your taste buds to taste anything.  That is why using water or over-the-counter brand CLoSYS prior to meals and during meal times may help with some of the taste.  Keep in mind that taste generally returns very slowly over the course of several months or several years after radiation therapy.  Don't give up hope.   TRISMUS (limited jaw opening):  According to your radiation oncologist, your TMJ or jaw joints are going to be partially or fully in the field of radiation.  This means that over time the muscles that help you open and close your mouth may get stiff.  This will potentially result in your not being able to open your mouth wide enough or as wide as you can open it now.  Let me give you an example of how slowly this happens and how unaware people are of it: A gentlemen that had radiation therapy 2 years ago went back to his dentist complaining that "bananas were just too large for him to be able to fit them in between his teeth."  He was no longer able to open wide enough to bite into a banana.  This happened slowly and over a period of time.   What we do to try and prevent this:   Your dentist will probably give you a stack of sticks called a trismus exercise device.  This stack will help remind your muscles and your jaw joints to open up to the same distance every day.  Use these sticks every morning when you wake up, or according to the instructions given by your dentist.    You must use these sticks for at least one to two years after radiation therapy.  The reason for that is because it happens so slowly and keeps going on for about two years after radiation therapy.  Your hospital dentist will help you monitor your mouth opening and make sure that it's not getting smaller after radiation.      TRISMUS EXERCISES: Using the stack of sticks given to you by  your dentist, place the stack in your mouth and hold onto the other end for support. Leave the sticks in your mouth while holding the other end.  Allow 30 seconds for muscle stretching. Rest for a few seconds. Repeat 3-5 times. This exercise is recommended in the mornings and evenings unless otherwise instructed. The exercise should be done for a period of 2 YEARS after the end of radiation. Your maximum   jaw opening should be checked regularly at recall dental visits by your general dentist. You should report any changes, soreness, or difficulties encountered when doing the exercises to your dentist.   OSTEORADIONECROSIS (ORN):  This is a condition where your jaw bone after radiation therapy becomes very dry.  It has very little blood supply to keep it alive.  If you develop a cavity that turns into an abscess or an infection, then the jaw bone does not have enough blood supply to help fight the infection.  At this point it is very likely that the infection could cause the death of your jaw bone.  When you have dead bone it has to be removed.  Therefore, you might end up having to have surgery to remove part of your jaw bone, the part of the jaw bone that has been affected.     Healing is also a problem if you are to have surgery (like a tooth extraction) in the areas where the bone has had radiation therapy.  If you have surgery, you need more blood supply to heal which is not available.  When blood supply and oxygen are not available, there is a chance for the bone to die. Occasionally, ORN happens on its own with no obvious reason, but this is quite rare.  We believe that patients who continue to smoke and/or drink alcohol have a higher chance of having this problem. Once your jaw bone has had radiation therapy, if there are any remaining teeth in that area, it is not recommended to have them pulled unless your dentist or oral surgeon is aware of your history of radiation and believes it is safe.   The risks for ORN either from infection or spontaneously occurring (with no reason) are life long.      Questions?  Call our office during office hours at [phone].  

## 2022-04-17 NOTE — Progress Notes (Signed)
Department of Dental Medicine   Service Date:   04/17/2022  Patient Name:  Nathaniel Long Date of Birth:   10/29/1940 Medical Record Number: [id number]  Referring Provider:           Eppie Gibson, MD  OUTPATIENT CONSULTATION PLAN/RECOMMENDATIONS   ASSESSMENT: There are no current signs of acute odontogenic infection including abscess, edema or erythema, or suspicious lesion requiring biopsy.   Accretions on teeth, recession, attrition; several teeth w/ full-coverage restorations/crown & bridge work.  RECOMMENDATIONS: No dental intervention indicated prior to starting radiation at this time.  PLAN: Discuss case with medical team and coordinate treatment as needed. Follow-up after completion of radiation therapy; did not schedule this yet since his oncology tx plan is not finalized.  Will call to schedule appt once he has a simulation/start date.  Discussed in detail all treatment options and recommendations with the patient and patient is agreeable to the plan.    Thank you for consulting with [facility] and for the opportunity to participate in this patient's treatment.  Should you have any questions or concerns, please contact the [facility] at [phone].   04/17/2022   HISTORY OF PRESENT ILLNESS: Nathaniel Long is a very pleasant 81 y.o. male with history of carotid artery stenosis, chronic kidney disease, HTN, COPD, thoracic ascending aortic aneurysm, tobacco use (former smoker, cigarettes- quit in 2018), lung cancer, anxiety, GERD and hearing loss (wears hearing aids)  who was recently diagnosed with hypopharyngeal cancer and is anticipating head and neck radiation therapy.   Per progress note from radiation oncology visit with Nathaniel Long on 03/28/22: " Nathaniel Long is a 81 y.o. male who presented with the cc of ongoing left ear pain and throat pain to Nathaniel Long on 02/23/22. Laryngoscopy performed by Nathaniel Long during that visit revealed a mass on  the posterior pharyngeal wall noted as slightly friable in appearance. Normal vocal cord mobility was appreciated without vocal cord nodule, mass, polyp or tumor. Hypopharynx also appeared normal without mass, pooling of secretions or aspiration. (Nathaniel Long was also present for laryngoscopy).     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   The patient followed up with Nathaniel Long on 03/16/22 to discuss treatment options. Options including surgical resection with reconstruction and radiation treatment, vs primary chemoradiation were discussed with the patient. The patient will make a final decision following our consultation today and with medical oncology. Nathaniel Long also referred the patient to [facility] to discuss surgical management. " The patient presents today with his daughter-in-law for a medically necessary dental consultation as part of his pre-radiation work-up.  He is very hard of hearing, so his daughter-in-law helps communicate information to him.    DENTAL HISTORY: The patient reports that he no longer has a primary dentist that he sees regularly because his previous one retired.  He believes it has been a couple of years since he last saw a dentist.  He currently denies any dental/orofacial pain or sensitivity. Patient is able to manage oral secretions.  Patient endorses dysphagia and odynophagia.  Patient denies fever, rigors and malaise.   CHIEF COMPLAINT:  Here for a pre-head and neck radiation dental evaluation.   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   Right renal artery stenosis (HCC) 12/17/2017   Hydropneumothorax 09/28/2017   S/P lobectomy of lung 09/19/2017   Malignant neoplasm of upper lobe of left lung ([place]) 09/13/2017   Pulmonary nodule, left 09/05/2017   Mediastinal lymphadenopathy 09/05/2017   Multiple rib fractures 07/16/2017   Spondylolisthesis, lumbar region 06/18/2017   Right hip pain 05/23/2017   Chronic bilateral low back pain with bilateral sciatica 05/23/2017   Colon cancer screening 01/12/2017   Antiplatelet or antithrombotic long-term use 01/12/2017   PAD (peripheral artery disease) ([place]) 04/13/2011   Murmur, heart 12/08/2010   ACTINIC KERATOSIS, HEAD 07/14/2010   BRUISED ANKLE 07/14/2010   HYPERTENSION, BENIGN 11/19/2009   TOBACCO ABUSE 10/28/2009   EAR PAIN, LEFT 10/28/2009   Past Medical History:  Diagnosis Date   Anxiety    Cancer ([place])    lung   Carotid artery stenosis    Cataract    Chronic lower back pain    CKD (chronic kidney disease)    Complication of anesthesia    "can't pee when I come out of OR" (07/18/2017)   Contact with chainsaw as cause of accidental injury 07/16/2017   sternal fracture with bilateral anterior fifth and sixth rib fractures /notes 07/16/2017   COPD (chronic obstructive pulmonary disease) (HCC)    Dyspnea    GERD (gastroesophageal reflux disease)    Hard of hearing    High cholesterol    Hypertension    Peripheral vascular disease ([place])    Thoracic ascending aortic aneurysm ([place])    Past Surgical History:  Procedure Laterality Date   ABDOMINAL AORTOGRAM W/LOWER EXTREMITY N/A 05/01/2017   Procedure: Abdominal Aortogram w/Lower Extremity;  Surgeon: Adrian Prows, MD;  Location: [facility];  Service: Cardiovascular;  Laterality: N/A;   BACK SURGERY     FINGER REPLANTATION Left    "ring finger"   HEMORRHOID SURGERY     POSTERIOR LUMBAR FUSION  1986   RENAL ANGIOGRAPHY Bilateral  12/18/2017   Procedure: RENAL ANGIOGRAPHY;  Surgeon: Nigel Mormon, MD;  Location: [facility];  Service: Cardiovascular;  Laterality: Bilateral;   VIDEO ASSISTED THORACOSCOPY (VATS)/ LOBECTOMY Left 09/19/2017   Procedure: LEFT VIDEO ASSISTED THORACOSCOPY (VATS)/ LEFT UPPER LOBECTOMY, LYMPH NODE DISECTION;  Surgeon: Melrose Nakayama, MD;  Location: [facility];  Service: Thoracic;  Laterality: Left;   VIDEO BRONCHOSCOPY WITH ENDOBRONCHIAL NAVIGATION N/A 09/05/2017   Procedure: VIDEO BRONCHOSCOPY WITH ENDOBRONCHIAL NAVIGATION;  Surgeon: Collene Gobble, MD;  Location: [facility];  Service: Thoracic;  Laterality: N/A;   VIDEO BRONCHOSCOPY WITH ENDOBRONCHIAL ULTRASOUND N/A 09/05/2017   Procedure: VIDEO BRONCHOSCOPY WITH ENDOBRONCHIAL ULTRASOUND;  Surgeon: Collene Gobble, MD;  Location: [facility];  Service: Thoracic;  Laterality: N/A;   Allergies  Allergen Reactions   Gabapentin Other (See Comments)    dizziness   Statins Other (See Comments)    Muscle weakness   Current Outpatient Medications  Medication Sig Dispense Refill   ALPRAZolam (XANAX) 0.5 MG tablet Take 1 tablet (0.5 mg total) by mouth at bedtime as needed for anxiety. 90 tablet 1   amLODipine (NORVASC) 5 MG tablet Take 1 tablet (5 mg total) by mouth daily. 90 tablet 3   aspirin EC 81 MG tablet Take 1 tablet (81 mg total) by mouth daily. Swallow  whole. 90 tablet 3   calcium carbonate (TUMS - DOSED IN MG ELEMENTAL CALCIUM) 500 MG chewable tablet Chew 1 tablet by mouth daily as needed for indigestion or heartburn.     ezetimibe (ZETIA) 10 MG tablet Take 5 mg by mouth daily.  3   fish oil-omega-3 fatty acids 1000 MG capsule Take 1 g by mouth every evening.     LIVALO 4 MG TABS TAKE 1/2TABLET DAILY 45 tablet 3   metoprolol succinate (TOPROL-XL) 25 MG 24 hr tablet Take 1 tablet by mouth daily.     Multiple Vitamins-Minerals (CENTRUM PO) Take 1 tablet by mouth every evening.     pantoprazole (PROTONIX) 40 MG tablet TAKE 1 TABLET BY  MOUTH ONCE DAILY 1/2 HOUR BEFORE A  MEAL 90 tablet 3   polyethylene glycol (MIRALAX / GLYCOLAX) 17 g packet Take 17 g by mouth daily. 14 each 0   Tiotropium Bromide-Olodaterol (STIOLTO RESPIMAT) 2.5-2.5 MCG/ACT AERS Inhale 2 puffs into the lungs daily. 4 g 0   No current facility-administered medications for this visit.   Facility-Administered Medications Ordered in Other Visits  Medication Dose Route Frequency Provider Last Rate Last Admin   0.9 %  sodium chloride infusion  250 mL Intravenous PRN Gold, Wayne E, PA-C       levalbuterol (XOPENEX) nebulizer solution 0.63 mg  0.63 mg Nebulization Q6H PRN Gaye Pollack, MD   0.63 mg at 09/29/17 1646   sodium chloride flush (NS) 0.9 % injection 3 mL  3 mL Intravenous Q12H Gold, Wayne E, PA-C       sodium chloride flush (NS) 0.9 % injection 3 mL  3 mL Intravenous Q12H Gold, Wayne E, PA-C       sodium chloride flush (NS) 0.9 % injection 3 mL  3 mL Intravenous PRN Jadene Pierini E, PA-C        LABS: Lab Results  Component Value Date   WBC 9.2 01/06/2022   HGB 12.1 (L) 01/06/2022   HCT 36.8 (L) 01/06/2022   MCV 95.6 01/06/2022   PLT 342 01/06/2022      Component Value Date/Time   NA 140 01/06/2022 1111   K 4.1 01/06/2022 1111   CL 109 01/06/2022 1111   CO2 24 01/06/2022 1111   GLUCOSE 93 01/06/2022 1111   BUN 30 (H) 01/06/2022 1111   CREATININE 1.92 (H) 01/06/2022 1111   CREATININE 1.17 02/21/2012 1046   CALCIUM 9.2 01/06/2022 1111   GFRNONAA 35 (L) 01/06/2022 1111   GFRAA 50 (L) 06/19/2020 0816   GFRAA 43 (L) 01/12/2020 1429   Lab Results  Component Value Date   INR 0.99 09/17/2017   INR 1.1 (H) 08/01/2017   No results found for: "PTT"  Social History   Socioeconomic History   Marital status: Married    Spouse name: Not on file   Number of children: Not on file   Years of education: Not on file   Highest education level: Not on file  Occupational History   Not on file  Tobacco Use   Smoking status: Former     Packs/day: 1.00    Years: 33.00    Total pack years: 33.00    Types: Cigarettes    Quit date: 04/17/2017    Years since quitting: 5.0   Smokeless tobacco: Never   Tobacco comments:    May, 2018  Vaping Use   Vaping Use: Never used  Substance and Sexual Activity   Alcohol use: Not Currently    Comment:  last drink 1995   Drug use: No   Sexual activity: Not Currently  Other Topics Concern   Not on file  Social History Narrative   Not on file   Social Determinants of Health   Financial Resource Strain: Low Risk  (10/03/2017)   Overall Financial Resource Strain (CARDIA)    Difficulty of Paying Living Expenses: Not hard at all  Food Insecurity: No Food Insecurity (10/03/2017)   Hunger Vital Sign    Worried About Running Out of Food in the Last Year: Never true    Ran Out of Food in the Last Year: Never true  Transportation Needs: No Transportation Needs (10/03/2017)   PRAPARE - Hydrologist (Medical): No    Lack of Transportation (Non-Medical): No  Physical Activity: Inactive (10/03/2017)   Exercise Vital Sign    Days of Exercise per Week: 0 days    Minutes of Exercise per Session: 0 min  Stress: No Stress Concern Present (10/03/2017)   [employer]    Feeling of Stress : Not at all  Social Connections: Somewhat Isolated (10/03/2017)   Social Connection and Isolation Panel [NHANES]    Frequency of Communication with Friends and Family: Never    Frequency of Social Gatherings with Friends and Family: More than three times a week    Attends Religious Services: Never    Marine scientist or Organizations: No    Attends Archivist Meetings: Never    Marital Status: Married  Human resources officer Violence: Not At Risk (10/03/2017)   Humiliation, Afraid, Rape, and Kick questionnaire    Fear of Current or Ex-Partner: No    Emotionally Abused: No    Physically Abused: No    Sexually Abused:  No   Family History  Problem Relation Age of Onset   Heart disease Mother    Liver disease Father    Dementia Sister    Colon polyps Brother    Colon cancer Brother      REVIEW OF SYSTEMS:  Reviewed with the patient as per HPI. PSYCH:  [+] Dental phobia   VITAL SIGNS: BP (!) 149/57 (BP Location: Right Arm, Patient Position: Sitting, Cuff Size: Normal)   Pulse (!) 48   Temp 97.8 F (36.6 C) (Oral)    PHYSICAL EXAM: GENERAL:  Well-developed, comfortable and in no apparent distress. NEUROLOGICAL:  Alert and oriented to person, place and  time. EXTRAORAL:  Facial symmetry present without any edema or erythema.  No swelling or lymphadenopathy.  TMJ asymptomatic without clicks or crepitations.     Maximum Interincisal Opening:  37 mm INTRAORAL:  Soft tissues appear well-perfused and mucous membranes moist.  FOM and vestibules soft and not raised. Oral cavity without mass or lesion. No signs of infection, parulis, sinus tract, edema or erythema evident upon exam.   DENTAL EXAM: Hard tissue exam completed and charted.    OVERALL IMPRESSION:  Fair remaining dentition.       ORAL HYGIENE:  Fair     PERIODONTAL:  Pink, healthy gingival tissue with blunted papilla.   Areas of localized plaque and calculus accumulation. [+] Recession:  Generalized ENDODONTICS:   #7 has had previous RCT FIXED PROSTHODONTICS:  Full-coverage PFM crowns on #7, #8, #9, #12, #24 and #25.  #26, #28 and #28 are a 3-unit PFM bridge with missing tooth #26 as pontic and #27-#28 are retainers/abutments. OCCLUSION:  Unable to assess molar occlusion. Non-functional teeth:  #  18, #19, #32 OTHER FINDINGS:   [+] Attrition/wear:  #5 occlusal, #6 incisal [+] #23MIFL small chip limited to enamel   RADIOGRAPHIC EXAM:  PAN was exposed and interpreted.   NOTE:  Did not take Full Mouth Series on patient due to compliance; no severe decay or signs of acute infection were noted on PAN.  Condyles seated bilaterally in  fossas.  All visualized osseous structures appear WNL. Bilateral radiopacities adjacent to condyle consistent w/ pt's hearing aids.  #4, #18 & #32 appear supra-erupted; #32 drifting mesial.  On the left side there are a few small, scattered radiopaque regions adjacent to the spine- differentials include carotid artery calcifications, lymph node calcifications vs sialolith (pt does have hx of carotid artery stenosis). Missing teeth #'s 1, 2, 3, 13, 14, 15, 16, 17, 26, 29, 30 & 31.  Existing restorations/ crown & bridge work seen on #4, #7, #8, #9, #10, #12, #18, #19, #20, #21, #24, #25, #27, #28 & #32.  #7 has been previously endodontically treated.   ASSESSMENT:  1.  Pharyngeal cancer 2.  Pre-head and neck radiation dental exam 3.  Missing teeth 4.  Accretions on teeth 5.  Gingival recession, generalized 6.  Attrition/wear 7.   Dental phobia   PROCEDURES: The common and significant side effects of radiation therapy to the head and neck were explained and discussed with the patient.  The discussion included side effects of trismus (limited opening), dysgeusia (loss of taste), xerostomia (dry mouth), radiation caries and osteoradionecrosis of the jaw.  I also discussed the importance of maintaining optimal oral hygiene and oral health before, during and after radiation to decrease the risk of developing radiation cavities and the need for any surgery such as extractions after therapy.    Trismus appliance made using patient's baseline MIO which = 19 sticks.  Leta Speller, DAII demonstrated use of appliance.  Verbal and written postop instructions were given to the patient.   PLAN AND RECOMMENDATIONS: I discussed the risks, benefits, and complications of various scenarios with the patient in relationship to their medical and dental conditions, which included systemic infection or other serious issues such as osteoradionecrosis that could potentially occur either before, during or after their  anticipated radiation therapy if dental/oral concerns are not addressed.  I explained that if any chronic or acute dental/oral infection(s) are addressed and subsequently not maintained following medical optimization and recovery, their risk of the previously mentioned complications are just as high and could potentially occur postoperatively.  I explained all significant findings of the dental consultation with the patient including no clinical severe decay or any signs of infection (chronic or acute) and at this time there is no indication for dental intervention prior to starting radiation, however he does have several teeth with crown/bridge work, old fillings and tartar or calculus build-up, so recommended that he re-establishes care at an outside dental office after he finishes radiation/oncology treatment since his previous dentist has retired.  Discussed that this is very important in order to optimize them following radiation from a dental standpoint and to maintain optimal oral health and reduce the risk of radiation caries.  The patient verbalized understanding of all findings, discussion, and recommendations. We then discussed various treatment options to include no treatment or returning once more to our dental clinic once he finishes radiation for a follow-up visit and subsequently establishing routine dental care either at our clinic or at an outside dental office.  The patient verbalized understanding of treatment options, and elected  to return to our clinic after therapy for a follow-up visit and then revisit either returning for comprehensive care vs establishing care elsewhere.  Plan to discuss all findings and recommendations with medical team and coordinate future care as needed.   Follow-up after the completion of radiation therapy (date TBD- will call to schedule appointment once oncology treatment plan has been finalized). Call if any questions or concerns arise before next  visit.   All questions and concerns were invited and addressed.  The patient tolerated today's visit well and departed in stable condition. I spent in excess of 120 minutes during the conduct of this consultation and >50% of this time involved direct face-to-face encounter for counseling and/or coordination of the patient's care.   Yeoman Benson Norway, DMD

## 2022-04-18 NOTE — Progress Notes (Signed)
Oncology Nurse Navigator Documentation   I called and left a message with Marilynne Halsted asking for a call back. I would like to discuss his recent visit with Dr. Chryl Heck and his decision regarding radiation/chemotherapy. He has also seen an ENT at [facility] and I would like to know if surgery was an option for him? I left my direct contact information and asked for a return call as soon as possible.   Harlow Asa RN, BSN, OCN Head & Neck Oncology Nurse [place] at [facility] Phone # [phone]  Fax # [phone]

## 2022-04-24 ENCOUNTER — Ambulatory Visit
Admission: RE | Admit: 2022-04-24 | Discharge: 2022-04-24 | Disposition: A | Discharge status: Home / Self Care | Payer: Medicare Other | Source: Ambulatory Visit | Attending: Radiation Oncology | Admitting: Radiation Oncology

## 2022-04-24 ENCOUNTER — Other Ambulatory Visit: Payer: Self-pay

## 2022-04-24 ENCOUNTER — Ambulatory Visit
Admission: RE | Admit: 2022-04-24 | Discharge: 2022-04-24 | Disposition: A | Discharge status: Home / Self Care | Payer: Medicare Other | Source: Ambulatory Visit | Attending: Internal Medicine | Admitting: Internal Medicine

## 2022-04-24 VITALS — BP 111/82 | HR 54 | Temp 97.8°F | Resp 18 | Ht 70.0 in | Wt 168.8 lb

## 2022-04-24 DIAGNOSIS — Z79899 Other long term (current) drug therapy: Secondary | ICD-10-CM | POA: Insufficient documentation

## 2022-04-24 DIAGNOSIS — Z87891 Personal history of nicotine dependence: Secondary | ICD-10-CM | POA: Diagnosis not present

## 2022-04-24 DIAGNOSIS — C140 Malignant neoplasm of pharynx, unspecified: Secondary | ICD-10-CM | POA: Insufficient documentation

## 2022-04-24 LAB — CBC WITH DIFFERENTIAL (CANCER CENTER ONLY)
Abs Immature Granulocytes: 0.03 10*3/uL (ref 0.00–0.07)
Basophils Absolute: 0.1 10*3/uL (ref 0.0–0.1)
Basophils Relative: 1 %
Eosinophils Absolute: 0.2 10*3/uL (ref 0.0–0.5)
Eosinophils Relative: 2 %
HCT: 33.9 % — ABNORMAL LOW (ref 39.0–52.0)
Hemoglobin: 11.6 g/dL — ABNORMAL LOW (ref 13.0–17.0)
Immature Granulocytes: 0 %
Lymphocytes Relative: 24 %
Lymphs Abs: 2.3 10*3/uL (ref 0.7–4.0)
MCH: 31.7 pg (ref 26.0–34.0)
MCHC: 34.2 g/dL (ref 30.0–36.0)
MCV: 92.6 fL (ref 80.0–100.0)
Monocytes Absolute: 0.7 10*3/uL (ref 0.1–1.0)
Monocytes Relative: 7 %
Neutro Abs: 6.1 10*3/uL (ref 1.7–7.7)
Neutrophils Relative %: 66 %
Platelet Count: 450 10*3/uL — ABNORMAL HIGH (ref 150–400)
RBC: 3.66 MIL/uL — ABNORMAL LOW (ref 4.22–5.81)
RDW: 13.3 % (ref 11.5–15.5)
WBC Count: 9.3 10*3/uL (ref 4.0–10.5)
nRBC: 0 % (ref 0.0–0.2)

## 2022-04-24 LAB — CMP (CANCER CENTER ONLY)
ALT: 12 U/L (ref 0–44)
AST: 18 U/L (ref 15–41)
Albumin: 4 g/dL (ref 3.5–5.0)
Alkaline Phosphatase: 86 U/L (ref 38–126)
Anion gap: 6 (ref 5–15)
BUN: 31 mg/dL — ABNORMAL HIGH (ref 8–23)
CO2: 26 mmol/L (ref 22–32)
Calcium: 9.1 mg/dL (ref 8.9–10.3)
Chloride: 107 mmol/L (ref 98–111)
Creatinine: 1.52 mg/dL — ABNORMAL HIGH (ref 0.61–1.24)
GFR, Estimated: 46 mL/min — ABNORMAL LOW (ref >60)
Glucose, Bld: 104 mg/dL — ABNORMAL HIGH (ref 70–99)
Potassium: 3.9 mmol/L (ref 3.5–5.1)
Sodium: 139 mmol/L (ref 135–145)
Total Bilirubin: 0.7 mg/dL (ref 0.3–1.2)
Total Protein: 7.2 g/dL (ref 6.5–8.1)

## 2022-04-24 LAB — TSH: TSH: 3.03 u[IU]/mL (ref 0.350–4.500)

## 2022-04-24 MED ORDER — SODIUM CHLORIDE 0.9% FLUSH
10.0000 mL | Freq: Once | INTRAVENOUS | Status: AC
  Administered 2022-04-24: 10 mL via INTRAVENOUS

## 2022-04-24 NOTE — Progress Notes (Signed)
Oncology Nurse Navigator Documentation   To provide support, encouragement and care continuity, met with him before his CT SIM. He was accompanied by his daughter in law, Sharyn Lull.  He met with Dr. Isidore Moos and discussed chemotherapy. Sharyn Lull communicated with him via written text due to his extreme hearing difficulty. He was made aware that chemotherapy was recommended for the best chance to cure his cancer. He has elected to proceed with radiation only at this time.  He tolerated procedure without difficulty, denied questions/concerns.     I encouraged him/them to call me prior to 05/04/22 [place].   Harlow Asa RN, BSN, OCN Head & Neck Oncology Nurse [place] at [facility] Phone # [phone]  Fax # [phone]

## 2022-04-24 NOTE — Progress Notes (Signed)
Has armband been applied?  Yes.    Does patient have an allergy to IV contrast dye?: No.   Has patient ever received premedication for IV contrast dye?: No.   Date of lab work: April 24, 2022 BUN: 31 CR: 1.52 eGFR: 46  Does patient take metformin?: No.  IV site: wrist right, condition patent and no redness  Has IV site been added to flowsheet?  Yes.    BP 111/82 (BP Location: Right Arm, Patient Position: Sitting, Cuff Size: Normal)   Pulse (!) 54   Temp 97.8 F (36.6 C)   Resp 18   Ht '5\' 10"'  (1.778 m)   Wt 168 lb 12.8 oz (76.6 kg)   SpO2 98%   BMI 24.22 kg/m

## 2022-04-26 ENCOUNTER — Other Ambulatory Visit: Payer: Self-pay

## 2022-04-26 ENCOUNTER — Inpatient Hospital Stay: Payer: Medicare Other | Admitting: Licensed Clinical Social Worker

## 2022-04-26 DIAGNOSIS — C140 Malignant neoplasm of pharynx, unspecified: Secondary | ICD-10-CM

## 2022-04-26 NOTE — Progress Notes (Signed)
[name]  Initial Assessment   Nathaniel Long is a 81 y.o. year old male. CSW contacted daughter-in-law, Nathaniel Long, by phone as pt is hard of hearing. Clinical Social Work was referred by nurse navigator for assessment of psychosocial needs.   SDOH (Social Determinants of Health) assessments performed: Yes SDOH Interventions    Flowsheet Row Most Recent Value  SDOH Interventions   Food Insecurity Interventions Other (Comment)  [food pantry]       SDOH Screenings   Alcohol Screen: Not on file  Depression (IRS8-5): Not on file  Financial Resource Strain: Low Risk  (10/03/2017)   Overall Financial Resource Strain (CARDIA)    Difficulty of Paying Living Expenses: Not hard at all  Food Insecurity: Food Insecurity Present (04/26/2022)   Hunger Vital Sign    Worried About Running Out of Food in the Last Year: Sometimes true    Ran Out of Food in the Last Year: Not on file  Housing: Not on file  Physical Activity: Inactive (10/03/2017)   Exercise Vital Sign    Days of Exercise per Week: 0 days    Minutes of Exercise per Session: 0 min  Social Connections: Somewhat Isolated (10/03/2017)   Social Connection and Isolation Panel [NHANES]    Frequency of Communication with Friends and Family: Never    Frequency of Social Gatherings with Friends and Family: More than three times a week    Attends Religious Services: Never    Marine scientist or Organizations: No    Attends Archivist Meetings: Never    Marital Status: Married  Stress: No Stress Concern Present (10/03/2017)   [employer] of Stress : Not at all  Tobacco Use: Medium Risk (04/17/2022)   Patient History    Smoking Tobacco Use: Former    Smokeless Tobacco Use: Never    Passive Exposure: Not on file  Transportation Needs: No Transportation Needs (10/03/2017)   PRAPARE - Hydrologist (Medical): No     Lack of Transportation (Non-Medical): No     Distress Screen completed: No   Family/Social Information:  Housing Arrangement: patient lives with spouse, 2 adopted sons ages 77 & 68yo Family members/support persons in your life? Family- adult sons & daughter in-laws Transportation concerns: no  Employment: Retired.  Income source: Paediatric nurse concerns:  Yes, current concerns as the sons at home are not working so income is limited Type of concern: Building control surveyor access concerns: yes, costs Religious or spiritual practice: Not known Services Currently in place:    Coping/ Adjustment to diagnosis: Patient understands treatment plan and what happens next? CSW did not speak with pt. Daughter in law is aware Concerns about diagnosis and/or treatment:  Main concerns, per DIL, are not related to treatment but rather to family dynamic issues with the adopted sons Current coping skills/ strengths: Supportive family/friends     SUMMARY: Current SDOH Barriers:  Food cost concerns  Clinical Social Work Clinical Goal(s):  Patient will Careers adviser and other local food pantries as needed for food  Interventions: Informed patient/family of the support team roles and support services at [facility] Provided [place] contact information and encouraged patient to call with any questions or concerns    Follow Up Plan: Patient will contact CSW with any support or resource needs and Patient will come to support center when needing a bag of food Patient/family verbalizes understanding  of plan: Yes    Trinnity Breunig E Leani Myron, LCSW

## 2022-05-03 DIAGNOSIS — C140 Malignant neoplasm of pharynx, unspecified: Secondary | ICD-10-CM | POA: Diagnosis present

## 2022-05-04 ENCOUNTER — Ambulatory Visit
Admission: RE | Admit: 2022-05-04 | Discharge: 2022-05-04 | Disposition: A | Discharge status: Home / Self Care | Payer: Medicare Other | Source: Ambulatory Visit | Attending: Radiation Oncology | Admitting: Radiation Oncology

## 2022-05-04 ENCOUNTER — Other Ambulatory Visit: Payer: Self-pay

## 2022-05-04 ENCOUNTER — Inpatient Hospital Stay: Payer: Medicare Other | Attending: Internal Medicine | Admitting: Nutrition

## 2022-05-04 DIAGNOSIS — C140 Malignant neoplasm of pharynx, unspecified: Secondary | ICD-10-CM | POA: Diagnosis not present

## 2022-05-04 LAB — RAD ONC ARIA SESSION SUMMARY
Course Elapsed Days: 0
Plan Fractions Treated to Date: 1
Plan Prescribed Dose Per Fraction: 2 Gy
Plan Total Fractions Prescribed: 35
Plan Total Prescribed Dose: 70 Gy
Reference Point Dosage Given to Date: 2 Gy
Reference Point Session Dosage Given: 2 Gy
Session Number: 1

## 2022-05-04 NOTE — Progress Notes (Signed)
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  He is followed by Dr. Isidore Moos.  There are no plans for PEG placement.  Past medical history includes hypertension, COPD, chronic kidney disease, PVD. Patient is very hard of hearing.  Medications include Xanax, Tums, omega-3 fatty acids, Protonix, and MiraLAX.  Labs include glucose 104, BUN 31, creatinine 1.52.  Height: 5 feet 10 inches. Weight: 168 pounds 12.8 ounces July 24. Usual body weight: 180 pounds in January 2023. BMI: 24.22.  Patient reports he has no difficulty chewing or swallowing because he eats blenderized foods.  He states foods taste even better blenderized than they did separately. He does not know why he needs to see a dietitian.  He is concerned about the cost of the visit. He has lost 7% of his body weight over 7 months which is not significant but it is concerning. Reports drinking oral nutrition supplements several times a day.  Nutrition diagnosis:  Food and nutrition related knowledge deficit related to hypopharyngeal cancer and associated treatments as evidenced by no prior need for nutrition related information.  Intervention: Patient educated to consume small amounts of food more often. Focus on soft high-calorie high-protein foods.  Nutrition fact sheet provided. Continue to blenderized foods as needed. Continue oral nutrition supplements, 3 cartons daily.  Provided samples and coupons.  Monitoring, evaluation, goals: Patient will tolerate adequate calories and proteins to minimize weight loss during treatment.  Next visit: Thursday, August 10 after radiation therapy.  **Disclaimer: This note was dictated with voice recognition software. Similar sounding words can inadvertently be transcribed and this note may contain transcription errors which may not have been corrected upon publication of note.**

## 2022-05-05 ENCOUNTER — Other Ambulatory Visit: Payer: Self-pay | Admitting: Radiation Oncology

## 2022-05-05 ENCOUNTER — Ambulatory Visit
Admission: RE | Admit: 2022-05-05 | Discharge: 2022-05-05 | Disposition: A | Discharge status: Home / Self Care | Payer: Medicare Other | Source: Ambulatory Visit | Attending: Radiation Oncology | Admitting: Radiation Oncology

## 2022-05-05 ENCOUNTER — Other Ambulatory Visit: Payer: Self-pay

## 2022-05-05 DIAGNOSIS — C140 Malignant neoplasm of pharynx, unspecified: Secondary | ICD-10-CM | POA: Diagnosis not present

## 2022-05-05 LAB — RAD ONC ARIA SESSION SUMMARY
Course Elapsed Days: 1
Plan Fractions Treated to Date: 2
Plan Prescribed Dose Per Fraction: 2 Gy
Plan Total Fractions Prescribed: 35
Plan Total Prescribed Dose: 70 Gy
Reference Point Dosage Given to Date: 4 Gy
Reference Point Session Dosage Given: 2 Gy
Session Number: 2

## 2022-05-05 MED ORDER — LIDOCAINE VISCOUS HCL 2 % MT SOLN: OROMUCOSAL | 4 refills | Status: DC

## 2022-05-05 MED ORDER — SONAFINE EX EMUL
1.0000 | Freq: Two times a day (BID) | CUTANEOUS | Status: DC
  Administered 2022-05-05: 1 via TOPICAL

## 2022-05-05 NOTE — Progress Notes (Signed)
Pt here for patient teaching.    Pt given Radiation and You booklet, Managing Acute Radiation Side Effects for Head and Neck Cancer handout, skin care instructions, and Sonafine.    Reviewed areas of pertinence such as fatigue, hair loss in treatment field, mouth changes, skin changes, throat changes, earaches, and taste changes .   Pt able to give teach back of to pat skin, use unscented/gentle soap, and drink plenty of water,apply Sonafine bid, avoid applying anything to skin within 4 hours of treatment, and to use an electric razor if they must shave.   Pt needs reinforcement and verbalizes understanding of information given and will contact nursing with any questions or concerns.    [email or web address]

## 2022-05-08 ENCOUNTER — Other Ambulatory Visit: Payer: Self-pay

## 2022-05-08 ENCOUNTER — Ambulatory Visit: Payer: Medicare Other | Attending: Radiation Oncology

## 2022-05-08 ENCOUNTER — Ambulatory Visit
Admission: RE | Admit: 2022-05-08 | Discharge: 2022-05-08 | Disposition: A | Discharge status: Home / Self Care | Payer: Medicare Other | Source: Ambulatory Visit | Attending: Radiation Oncology | Admitting: Radiation Oncology

## 2022-05-08 DIAGNOSIS — R1313 Dysphagia, pharyngeal phase: Secondary | ICD-10-CM | POA: Diagnosis present

## 2022-05-08 DIAGNOSIS — C140 Malignant neoplasm of pharynx, unspecified: Secondary | ICD-10-CM | POA: Diagnosis not present

## 2022-05-08 LAB — RAD ONC ARIA SESSION SUMMARY
Course Elapsed Days: 4
Plan Fractions Treated to Date: 3
Plan Prescribed Dose Per Fraction: 2 Gy
Plan Total Fractions Prescribed: 35
Plan Total Prescribed Dose: 70 Gy
Reference Point Dosage Given to Date: 6 Gy
Reference Point Session Dosage Given: 2 Gy
Session Number: 3

## 2022-05-08 NOTE — Therapy (Signed)
OUTPATIENT SPEECH LANGUAGE PATHOLOGY SWALLOW EVALUATION   Patient Name: Nathaniel Long MRN: [id number] DOB:1941/07/06, 81 y.o., male Today's Date: 05/09/2022  PCP: [name], MD REFERRING PROVIDER: Eppie Gibson, MD   End of Session - 05/09/22 0900     Visit Number 1    Number of Visits 4    Date for SLP Re-Evaluation 08/06/22    SLP Start Time 56    SLP Stop Time  2542    SLP Time Calculation (min) 40 min    Activity Tolerance Other (comment)   limited due to pt hearing loss            Past Medical History:  Diagnosis Date   Anxiety    Cancer ([place])    lung   Carotid artery stenosis    Cataract    Chronic lower back pain    CKD (chronic kidney disease)    Complication of anesthesia    "can't pee when I come out of OR" (07/18/2017)   Contact with chainsaw as cause of accidental injury 07/16/2017   sternal fracture with bilateral anterior fifth and sixth rib fractures /notes 07/16/2017   COPD (chronic obstructive pulmonary disease) ([place])    Dyspnea    GERD (gastroesophageal reflux disease)    Hard of hearing    High cholesterol    Hypertension    Peripheral vascular disease ([place])    Thoracic ascending aortic aneurysm [facility])    Past Surgical History:  Procedure Laterality Date   ABDOMINAL AORTOGRAM W/LOWER EXTREMITY N/A 05/01/2017   Procedure: Abdominal Aortogram w/Lower Extremity;  Surgeon: Adrian Prows, MD;  Location: [place] [facility];  Service: Cardiovascular;  Laterality: N/A;   BACK SURGERY     FINGER REPLANTATION Left    "ring finger"   HEMORRHOID SURGERY     POSTERIOR LUMBAR FUSION  1986   RENAL ANGIOGRAPHY Bilateral 12/18/2017   Procedure: RENAL ANGIOGRAPHY;  Surgeon: Nigel Mormon, MD;  Location: [place] [facility];  Service: Cardiovascular;  Laterality: Bilateral;   VIDEO ASSISTED THORACOSCOPY (VATS)/ LOBECTOMY Left 09/19/2017   Procedure: LEFT VIDEO ASSISTED THORACOSCOPY (VATS)/ LEFT UPPER LOBECTOMY, LYMPH NODE DISECTION;  Surgeon:  Melrose Nakayama, MD;  Location: [place];  Service: Thoracic;  Laterality: Left;   VIDEO BRONCHOSCOPY WITH ENDOBRONCHIAL NAVIGATION N/A 09/05/2017   Procedure: VIDEO BRONCHOSCOPY WITH ENDOBRONCHIAL NAVIGATION;  Surgeon: Collene Gobble, MD;  Location: [place];  Service: Thoracic;  Laterality: N/A;   VIDEO BRONCHOSCOPY WITH ENDOBRONCHIAL ULTRASOUND N/A 09/05/2017   Procedure: VIDEO BRONCHOSCOPY WITH ENDOBRONCHIAL ULTRASOUND;  Surgeon: Collene Gobble, MD;  Location: [place];  Service: Thoracic;  Laterality: N/A;   Patient Active Problem List   Diagnosis Date Noted   Encounter for preoperative dental examination 04/17/2022   Teeth missing 04/17/2022   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   Accretions on teeth 04/17/2022   Generalized gingival recession 04/17/2022   Excessive dental attrition 04/17/2022   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[place]) 03/28/2022   Hematoma of right ankle 10/28/2021   GI bleed 06/18/2020   COVID-19 virus infection 06/18/2020   Abdominal pain 06/18/2020   CKD (chronic kidney disease) stage 3, GFR 30-59 ml/min ([place]) 06/18/2020   COPD (chronic obstructive pulmonary disease) ([place]) 06/18/2020   Hard of hearing 06/18/2020   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   Right renal artery stenosis ([place]) 12/17/2017   Hydropneumothorax 09/28/2017   S/P lobectomy of lung 09/19/2017   Malignant neoplasm of upper lobe of left lung ([place]) 09/13/2017   Pulmonary nodule, left  09/05/2017   Mediastinal lymphadenopathy 09/05/2017   Multiple rib fractures 07/16/2017   Spondylolisthesis, lumbar region 06/18/2017   Right hip pain 05/23/2017   Chronic bilateral low back pain with bilateral sciatica 05/23/2017   Colon cancer screening 01/12/2017   Antiplatelet or antithrombotic long-term use 01/12/2017   PAD (peripheral artery disease) ([place]) 04/13/2011   Murmur, heart 12/08/2010   ACTINIC KERATOSIS, HEAD 07/14/2010   BRUISED ANKLE 07/14/2010   HYPERTENSION, BENIGN 11/19/2009   TOBACCO ABUSE  10/28/2009   EAR PAIN, LEFT 10/28/2009    ONSET DATE: Approx 12 months   REFERRING DIAG: 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[place])  C14.0  THERAPY DIAG:  Dysphagia, pharyngeal phase  Rationale for Evaluation and Treatment Rehabilitation  SUBJECTIVE:   SUBJECTIVE STATEMENT: "Told my doctor I had trouble for a year - took it that long to get seen." Pt accompanied by:  Nathaniel Long -Daughter in [place]: "BENCE TRAPP is a 81 y.o. male who presented with the cc of ongoing left ear pain and throat pain to Dr. West Carbo on 02/23/22. Laryngoscopy performed by Dr. West Carbo during that visit revealed a mass on the posterior pharyngeal wall noted as slightly friable in appearance. Normal vocal cord mobility was appreciated without vocal cord nodule, mass, polyp or tumor. Hypopharynx also appeared normal without mass, pooling of secretions or aspiration.CT neck/thyroid w cont 03-07-22 showed:  No evidence of laryngeal mass. Question abnormal appearance of the  upper cervical esophagus, which may show mild thickening with adjacent edema, which could go along with esophagitis. No definite adenopathy. Single slightly prominent level 2 level 3 junction lymph node on the left with short axis dimension of 9-10 mm.  Bilateral carotid artery atherosclerotic disease with ICA stenoses  of 50-70%, not precisely evaluated using this technique. 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 The patient followed up with Dr. Redmond Baseman on 03/16/22 to discuss treatment options. Options including surgical resection with reconstruction and radiation treatment, vs primary chemoradiation were discussed with the patient. The patient will make a final decision following our consultation today and with medical oncology. Dr. Redmond Baseman also referred the patient to [facility] to discuss surgical management - pt chose radiation.  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 Recent chest CT showed no evidence of local recurrence or metastatic disease. " Pt began radiation on Thursday 05/04/22.  PAIN:  Are you having pain? No  FALLS: Has patient fallen in last 6 months?  No  LIVING ENVIRONMENT: Lives with: lives with their spouse Lives in: House/apartment  PLOF:  Level of assistance: Independent with ADLs Employment: Retired   PATIENT GOALS Maintain swallow function  OBJECTIVE:   COGNITION: Overall cognitive status: Within functional limits for tasks assessed  ORAL MOTOR EXAMINATION Overall status: WFL  CLINICAL SWALLOW ASSESSMENT:   Current diet: Dysphagia 2 (chopped/minced), Dysphagia 1 (puree), and thin liquids Patient directly observed with POs: Yes: dysphagia 1 (puree) and thin liquids  Feeding: able to feed self Liquids provided by: cup Oral phase signs and symptoms:  none noted Pharyngeal phase signs and symptoms: multiple swallows, wet vocal quality, immediate throat clear, and complaints of residue SLP had pt turn his head to lt with applesauce and water and pt stated that strategy resulted in incr'd pharyngeal stasis. SLP then had pt turn head to rt with  applesauce and then water boluses and pt stated, "That was much easier." SLP confirmed that pt felt he had less pharyngeal stasis with those POs than when head straight ahead and pt confirmed that was the case. SLP told pt to turn head to rt with all POs, and that SLP was recommending modified barium swallow (MBSS) exam to assess pt's swallowing objectively.     TODAY'S TREATMENT:  Research states the risk for dysphagia increases due to radiation ttreatment due to a variety of factors, so SLP educated the pt about the possibility of reduced/limited ability for PO intake during rad tx. SLP also educated pt regarding possible  changes to swallowing musculature after rad tx, and why adherence to dysphagia HEP provided today and PO consumption was necessary to inhibit muscle fibrosis following rad tx and to mitigate muscle disuse atrophy. SLP informed pt why this would be detrimental to his swallowing status and to their pulmonary health. Pt demonstrated understanding of these things to SLP. SLP encouraged pt to safely eat and drink as deep into their radiation/chemotherapy as possible to provide the best possible long-term swallowing outcome for pt.    SLP then developed an individualized HEP for pt involving oral and pharyngeal strengthening and ROM and pt was instructed how to perform these exercises, including SLP demonstration. After SLP demonstration, pt return demonstrated each exercise. SLP ensured pt performance was correct prior to educating pt on next exercise. Pt is extremely hard of hearing so SLP used speech to text function on SLP's phone to ensure pt understanding. Pt required SBA to perform HEP. "Nathaniel Long" was instructed to complete this program 6-7 days/week, at least 2 times a day until 6 months after his last day of rad tx, and then x2 a week after that, indefinitely. Among other modifications for days when pt cannot functionally swallow, SLP also suggested pt cycle through the HEP so the full program of exercises can be completed instead of fatiguing on one of the swallowing exercises and being unable to perform the other swallowing exercises.    PATIENT EDUCATION: Education details: late effects head/neck radiation on swallow function, HEP procedure, and modification to HEP when difficulty experienced with swallowing during and after radiation course Person educated: Patient, daughter in [place] Education method: Explanation, Speech-to-text, Demonstration, Verbal cues, and Handouts Education comprehension: verbalized understanding, returned demonstration, verbal cues required, and needs further  education   ASSESSMENT:  CLINICAL IMPRESSION: Patient is a 81 y.o. male who was seen today for assessment of swallowing as he undergoes radiation therapy. Today pt ate applesauce, and drank thin liquids with overt s/s pharyngeal difficulty - SLP suspects bolus entry at least into laryngeal vestibule, and possibly/likely pharyngeal residue due to pt inability to manage secretions, and immediate throat clear with liquids. Even with pt deficits, there are no overt s/s aspiration PNA observed by SLP nor any reported by pt at this time. At this time pt swallowing is deemed [facility] with precautions listed in "pt instructions", however SLP is recommending modified barium swallow exam to assess pt's swallowing safety as he begins his rad tx. Data indicate that pt's swallow ability will likely decrease even more over the course of radiation therapy and could very well decline over time following the conclusion of that therapy due to muscle disuse atrophy and/or muscle fibrosis. Pt will cont to need to be seen by SLP in order to assess safety of PO intake, assess the need for recommending any objective swallow assessment, and ensuring pt is correctly completing  the individualized HEP.   OBJECTIVE IMPAIRMENTS include dysphagia. These impairments are limiting patient from safety when swallowing. Factors affecting potential to achieve goals and functional outcome are pre-radiation swallowing difficulty . Patient will benefit from skilled SLP services to address above impairments and improve overall function.   REHAB POTENTIAL: Good   GOALS: Goals reviewed with patient? No  SHORT TERM GOALS: Target date:  2 sessions (3rd total session)     pt will complete HEP with rare min A  Baseline: Goal status: INITIAL   2.  pt will tell SLP why pt is completing HEP with modified independence Baseline:  Goal status: INITIAL   3.  pt will describe 3 overt s/s aspiration PNA with modified independence Baseline:  Goal  status: INITIAL   4.  pt will tell SLP how a food journal could hasten return to a more normalized diet Baseline:  Goal status: INITIAL   LONG TERM GOALS: Target date:  6th session   (7th total session)  pt will complete HEP with modified independnence in 3 sessions Baseline:  Goal status: INITIAL   2.  pt will describe how to modify HEP over time, and the timeline associated with reduction in HEP frequency with modified independence over two sessions Baseline:  Goal status: INITIAL   PLAN: SLP FREQUENCY: approx 1x/month  SLP DURATION:  7 sessions  PLANNED INTERVENTIONS: Aspiration precaution training, Pharyngeal strengthening exercises, Diet toleration management , Environmental controls, Trials of upgraded texture/liquids, Cueing hierachy, Internal/external aids, Oral motor exercises, SLP instruction and feedback, Compensatory strategies, Patient/family education, and MBSS    Janine Reller, CCC-SLP 05/09/2022, 9:15 AM

## 2022-05-08 NOTE — Patient Instructions (Addendum)
  You will cough less during meals if you: - turn your head to the right when you eat - swallow 2-3 times for every bite of food or sip of liquids. - take smaller bites and sips. - alternate bites and sips 1:1 ratio ==================================== SWALLOWING EXERCISES Do these until 6 months after your last day of radiation, then 2-3 times per week afterwards  Effortful Swallows - swallow as hard as you can on your saliva or a sip of water - Repeat 10-15 times, 2-3 times a day  [place] Swallow - swallow with your tongue sticking out - Stick tongue out past your lips and hold it there while you swallow - Repeat 10-15 times, 2-3 times a day *use a wet spoon if your mouth gets dry*  Shaker Exercise - head lift - Lie flat on your back in your bed or on a couch without pillows - Raise your head and look at your feet - KEEP YOUR SHOULDERS DOWN - HOLD FOR 45-60 SECONDS, then lower your head back down - Repeat 3 times, 2-3 times a day  [employer] - "half swallow" exercise - Swallow as hard as you can and hold the squeeze for 5 seconds - Repeat 10-15 times, 2-3 times a day *use a wet spoon if your mouth gets dry*       5.  "Super Swallow"  - Take a breath and hold it  - Swallow then IMMEDIATELY cough  - Repeat 10 times, 2-3 times a day

## 2022-05-09 ENCOUNTER — Ambulatory Visit: Payer: Medicare Other

## 2022-05-09 ENCOUNTER — Other Ambulatory Visit: Payer: Self-pay

## 2022-05-09 DIAGNOSIS — C140 Malignant neoplasm of pharynx, unspecified: Secondary | ICD-10-CM

## 2022-05-10 ENCOUNTER — Other Ambulatory Visit: Payer: Self-pay

## 2022-05-10 ENCOUNTER — Ambulatory Visit
Admission: RE | Admit: 2022-05-10 | Discharge: 2022-05-10 | Disposition: A | Discharge status: Home / Self Care | Payer: Medicare Other | Source: Ambulatory Visit | Attending: Radiation Oncology | Admitting: Radiation Oncology

## 2022-05-10 DIAGNOSIS — C140 Malignant neoplasm of pharynx, unspecified: Secondary | ICD-10-CM | POA: Diagnosis not present

## 2022-05-10 LAB — RAD ONC ARIA SESSION SUMMARY
Course Elapsed Days: 6
Plan Fractions Treated to Date: 4
Plan Prescribed Dose Per Fraction: 2 Gy
Plan Total Fractions Prescribed: 35
Plan Total Prescribed Dose: 70 Gy
Reference Point Dosage Given to Date: 8 Gy
Reference Point Session Dosage Given: 2 Gy
Session Number: 4

## 2022-05-10 NOTE — Progress Notes (Signed)
Oncology Nurse Navigator Documentation   I met with Nathaniel Long briefly before his radiation appointment today. He gave me a thumbs up that he is doing well with his radiation treatments at this time. He and his family know to call me if they have any needs during his treatment.   Harlow Asa RN, BSN, OCN Head & Neck Oncology Nurse [place] at [facility] Phone # [phone]  Fax # [phone]

## 2022-05-11 ENCOUNTER — Ambulatory Visit
Admission: RE | Admit: 2022-05-11 | Discharge: 2022-05-11 | Disposition: A | Discharge status: Home / Self Care | Payer: Medicare Other | Source: Ambulatory Visit | Attending: Radiation Oncology | Admitting: Radiation Oncology

## 2022-05-11 ENCOUNTER — Other Ambulatory Visit: Payer: Self-pay

## 2022-05-11 ENCOUNTER — Inpatient Hospital Stay: Payer: Medicare Other | Admitting: Nutrition

## 2022-05-11 DIAGNOSIS — C140 Malignant neoplasm of pharynx, unspecified: Secondary | ICD-10-CM | POA: Diagnosis not present

## 2022-05-11 LAB — RAD ONC ARIA SESSION SUMMARY
Course Elapsed Days: 7
Plan Fractions Treated to Date: 5
Plan Prescribed Dose Per Fraction: 2 Gy
Plan Total Fractions Prescribed: 35
Plan Total Prescribed Dose: 70 Gy
Reference Point Dosage Given to Date: 10 Gy
Reference Point Session Dosage Given: 2 Gy
Session Number: 5

## 2022-05-12 ENCOUNTER — Encounter: Payer: Self-pay | Admitting: Nutrition

## 2022-05-12 ENCOUNTER — Ambulatory Visit
Admission: RE | Admit: 2022-05-12 | Discharge: 2022-05-12 | Disposition: A | Discharge status: Home / Self Care | Payer: Medicare Other | Source: Ambulatory Visit | Attending: Radiation Oncology | Admitting: Radiation Oncology

## 2022-05-12 ENCOUNTER — Other Ambulatory Visit: Payer: Self-pay

## 2022-05-12 DIAGNOSIS — C140 Malignant neoplasm of pharynx, unspecified: Secondary | ICD-10-CM | POA: Diagnosis not present

## 2022-05-12 LAB — RAD ONC ARIA SESSION SUMMARY
Course Elapsed Days: 8
Plan Fractions Treated to Date: 6
Plan Prescribed Dose Per Fraction: 2 Gy
Plan Total Fractions Prescribed: 35
Plan Total Prescribed Dose: 70 Gy
Reference Point Dosage Given to Date: 12 Gy
Reference Point Session Dosage Given: 2 Gy
Session Number: 6

## 2022-05-12 NOTE — Progress Notes (Signed)
Patient did not show up for nutrition follow-up. 

## 2022-05-15 ENCOUNTER — Ambulatory Visit
Admission: RE | Admit: 2022-05-15 | Discharge: 2022-05-15 | Disposition: A | Discharge status: Home / Self Care | Payer: Medicare Other | Source: Ambulatory Visit | Attending: Radiation Oncology | Admitting: Radiation Oncology

## 2022-05-15 ENCOUNTER — Other Ambulatory Visit (HOSPITAL_COMMUNITY): Payer: Self-pay

## 2022-05-15 ENCOUNTER — Other Ambulatory Visit: Payer: Self-pay

## 2022-05-15 ENCOUNTER — Ambulatory Visit: Payer: Medicare Other

## 2022-05-15 DIAGNOSIS — C140 Malignant neoplasm of pharynx, unspecified: Secondary | ICD-10-CM | POA: Diagnosis not present

## 2022-05-15 DIAGNOSIS — R1310 Dysphagia, unspecified: Secondary | ICD-10-CM

## 2022-05-15 LAB — RAD ONC ARIA SESSION SUMMARY
Course Elapsed Days: 11
Plan Fractions Treated to Date: 7
Plan Prescribed Dose Per Fraction: 2 Gy
Plan Total Fractions Prescribed: 35
Plan Total Prescribed Dose: 70 Gy
Reference Point Dosage Given to Date: 14 Gy
Reference Point Session Dosage Given: 2 Gy
Session Number: 7

## 2022-05-16 ENCOUNTER — Other Ambulatory Visit: Payer: Self-pay

## 2022-05-16 ENCOUNTER — Ambulatory Visit
Admission: RE | Admit: 2022-05-16 | Discharge: 2022-05-16 | Disposition: A | Discharge status: Home / Self Care | Payer: Medicare Other | Source: Ambulatory Visit | Attending: Radiation Oncology | Admitting: Radiation Oncology

## 2022-05-16 DIAGNOSIS — C140 Malignant neoplasm of pharynx, unspecified: Secondary | ICD-10-CM | POA: Diagnosis not present

## 2022-05-16 LAB — RAD ONC ARIA SESSION SUMMARY
Course Elapsed Days: 12
Plan Fractions Treated to Date: 8
Plan Prescribed Dose Per Fraction: 2 Gy
Plan Total Fractions Prescribed: 35
Plan Total Prescribed Dose: 70 Gy
Reference Point Dosage Given to Date: 16 Gy
Reference Point Session Dosage Given: 2 Gy
Session Number: 8

## 2022-05-17 ENCOUNTER — Other Ambulatory Visit: Payer: Self-pay

## 2022-05-17 ENCOUNTER — Ambulatory Visit
Admission: RE | Admit: 2022-05-17 | Discharge: 2022-05-17 | Disposition: A | Discharge status: Home / Self Care | Payer: Medicare Other | Source: Ambulatory Visit | Attending: Radiation Oncology | Admitting: Radiation Oncology

## 2022-05-17 DIAGNOSIS — C140 Malignant neoplasm of pharynx, unspecified: Secondary | ICD-10-CM | POA: Diagnosis not present

## 2022-05-17 LAB — RAD ONC ARIA SESSION SUMMARY
Course Elapsed Days: 13
Plan Fractions Treated to Date: 9
Plan Prescribed Dose Per Fraction: 2 Gy
Plan Total Fractions Prescribed: 35
Plan Total Prescribed Dose: 70 Gy
Reference Point Dosage Given to Date: 18 Gy
Reference Point Session Dosage Given: 2 Gy
Session Number: 9

## 2022-05-18 ENCOUNTER — Other Ambulatory Visit: Payer: Self-pay

## 2022-05-18 ENCOUNTER — Ambulatory Visit
Admission: RE | Admit: 2022-05-18 | Discharge: 2022-05-18 | Disposition: A | Discharge status: Home / Self Care | Payer: Medicare Other | Source: Ambulatory Visit | Attending: Radiation Oncology | Admitting: Radiation Oncology

## 2022-05-18 ENCOUNTER — Inpatient Hospital Stay: Payer: Medicare Other | Admitting: Nutrition

## 2022-05-18 DIAGNOSIS — C140 Malignant neoplasm of pharynx, unspecified: Secondary | ICD-10-CM | POA: Diagnosis not present

## 2022-05-18 LAB — RAD ONC ARIA SESSION SUMMARY
Course Elapsed Days: 14
Plan Fractions Treated to Date: 10
Plan Prescribed Dose Per Fraction: 2 Gy
Plan Total Fractions Prescribed: 35
Plan Total Prescribed Dose: 70 Gy
Reference Point Dosage Given to Date: 20 Gy
Reference Point Session Dosage Given: 2 Gy
Session Number: 10

## 2022-05-18 NOTE — Progress Notes (Signed)
Patient presents to nutrition follow-up with his daughter.  Weight documented as 162.8 pounds.  This is decreased from a 168.2 pounds on August 4.  Patient reports he is able to consume oral nutrition supplements easier when he drinks a vanilla flavor with his meal. He is eating a wider variety of foods and states he ate an oatmeal cookie for breakfast and a hamburger for lunch.  He also likes hotdogs. Patient expresses concern regarding cost of oral nutrition supplements. He voices no other concerns.  Nutrition diagnosis: Food and nutrition related knowledge deficit continues.  Intervention: Recommended patient drink 1 vanilla Ensure plus high-protein with meals daily. Continue to blenderized foods to increase variety. Encouraged him to flavor ensure as desired between meals. Provided 1 complementary case of Ensure Plus high-protein.  Monitoring, evaluation, goals: Patient will tolerate increased calories and protein to minimize weight loss.  Next visit: Thursday, August 24 after radiation therapy.  **Disclaimer: This note was dictated with voice recognition software. Similar sounding words can inadvertently be transcribed and this note may contain transcription errors which may not have been corrected upon publication of note.**

## 2022-05-19 ENCOUNTER — Ambulatory Visit
Admission: RE | Admit: 2022-05-19 | Discharge: 2022-05-19 | Disposition: A | Discharge status: Home / Self Care | Payer: Medicare Other | Source: Ambulatory Visit | Attending: Radiation Oncology | Admitting: Radiation Oncology

## 2022-05-19 ENCOUNTER — Other Ambulatory Visit: Payer: Self-pay

## 2022-05-19 DIAGNOSIS — C140 Malignant neoplasm of pharynx, unspecified: Secondary | ICD-10-CM | POA: Diagnosis not present

## 2022-05-19 LAB — RAD ONC ARIA SESSION SUMMARY
Course Elapsed Days: 15
Plan Fractions Treated to Date: 11
Plan Prescribed Dose Per Fraction: 2 Gy
Plan Total Fractions Prescribed: 35
Plan Total Prescribed Dose: 70 Gy
Reference Point Dosage Given to Date: 22 Gy
Reference Point Session Dosage Given: 2 Gy
Session Number: 11

## 2022-05-22 ENCOUNTER — Ambulatory Visit
Admission: RE | Admit: 2022-05-22 | Discharge: 2022-05-22 | Disposition: A | Discharge status: Home / Self Care | Payer: Medicare Other | Source: Ambulatory Visit | Attending: Radiation Oncology | Admitting: Radiation Oncology

## 2022-05-22 ENCOUNTER — Ambulatory Visit: Payer: Medicare Other

## 2022-05-22 ENCOUNTER — Other Ambulatory Visit: Payer: Self-pay

## 2022-05-22 DIAGNOSIS — C140 Malignant neoplasm of pharynx, unspecified: Secondary | ICD-10-CM | POA: Diagnosis not present

## 2022-05-22 LAB — RAD ONC ARIA SESSION SUMMARY
Course Elapsed Days: 18
Plan Fractions Treated to Date: 12
Plan Prescribed Dose Per Fraction: 2 Gy
Plan Total Fractions Prescribed: 35
Plan Total Prescribed Dose: 70 Gy
Reference Point Dosage Given to Date: 24 Gy
Reference Point Session Dosage Given: 2 Gy
Session Number: 12

## 2022-05-23 ENCOUNTER — Ambulatory Visit
Admission: RE | Admit: 2022-05-23 | Discharge: 2022-05-23 | Disposition: A | Discharge status: Home / Self Care | Payer: Medicare Other | Source: Ambulatory Visit | Attending: Radiation Oncology | Admitting: Radiation Oncology

## 2022-05-23 ENCOUNTER — Other Ambulatory Visit: Payer: Self-pay

## 2022-05-23 DIAGNOSIS — C140 Malignant neoplasm of pharynx, unspecified: Secondary | ICD-10-CM | POA: Diagnosis not present

## 2022-05-23 LAB — RAD ONC ARIA SESSION SUMMARY
Course Elapsed Days: 19
Plan Fractions Treated to Date: 13
Plan Prescribed Dose Per Fraction: 2 Gy
Plan Total Fractions Prescribed: 35
Plan Total Prescribed Dose: 70 Gy
Reference Point Dosage Given to Date: 26 Gy
Reference Point Session Dosage Given: 2 Gy
Session Number: 13

## 2022-05-24 ENCOUNTER — Other Ambulatory Visit: Payer: Self-pay

## 2022-05-24 ENCOUNTER — Ambulatory Visit (HOSPITAL_COMMUNITY)
Admission: RE | Admit: 2022-05-24 | Discharge: 2022-05-24 | Disposition: A | Discharge status: Home / Self Care | Payer: Medicare Other | Source: Ambulatory Visit | Attending: Internal Medicine | Admitting: Internal Medicine

## 2022-05-24 ENCOUNTER — Ambulatory Visit
Admission: RE | Admit: 2022-05-24 | Discharge: 2022-05-24 | Disposition: A | Discharge status: Home / Self Care | Payer: Medicare Other | Source: Ambulatory Visit | Attending: Radiation Oncology | Admitting: Radiation Oncology

## 2022-05-24 DIAGNOSIS — Z510 Encounter for antineoplastic radiation therapy: Secondary | ICD-10-CM | POA: Insufficient documentation

## 2022-05-24 DIAGNOSIS — R1310 Dysphagia, unspecified: Secondary | ICD-10-CM | POA: Diagnosis present

## 2022-05-24 DIAGNOSIS — Z902 Acquired absence of lung [part of]: Secondary | ICD-10-CM | POA: Diagnosis not present

## 2022-05-24 DIAGNOSIS — J449 Chronic obstructive pulmonary disease, unspecified: Secondary | ICD-10-CM | POA: Insufficient documentation

## 2022-05-24 DIAGNOSIS — R1314 Dysphagia, pharyngoesophageal phase: Secondary | ICD-10-CM | POA: Insufficient documentation

## 2022-05-24 DIAGNOSIS — C140 Malignant neoplasm of pharynx, unspecified: Secondary | ICD-10-CM

## 2022-05-24 LAB — RAD ONC ARIA SESSION SUMMARY
Course Elapsed Days: 20
Plan Fractions Treated to Date: 14
Plan Prescribed Dose Per Fraction: 2 Gy
Plan Total Fractions Prescribed: 35
Plan Total Prescribed Dose: 70 Gy
Reference Point Dosage Given to Date: 28 Gy
Reference Point Session Dosage Given: 2 Gy
Session Number: 14

## 2022-05-25 ENCOUNTER — Other Ambulatory Visit: Payer: Self-pay

## 2022-05-25 ENCOUNTER — Inpatient Hospital Stay: Payer: Medicare Other | Admitting: Nutrition

## 2022-05-25 ENCOUNTER — Ambulatory Visit
Admission: RE | Admit: 2022-05-25 | Discharge: 2022-05-25 | Disposition: A | Discharge status: Home / Self Care | Payer: Medicare Other | Source: Ambulatory Visit | Attending: Radiation Oncology | Admitting: Radiation Oncology

## 2022-05-25 DIAGNOSIS — C140 Malignant neoplasm of pharynx, unspecified: Secondary | ICD-10-CM | POA: Diagnosis not present

## 2022-05-25 LAB — RAD ONC ARIA SESSION SUMMARY
Course Elapsed Days: 21
Plan Fractions Treated to Date: 15
Plan Prescribed Dose Per Fraction: 2 Gy
Plan Total Fractions Prescribed: 35
Plan Total Prescribed Dose: 70 Gy
Reference Point Dosage Given to Date: 30 Gy
Reference Point Session Dosage Given: 2 Gy
Session Number: 15

## 2022-05-25 NOTE — Progress Notes (Signed)
Nutrition follow-up completed with patient and daughter.  Patient is receiving radiation therapy for hypopharyngeal cancer.  Weight documented as 160.2 pounds today down from 162.8 pounds last week.  Patient has some difficulty swallowing thicker beverages.  He reports increased saliva.  He is using baking soda and salt water rinses.  He tries to continue to eat a variety of foods.  States he misses eating hamburgers and [place] fries.  Nutrition diagnosis: Food and nutrition related knowledge deficit, continues.  Intervention: Increase Ensure Plus high-protein twice daily between meals. Continue pureing foods as needed for variety. Increase baking soda and salt water rinses, especially before eating or drinking. Try cool-mist humidifier in bedroom overnight. Provided 1 complementary case of Ensure Plus high-protein.  Monitoring, evaluation, goals: Patient will tolerate increased calories and protein to minimize weight loss.  Next visit: Thursday, August 31 after radiation therapy.  **Disclaimer: This note was dictated with voice recognition software. Similar sounding words can inadvertently be transcribed and this note may contain transcription errors which may not have been corrected upon publication of note.**

## 2022-05-26 ENCOUNTER — Ambulatory Visit
Admission: RE | Admit: 2022-05-26 | Discharge: 2022-05-26 | Disposition: A | Discharge status: Home / Self Care | Payer: Medicare Other | Source: Ambulatory Visit | Attending: Radiation Oncology | Admitting: Radiation Oncology

## 2022-05-26 ENCOUNTER — Other Ambulatory Visit: Payer: Self-pay

## 2022-05-26 DIAGNOSIS — C140 Malignant neoplasm of pharynx, unspecified: Secondary | ICD-10-CM | POA: Diagnosis not present

## 2022-05-26 LAB — RAD ONC ARIA SESSION SUMMARY
Course Elapsed Days: 22
Plan Fractions Treated to Date: 16
Plan Prescribed Dose Per Fraction: 2 Gy
Plan Total Fractions Prescribed: 35
Plan Total Prescribed Dose: 70 Gy
Reference Point Dosage Given to Date: 32 Gy
Reference Point Session Dosage Given: 2 Gy
Session Number: 16

## 2022-05-29 ENCOUNTER — Ambulatory Visit: Payer: Medicare Other

## 2022-05-29 ENCOUNTER — Encounter: Payer: Self-pay | Admitting: Physical Therapy

## 2022-05-29 ENCOUNTER — Ambulatory Visit: Payer: Medicare Other | Attending: Radiation Oncology | Admitting: Physical Therapy

## 2022-05-29 ENCOUNTER — Other Ambulatory Visit: Payer: Self-pay

## 2022-05-29 ENCOUNTER — Ambulatory Visit
Admission: RE | Admit: 2022-05-29 | Discharge: 2022-05-29 | Disposition: A | Discharge status: Home / Self Care | Payer: Medicare Other | Source: Ambulatory Visit | Attending: Radiation Oncology | Admitting: Radiation Oncology

## 2022-05-29 DIAGNOSIS — C140 Malignant neoplasm of pharynx, unspecified: Secondary | ICD-10-CM | POA: Insufficient documentation

## 2022-05-29 DIAGNOSIS — R293 Abnormal posture: Secondary | ICD-10-CM | POA: Insufficient documentation

## 2022-05-29 LAB — RAD ONC ARIA SESSION SUMMARY
Course Elapsed Days: 25
Plan Fractions Treated to Date: 17
Plan Prescribed Dose Per Fraction: 2 Gy
Plan Total Fractions Prescribed: 35
Plan Total Prescribed Dose: 70 Gy
Reference Point Dosage Given to Date: 34 Gy
Reference Point Session Dosage Given: 2 Gy
Session Number: 17

## 2022-05-29 NOTE — Therapy (Signed)
OUTPATIENT PHYSICAL THERAPY HEAD AND NECK BASELINE EVALUATION   Patient Name: Nathaniel Long MRN: [id number] DOB:1941-02-01, 81 y.o., male Today's Date: 05/29/2022   PT End of Session - 05/29/22 1450     Visit Number 1    Number of Visits 2    Date for PT Re-Evaluation 07/24/22    PT Start Time 1448    PT Stop Time 1520    PT Time Calculation (min) 32 min    Activity Tolerance Patient tolerated treatment well    Behavior During Therapy [facility] for tasks assessed/performed             Past Medical History:  Diagnosis Date   Anxiety    Cancer ([place])    lung   Carotid artery stenosis    Cataract    Chronic lower back pain    CKD (chronic kidney disease)    Complication of anesthesia    "can't pee when I come out of OR" (07/18/2017)   Contact with chainsaw as cause of accidental injury 07/16/2017   sternal fracture with bilateral anterior fifth and sixth rib fractures /notes 07/16/2017   COPD (chronic obstructive pulmonary disease) ([place])    Dyspnea    GERD (gastroesophageal reflux disease)    Hard of hearing    High cholesterol    Hypertension    Peripheral vascular disease ([place])    Thoracic ascending aortic aneurysm ([place])    Past Surgical History:  Procedure Laterality Date   ABDOMINAL AORTOGRAM W/LOWER EXTREMITY N/A 05/01/2017   Procedure: Abdominal Aortogram w/Lower Extremity;  Surgeon: Adrian Prows, MD;  Location: [place] [facility];  Service: Cardiovascular;  Laterality: N/A;   BACK SURGERY     FINGER REPLANTATION Left    "ring finger"   HEMORRHOID SURGERY     POSTERIOR LUMBAR FUSION  1986   RENAL ANGIOGRAPHY Bilateral 12/18/2017   Procedure: RENAL ANGIOGRAPHY;  Surgeon: Nigel Mormon, MD;  Location: [place] [facility];  Service: Cardiovascular;  Laterality: Bilateral;   VIDEO ASSISTED THORACOSCOPY (VATS)/ LOBECTOMY Left 09/19/2017   Procedure: LEFT VIDEO ASSISTED THORACOSCOPY (VATS)/ LEFT UPPER LOBECTOMY, LYMPH NODE DISECTION;  Surgeon: Melrose Nakayama,  MD;  Location: [place];  Service: Thoracic;  Laterality: Left;   VIDEO BRONCHOSCOPY WITH ENDOBRONCHIAL NAVIGATION N/A 09/05/2017   Procedure: VIDEO BRONCHOSCOPY WITH ENDOBRONCHIAL NAVIGATION;  Surgeon: Collene Gobble, MD;  Location: [place];  Service: Thoracic;  Laterality: N/A;   VIDEO BRONCHOSCOPY WITH ENDOBRONCHIAL ULTRASOUND N/A 09/05/2017   Procedure: VIDEO BRONCHOSCOPY WITH ENDOBRONCHIAL ULTRASOUND;  Surgeon: Collene Gobble, MD;  Location: [place];  Service: Thoracic;  Laterality: N/A;   Patient Active Problem List   Diagnosis Date Noted   Encounter for preoperative dental examination 04/17/2022   Teeth missing 04/17/2022   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   Accretions on teeth 04/17/2022   Generalized gingival recession 04/17/2022   Excessive dental attrition 04/17/2022   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   Hematoma of right ankle 10/28/2021   GI bleed 06/18/2020   COVID-19 virus infection 06/18/2020   Abdominal pain 06/18/2020   CKD (chronic kidney disease) stage 3, GFR 30-59 ml/min ([place]) 06/18/2020   COPD (chronic obstructive pulmonary disease) ([place]) 06/18/2020   Hard of hearing 06/18/2020   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   Right renal artery stenosis ([place]) 12/17/2017   Hydropneumothorax 09/28/2017   S/P lobectomy of lung 09/19/2017   Malignant neoplasm of upper lobe of left lung ([place]) 09/13/2017   Pulmonary nodule, left 09/05/2017  Mediastinal lymphadenopathy 09/05/2017   Multiple rib fractures 07/16/2017   Spondylolisthesis, lumbar region 06/18/2017   Right hip pain 05/23/2017   Chronic bilateral low back pain with bilateral sciatica 05/23/2017   Colon cancer screening 01/12/2017   Antiplatelet or antithrombotic long-term use 01/12/2017   PAD (peripheral artery disease) ([place]) 04/13/2011   Murmur, heart 12/08/2010   ACTINIC KERATOSIS, HEAD 07/14/2010   BRUISED ANKLE 07/14/2010   HYPERTENSION, BENIGN 11/19/2009   TOBACCO ABUSE 10/28/2009   EAR PAIN,  LEFT 10/28/2009    PCP: [name], MD  REFERRING PROVIDER: Eppie Gibson, MD   REFERRING DIAG: C14.0 (ICD-10-CM) - 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    PERTINENT HISTORY:  Presented with ongoing left ear pain and throat pain to Dr. West Carbo on 02/23/22. Laryngoscopy performed  during that visit revealed a mass on the posterior pharyngeal wall noted as slightly friable in appearance. Normal vocal cord mobility was appreciated without vocal cord nodule, mass, polyp or tumor. Hypopharynx also appeared normal without mass, pooling of secretions or aspiration.CT neck/thyroid w cont 03-07-22 showed:  "No evidence of laryngeal mass. Question abnormal appearance of the  upper cervical esophagus, which may show mild thickening with adjacent edema, which could go along with esophagitis. No definite adenopathy. Single slightly prominent level 2 level 3 junction lymph node on the left with short axis dimension of 9-10 mm.  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 The patient followed up with Dr. Redmond Baseman on 03/16/22 to discuss treatment options. Options included surgical resection with reconstruction and radiation treatment, vs primary chemoradiation were discussed with the patient. Pt chose to complete radiation. 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 Recent chest CT showed no evidence of local recurrence or metastatic disease. " Pt began radiation on Thursday 05/04/22.  PATIENT GOALS   to be educated about the signs and symptoms of lymphedema and learn post op HEP.   PAIN:  Are you having pain? No - pt has pain when eating and drinking- has to eat and drink slowly  PRECAUTIONS: Active CA, COPD, back fusions  WEIGHT BEARING RESTRICTIONS No  FALLS:  Has patient fallen in last 6 months? Yes. Number of falls pt reports numerous falls 10 or 15 times  pt reports he gets dizzy when he changes positions Does the patient have a fear of falling that limits activity? No Is the patient reluctant to leave the house due to a fear of falling?No  LIVING ENVIRONMENT: Patient lives with: wife and 2 teenage sons Lives in: House/apartment Has following equipment at home: None  OCCUPATION: retired  LEISURE: pt reports he does not exercise - he does a lot of yard work  PRIOR LEVEL OF FUNCTION: Independent   OBJECTIVE  COGNITION:           Overall cognitive status: Within functional limits for tasks assessed  POSTURE:  Forward head and rounded shoulders posture   30 SEC SIT TO STAND: 17 reps in 30 sec without use of UEs which is  Excellent for patient's age   SHOULDER AROM:   WFL    CERVICAL AROM:   Percent limited  Flexion WFL  Extension WFL  Right lateral flexion WFL  Left lateral flexion WFL  Right rotation WFL  Left rotation WFL    (Blank rows=not tested)   GAIT:  Assessed: Yes Assistance needed: Independent  Ambulation Distance: 15 feet  Assistive Device: none Gait pattern: narrow base of support Ambulation surface: Level  PATIENT EDUCATION:  Education details: Neck ROM, importance of posture when sitting, standing and lying down, deep breathing, walking program and importance of staying active throughout treatment, CURE article on staying active, "Why exercise?" flyer, lymphedema and PT  info Person educated: Patient Education method: Explanation, Demonstration, Handout Education comprehension: Patient verbalized understanding and returned demonstration   HOME EXERCISE PROGRAM: Patient was instructed today in a home exercise program today for head and neck range of motion exercises. These included active cervical flexion, active cervical extension, active cervical rotation to each direction, upper trap stretch, and shoulder retraction. Patient was encouraged to do these 2-3 times a day, holding for 5 sec each and completing for 5 reps. Pt was educated that once this becomes easier then hold the stretches for 30-60 seconds.    ASSESSMENT:  CLINICAL IMPRESSION: Pt arrives to PT with recently diagnosed with cancer of posterior pharynx. Pt will undergo radiation. Pt will start treatment on 05/04/22 and complete on 06/23/22.  Pt's cervical ROM was [facility]. He walks with a narrow base of support. He does report frequent falls due to tripping on pets and things in the yard. Pt's daughter in law reports there is a lot of stuff in the yard to trip on. He does not feel like he has issues with his balance. He reports falling occasionally when he gets dizzy which happens when he gets down on one knee and bends over to work on his mower.  Educated pt about signs and symptoms of lymphedema as well as anatomy and physiology of lymphatic system. Educated pt in importance of staying as active as possible throughout treatment to decrease fatigue as well as head and neck ROM exercises to decrease loss of ROM. Will see pt after completion of radiation to reassess ROM and assess for lymphedema and to determine therapy needs at that time.  Pt will benefit from skilled therapeutic intervention to improve on the following deficits: Decreased knowledge of precautions and postural dysfunction.   PT treatment/interventions: ADL/self-care home management, pt/family education, therapeutic exercise. Other interventions    REHAB POTENTIAL: Good  CLINICAL DECISION MAKING: Stable/uncomplicated  EVALUATION COMPLEXITY: Low   GOALS: Goals reviewed with patient? YES  LONG TERM GOALS: (STG=LTG)   Name Target Date  Goal status  1 Patient will be able to verbalize understanding of a home exercise program for cervical range of motion, posture, and walking.   Baseline:  No knowledge 05/29/2022 Achieved at eval  2 Patient will be able to verbalize understanding of proper sitting and standing posture. Baseline:  No knowledge 05/29/2022 Achieved at eval  3 Patient will be able to verbalize understanding of lymphedema risk and availability of treatment for this condition Baseline:  No knowledge 05/29/2022 Achieved at eval  4 Pt will demonstrate a return to full cervical ROM and function post operatively compared to baselines and not demonstrate any signs or symptoms of  lymphedema.  Baseline: See objective measurements taken today. 07/24/2022 New     PLAN: PT FREQUENCY/DURATION: EVAL and 1 follow up appointment.   PLAN FOR NEXT SESSION: will reassess 2 weeks after completion of radiation to determine needs, assess balance  Patient will follow up at outpatient cancer rehab 2 weeks after completion of radiation.  If the patient requires physical therapy at that time, a specific plan will be dictated and sent to the referring physician for approval. The patient was educated today on appropriate basic range of motion exercises to begin now and continue throughout radiation and educated on the signs and symptoms of lymphedema. Patient verbalized good understanding.     Physical Therapy Information for During and After Head/Neck Cancer Treatment: Lymphedema is a swelling condition that you may be at risk for in your neck and/or face if you have radiation treatment to the area and/or if you have surgery that includes removing lymph nodes.  There is treatment available for this condition and it is not life-threatening.   Contact your physician or physical therapist with concerns. An excellent resource for those seeking information on lymphedema is the National Lymphedema Network's website.  It can be accessed at [place].org If you notice swelling in your neck or face at any time following surgery (even if it is many years from now), please contact your doctor or physical therapist to discuss this.  Lymphedema can be treated at any time but it is easier for you if it is treated early on. If you have had surgery to your neck, please check with your surgeon about how soon to start doing neck range of motion exercises.  If you are not having surgery, I encourage you to start doing neck range of motion exercises today and continue these while undergoing treatment, UNLESS you have irritation of your skin or soft tissue that is aggravated by doing them.  These exercises are intended to help you prevent loss of range of motion and/or to gain range of motion in your neck (which can be limited by tightening effects of radiation), and NOT to aggravate these tissues if they develop sensitivities from treatment. Neck range of motion exercises should be done to the point of feeling a GENTLE, TOLERABLE stretch only.  You are encouraged to start a walking or other exercise program tomorrow and continue this as much as you are able through and after treatment.  Please feel free to call me with any questions. Manus Gunning, PT, CLT Physical Therapist and Certified Lymphedema Therapist [facility] [address], [place], [place] [address] [phone] [email or web address]  WALKING  Walking is a great form of exercise to increase your strength, endurance and overall fitness.  A walking program can help you start slowly and gradually build endurance as you go.  Everyone's ability is different, so each person's starting point will be different.  You do not have to follow them  exactly.  The are just samples. You should simply find out what's right for you and stick to that program.   In the beginning, you'll start off walking 2-3 times a day for short distances.  As you get stronger, you'll be walking further at just 1-2 times per day.  A. You Can Walk For A Certain Length Of Time Each Day    Walk 5 minutes 3 times per day.  Increase 2 minutes every 2 days (3 times per day).  Work up to 25-30 minutes (1-2 times per day).  Example:   Day 1-2 5 minutes 3 times per day   Day 7-8 12 minutes 2-3 times per day   Day 13-14 25 minutes 1-2 times per day  B. You Can Walk For a Certain Distance Each Day     Distance can be substituted for time.    Example:   3 trips to mailbox (at road)   3 trips to corner of block   3 trips around the block  C. Go to local high school and use the track.    Walk for distance ____ around track  Or time ____ minutes  D. Walk ____ Jog ____ Run ___   Why exercise?  So many benefits! Here are SOME of them: Heart health, including raising your good cholesterol level and reducing heart rate and blood pressure Lung health, including improved lung capacity It burns fats, and most of [place] can stand to be leaner, whether or not we are overweight. It increases the body's natural painkillers and mood elevators, so makes you feel better. Not only makes you feel better, but look better too Improves sleep Takes a bite out of stress May decrease your risk of many types of cancer If you are currently undergoing cancer treatment, exercise may improve your ability to tolerate treatments including chemotherapy. For everybody, it can improve your energy level. Those with cancer-related fatigue report a 40-50% reduction in this symptom when exercising regularly. If you are a survivor of breast, colon, or prostate cancer, it may decrease your risk of a recurrence. (This may hold for other cancers too, but so far we have data just for these three  types.)  How to exercise: Get your doctor's okay. Pick something you enjoy doing, like walking, Zumba, biking, swimming, or whatever. Start at low intensity and time, then gradually increase.  (See walking program handout.) Set a goal to achieve over time.  The American Cancer Society, American Heart Association, and U.S. Dept. of Health and Human Services recommend 150 minutes of moderate exercise, 75 minutes of vigorous exercise, or a combination of both per week. This should be done in episodes at least 10 minutes long, spread throughout the week.  Need help being motivated? Pick something you enjoy doing, because you'll be more inclined to stick with that activity than something that feels like a chore. Do it with a friend so that you are accountable to each other. Schedule it into your day. Place it on your calendar and keep that appointment just like you do any appointment that you make. Join an exercise group that meets at a specific time.  That way, you have to show up on time, and that makes it harder to procrastinate about doing your workout.  It also keeps you accountable--people begin to expect you to be there. Join a gym where you feel comfortable and not intimidated, at the right cost. Sign up for something that you'll need to be in shape for on a specific date, like a 1K or a 5K to walk or run, a 20 or 30 mile bike ride, a mud run or something like that. If the date is looming, you know you'll need to train to be ready for it.  An added benefit is that many of these are fundraisers for good causes. If you've already paid for a gym membership, group exercise class or event, you might as well work out, so you haven't wasted your money!    [address] [place], [place] 05/29/2022, 3:24 PM

## 2022-05-30 ENCOUNTER — Other Ambulatory Visit: Payer: Self-pay

## 2022-05-30 ENCOUNTER — Ambulatory Visit
Admission: RE | Admit: 2022-05-30 | Discharge: 2022-05-30 | Disposition: A | Discharge status: Home / Self Care | Payer: Medicare Other | Source: Ambulatory Visit | Attending: Radiation Oncology | Admitting: Radiation Oncology

## 2022-05-30 DIAGNOSIS — C140 Malignant neoplasm of pharynx, unspecified: Secondary | ICD-10-CM | POA: Diagnosis not present

## 2022-05-30 LAB — RAD ONC ARIA SESSION SUMMARY
Course Elapsed Days: 26
Plan Fractions Treated to Date: 18
Plan Prescribed Dose Per Fraction: 2 Gy
Plan Total Fractions Prescribed: 35
Plan Total Prescribed Dose: 70 Gy
Reference Point Dosage Given to Date: 36 Gy
Reference Point Session Dosage Given: 2 Gy
Session Number: 18

## 2022-05-31 ENCOUNTER — Ambulatory Visit
Admission: RE | Admit: 2022-05-31 | Discharge: 2022-05-31 | Disposition: A | Discharge status: Home / Self Care | Payer: Medicare Other | Source: Ambulatory Visit | Attending: Radiation Oncology | Admitting: Radiation Oncology

## 2022-05-31 ENCOUNTER — Other Ambulatory Visit: Payer: Self-pay

## 2022-05-31 DIAGNOSIS — C140 Malignant neoplasm of pharynx, unspecified: Secondary | ICD-10-CM | POA: Diagnosis not present

## 2022-05-31 LAB — RAD ONC ARIA SESSION SUMMARY
Course Elapsed Days: 27
Plan Fractions Treated to Date: 19
Plan Prescribed Dose Per Fraction: 2 Gy
Plan Total Fractions Prescribed: 35
Plan Total Prescribed Dose: 70 Gy
Reference Point Dosage Given to Date: 38 Gy
Reference Point Session Dosage Given: 2 Gy
Session Number: 19

## 2022-06-01 ENCOUNTER — Ambulatory Visit
Admission: RE | Admit: 2022-06-01 | Discharge: 2022-06-01 | Disposition: A | Discharge status: Home / Self Care | Payer: Medicare Other | Source: Ambulatory Visit | Attending: Radiation Oncology | Admitting: Radiation Oncology

## 2022-06-01 ENCOUNTER — Inpatient Hospital Stay: Payer: Medicare Other | Admitting: Nutrition

## 2022-06-01 ENCOUNTER — Other Ambulatory Visit: Payer: Self-pay

## 2022-06-01 DIAGNOSIS — C140 Malignant neoplasm of pharynx, unspecified: Secondary | ICD-10-CM | POA: Diagnosis not present

## 2022-06-01 LAB — RAD ONC ARIA SESSION SUMMARY
Course Elapsed Days: 28
Plan Fractions Treated to Date: 20
Plan Prescribed Dose Per Fraction: 2 Gy
Plan Total Fractions Prescribed: 35
Plan Total Prescribed Dose: 70 Gy
Reference Point Dosage Given to Date: 40 Gy
Reference Point Session Dosage Given: 2 Gy
Session Number: 20

## 2022-06-01 NOTE — Progress Notes (Signed)
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  Weight documented as 161.8 pounds August 28 increased from 160.2 pounds August 24.  Patient reports he is a new man.  He is taking Mucinex which has lessened thick saliva and allowed him to eat normally again.  Reports swallowing normally.  States he has not needed to drink as many Ensure Plus.  Nutrition diagnosis: Food and nutrition related knowledge deficit, ongoing.  Intervention: Consume Ensure plus high-protein daily as needed. Encourage diet as tolerated for increased variety. Continue baking soda and salt water rinses prior to meals. Medications per MD.  Monitoring, evaluation, goals: Patient will tolerate increased calories and protein to minimize weight loss.  Next visit: Thursday, September 7 after radiation therapy.

## 2022-06-02 ENCOUNTER — Ambulatory Visit
Admission: RE | Admit: 2022-06-02 | Discharge: 2022-06-02 | Disposition: A | Discharge status: Home / Self Care | Payer: Medicare Other | Source: Ambulatory Visit | Attending: Radiation Oncology | Admitting: Radiation Oncology

## 2022-06-02 ENCOUNTER — Other Ambulatory Visit: Payer: Self-pay

## 2022-06-02 DIAGNOSIS — C140 Malignant neoplasm of pharynx, unspecified: Secondary | ICD-10-CM | POA: Insufficient documentation

## 2022-06-02 LAB — RAD ONC ARIA SESSION SUMMARY
Course Elapsed Days: 29
Plan Fractions Treated to Date: 21
Plan Prescribed Dose Per Fraction: 2 Gy
Plan Total Fractions Prescribed: 35
Plan Total Prescribed Dose: 70 Gy
Reference Point Dosage Given to Date: 42 Gy
Reference Point Session Dosage Given: 2 Gy
Session Number: 21

## 2022-06-06 ENCOUNTER — Ambulatory Visit: Payer: Medicare Other

## 2022-06-06 ENCOUNTER — Ambulatory Visit
Admission: RE | Admit: 2022-06-06 | Discharge: 2022-06-06 | Disposition: A | Discharge status: Home / Self Care | Payer: Medicare Other | Source: Ambulatory Visit | Attending: Radiation Oncology | Admitting: Radiation Oncology

## 2022-06-06 ENCOUNTER — Other Ambulatory Visit: Payer: Self-pay

## 2022-06-06 DIAGNOSIS — C140 Malignant neoplasm of pharynx, unspecified: Secondary | ICD-10-CM | POA: Diagnosis not present

## 2022-06-06 LAB — RAD ONC ARIA SESSION SUMMARY
Course Elapsed Days: 33
Plan Fractions Treated to Date: 22
Plan Prescribed Dose Per Fraction: 2 Gy
Plan Total Fractions Prescribed: 35
Plan Total Prescribed Dose: 70 Gy
Reference Point Dosage Given to Date: 44 Gy
Reference Point Session Dosage Given: 2 Gy
Session Number: 22

## 2022-06-07 ENCOUNTER — Other Ambulatory Visit: Payer: Self-pay

## 2022-06-07 ENCOUNTER — Ambulatory Visit
Admission: RE | Admit: 2022-06-07 | Discharge: 2022-06-07 | Disposition: A | Discharge status: Home / Self Care | Payer: Medicare Other | Source: Ambulatory Visit | Attending: Radiation Oncology | Admitting: Radiation Oncology

## 2022-06-07 DIAGNOSIS — C140 Malignant neoplasm of pharynx, unspecified: Secondary | ICD-10-CM | POA: Diagnosis not present

## 2022-06-07 LAB — RAD ONC ARIA SESSION SUMMARY
Course Elapsed Days: 34
Plan Fractions Treated to Date: 23
Plan Prescribed Dose Per Fraction: 2 Gy
Plan Total Fractions Prescribed: 35
Plan Total Prescribed Dose: 70 Gy
Reference Point Dosage Given to Date: 46 Gy
Reference Point Session Dosage Given: 2 Gy
Session Number: 23

## 2022-06-08 ENCOUNTER — Other Ambulatory Visit: Payer: Self-pay

## 2022-06-08 ENCOUNTER — Ambulatory Visit
Admission: RE | Admit: 2022-06-08 | Discharge: 2022-06-08 | Disposition: A | Discharge status: Home / Self Care | Payer: Medicare Other | Source: Ambulatory Visit | Attending: Radiation Oncology | Admitting: Radiation Oncology

## 2022-06-08 ENCOUNTER — Ambulatory Visit: Payer: Medicare Other | Attending: Radiation Oncology

## 2022-06-08 ENCOUNTER — Inpatient Hospital Stay: Payer: Medicare Other | Attending: Internal Medicine | Admitting: Nutrition

## 2022-06-08 DIAGNOSIS — C140 Malignant neoplasm of pharynx, unspecified: Secondary | ICD-10-CM | POA: Diagnosis not present

## 2022-06-08 DIAGNOSIS — R1313 Dysphagia, pharyngeal phase: Secondary | ICD-10-CM | POA: Insufficient documentation

## 2022-06-08 LAB — RAD ONC ARIA SESSION SUMMARY
Course Elapsed Days: 35
Plan Fractions Treated to Date: 24
Plan Prescribed Dose Per Fraction: 2 Gy
Plan Total Fractions Prescribed: 35
Plan Total Prescribed Dose: 70 Gy
Reference Point Dosage Given to Date: 48 Gy
Reference Point Session Dosage Given: 2 Gy
Session Number: 24

## 2022-06-08 NOTE — Patient Instructions (Signed)
   WHEN YOU EAT and DRINK:  Small sips and bites; 1-2 dry swallows after each bite or sip; Follow solids with liquid; Hard swallows! "Hock" and re-swallow when meal or snack is complete Slow rate of eating  ===================================== Crush medication and put with puree   =============================================== Signs of Aspiration Pneumonia   Chest pain/tightness Fever (can be low grade) Cough  With foul-smelling phlegm (sputum) With sputum containing pus or blood With greenish sputum Fatigue  Shortness of breath  Wheezing   **IF YOU HAVE THESE SIGNS, CONTACT YOUR DOCTOR OR GO TO THE EMERGENCY DEPARTMENT OR URGENT CARE AS SOON AS POSSIBLE**

## 2022-06-08 NOTE — Therapy (Signed)
OUTPATIENT SPEECH LANGUAGE PATHOLOGY SWALLOW EVALUATION   Patient Name: Nathaniel Long MRN: [id number] DOB:1941-07-15, 81 y.o., male Today's Date: 06/08/2022  PCP: [name], MD REFERRING PROVIDER: Eppie Gibson, MD   End of Session - 06/08/22 1331     Visit Number 2    Number of Visits 4    Date for SLP Re-Evaluation 08/06/22    SLP Start Time 1256    SLP Stop Time  1333    SLP Time Calculation (min) 37 min    Activity Tolerance Patient tolerated treatment well             Past Medical History:  Diagnosis Date   Anxiety    Cancer ([place])    lung   Carotid artery stenosis    Cataract    Chronic lower back pain    CKD (chronic kidney disease)    Complication of anesthesia    "can't pee when I come out of OR" (07/18/2017)   Contact with chainsaw as cause of accidental injury 07/16/2017   sternal fracture with bilateral anterior fifth and sixth rib fractures /notes 07/16/2017   COPD (chronic obstructive pulmonary disease) ([place])    Dyspnea    GERD (gastroesophageal reflux disease)    Hard of hearing    High cholesterol    Hypertension    Peripheral vascular disease ([place])    Thoracic ascending aortic aneurysm [facility])    Past Surgical History:  Procedure Laterality Date   ABDOMINAL AORTOGRAM W/LOWER EXTREMITY N/A 05/01/2017   Procedure: Abdominal Aortogram w/Lower Extremity;  Surgeon: Adrian Prows, MD;  Location: [place] [facility];  Service: Cardiovascular;  Laterality: N/A;   BACK SURGERY     FINGER REPLANTATION Left    "ring finger"   HEMORRHOID SURGERY     POSTERIOR LUMBAR FUSION  1986   RENAL ANGIOGRAPHY Bilateral 12/18/2017   Procedure: RENAL ANGIOGRAPHY;  Surgeon: Nigel Mormon, MD;  Location: [place] [facility];  Service: Cardiovascular;  Laterality: Bilateral;   VIDEO ASSISTED THORACOSCOPY (VATS)/ LOBECTOMY Left 09/19/2017   Procedure: LEFT VIDEO ASSISTED THORACOSCOPY (VATS)/ LEFT UPPER LOBECTOMY, LYMPH NODE DISECTION;  Surgeon: Melrose Nakayama,  MD;  Location: [place];  Service: Thoracic;  Laterality: Left;   VIDEO BRONCHOSCOPY WITH ENDOBRONCHIAL NAVIGATION N/A 09/05/2017   Procedure: VIDEO BRONCHOSCOPY WITH ENDOBRONCHIAL NAVIGATION;  Surgeon: Collene Gobble, MD;  Location: [place];  Service: Thoracic;  Laterality: N/A;   VIDEO BRONCHOSCOPY WITH ENDOBRONCHIAL ULTRASOUND N/A 09/05/2017   Procedure: VIDEO BRONCHOSCOPY WITH ENDOBRONCHIAL ULTRASOUND;  Surgeon: Collene Gobble, MD;  Location: [place];  Service: Thoracic;  Laterality: N/A;   Patient Active Problem List   Diagnosis Date Noted   Encounter for preoperative dental examination 04/17/2022   Teeth missing 04/17/2022   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   Accretions on teeth 04/17/2022   Generalized gingival recession 04/17/2022   Excessive dental attrition 04/17/2022   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   Hematoma of right ankle 10/28/2021   GI bleed 06/18/2020   COVID-19 virus infection 06/18/2020   Abdominal pain 06/18/2020   CKD (chronic kidney disease) stage 3, GFR 30-59 ml/min ([place]) 06/18/2020   COPD (chronic obstructive pulmonary disease) ([place]) 06/18/2020   Hard of hearing 06/18/2020   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   Right renal artery stenosis ([place]) 12/17/2017   Hydropneumothorax 09/28/2017   S/P lobectomy of lung 09/19/2017   Malignant neoplasm of upper lobe of left lung ([place]) 09/13/2017   Pulmonary nodule, left 09/05/2017   Mediastinal lymphadenopathy  09/05/2017   Multiple rib fractures 07/16/2017   Spondylolisthesis, lumbar region 06/18/2017   Right hip pain 05/23/2017   Chronic bilateral low back pain with bilateral sciatica 05/23/2017   Colon cancer screening 01/12/2017   Antiplatelet or antithrombotic long-term use 01/12/2017   PAD (peripheral artery disease) ([place]) 04/13/2011   Murmur, heart 12/08/2010   ACTINIC KERATOSIS, HEAD 07/14/2010   BRUISED ANKLE 07/14/2010   HYPERTENSION, BENIGN 11/19/2009   TOBACCO ABUSE 10/28/2009   EAR PAIN,  LEFT 10/28/2009    ONSET DATE: Approx 12 months   REFERRING DIAG: 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 male who presented with the cc of ongoing left ear pain and throat pain to Dr. West Carbo on 02/23/22. Laryngoscopy performed by Dr. West Carbo during that visit revealed a mass on the posterior pharyngeal wall noted as slightly friable in appearance. Normal vocal cord mobility was appreciated without vocal cord nodule, mass, polyp or tumor. Hypopharynx also appeared normal without mass, pooling of secretions or aspiration.CT neck/thyroid w cont 03-07-22 showed:  No evidence of laryngeal mass. Question abnormal appearance of the  upper cervical esophagus, which may show mild thickening with adjacent edema, which could go along with esophagitis. No definite adenopathy. Single slightly prominent level 2 level 3 junction lymph node on the left with short axis dimension of 9-10 mm.  Bilateral carotid artery atherosclerotic disease with ICA stenoses  of 50-70%, not precisely evaluated using this technique. 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 The patient followed up with Dr. Redmond Baseman on 03/16/22 to discuss treatment options. Options including surgical resection with reconstruction and radiation treatment, vs primary chemoradiation were discussed with the patient. The patient will make a final decision following our consultation today and with medical oncology. Dr. Redmond Baseman also [facility] the [facility] management - pt chose radiation. 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 Recent chest CT showed no evidence of local recurrence or metastatic disease. " Pt began radiation on Thursday 05/04/22.  PAIN:  Are you having pain? No  FALLS: Has patient fallen in last 6 months?  No  LIVING ENVIRONMENT: Lives with: lives with their spouse Lives in: House/apartment  PLOF:  Level of assistance: Independent with ADLs Employment: Retired   PATIENT GOALS Maintain swallow function  OBJECTIVE:   COGNITION: Overall cognitive status: Within functional limits for tasks assessed  ORAL MOTOR EXAMINATION Overall status: WFL  CLINICAL SWALLOW ASSESSMENT:   Current diet: Dysphagia 2 (chopped/minced), Dysphagia 1 (puree), and thin liquids Patient directly observed with POs: Yes: dysphagia 1 (puree) and thin liquids  Feeding: able to feed self Liquids provided by: cup Oral phase signs and symptoms:  none noted Pharyngeal phase signs and symptoms: multiple swallows, wet vocal quality, immediate throat clear, and complaints of residue SLP had pt turn his head to lt with applesauce and water and pt stated that strategy resulted in incr'd pharyngeal stasis. SLP then had pt turn head to rt with applesauce and then water boluses and pt stated, "That was much easier." SLP  confirmed that pt felt he had less pharyngeal stasis with those POs than when head straight ahead and pt confirmed that was the case. SLP told pt to turn head to rt with all POs, and that SLP was recommending modified barium swallow (MBSS) exam to assess pt's swallowing objectively.     TODAY'S TREATMENT:  06/08/22: Pt with thick saliva reportedly thinned/cleared with use of Mucinex since 10-14 days ago. "My swallowing has totally changed," pt stated. Today he ate [place] sandwich, and drank thin liquids without overt s/s of oral difficulty other than prolonged mastication which pt states is  what he does to improve pharyngeal transit. Suspected more difficult but manageable pharyngeal transit given fine mastication. DIL Sharyn Lull also agrees pt without overt s/sx aspiration during meals she has seen pt eat in the last ~14 days.  HEP completed today for usual mod A for tongue protrusion with [place]. Pt req'd fading cues to min A occasionally by sesison end. He was encouraged to complete Masako in front of a mirror for ensuring proper procedure.  SLP also reviewed precautions from [facility] on 05-24-22 and pt stated, "I'm pretty much doing all that." With POs pt not always swallowing additional swallows and so SLP cued pt to do so. After this pt adhered to mulitple swallows.   (05/09/22 - eval) Research states the risk for dysphagia increases due to radiation ttreatment due to a variety of factors, so SLP educated the pt about the possibility of reduced/limited ability for PO intake during rad tx. SLP also educated pt regarding possible changes to swallowing musculature after rad tx, and why adherence to dysphagia HEP provided today and PO consumption was necessary to inhibit muscle fibrosis following rad tx and to mitigate muscle disuse atrophy. SLP informed pt why this would be detrimental to his swallowing status and to their pulmonary health. Pt demonstrated understanding of these things to SLP. SLP encouraged pt to safely eat and drink as deep into their radiation/chemotherapy as possible to provide the best possible long-term swallowing outcome for pt.    SLP then developed an individualized HEP for pt involving oral and pharyngeal strengthening and ROM and pt was instructed how to perform these exercises, including SLP demonstration. After SLP demonstration, pt return demonstrated each exercise. SLP ensured pt performance was correct prior to educating pt on next exercise. Pt is extremely hard of hearing so SLP used speech to text function on SLP's phone to ensure pt understanding. Pt required SBA to  perform HEP. "Dub" was instructed to complete this program 6-7 days/week, at least 2 times a day until 6 months after his last day of rad tx, and then x2 a week after that, indefinitely. Among other modifications for days when pt cannot functionally swallow, SLP also suggested pt cycle through the HEP so the full program of exercises can be completed instead of fatiguing on one of the swallowing exercises and being unable to perform the other swallowing exercises.    PATIENT EDUCATION: Education details: as above in "today's treatment" Person educated: Patient, daughter in Set designer Education method: Explanation, Speech-to-text, Demonstration, Verbal cues, and Handouts Education comprehension: verbalized understanding, returned demonstration, verbal cues required, and needs further education   ASSESSMENT:  CLINICAL IMPRESSION: Patient is a 81 y.o. male who was seen today for treatment for swallowing as he undergoes radiation therapy. Today pt ate [place] sandwich, and drank thin liquids without overt s/s oral difficulty other than prolonged mastication which pt states is due to improving pharyngeal transit. Pharyngeal transit  appears functional at this time with pt using thorough mastication. Pt remains without overt s/s aspiration PNA nor any reported by pt at this time. These were provided in writing to pt today. At this time pt swallowing is deemed [facility] with precautions listed in "pt instructions", from his MBSS on 05-24-22. Data indicate that pt's swallow ability will likely decrease even more over the course of radiation therapy and could very well decline over time following the conclusion of that therapy due to muscle disuse atrophy and/or muscle fibrosis. Pt will cont to need to be seen by SLP in order to assess safety of PO intake, assess the need for recommending any objective swallow assessment, and ensuring pt is correctly completing the individualized HEP.   OBJECTIVE IMPAIRMENTS include  dysphagia. These impairments are limiting patient from safety when swallowing. Factors affecting potential to achieve goals and functional outcome are pre-radiation swallowing difficulty . Patient will benefit from skilled SLP services to address above impairments and improve overall function.   REHAB POTENTIAL: Good   GOALS: Goals reviewed with patient? No  SHORT TERM GOALS: Target date:  2 sessions (3rd total session)     pt will complete HEP with rare min A  Baseline: Goal status: Ongoing   2.  pt will tell SLP why pt is completing HEP with modified independence Baseline:  Goal status: Ongoing   3.  pt will describe 3 overt s/s aspiration PNA with modified independence Baseline:  Goal status: Ongoing   4.  pt will tell SLP how a food journal could hasten return to a more normalized diet Baseline:  Goal status: Ongoing   LONG TERM GOALS: Target date:  6th session   (7th total session)  pt will complete HEP with modified independnence in 3 sessions Baseline:  Goal status: Ongoing   2.  pt will describe how to modify HEP over time, and the timeline associated with reduction in HEP frequency with modified independence over two sessions Baseline:  Goal status: Ongoing   PLAN: SLP FREQUENCY: approx 1x/month  SLP DURATION:  7 sessions  PLANNED INTERVENTIONS: Aspiration precaution training, Pharyngeal strengthening exercises, Diet toleration management , Environmental controls, Trials of upgraded texture/liquids, Cueing hierachy, Internal/external aids, Oral motor exercises, SLP instruction and feedback, Compensatory strategies, Patient/family education, and MBSS    Ryland Tungate, CCC-SLP 06/08/2022, 1:33 PM

## 2022-06-08 NOTE — Progress Notes (Signed)
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  Final radiation therapy treatment scheduled for Friday, September 22.  Weight stable and documented as 159.2 pounds.  Patient denies nutrition impact symptoms.  Reports he is eating soft foods and drinking Ensure Plus as needed.  States he feels well overall.  Nutrition diagnosis: Food and nutrition related knowledge deficit improved.  Intervention: Encourage patient to continue strategies for weight maintenance including high-calorie, high-protein foods as tolerated. Continue Ensure Plus high-protein 1-2 cartons daily.  Monitoring, evaluation, goals: Patient will continue to consume adequate calories and protein for weight maintenance.  Next visit: Thursday, September 14 after radiation therapy.  **Disclaimer: This note was dictated with voice recognition software. Similar sounding words can inadvertently be transcribed and this note may contain transcription errors which may not have been corrected upon publication of note.**

## 2022-06-09 ENCOUNTER — Other Ambulatory Visit: Payer: Self-pay

## 2022-06-09 ENCOUNTER — Ambulatory Visit
Admission: RE | Admit: 2022-06-09 | Discharge: 2022-06-09 | Disposition: A | Discharge status: Home / Self Care | Payer: Medicare Other | Source: Ambulatory Visit | Attending: Radiation Oncology | Admitting: Radiation Oncology

## 2022-06-09 DIAGNOSIS — C140 Malignant neoplasm of pharynx, unspecified: Secondary | ICD-10-CM | POA: Diagnosis not present

## 2022-06-09 LAB — RAD ONC ARIA SESSION SUMMARY
Course Elapsed Days: 36
Plan Fractions Treated to Date: 25
Plan Prescribed Dose Per Fraction: 2 Gy
Plan Total Fractions Prescribed: 35
Plan Total Prescribed Dose: 70 Gy
Reference Point Dosage Given to Date: 50 Gy
Reference Point Session Dosage Given: 2 Gy
Session Number: 25

## 2022-06-12 ENCOUNTER — Other Ambulatory Visit: Payer: Self-pay

## 2022-06-12 ENCOUNTER — Ambulatory Visit: Payer: Medicare Other

## 2022-06-12 ENCOUNTER — Ambulatory Visit
Admission: RE | Admit: 2022-06-12 | Discharge: 2022-06-12 | Disposition: A | Discharge status: Home / Self Care | Payer: Medicare Other | Source: Ambulatory Visit | Attending: Radiation Oncology | Admitting: Radiation Oncology

## 2022-06-12 DIAGNOSIS — C140 Malignant neoplasm of pharynx, unspecified: Secondary | ICD-10-CM | POA: Diagnosis not present

## 2022-06-12 LAB — RAD ONC ARIA SESSION SUMMARY
Course Elapsed Days: 39
Plan Fractions Treated to Date: 26
Plan Prescribed Dose Per Fraction: 2 Gy
Plan Total Fractions Prescribed: 35
Plan Total Prescribed Dose: 70 Gy
Reference Point Dosage Given to Date: 52 Gy
Reference Point Session Dosage Given: 2 Gy
Session Number: 26

## 2022-06-13 ENCOUNTER — Other Ambulatory Visit: Payer: Self-pay

## 2022-06-13 ENCOUNTER — Ambulatory Visit
Admission: RE | Admit: 2022-06-13 | Discharge: 2022-06-13 | Disposition: A | Discharge status: Home / Self Care | Payer: Medicare Other | Source: Ambulatory Visit | Attending: Radiation Oncology | Admitting: Radiation Oncology

## 2022-06-13 DIAGNOSIS — C140 Malignant neoplasm of pharynx, unspecified: Secondary | ICD-10-CM | POA: Diagnosis not present

## 2022-06-13 LAB — RAD ONC ARIA SESSION SUMMARY
Course Elapsed Days: 40
Plan Fractions Treated to Date: 27
Plan Prescribed Dose Per Fraction: 2 Gy
Plan Total Fractions Prescribed: 35
Plan Total Prescribed Dose: 70 Gy
Reference Point Dosage Given to Date: 54 Gy
Reference Point Session Dosage Given: 2 Gy
Session Number: 27

## 2022-06-14 ENCOUNTER — Other Ambulatory Visit: Payer: Self-pay

## 2022-06-14 ENCOUNTER — Ambulatory Visit
Admission: RE | Admit: 2022-06-14 | Discharge: 2022-06-14 | Disposition: A | Discharge status: Home / Self Care | Payer: Medicare Other | Source: Ambulatory Visit | Attending: Radiation Oncology | Admitting: Radiation Oncology

## 2022-06-14 DIAGNOSIS — C140 Malignant neoplasm of pharynx, unspecified: Secondary | ICD-10-CM | POA: Diagnosis not present

## 2022-06-14 LAB — RAD ONC ARIA SESSION SUMMARY
Course Elapsed Days: 41
Plan Fractions Treated to Date: 28
Plan Prescribed Dose Per Fraction: 2 Gy
Plan Total Fractions Prescribed: 35
Plan Total Prescribed Dose: 70 Gy
Reference Point Dosage Given to Date: 56 Gy
Reference Point Session Dosage Given: 2 Gy
Session Number: 28

## 2022-06-15 ENCOUNTER — Other Ambulatory Visit: Payer: Self-pay

## 2022-06-15 ENCOUNTER — Ambulatory Visit
Admission: RE | Admit: 2022-06-15 | Discharge: 2022-06-15 | Disposition: A | Discharge status: Home / Self Care | Payer: Medicare Other | Source: Ambulatory Visit | Attending: Radiation Oncology | Admitting: Radiation Oncology

## 2022-06-15 ENCOUNTER — Inpatient Hospital Stay: Payer: Medicare Other | Admitting: Nutrition

## 2022-06-15 DIAGNOSIS — C140 Malignant neoplasm of pharynx, unspecified: Secondary | ICD-10-CM | POA: Diagnosis not present

## 2022-06-15 LAB — RAD ONC ARIA SESSION SUMMARY
Course Elapsed Days: 42
Plan Fractions Treated to Date: 29
Plan Prescribed Dose Per Fraction: 2 Gy
Plan Total Fractions Prescribed: 35
Plan Total Prescribed Dose: 70 Gy
Reference Point Dosage Given to Date: 58 Gy
Reference Point Session Dosage Given: 2 Gy
Session Number: 29

## 2022-06-15 NOTE — Progress Notes (Signed)
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  Final radiation therapy is scheduled for Friday, September 22.  Weight improved to 160.2 pounds from 159.6 pounds September 11.  Patient reports phlegm is thinner by using Mucinex.  He is using baking soda and salt water rinses.  States he and his wife have been eating out more.  He continues to drink Ensure Plus.  Requesting an additional case today.  Nutrition diagnosis:  Food and nutrition related knowledge deficit improved.  Intervention: Continue using baking soda and salt water rinses frequently throughout the day. Encouraged adequate fluid intake. Continue Ensure Plus high-protein 1-2 cartons daily as needed. Provided 1 additional complementary case of Ensure Plus high-protein.  Monitoring, evaluation, goals: Patient will continue adequate calorie and protein intake for weight maintenance.  Next visit: Thursday, September 21 after radiation therapy.  **Disclaimer: This note was dictated with voice recognition software. Similar sounding words can inadvertently be transcribed and this note may contain transcription errors which may not have been corrected upon publication of note.**

## 2022-06-16 ENCOUNTER — Other Ambulatory Visit: Payer: Self-pay

## 2022-06-16 ENCOUNTER — Ambulatory Visit
Admission: RE | Admit: 2022-06-16 | Discharge: 2022-06-16 | Disposition: A | Discharge status: Home / Self Care | Payer: Medicare Other | Source: Ambulatory Visit | Attending: Radiation Oncology | Admitting: Radiation Oncology

## 2022-06-16 DIAGNOSIS — C140 Malignant neoplasm of pharynx, unspecified: Secondary | ICD-10-CM | POA: Diagnosis not present

## 2022-06-16 LAB — RAD ONC ARIA SESSION SUMMARY
Course Elapsed Days: 43
Plan Fractions Treated to Date: 30
Plan Prescribed Dose Per Fraction: 2 Gy
Plan Total Fractions Prescribed: 35
Plan Total Prescribed Dose: 70 Gy
Reference Point Dosage Given to Date: 60 Gy
Reference Point Session Dosage Given: 2 Gy
Session Number: 30

## 2022-06-19 ENCOUNTER — Ambulatory Visit
Admission: RE | Admit: 2022-06-19 | Discharge: 2022-06-19 | Disposition: A | Discharge status: Home / Self Care | Payer: Medicare Other | Source: Ambulatory Visit | Attending: Radiation Oncology | Admitting: Radiation Oncology

## 2022-06-19 ENCOUNTER — Other Ambulatory Visit: Payer: Self-pay

## 2022-06-19 DIAGNOSIS — C140 Malignant neoplasm of pharynx, unspecified: Secondary | ICD-10-CM | POA: Diagnosis not present

## 2022-06-19 LAB — RAD ONC ARIA SESSION SUMMARY
Course Elapsed Days: 46
Plan Fractions Treated to Date: 31
Plan Prescribed Dose Per Fraction: 2 Gy
Plan Total Fractions Prescribed: 35
Plan Total Prescribed Dose: 70 Gy
Reference Point Dosage Given to Date: 62 Gy
Reference Point Session Dosage Given: 2 Gy
Session Number: 31

## 2022-06-20 ENCOUNTER — Other Ambulatory Visit: Payer: Self-pay

## 2022-06-20 ENCOUNTER — Ambulatory Visit
Admission: RE | Admit: 2022-06-20 | Discharge: 2022-06-20 | Disposition: A | Discharge status: Home / Self Care | Payer: Medicare Other | Source: Ambulatory Visit | Attending: Radiation Oncology | Admitting: Radiation Oncology

## 2022-06-20 DIAGNOSIS — C140 Malignant neoplasm of pharynx, unspecified: Secondary | ICD-10-CM | POA: Diagnosis not present

## 2022-06-20 LAB — RAD ONC ARIA SESSION SUMMARY
Course Elapsed Days: 47
Plan Fractions Treated to Date: 32
Plan Prescribed Dose Per Fraction: 2 Gy
Plan Total Fractions Prescribed: 35
Plan Total Prescribed Dose: 70 Gy
Reference Point Dosage Given to Date: 64 Gy
Reference Point Session Dosage Given: 2 Gy
Session Number: 32

## 2022-06-21 ENCOUNTER — Other Ambulatory Visit: Payer: Self-pay

## 2022-06-21 ENCOUNTER — Ambulatory Visit
Admission: RE | Admit: 2022-06-21 | Discharge: 2022-06-21 | Disposition: A | Discharge status: Home / Self Care | Payer: Medicare Other | Source: Ambulatory Visit | Attending: Radiation Oncology | Admitting: Radiation Oncology

## 2022-06-21 DIAGNOSIS — C140 Malignant neoplasm of pharynx, unspecified: Secondary | ICD-10-CM | POA: Diagnosis not present

## 2022-06-21 LAB — RAD ONC ARIA SESSION SUMMARY
Course Elapsed Days: 48
Plan Fractions Treated to Date: 33
Plan Prescribed Dose Per Fraction: 2 Gy
Plan Total Fractions Prescribed: 35
Plan Total Prescribed Dose: 70 Gy
Reference Point Dosage Given to Date: 66 Gy
Reference Point Session Dosage Given: 2 Gy
Session Number: 33

## 2022-06-22 ENCOUNTER — Encounter: Payer: Self-pay | Admitting: Nutrition

## 2022-06-22 ENCOUNTER — Inpatient Hospital Stay: Payer: Medicare Other | Admitting: Nutrition

## 2022-06-22 ENCOUNTER — Ambulatory Visit
Admission: RE | Admit: 2022-06-22 | Discharge: 2022-06-22 | Disposition: A | Discharge status: Home / Self Care | Payer: Medicare Other | Source: Ambulatory Visit | Attending: Radiation Oncology | Admitting: Radiation Oncology

## 2022-06-22 ENCOUNTER — Other Ambulatory Visit: Payer: Self-pay

## 2022-06-22 DIAGNOSIS — C140 Malignant neoplasm of pharynx, unspecified: Secondary | ICD-10-CM | POA: Diagnosis not present

## 2022-06-22 LAB — RAD ONC ARIA SESSION SUMMARY
Course Elapsed Days: 49
Plan Fractions Treated to Date: 34
Plan Prescribed Dose Per Fraction: 2 Gy
Plan Total Fractions Prescribed: 35
Plan Total Prescribed Dose: 70 Gy
Reference Point Dosage Given to Date: 68 Gy
Reference Point Session Dosage Given: 2 Gy
Session Number: 34

## 2022-06-22 NOTE — Progress Notes (Signed)
Final nutrition follow-up completed with patient and daughter.  Patient completes radiation therapy tomorrow, Friday, September 22 for hypopharyngeal cancer.  Weight stable and documented as 159.8 pounds.  Patient denies changes in nutrition impact symptoms.  He still has thickened saliva but is using baking soda and salt water rinses.  Patient does not have much taste but forces himself to eat anyway.  He is consuming 2 Ensure Plus daily.  He is requesting his final case today.  Nutrition diagnosis: Food and nutrition related knowledge deficit resolved.  Provided support and encouragement.  Suggested patient continue Ensure Plus high-protein to supplement oral intake until mouth is well-healed and he is eating regular textures.   Provided 1 complementary case of Ensure Plus high-protein. Encouraged him to contact RD for questions or concerns.  **Disclaimer: This note was dictated with voice recognition software. Similar sounding words can inadvertently be transcribed and this note may contain transcription errors which may not have been corrected upon publication of note.**

## 2022-06-23 ENCOUNTER — Encounter: Payer: Self-pay | Admitting: Radiation Oncology

## 2022-06-23 ENCOUNTER — Ambulatory Visit
Admission: RE | Admit: 2022-06-23 | Discharge: 2022-06-23 | Disposition: A | Discharge status: Home / Self Care | Payer: Medicare Other | Source: Ambulatory Visit | Attending: Radiation Oncology | Admitting: Radiation Oncology

## 2022-06-23 ENCOUNTER — Other Ambulatory Visit: Payer: Self-pay

## 2022-06-23 DIAGNOSIS — C140 Malignant neoplasm of pharynx, unspecified: Secondary | ICD-10-CM | POA: Diagnosis not present

## 2022-06-23 LAB — RAD ONC ARIA SESSION SUMMARY
Course Elapsed Days: 50
Plan Fractions Treated to Date: 35
Plan Prescribed Dose Per Fraction: 2 Gy
Plan Total Fractions Prescribed: 35
Plan Total Prescribed Dose: 70 Gy
Reference Point Dosage Given to Date: 70 Gy
Reference Point Session Dosage Given: 2 Gy
Session Number: 35

## 2022-06-23 NOTE — Progress Notes (Signed)
Oncology Nurse Navigator Documentation   Nathaniel Long completed his radiation treatments today. He will follow up with Dr. Isidore Moos on 07/11/22 and will also see SLP and PT in October. He and is family know to call me if they have any questions or concerns.  Harlow Asa RN, BSN, OCN Head & Neck Oncology Nurse [facility] Phone # [phone]  Fax # [phone]

## 2022-07-05 ENCOUNTER — Other Ambulatory Visit: Payer: Self-pay | Admitting: Radiation Oncology

## 2022-07-05 ENCOUNTER — Other Ambulatory Visit (HOSPITAL_COMMUNITY): Payer: Self-pay

## 2022-07-05 DIAGNOSIS — C140 Malignant neoplasm of pharynx, unspecified: Secondary | ICD-10-CM

## 2022-07-05 MED ORDER — IBUPROFEN 400 MG PO TABS
400.0000 mg | ORAL_TABLET | ORAL | 0 refills | Status: DC | PRN
  Filled 2022-07-05: qty 60, 10d supply, fill #0

## 2022-07-05 NOTE — Progress Notes (Signed)
                                                                                                                                                             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[place]) Staging form: Pharynx - P16 Negative Oropharynx, AJCC 8th Edition - Clinical stage from 04/25/2022: Stage III (cT3, cN1, cM0, p16-) - Signed by Eppie Gibson, MD on 04/25/2022 Stage prefix: Initial diagnosis  Malignant neoplasm of upper lobe of left lung [facility]) Staging form: Lung, AJCC 8th Edition - Clinical stage from 09/13/2017: Stage IA2 (cT1b, cN0, cM0) - Unsigned Laterality: Left Tumor size (mm): 16  Intent: Curative  Radiation Treatment Dates: 05/04/2022 through 06/23/2022 Site Technique Total Dose (Gy) Dose per Fx (Gy) Completed Fx Beam Energies  Neck: HN_pharynx IMRT 70/70 2 35/35 6X   Narrative: The patient tolerated radiation therapy relatively well.   Plan: The patient will follow-up with radiation oncology in half a mo.  -----------------------------------  Eppie Gibson, MD

## 2022-07-07 ENCOUNTER — Telehealth (HOSPITAL_COMMUNITY): Payer: Self-pay

## 2022-07-10 ENCOUNTER — Ambulatory Visit: Payer: Medicare Other | Attending: Radiation Oncology

## 2022-07-10 ENCOUNTER — Ambulatory Visit: Payer: Medicare Other | Attending: Radiation Oncology | Admitting: Physical Therapy

## 2022-07-10 ENCOUNTER — Other Ambulatory Visit: Payer: Self-pay

## 2022-07-10 DIAGNOSIS — C140 Malignant neoplasm of pharynx, unspecified: Secondary | ICD-10-CM | POA: Insufficient documentation

## 2022-07-10 DIAGNOSIS — R1313 Dysphagia, pharyngeal phase: Secondary | ICD-10-CM | POA: Insufficient documentation

## 2022-07-10 DIAGNOSIS — R293 Abnormal posture: Secondary | ICD-10-CM | POA: Diagnosis present

## 2022-07-10 NOTE — Therapy (Signed)
OUTPATIENT SPEECH LANGUAGE PATHOLOGY SWALLOW TREATMENT   Patient Name: Nathaniel Long MRN: [id number] DOB:Oct 23, 1940, 81 y.o., male Today's Date: 07/10/2022  PCP: [name], MD REFERRING PROVIDER: Eppie Gibson, MD   End of Session - 07/10/22 1957     Visit Number 3    Number of Visits 4    Date for SLP Re-Evaluation 08/06/22    SLP Start Time 69    SLP Stop Time  1660    SLP Time Calculation (min) 40 min    Activity Tolerance Patient tolerated treatment well              Past Medical History:  Diagnosis Date   Anxiety    Cancer ([place])    lung   Carotid artery stenosis    Cataract    Chronic lower back pain    CKD (chronic kidney disease)    Complication of anesthesia    "can't pee when I come out of OR" (07/18/2017)   Contact with chainsaw as cause of accidental injury 07/16/2017   sternal fracture with bilateral anterior fifth and sixth rib fractures /notes 07/16/2017   COPD (chronic obstructive pulmonary disease) ([place])    Dyspnea    GERD (gastroesophageal reflux disease)    Hard of hearing    High cholesterol    Hypertension    Peripheral vascular disease ([place])    Thoracic ascending aortic aneurysm [facility])    Past Surgical History:  Procedure Laterality Date   ABDOMINAL AORTOGRAM W/LOWER EXTREMITY N/A 05/01/2017   Procedure: Abdominal Aortogram w/Lower Extremity;  Surgeon: Adrian Prows, MD;  Location: [facility];  Service: Cardiovascular;  Laterality: N/A;   BACK SURGERY     FINGER REPLANTATION Left    "ring finger"   HEMORRHOID SURGERY     POSTERIOR LUMBAR FUSION  1986   RENAL ANGIOGRAPHY Bilateral 12/18/2017   Procedure: RENAL ANGIOGRAPHY;  Surgeon: Nigel Mormon, MD;  Location: [facility];  Service: Cardiovascular;  Laterality: Bilateral;   VIDEO ASSISTED THORACOSCOPY (VATS)/ LOBECTOMY Left 09/19/2017   Procedure: LEFT VIDEO ASSISTED THORACOSCOPY (VATS)/ LEFT UPPER LOBECTOMY, LYMPH NODE DISECTION;  Surgeon: Melrose Nakayama,  MD;  Location: [facility];  Service: Thoracic;  Laterality: Left;   VIDEO BRONCHOSCOPY WITH ENDOBRONCHIAL NAVIGATION N/A 09/05/2017   Procedure: VIDEO BRONCHOSCOPY WITH ENDOBRONCHIAL NAVIGATION;  Surgeon: Collene Gobble, MD;  Location: [place];  Service: Thoracic;  Laterality: N/A;   VIDEO BRONCHOSCOPY WITH ENDOBRONCHIAL ULTRASOUND N/A 09/05/2017   Procedure: VIDEO BRONCHOSCOPY WITH ENDOBRONCHIAL ULTRASOUND;  Surgeon: Collene Gobble, MD;  Location: [place];  Service: Thoracic;  Laterality: N/A;   Patient Active Problem List   Diagnosis Date Noted   Encounter for preoperative dental examination 04/17/2022   Teeth missing 04/17/2022   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   Accretions on teeth 04/17/2022   Generalized gingival recession 04/17/2022   Excessive dental attrition 04/17/2022   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/2022   Hematoma of right ankle 10/28/2021   GI bleed 06/18/2020   COVID-19 virus infection 06/18/2020   Abdominal pain 06/18/2020   CKD (chronic kidney disease) stage 3, GFR 30-59 ml/min ([place]) 06/18/2020   COPD (chronic obstructive pulmonary disease) ([place]) 06/18/2020   Hard of hearing 06/18/2020   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   Right renal artery stenosis ([place]) 12/17/2017   Hydropneumothorax 09/28/2017   S/P lobectomy of lung 09/19/2017   Malignant neoplasm of upper lobe of left lung ([place]) 09/13/2017   Pulmonary nodule, left 09/05/2017   Mediastinal  lymphadenopathy 09/05/2017   Multiple rib fractures 07/16/2017   Spondylolisthesis, lumbar region 06/18/2017   Right hip pain 05/23/2017   Chronic bilateral low back pain with bilateral sciatica 05/23/2017   Colon cancer screening 01/12/2017   Antiplatelet or antithrombotic long-term use 01/12/2017   PAD (peripheral artery disease) ([place]) 04/13/2011   Murmur, heart 12/08/2010   ACTINIC KERATOSIS, HEAD 07/14/2010   BRUISED ANKLE 07/14/2010   HYPERTENSION, BENIGN 11/19/2009   TOBACCO ABUSE 10/28/2009   EAR PAIN,  LEFT 10/28/2009    ONSET DATE: Approx 12 months   REFERRING DIAG: 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 Nothing tastes so I just force it." Pt enters with audible wheezing upon inhalation/exhalation. Pt accompanied by:  Sharyn Lull -Daughter in Law  PERTINENT HISTORY: "[name] is a 81 y.o. male who presented with the cc of ongoing left ear pain and throat pain to Dr. West Carbo on 02/23/22. Laryngoscopy performed by Dr. West Carbo during that visit revealed a mass on the posterior pharyngeal wall noted as slightly friable in appearance. Normal vocal cord mobility was appreciated without vocal cord nodule, mass, polyp or tumor. Hypopharynx also appeared normal without mass, pooling of secretions or aspiration.CT neck/thyroid w cont 03-07-22 showed:  No evidence of laryngeal mass. Question abnormal appearance of the  upper cervical esophagus, which may show mild thickening with adjacent edema, which could go along with esophagitis. No definite adenopathy. Single slightly prominent level 2 level 3 junction lymph node on the left with short axis dimension of 9-10 mm.  Bilateral carotid artery atherosclerotic disease with ICA stenoses  of 50-70%, not precisely evaluated using this technique. 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 The patient followed up with Dr. Redmond Baseman on 03/16/22 to discuss treatment options. Options including surgical resection with reconstruction and radiation treatment, vs primary chemoradiation were discussed with the patient. The patient will make a final decision following our consultation today and with medical oncology. Dr. Redmond Baseman also referred the patient to [facility] to discuss surgical  management - pt chose radiation. 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 Recent chest CT showed no evidence of local recurrence or metastatic disease. " Pt began radiation on Thursday 05/04/22.  PAIN:  Are you having pain? No  FALLS: Has patient fallen in last 6 months?  No  LIVING ENVIRONMENT: Lives with: lives with their spouse Lives in: House/apartment  PLOF:  Level of assistance: Independent with ADLs Employment: Retired   PATIENT GOALS Maintain swallow function  OBJECTIVE:    TODAY'S TREATMENT:  07/10/22: ST took place with SLP writing text as pt has profound hearing impairment. Pt with wide variety of food items in last 2-3 days however he also has wheezing with inhalation and exhalation. POs resulted in coughing immediately after swallow x2/7 bites of cereal bar, one rather violently. Pt took sips of water from cup. Pt did not follow any of swallow precautions from [place] in August. SLP cued pt to follow precautions and strongly urged pt to do so from this time onward. Due to pt's coughing with POs and not following precautions, SLP recommends follow up MBS. Noted from last session pt without any overt s/sx aspiration during  POs. In August MBS, pt showed some sensation to aspiration. SLP reviewed reasoning for SLP suggesting MBS for pt - pt demonstrated understanding. Pt has not been completing HEP as prescribed. "I'm eating all the time and so I figured I didn't need to do them." SLP corrected pt and provided rationale for HEP including possible end point of not completing HEP (hospitalization for PNA). "I don't want a hospital stay," pt stated. SLP provided usual max A for exercises. As below, last session pt's cueing level was less - suggesting that pt has likely not been completing HEP very little since last session.   06/08/22: Pt with thick saliva  reportedly thinned/cleared with use of Mucinex since 10-14 days ago. "My swallowing has totally changed," pt stated. Today he ate [place] sandwich, and drank thin liquids without overt s/s of oral difficulty other than prolonged mastication which pt states is what he does to improve pharyngeal transit. Suspected more difficult but manageable pharyngeal transit given fine mastication. DIL Sharyn Lull also agrees pt without overt s/sx aspiration during meals she has seen pt eat in the last ~14 days.  HEP completed today for usual mod A for tongue protrusion with [place]. Pt req'd fading cues to min A occasionally by sesison end. He was encouraged to complete Masako in front of a mirror for ensuring proper procedure.  SLP also reviewed precautions from [facility] on 05-24-22 and pt stated, "I'm pretty much doing all that." With POs pt not always swallowing additional swallows and so SLP cued pt to do so. After this pt adhered to mulitple swallows.   (05/09/22 - eval) Research states the risk for dysphagia increases due to radiation ttreatment due to a variety of factors, so SLP educated the pt about the possibility of reduced/limited ability for PO intake during rad tx. SLP also educated pt regarding possible changes to swallowing musculature after rad tx, and why adherence to dysphagia HEP provided today and PO consumption was necessary to inhibit muscle fibrosis following rad tx and to mitigate muscle disuse atrophy. SLP informed pt why this would be detrimental to his swallowing status and to their pulmonary health. Pt demonstrated understanding of these things to SLP. SLP encouraged pt to safely eat and drink as deep into their radiation/chemotherapy as possible to provide the best possible long-term swallowing outcome for pt.    SLP then developed an individualized HEP for pt involving oral and pharyngeal strengthening and ROM and pt was instructed how to perform these exercises, including SLP demonstration. After SLP  demonstration, pt return demonstrated each exercise. SLP ensured pt performance was correct prior to educating pt on next exercise. Pt is extremely hard of hearing so SLP used speech to text function on SLP's phone to ensure pt understanding. Pt required SBA to perform HEP. "Dub" was instructed to complete this program 6-7 days/week, at least 2 times a day until 6 months after his last day of rad tx, and then x2 a week after that, indefinitely. Among other modifications for days when pt cannot functionally swallow, SLP also suggested pt cycle through the HEP so the full program of exercises can be completed instead of fatiguing on one of the swallowing exercises and being unable to perform the other swallowing exercises.    PATIENT EDUCATION: Education details: as above in "today's treatment" Person educated: Patient, daughter in Set designer Education method: Explanation, Speech-to-text, Demonstration, Verbal cues, and Handouts Education comprehension: verbalized understanding, returned demonstration, verbal cues required, and needs further education   ASSESSMENT:  CLINICAL  IMPRESSION: Patient is a 81 y.o. male who was seen today for treatment for swallowing as he undergoes radiation therapy. Today pt ate cereal bar, and drank thin liquids with coughing on approx 25% of solid boluses. SLP suggested MBS to referring MD given pt's overt s/sx aspiration and of aspiration PNA, and pt not following aspiration precautions from [facility] 05/24/22. At this time pt swallowing is deemed [facility] *with precautions listed* from his MBSS on 05-24-22. Data indicate that pt's swallow ability will likely decrease even more over the course of radiation therapy and could very well decline over time following the conclusion of that therapy due to muscle disuse atrophy and/or muscle fibrosis. Pt will cont to need to be seen by SLP in order to assess safety of PO intake, assess the need for recommending any objective swallow assessment,  and ensuring pt is correctly completing the individualized HEP.   OBJECTIVE IMPAIRMENTS include dysphagia. These impairments are limiting patient from safety when swallowing. Factors affecting potential to achieve goals and functional outcome are pre-radiation swallowing difficulty . Patient will benefit from skilled SLP services to address above impairments and improve overall function.   REHAB POTENTIAL: Good   GOALS: Goals reviewed with patient? No  SHORT TERM GOALS: Target date:  2 sessions (3rd total session)     pt will complete HEP with rare min A  Baseline: Goal status: Not met   2.  pt will tell SLP why pt is completing HEP with modified independence Baseline:  Goal status: Not met   3.  pt will describe 3 overt s/s aspiration PNA with modified independence Baseline:  Goal status: Met   4.  pt will tell SLP how a food journal could hasten return to a more normalized diet Baseline:  Goal status: Deferred due to pt eating his desired diet   LONG TERM GOALS: Target date:  6th session   (7th total session)  pt will complete HEP with modified independnence in 3 sessions Baseline:  Goal status: Ongoing   2.  pt will describe how to modify HEP over time, and the timeline associated with reduction in HEP frequency with modified independence over two sessions Baseline:  Goal status: Ongoing   PLAN: SLP FREQUENCY: approx 1x/month. MBS recommended.  SLP DURATION:  7 sessions  PLANNED INTERVENTIONS: Aspiration precaution training, Pharyngeal strengthening exercises, Diet toleration management , Environmental controls, Trials of upgraded texture/liquids, Cueing hierachy, Internal/external aids, Oral motor exercises, SLP instruction and feedback, Compensatory strategies, Patient/family education, and MBSS    [place], [place] 07/10/2022, 7:57 PM

## 2022-07-10 NOTE — Progress Notes (Signed)
Oncology Nurse Navigator Documentation   I spoke with Nathaniel Long, Nathaniel Long' daughter in law. I let her know that Dr. Isidore Moos would like him to have a CXR before he sees her tomorrow for follow up based on Nathaniel Long's concern of hearing rattling in his lungs during his appointment with him today. She voiced her understanding and knows to arrive at 1:00 tomorrow for a chest x ray before his 2:00 follow up with Dr. Isidore Moos. She knows to call me if she has any questions.  Harlow Asa RN, BSN, OCN Head & Neck Oncology Nurse [place] [facility] Phone # [phone]  Fax # [phone]

## 2022-07-10 NOTE — Therapy (Signed)
OUTPATIENT PHYSICAL THERAPY HEAD AND NECK POST RADIATION FOLLOW UP   Patient Name: Nathaniel Long MRN: [id number] DOB:02/27/1941, 81 y.o., male Today's Date: 07/10/2022   PT End of Session - 07/10/22 1159     Visit Number 2    Number of Visits 2    Date for PT Re-Evaluation 07/24/22    PT Start Time 1117    PT Stop Time 49    PT Time Calculation (min) 38 min    Activity Tolerance Patient tolerated treatment well    Behavior During Therapy [facility] for tasks assessed/performed             Past Medical History:  Diagnosis Date   Anxiety    Cancer ([place])    lung   Carotid artery stenosis    Cataract    Chronic lower back pain    CKD (chronic kidney disease)    Complication of anesthesia    "can't pee when I come out of OR" (07/18/2017)   Contact with chainsaw as cause of accidental injury 07/16/2017   sternal fracture with bilateral anterior fifth and sixth rib fractures /notes 07/16/2017   COPD (chronic obstructive pulmonary disease) ([place])    Dyspnea    GERD (gastroesophageal reflux disease)    Hard of hearing    High cholesterol    Hypertension    Peripheral vascular disease ([place])    Thoracic ascending aortic aneurysm ([place])    Past Surgical History:  Procedure Laterality Date   ABDOMINAL AORTOGRAM W/LOWER EXTREMITY N/A 05/01/2017   Procedure: Abdominal Aortogram w/Lower Extremity;  Surgeon: Adrian Prows, MD;  Location: [place] [facility];  Service: Cardiovascular;  Laterality: N/A;   BACK SURGERY     FINGER REPLANTATION Left    "ring finger"   HEMORRHOID SURGERY     POSTERIOR LUMBAR FUSION  1986   RENAL ANGIOGRAPHY Bilateral 12/18/2017   Procedure: RENAL ANGIOGRAPHY;  Surgeon: Nigel Mormon, MD;  Location: [place] [facility];  Service: Cardiovascular;  Laterality: Bilateral;   VIDEO ASSISTED THORACOSCOPY (VATS)/ LOBECTOMY Left 09/19/2017   Procedure: LEFT VIDEO ASSISTED THORACOSCOPY (VATS)/ LEFT UPPER LOBECTOMY, LYMPH NODE DISECTION;  Surgeon: Melrose Nakayama, MD;  Location: [place];  Service: Thoracic;  Laterality: Left;   VIDEO BRONCHOSCOPY WITH ENDOBRONCHIAL NAVIGATION N/A 09/05/2017   Procedure: VIDEO BRONCHOSCOPY WITH ENDOBRONCHIAL NAVIGATION;  Surgeon: Collene Gobble, MD;  Location: [place];  Service: Thoracic;  Laterality: N/A;   VIDEO BRONCHOSCOPY WITH ENDOBRONCHIAL ULTRASOUND N/A 09/05/2017   Procedure: VIDEO BRONCHOSCOPY WITH ENDOBRONCHIAL ULTRASOUND;  Surgeon: Collene Gobble, MD;  Location: [place];  Service: Thoracic;  Laterality: N/A;   Patient Active Problem List   Diagnosis Date Noted   Encounter for preoperative dental examination 04/17/2022   Teeth missing 04/17/2022   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   Accretions on teeth 04/17/2022   Generalized gingival recession 04/17/2022   Excessive dental attrition 04/17/2022   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   Hematoma of right ankle 10/28/2021   GI bleed 06/18/2020   COVID-19 virus infection 06/18/2020   Abdominal pain 06/18/2020   CKD (chronic kidney disease) stage 3, GFR 30-59 ml/min ([place]) 06/18/2020   COPD (chronic obstructive pulmonary disease) ([place]) 06/18/2020   Hard of hearing 06/18/2020   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   Right renal artery stenosis ([place]) 12/17/2017   Hydropneumothorax 09/28/2017   S/P lobectomy of lung 09/19/2017   Malignant neoplasm of upper lobe of left lung ([place]) 09/13/2017   Pulmonary nodule, left 09/05/2017  Mediastinal lymphadenopathy 09/05/2017   Multiple rib fractures 07/16/2017   Spondylolisthesis, lumbar region 06/18/2017   Right hip pain 05/23/2017   Chronic bilateral low back pain with bilateral sciatica 05/23/2017   Colon cancer screening 01/12/2017   Antiplatelet or antithrombotic long-term use 01/12/2017   PAD (peripheral artery disease) ([place]) 04/13/2011   Murmur, heart 12/08/2010   ACTINIC KERATOSIS, HEAD 07/14/2010   BRUISED ANKLE 07/14/2010   HYPERTENSION, BENIGN 11/19/2009   TOBACCO ABUSE 10/28/2009    EAR PAIN, LEFT 10/28/2009    PCP: [name], MD   REFERRING PROVIDER: Eppie Gibson, MD    REFERRING DIAG: C14.0 (ICD-10-CM) - 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 My left foot is messed up because the case of the drink fell on my foot. I can not lift my foot.   PERTINENT HISTORY:  Presented with ongoing left ear pain and throat pain to Dr. West Carbo on 02/23/22. Laryngoscopy performed  during that visit revealed a mass on the posterior pharyngeal wall noted as slightly friable in appearance. Normal vocal cord mobility was appreciated without vocal cord nodule, mass, polyp or tumor. Hypopharynx also appeared normal without mass, pooling of secretions or aspiration.CT neck/thyroid w cont 03-07-22 showed:  "No evidence of laryngeal mass. Question abnormal appearance of the  upper cervical esophagus, which may show mild thickening with adjacent edema, which could go along with esophagitis. No definite adenopathy. Single slightly prominent level 2 level 3 junction lymph node on the left with short axis dimension of 9-10 mm.  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 The patient followed up with Dr. Redmond Baseman on 03/16/22 to discuss treatment options. Options included surgical resection with reconstruction and radiation treatment, vs primary chemoradiation were discussed with the patient.  Pt chose to complete radiation. 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 Recent chest CT showed no evidence of local recurrence or metastatic disease. " Pt began radiation on Thursday 05/04/22.  PATIENT GOALS:  Reassess how my recovery is going related to neck ROM, cervical pain, fatigue, and swelling.  PAIN:  Are you having pain? No  PRECAUTIONS: Recent radiation, Head and neck lymphedema risk, Other: COPD, back fusions   OBJECTIVE:   POSTURE:  Forward head and rounded shoulders posture   30 SEC SIT TO STAND: 14 reps in 30 sec without use of UEs which is  Excellentfor patient's age. Baseline: 17 reps in 30 sec without use of UEs which is  Excellent for patient's age   SHOULDER AROM:   WFL       CERVICAL AROM:     Percent limited 07/10/22  Flexion [facility] WFL  Extension WFL 25% limited  Right lateral flexion [facility] WFL  Left lateral flexion [facility] WFL  Right rotation WFL 25% limited  Left rotation WFL 25% limited                          (Blank rows=not tested)    LYMPHEDEMA ASSESSMENT:    Circumference in cm  4 cm superior to sternal notch around neck 36.9  6 cm superior to sternal notch around neck 37.1  8 cm superior to sternal notch around neck 37.6  R lateral nostril from base of nose to medial tragus   L lateral nostril from base of nose to medial tragus   R corner of mouth to where ear lobe meets face   L corner of mouth to where ear lobe meets face         (Blank rows=not tested)   CURRENT/PAST TREATMENTS:  Surgery type/date: none  Chemotherapy: none  Radiation:completed  OTHER SYMPTOMS:  Pain No Fibrosis No Pitting edema No Infections No Decreased scar mobility No  PATIENT EDUCATION:  Education details: importance of continuing exercises, continue to monitor for signs of swelling, riding stationary bike to continue to  increase endurance Person educated: Patient and Child(ren) Education method: Explanation Education comprehension: verbalized understanding   HOME EXERCISE PROGRAM:  Reviewed previously given post op HEP.   ASSESSMENT:  CLINICAL IMPRESSION: Pt returns to PT after completing radiation for treatment of cancer of posterior pharynx. He is slightly limited with cervical extension and bilateral cervical rotation. He reports he has been doing the stretches but educated pt today to hold them for longer. Pt reports he feels good and is not having any trouble. There are no signs of swelling. He does report a box of nutrition drinks dropped on his foot and since then he has not been able to dorsiflex his left foot. He also has decreased sensation to the top of his left foot. This happened nearly a month ago. Pt also has a history of back issues. Educated pt that he needs to follow up with a doctor regarding this. Pt has no current needs for physical therapy at this time. Educated pt that if he ever notices any signs of swelling to make his doctor aware.     PT treatment/interventions: ADL/Self care home management,    GOALS Name Target Date (Remove Blue Hyperlink) Goal status  1 Pt will demonstrate a return to baseline cervical ROM measurements and not demonstrate any signs or symptoms of lymphedema. Baseline: 07/10/22  MET  '2     3     4              ' PLAN: PT FREQUENCY/DURATION: d/c this visit   PLAN FOR NEXT SESSION: d/c    [facility]  [address]  [place] [address]  [phone]   Scar massage You can begin gentle scar massage to you incision sites. Gently place one hand on the incision and move the skin (without sliding on the skin) in various directions. Do this for a few minutes and then you can gently massage either coconut oil or vitamin E cream into the scars.  Home exercise Program Continue doing the exercises you were given  until you feel like you can do them without feeling any tightness at the end. It is best to do them for several months after completion of radiation since the effects of radiation continue past completion.   Walking Program Studies show that 30 minutes of walking per day (fast enough to elevate your heart rate) can significantly reduce the risk of a cancer recurrence. If you can't walk due to other medical reasons, we encourage you to find another activity you could do (like a stationary bike or water exercise).  Posture After treatment for head and neck cancer, people frequently sit with rounded shoulders and forward head posture because the front of the neck has become tight and it feels better. If you sit like this, you can become very tight and have pain in sitting or standing with good posture. Try to be aware of your posture and sit and stand up tall to heal properly.  Follow up PT: Please let you doctor know as soon as possible if you develop any swelling in your face or neck in the future. Lymphedema (swelling) can occur months after completion of radiation. The sooner you can let the doctor know, the sooner they can refer you back to PT. It is much easier to treat the swelling early on.   Allyson Sabal [place], PT 07/10/2022, 12:10 PM   PHYSICAL THERAPY DISCHARGE SUMMARY  Visits from Start of Care: 2  Current functional level related to goals / functional outcomes: All goals met   Remaining deficits: None   Education / Equipment: HEP, lymphedema education   Patient agrees to discharge. Patient goals were met. Patient is being discharged due to meeting the stated rehab goals.   Allyson Sabal [place], [place] 07/10/22 12:10 PM

## 2022-07-11 ENCOUNTER — Other Ambulatory Visit: Payer: Self-pay

## 2022-07-11 ENCOUNTER — Ambulatory Visit
Admission: RE | Admit: 2022-07-11 | Discharge: 2022-07-11 | Disposition: A | Discharge status: Home / Self Care | Payer: Medicare Other | Source: Ambulatory Visit | Attending: Radiation Oncology | Admitting: Radiation Oncology

## 2022-07-11 VITALS — BP 116/66 | HR 54 | Temp 97.6°F | Resp 20 | Ht 69.5 in | Wt 152.6 lb

## 2022-07-11 DIAGNOSIS — C140 Malignant neoplasm of pharynx, unspecified: Secondary | ICD-10-CM | POA: Insufficient documentation

## 2022-07-11 NOTE — Progress Notes (Signed)
Mr. Nathaniel Long presents today for follow-up after completing radiation to his pharynx on 06/23/2022  Pain issues, if any: Reports pain has resolved  Using a feeding tube?: N/A Weight changes, if any:  Wt Readings from Last 3 Encounters:  07/11/22 152 lb 9.6 oz (69.2 kg)  06/22/22 159 lb 12.8 oz (72.5 kg)  06/15/22 160 lb 3.2 oz (72.7 kg)   Swallowing issues, if any: Patient denies; per Glendell Docker Shinke-SLP 07/10/2022: "Today pt ate cereal bar, and drank thin liquids with coughing on approx 25% of solid boluses. SLP suggested MBS to referring MD given pt's overt s/sx aspiration and of aspiration PNA, and pt not following aspiration precautions from [facility] 05/24/22. At this time pt swallowing is deemed [facility] *with precautions listed* from his MBSS on 05-24-22. Data indicate that pt's swallow ability will likely decrease even more over the course of radiation therapy and could very well decline over time following the conclusion of that therapy due to muscle disuse atrophy and/or muscle fibrosis. Pt will cont to need to be seen by SLP in order to assess safety of PO intake, assess the need for recommending any objective swallow assessment, and ensuring pt is correctly completing the individualized HEP.  Smoking or chewing tobacco? None Using fluoride trays daily? N/A Last ENT visit was on: Not since diagnosis  Other notable issues, if any: Continues to deal dry mouth and thick phlegm. Reports energy level has improved and he's feeling well. Skin appears intact and well healed. Scheduled to see sports medicine next week to get left foot concerns investigated

## 2022-07-12 ENCOUNTER — Encounter: Payer: Self-pay | Admitting: Radiation Oncology

## 2022-07-12 ENCOUNTER — Other Ambulatory Visit (HOSPITAL_COMMUNITY): Payer: Self-pay

## 2022-07-12 DIAGNOSIS — R1310 Dysphagia, unspecified: Secondary | ICD-10-CM

## 2022-07-12 NOTE — Progress Notes (Signed)
Radiation Oncology         [phone] ________________________________  Name: Nathaniel Long MRN: [id number]  Date: 07/11/2022  DOB: 04/12/1941  Follow-Up Visit Note  CC: [name], MD  [name], MD  Diagnosis and Prior Radiotherapy:       ICD-10-CM   1. 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     2. 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[place]) Staging form: Pharynx - P16 Negative Oropharynx, AJCC 8th Edition - Clinical stage from 04/25/2022: Stage III (cT3, cN1, cM0, p16-) - Signed by Eppie Gibson, MD on 04/25/2022 Stage prefix: Initial diagnosis  Malignant neoplasm of upper lobe of left lung [facility]) Staging form: Lung, AJCC 8th Edition - Clinical stage from 09/13/2017: Stage IA2 (cT1b, cN0, cM0) - Unsigned Laterality: Left Tumor size (mm): 16   Radiation Treatment Dates: 05/04/2022 through 06/23/2022 Site Technique Total Dose (Gy) Dose per Fx (Gy) Completed Fx Beam Energies  Neck: HN_pharynx IMRT 70/70 2 35/35 6X    CHIEF COMPLAINT:  Here for follow-up and surveillance of throat cancer  Narrative:  The patient returns today for routine follow-up.  [name] presents today for follow-up after completing radiation to his pharynx on 06/23/2022  Pain issues, if any: Reports pain has resolved  Using a feeding tube?: N/A Weight changes, if any:  Wt Readings from Last 3 Encounters:  07/11/22 152 lb 9.6 oz (69.2 kg)  06/22/22 159 lb 12.8 oz (72.5 kg)  06/15/22 160 lb 3.2 oz (72.7 kg)   Swallowing issues, if any: Patient denies; per Glendell Docker Shinke-SLP 07/10/2022: "Today pt ate cereal bar, and drank thin liquids with coughing on approx 25% of solid boluses. SLP suggested MBS to referring MD given pt's overt s/sx aspiration and of aspiration PNA, and pt not following aspiration precautions from [facility] 05/24/22. At this time pt swallowing is deemed [facility] *with precautions listed*  from his MBSS on 05-24-22. Data indicate that pt's swallow ability will likely decrease even more over the course of radiation therapy and could very well decline over time following the conclusion of that therapy due to muscle disuse atrophy and/or muscle fibrosis. Pt will cont to need to be seen by SLP in order to assess safety of PO intake, assess the need for recommending any objective swallow assessment, and ensuring pt is correctly completing the individualized HEP.  Smoking or chewing tobacco? None Using fluoride trays daily? N/A Last ENT visit was on: Not since diagnosis  Other notable issues, if any: Continues to deal dry mouth and thick phlegm. Reports energy level has improved and he's feeling well. Skin appears intact and well healed. Scheduled to see sports medicine next week to get left foot concerns investigated   We ordered a chest x-ray to be done before today's appointment based on the recommendations of speech-language pathology.  The patient declined this due to cost concerns.  He denies any progressive shortness of breath or new cough            The patient's daughter-in-law reports that he is declining dentistry follow-up here at our cancer center due to cost concerns as well          ALLERGIES:  is allergic to gabapentin and statins.  Meds: Current Outpatient Medications  Medication Sig Dispense Refill   ALPRAZolam (XANAX) 0.5 MG tablet Take 1 tablet (0.5 mg total) by mouth  at bedtime as needed for anxiety. 90 tablet 1   amLODipine (NORVASC) 5 MG tablet Take 1 tablet (5 mg total) by mouth daily. (Patient taking differently: Take 2.5 mg by mouth daily.) 90 tablet 3   aspirin EC 81 MG tablet Take 1 tablet (81 mg total) by mouth daily. Swallow whole. 90 tablet 3   calcium carbonate (TUMS - DOSED IN MG ELEMENTAL CALCIUM) 500 MG chewable tablet Chew 1 tablet by mouth daily as needed for indigestion or heartburn.     ezetimibe (ZETIA) 10 MG tablet Take 5 mg by mouth daily.  3    fish oil-omega-3 fatty acids 1000 MG capsule Take 1 g by mouth every evening.     ibuprofen (ADVIL) 400 MG tablet Take 1 tablet (400 mg total) by mouth every 4 (four) hours as needed. Take only as needed for throat/neck pain following radiation, as this medication can affect your kidneys. Taper as your pain improves. Take with food. 60 tablet 0   lidocaine (XYLOCAINE) 2 % solution Patient: Mix 1part 2% viscous lidocaine, 1part H20. Swish & swallow 14mL of diluted mixture, 26min before meals and at bedtime, up to QID PRN soreness. 150 mL 4   LIVALO 4 MG TABS TAKE 1/2TABLET DAILY 45 tablet 3   metoprolol succinate (TOPROL-XL) 25 MG 24 hr tablet Take 1 tablet by mouth daily.     Multiple Vitamins-Minerals (CENTRUM PO) Take 1 tablet by mouth every evening.     pantoprazole (PROTONIX) 40 MG tablet TAKE 1 TABLET BY MOUTH ONCE DAILY 1/2 HOUR BEFORE A  MEAL 90 tablet 3   polyethylene glycol (MIRALAX / GLYCOLAX) 17 g packet Take 17 g by mouth daily. 14 each 0   Tiotropium Bromide-Olodaterol (STIOLTO RESPIMAT) 2.5-2.5 MCG/ACT AERS Inhale 2 puffs into the lungs daily. 4 g 0   No current facility-administered medications for this encounter.   Facility-Administered Medications Ordered in Other Encounters  Medication Dose Route Frequency Provider Last Rate Last Admin   0.9 %  sodium chloride infusion  250 mL Intravenous PRN Gold, Wayne E, PA-C       levalbuterol (XOPENEX) nebulizer solution 0.63 mg  0.63 mg Nebulization Q6H PRN Gaye Pollack, MD   0.63 mg at 09/29/17 1646   sodium chloride flush (NS) 0.9 % injection 3 mL  3 mL Intravenous Q12H Gold, Wayne E, PA-C       sodium chloride flush (NS) 0.9 % injection 3 mL  3 mL Intravenous Q12H Gold, Wayne E, PA-C       sodium chloride flush (NS) 0.9 % injection 3 mL  3 mL Intravenous PRN [name], PA-C        Physical Findings: The patient is in no acute distress. Patient is alert and oriented. Wt Readings from Last 3 Encounters:  07/11/22 152 lb 9.6 oz  (69.2 kg)  06/22/22 159 lb 12.8 oz (72.5 kg)  06/15/22 160 lb 3.2 oz (72.7 kg)    height is 5' 9.5" (1.765 m) and weight is 152 lb 9.6 oz (69.2 kg). His temperature is 97.6 F (36.4 C). His blood pressure is 116/66 and his pulse is 54 (abnormal). His respiration is 20 and oxygen saturation is 100%. .  General: Alert and oriented, in no acute distress HEENT: Head is normocephalic. Extraocular movements are intact. Oropharynx is notable for no lesions in upper throat.  He is very hard of hearing;  mucosa is moist.  No thrush Neck: Neck is notable for no palpable adenopathy Skin: Skin  in treatment fields shows satisfactory healing over neck Heart: Regular in rate and rhythm with no murmurs, rubs, or gallops. Chest: No rhonchi, good airflow Extremities: No cyanosis or edema. Lymphatics: see Neck Exam Psychiatric: Judgment and insight are intact. Affect is appropriate.   Lab Findings: Lab Results  Component Value Date   WBC 9.3 04/24/2022   HGB 11.6 (L) 04/24/2022   HCT 33.9 (L) 04/24/2022   MCV 92.6 04/24/2022   PLT 450 (H) 04/24/2022    Lab Results  Component Value Date   TSH 3.030 04/24/2022    Radiographic Findings: No results found.  Impression/Plan:    1) Head and Neck Cancer Status: Healing well from radiation therapy.  2) Nutritional Status: Continue p.o. intake as tolerated.  Weight dropped a little bit but is stabilizing PEG tube: None  3) Risk Factors: The patient has been educated about risk factors including alcohol and tobacco abuse; they understand that avoidance of alcohol and tobacco is important to prevent recurrences as well as other cancers  4) Swallowing: Continue following the instructions of speech-language pathology.  There was a concern about possible aspiration symptoms and chest x-ray was recommended that the patient is declining this due to cost concerns  5) Dental: Encouraged to continue regular followup with dentistry, and dental hygiene  including fluoride rinses.  He declined follow-up with our dentistry at the cancer center due to cost concerns.  I wrote instructions on a sheet of paper for the patient to follow regarding dental care.  He should follow-up with his community dentist every 4 months, ideally, and floss daily.  Brush twice daily with fluoride toothpaste.  I also recommended he use a fluoride mouthwash without alcohol before he goes to bed.  I advised that if any dental extractions are recommended in the future, his dentist needs to contact me so they have access to his radiation history  6) Thyroid function: Check annually Lab Results  Component Value Date   TSH 3.030 04/24/2022    7)  Follow-up in mid December with restaging PET scan; the patient was encouraged to call with any issues or questions before then.  On date of service, in total, I spent 25 minutes on this encounter. Patient was seen in person.  Note was signed a day after encounter, minutes pertaining to date of service only _____________________________________   Eppie Gibson, MD

## 2022-07-13 ENCOUNTER — Ambulatory Visit: Payer: Medicare Other | Admitting: Student

## 2022-07-13 ENCOUNTER — Ambulatory Visit: Payer: Medicare Other

## 2022-07-13 VITALS — BP 106/63 | HR 49 | Temp 97.9°F | Resp 16 | Ht 69.0 in | Wt 154.0 lb

## 2022-07-13 DIAGNOSIS — I739 Peripheral vascular disease, unspecified: Secondary | ICD-10-CM

## 2022-07-13 DIAGNOSIS — E7800 Pure hypercholesterolemia, unspecified: Secondary | ICD-10-CM

## 2022-07-13 DIAGNOSIS — R001 Bradycardia, unspecified: Secondary | ICD-10-CM

## 2022-07-13 DIAGNOSIS — I10 Essential (primary) hypertension: Secondary | ICD-10-CM

## 2022-07-13 MED ORDER — AMLODIPINE BESYLATE 5 MG PO TABS: 2.5000 mg | ORAL_TABLET | Freq: Every day | ORAL | 1 refills | Status: DC

## 2022-07-13 NOTE — Progress Notes (Signed)
Primary Physician/Referring:  [name], MD  Patient ID: Nathaniel Long, male    DOB: 11/10/1940, 81 y.o.   MRN: [id number]  Chief Complaint  Patient presents with   PAD   Bradycardia   Follow-up   HPI:    Nathaniel Long  is a 81 y.o. 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 Angiograms could not explain his right hip pain and right leg pain and was recommended evaluation for pseudo-claudication. Patient with history of upper GI bleed in 06/2020.   Patient presents for 6 month follow up. At last office visit patient was stable from a cardiac standpoint however, blood pressure was soft and EKG revealed new right bundle branch block. Amlodipine was decreased to 5mg  daily.  Patient remains asymptomatic from a cardiovascular standpoint. GXT revealed chronotropic competence. He has lost approximately 25 pounds in the past year while undergoing chemotherapy and radiation. He does have some dizziness especially when bending forward or moving from sitting to standing position too quickly. Denies chest pain, dyspnea, syncope, near syncope, claudication, palpitations.   Past Medical History:  Diagnosis Date   Anxiety    Cancer ([place])    lung   Carotid artery stenosis    Cataract    Chronic lower back pain    CKD (chronic kidney disease)    Complication of anesthesia    "can't pee when I come out of OR" (07/18/2017)   Contact with chainsaw as cause of accidental injury 07/16/2017   sternal fracture with bilateral anterior fifth and sixth rib fractures /notes 07/16/2017   COPD (chronic obstructive pulmonary  disease) (HCC)    Dyspnea    GERD (gastroesophageal reflux disease)    Hard of hearing    High cholesterol    Hypertension    Peripheral vascular disease ([place])    Thoracic ascending aortic aneurysm [facility])    Past Surgical History:  Procedure Laterality Date   ABDOMINAL AORTOGRAM W/LOWER EXTREMITY N/A 05/01/2017   Procedure: Abdominal Aortogram w/Lower Extremity;  Surgeon: Adrian Prows, MD;  Location: [place] [facility];  Service: Cardiovascular;  Laterality: N/A;   BACK SURGERY     FINGER REPLANTATION Left    "ring finger"   HEMORRHOID SURGERY     POSTERIOR LUMBAR FUSION  1986   RENAL ANGIOGRAPHY Bilateral 12/18/2017   Procedure: RENAL ANGIOGRAPHY;  Surgeon: Nigel Mormon, MD;  Location: [place] [facility];  Service: Cardiovascular;  Laterality: Bilateral;   VIDEO ASSISTED THORACOSCOPY (VATS)/ LOBECTOMY Left 09/19/2017   Procedure: LEFT VIDEO ASSISTED THORACOSCOPY (VATS)/ LEFT UPPER LOBECTOMY, LYMPH NODE DISECTION;  Surgeon: Melrose Nakayama, MD;  Location: [place];  Service: Thoracic;  Laterality: Left;   VIDEO BRONCHOSCOPY WITH ENDOBRONCHIAL NAVIGATION N/A 09/05/2017   Procedure: VIDEO BRONCHOSCOPY WITH ENDOBRONCHIAL NAVIGATION;  Surgeon: Collene Gobble, MD;  Location: [facility];  Service: Thoracic;  Laterality: N/A;   VIDEO BRONCHOSCOPY WITH ENDOBRONCHIAL ULTRASOUND N/A 09/05/2017   Procedure: VIDEO BRONCHOSCOPY WITH ENDOBRONCHIAL ULTRASOUND;  Surgeon: Collene Gobble, MD;  Location: [facility];  Service: Thoracic;  Laterality: N/A;   Family History  Problem Relation Age of Onset   Heart disease Mother    Liver  disease Father    Dementia Sister    Colon polyps Brother    Colon cancer Brother     Social History   Tobacco Use   Smoking status: Former    Packs/day: 1.00    Years: 33.00    Total pack years: 33.00    Types: Cigarettes    Quit date: 04/17/2017    Years since quitting: 5.2   Smokeless tobacco: Never   Tobacco comments:    May, 2018  Substance Use Topics    Alcohol use: Not Currently    Comment: last drink 1995   Marital Status: Married   ROS  Review of Systems  Constitutional: Negative for malaise/fatigue and weight gain.  Cardiovascular:  Negative for chest pain, claudication, leg swelling, near-syncope, orthopnea, palpitations, paroxysmal nocturnal dyspnea and syncope.  Respiratory:  Negative for cough and shortness of breath.   Neurological:  Negative for dizziness.    Objective  Blood pressure 106/63, pulse (!) 49, temperature 97.9 F (36.6 C), temperature source Temporal, resp. rate 16, height 5\' 9"  (1.753 m), weight 154 lb (69.9 kg), SpO2 99 %.     07/13/2022    1:32 PM 07/11/2022    2:08 PM 06/22/2022    1:53 PM  Vitals with BMI  Height 5\' 9"  5' 9.5"   Weight 154 lbs 152 lbs 10 oz 159 lbs 13 oz  BMI 75.10 25.85   Systolic 277 824   Diastolic 63 66   Pulse 49 54      Physical Exam Vitals reviewed.  Cardiovascular:     Rate and Rhythm: Regular rhythm. Bradycardia present.     Pulses: Intact distal pulses.          Carotid pulses are 2+ on the right side with bruit and 2+ on the left side with bruit.      Radial pulses are 2+ on the right side and 2+ on the left side.       Dorsalis pedis pulses are 2+ on the right side and 2+ on the left side.     Heart sounds: S1 normal and S2 normal. Murmur heard.     Blowing holosystolic murmur is present with a grade of 2/6 at the apex.     No gallop.  Pulmonary:     Effort: Pulmonary effort is normal. No respiratory distress.     Breath sounds: No rhonchi or rales.  Musculoskeletal:     Right lower leg: No edema.     Left lower leg: No edema.  Neurological:     Mental Status: He is alert.   Physical exam unchanged compared to previous office visit.  Laboratory examination:   Recent Labs    01/06/22 1111 04/24/22 1234  NA 140 139  K 4.1 3.9  CL 109 107  CO2 24 26  GLUCOSE 93 104*  BUN 30* 31*  CREATININE 1.92* 1.52*  CALCIUM 9.2 9.1  GFRNONAA 35* 46*   CrCl  cannot be calculated (Patient's most recent lab result is older than the maximum 21 days allowed.).     Latest Ref Rng & Units 04/24/2022   12:34 PM 01/06/2022   11:11 AM 01/10/2021   11:09 AM  CMP  Glucose 70 - 99 mg/dL 104  93  99   BUN 8 - 23 mg/dL 31  30  22    Creatinine 0.61 - 1.24 mg/dL 1.52  1.92  1.69   Sodium 135 - 145 mmol/L 139  140  140   Potassium 3.5 -  5.1 mmol/L 3.9  4.1  4.4   Chloride 98 - 111 mmol/L 107  109  109   CO2 22 - 32 mmol/L 26  24  21    Calcium 8.9 - 10.3 mg/dL 9.1  9.2  8.8   Total Protein 6.5 - 8.1 g/dL 7.2  7.6  7.4   Total Bilirubin 0.3 - 1.2 mg/dL 0.7  0.7  1.5   Alkaline Phos 38 - 126 U/L 86  89  66   AST 15 - 41 U/L 18  21  36   ALT 0 - 44 U/L 12  15  23        Latest Ref Rng & Units 04/24/2022   12:34 PM 01/06/2022   11:11 AM 01/10/2021   11:09 AM  CBC  WBC 4.0 - 10.5 K/uL 9.3  9.2  8.1   Hemoglobin 13.0 - 17.0 g/dL 11.6  12.1  13.7   Hematocrit 39.0 - 52.0 % 33.9  36.8  41.5   Platelets 150 - 400 K/uL 450  342  318     Lipid Panel No results for input(s): "CHOL", "TRIG", "[place]", "VLDL", "HDL", "CHOLHDL", "LDLDIRECT" in the last 8760 hours.   HEMOGLOBIN A1C Lab Results  Component Value Date   HGBA1C 6.0 02/21/2012   TSH Recent Labs    04/24/22 1235  TSH 3.030   External labs:  10/12/2020: Total cholesterol 117, triglycerides 79, HDL 41, LDL 60 Hemoglobin 13.4, hematocrit 41.0, MCV 94.4, platelets 317 Sodium 138, potassium 4.5, glucose 102, BUN 22, creatinine 1.51, GFR 43 A1c 6.7%  Allergies   Allergies  Allergen Reactions   Gabapentin Other (See Comments)    dizziness   Statins Other (See Comments)    Muscle weakness    Medications Prior to Visit:   Outpatient Medications Prior to Visit  Medication Sig Dispense Refill   ALPRAZolam (XANAX) 0.5 MG tablet Take 1 tablet (0.5 mg total) by mouth at bedtime as needed for anxiety. 90 tablet 1   aspirin EC 81 MG tablet Take 1 tablet (81 mg total) by mouth daily. Swallow whole.  90 tablet 3   calcium carbonate (TUMS - DOSED IN MG ELEMENTAL CALCIUM) 500 MG chewable tablet Chew 1 tablet by mouth daily as needed for indigestion or heartburn.     ezetimibe (ZETIA) 10 MG tablet Take 5 mg by mouth daily.  3   ibuprofen (ADVIL) 400 MG tablet Take 1 tablet (400 mg total) by mouth every 4 (four) hours as needed. Take only as needed for throat/neck pain following radiation, as this medication can affect your kidneys. Taper as your pain improves. Take with food. 60 tablet 0   lidocaine (XYLOCAINE) 2 % solution Patient: Mix 1part 2% viscous lidocaine, 1part H20. Swish & swallow 64mL of diluted mixture, 40min before meals and at bedtime, up to QID PRN soreness. 150 mL 4   LIVALO 4 MG TABS TAKE 1/2TABLET DAILY 45 tablet 3   Multiple Vitamins-Minerals (CENTRUM PO) Take 1 tablet by mouth every evening.     pantoprazole (PROTONIX) 40 MG tablet TAKE 1 TABLET BY MOUTH ONCE DAILY 1/2 HOUR BEFORE A  MEAL 90 tablet 3   polyethylene glycol (MIRALAX / GLYCOLAX) 17 g packet Take 17 g by mouth daily. 14 each 0   Tiotropium Bromide-Olodaterol (STIOLTO RESPIMAT) 2.5-2.5 MCG/ACT AERS Inhale 2 puffs into the lungs daily. 4 g 0   amLODipine (NORVASC) 5 MG tablet Take 1 tablet (5 mg total) by mouth daily. (Patient taking differently: Take 2.5 mg  by mouth daily.) 90 tablet 3   metoprolol succinate (TOPROL-XL) 25 MG 24 hr tablet Take 1 tablet by mouth daily.     fish oil-omega-3 fatty acids 1000 MG capsule Take 1 g by mouth every evening.     Facility-Administered Medications Prior to Visit  Medication Dose Route Frequency Provider Last Rate Last Admin   0.9 %  sodium chloride infusion  250 mL Intravenous PRN Gold, Wayne E, PA-C       levalbuterol (XOPENEX) nebulizer solution 0.63 mg  0.63 mg Nebulization Q6H PRN Gaye Pollack, MD   0.63 mg at 09/29/17 1646   sodium chloride flush (NS) 0.9 % injection 3 mL  3 mL Intravenous Q12H Gold, Wayne E, PA-C       sodium chloride flush (NS) 0.9 % injection 3 mL   3 mL Intravenous Q12H Gold, Wayne E, PA-C       sodium chloride flush (NS) 0.9 % injection 3 mL  3 mL Intravenous PRN Jadene Pierini E, PA-C       Final Medications at End of Visit    Current Meds  Medication Sig   ALPRAZolam (XANAX) 0.5 MG tablet Take 1 tablet (0.5 mg total) by mouth at bedtime as needed for anxiety.   aspirin EC 81 MG tablet Take 1 tablet (81 mg total) by mouth daily. Swallow whole.   calcium carbonate (TUMS - DOSED IN MG ELEMENTAL CALCIUM) 500 MG chewable tablet Chew 1 tablet by mouth daily as needed for indigestion or heartburn.   ezetimibe (ZETIA) 10 MG tablet Take 5 mg by mouth daily.   ibuprofen (ADVIL) 400 MG tablet Take 1 tablet (400 mg total) by mouth every 4 (four) hours as needed. Take only as needed for throat/neck pain following radiation, as this medication can affect your kidneys. Taper as your pain improves. Take with food.   lidocaine (XYLOCAINE) 2 % solution Patient: Mix 1part 2% viscous lidocaine, 1part H20. Swish & swallow 93mL of diluted mixture, 2min before meals and at bedtime, up to QID PRN soreness.   LIVALO 4 MG TABS TAKE 1/2TABLET DAILY   Multiple Vitamins-Minerals (CENTRUM PO) Take 1 tablet by mouth every evening.   pantoprazole (PROTONIX) 40 MG tablet TAKE 1 TABLET BY MOUTH ONCE DAILY 1/2 HOUR BEFORE A  MEAL   polyethylene glycol (MIRALAX / GLYCOLAX) 17 g packet Take 17 g by mouth daily.   Tiotropium Bromide-Olodaterol (STIOLTO RESPIMAT) 2.5-2.5 MCG/ACT AERS Inhale 2 puffs into the lungs daily.   [DISCONTINUED] amLODipine (NORVASC) 5 MG tablet Take 1 tablet (5 mg total) by mouth daily. (Patient taking differently: Take 2.5 mg by mouth daily.)   [DISCONTINUED] metoprolol succinate (TOPROL-XL) 25 MG 24 hr tablet Take 1 tablet by mouth daily.   Radiology:   No results found.  Cardiac Studies:  Exercise treadmill stress test 07/07/2021: Exercise treadmill stress test performed using Bruce protocol.  Patient reached 1.5 METS, and 57% of age predicted  maximum heart rate.  Exercise capacity was very low.  No chest pain reported.  Normal heart rate and hemodynamic response for the level of activity performed.  Rest and stress EKG at 57% MPHR showed showed sinus rhythm, occasional PVC. Inconclusive study with very limited and submaximal effort.   Carotid artery duplex 04/22/2021:  Right Carotid: Velocities in the right ICA are consistent with a 40-59% stenosis. The ECA appears >50% stenosed.  Left Carotid: Velocities in the left ICA are consistent with a 40-59% stenosis. Hemodynamically significant plaque >50% visualized in the  CCA. The ECA appears >50% stenosed.  Vertebrals:  Right vertebral artery demonstrates antegrade flow. Left vertebral artery demonstrates bidirectional flow.  Subclavians: Bilateral subclavian arteries were stenotic.   Carotid artery duplex 08/06/2020:  Stenosis in the right internal carotid artery (16-49%). Stenosis in the right common carotid artery (<50%). Stenosis in the right external carotid artery (<50%).  Stenosis in the left internal carotid artery (50-69%). Stenosis in the left common carotid artery (>50%). Stenosis in the left external carotid artery (<50%).  Antegrade right vertebral artery flow. Antegrade left vertebral artery flow.  Compared to 09/11/2017, there is progression of disease bilaterally, especially in common carotid arteries with homogenous plaque with high risk for embolic complications. Follow up in 6 months is appropriate if clinically indicated.   PCV ECHOCARDIOGRAM COMPLETE 37/16/9678 Normal LV systolic function with visual EF 55-60%. Left ventricle cavity is normal in size. Moderate concentric hypertrophy of the left ventricle. Normal global wall motion. Left atrial cavity is moderately dilated at 4.6 cm. Trileaflet aortic valve. Mild aortic stenosis. Trace aortic regurgitation. Moderate aortic valve leaflet calcification. Mildly restricted aortic valve leaflets. Peak PG of 19.2 and mean PG  of 10.3 mm Hg and Calculated aortic valve area 1.7 cm. Normal mitral valve thickness.  Mild prolapse of the mitral valve leaflets. Moderate posteriorly directed  (Grade III) mitral regurgitation. Mild tricuspid regurgitation. No evidence of pulmonary hypertension. RVSP measures 30 mmHg. Compared to 02/24/2016, MVP and MR new.   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 Previously noted 90% stenosis by angiogram on 05/01/2017 not noted.   Nuclear stress test   08/31/2017: 1. Pharmacologic stress testing was performed with intravenous administration of .4 mg of Lexiscan. 2. Stress EKG is non diagnostic for ischemia as it is a pharmacologic stress. 3. Gated SPECT images reveal normal myocardial thickening and wall motion. The left ventricular ejection fraction was calculated to be 60%. SPECT images demonstrate small perfusion abnormality of mild intensity in the basal inferior and mid inferior myocardial wall(s) on the stress images with mild reversibility on rest images. This could represent diaphragmatic attenuation. 4. This is a low risk study.   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 Right PT and left AT occluded below knee, but otherwise mild disease.  EKG:   EKG 07/13/2022: Sinus bradycardia at rate of 49 bpm.  Left axis deviation.  LVH with secondary ST-T changes.  IVCD. Compared to previous EKG on 01/12/22, RBBB is no longer present.  Assessment     ICD-10-CM   1. Sinus bradycardia  R00.1 EKG 12-Lead    2. PAD (peripheral artery disease) (HCC)  I73.9     3. Primary hypertension  I10     4. Hyperlipidemia, group A  E78.00        Medications Discontinued During This Encounter  Medication Reason   fish oil-omega-3 fatty acids 1000 MG capsule    metoprolol succinate (TOPROL-XL) 25 MG 24 hr tablet Completed Course   amLODipine (NORVASC) 5 MG tablet      Meds ordered this encounter  Medications   amLODipine (NORVASC) 5 MG tablet    Sig: Take 0.5 tablets (2.5 mg total) by mouth daily.    Dispense:  30 tablet    Refill:  1    Order Specific Question:   Supervising Provider    Answer:   Adrian Prows [[id number]]    Recommendations:   Sinus bradycardia He is not sure if he  is taking Metoprolol. Advised to stop Metoprolol given bradycardia if he is still taking this medication. EKG today did not reveal RBBB, no evidence of ischemia.   Primary hypertension Blood pressure remains soft. He does check blood pressure at home and readings are in the 110-120s/60s but SBP does sometime drop into the low 100s.  He is unsure what dose of amlodipine he is taking, he does cut a pill in half cannot recall if its 10mg  or 5mg .  I have asked him to review his medications with family member that was present at todays visit and stop amlodipine if he is taking 2.5mg . If he is taking 5mg , he will reduce this dose to 2.5mg . Encouraged him to continue to monitor blood pressure at home and keep a log of readings. Also, asked that he bring all medications to next visit.   Hyperlipidemia, group A Labs reviewed, lipids are well controlled. He continues on Livalo 4mg  daily. He is not currently exercising due to pain in foot for which he is seeing orthopedics next week.   Overall, he is doing well without any complaints.  Follow-up in 1 month to monitor heart rate and blood pressure, sooner if needed.   Ernst Spell, AGNP-C 07/13/2022, 2:19 PM Office: [phone]

## 2022-07-18 ENCOUNTER — Ambulatory Visit: Payer: Medicare Other | Admitting: Family Medicine

## 2022-07-18 ENCOUNTER — Ambulatory Visit (INDEPENDENT_AMBULATORY_CARE_PROVIDER_SITE_OTHER): Payer: Medicare Other

## 2022-07-18 ENCOUNTER — Ambulatory Visit: Payer: Medicare Other

## 2022-07-18 VITALS — BP 110/72 | HR 52 | Ht 69.0 in | Wt 155.4 lb

## 2022-07-18 DIAGNOSIS — M21372 Foot drop, left foot: Secondary | ICD-10-CM

## 2022-07-18 DIAGNOSIS — M79672 Pain in left foot: Secondary | ICD-10-CM | POA: Diagnosis not present

## 2022-07-18 MED ORDER — AMBULATORY NON FORMULARY MEDICATION: 0 refills | Status: DC

## 2022-07-18 MED ORDER — PREDNISONE 50 MG PO TABS: 50.0000 mg | ORAL_TABLET | Freq: Every day | ORAL | 0 refills | Status: DC

## 2022-07-18 NOTE — Patient Instructions (Addendum)
Thank you for coming in today.   Please get an Xray of your foot and low back today before you leave   You should hear from MRI scheduling within 1 week. If you do not hear please let me know.    We will send the prescription to the [facility] for the foot brace.  Check back after MRI

## 2022-07-18 NOTE — Progress Notes (Signed)
I, Peterson Lombard, LAT, ATC acting as a scribe for [name], MD.  Nathaniel Long is an 81 y.o. male who presents to [place] [facility] today for left foot pain.  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 Pt was previously seen by Dr. Tamala Julian on 11/30/2021 for a hematoma of the right ankle.  Today, patient complains of left foot pain 3-4 weeks. Pt dropped a box of Ensure from his dietitian, on his L foot. Pt is unable to dorsiflex his foot and ankle.  His L foot/ankle and has numbness on the dorsum of his L foot. Pt notes a hx of LBP. Pt is here today w/ his daughter-in-law and notes gait disturbances. Patient locates pain to the dorsum of the foot and into the lateral ankle.  He notes he is having trouble with gait.  He is dragging his foot and toe and catching his foot on objects occasionally.  He did have a fall last week that he attributes to tripping over a flower pot.  He has not been injured otherwise.  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  He has a PET scan scheduled for December to assess response to treatment.  L foot swelling: no Aggravates: walking Treatments tried: elevation  Dx imaging: 06/15/17 L-spine MRI  Pertinent review of systems: No fevers or chills.  Relevant historical information: Recently completed radiation therapy for PET scan.  History of lumbar compression   Exam:  BP 110/72   Pulse (!) 52   Ht 5\' 9"  (1.753 m)   Wt 155 lb 6.4 oz (70.5 kg)   SpO2 97%   BMI 22.95 kg/m  General: Well Developed, well nourished, and in no acute distress.   MSK: L-spine nontender midline decreased lumbar motion. Left foot and ankle normal-appearing Foot is nontender to palpation. Decreased foot active motion. Significantly weak to of dorsiflexion rated 3-/5 Decreased sensation to light touch dorsal foot. Nontender to palpation and unable to reproduce  or worsen symptoms with pressure over proximal lateral knee over common fibular nerve.  Of note patient is very hard of hearing.   Lab and Radiology Results  X-ray images L-spine and left foot obtained today personally and independently interpreted  L-spine: Diffuse degenerative changes.  Possible compression deformity at L3.  No aggressive appearing bony lesions are present.  Aortic atherosclerosis is present.  Left foot: Mild degenerative changes in midfoot and first MTP.  No acute fractures are present.  No aggressive appearing bony lesions are present.  Await formal radiology review    Assessment and Plan: 81 y.o. male with left foot drop.  This is a new problem that has occurred recently.  He does have a history of nerve compression at L5 on prior MRI in 2018.  That would correspond to his symptoms today of foot drop and numbness to the dorsal foot.  Plan for x-ray and lumbar spine MRI.  Will prescribe short course of prednisone and an ankle-foot orthosis.  Anticipate likely direct epidural steroid injection following the MRI.  We will send a prescription for the ankle-foot orthosis to the [facility].   PDMP not reviewed this encounter. Orders Placed This Encounter  Procedures   DG Foot Complete Left    Standing Status:   Future    Number of Occurrences:   1    Standing Expiration Date:   07/19/2023    Order Specific  Question:   Reason for Exam (SYMPTOM  OR DIAGNOSIS REQUIRED)    Answer:   left foot pain    Order Specific Question:   Preferred imaging location?    Answer:   [facility]   DG Lumbar Spine 2-3 Views    Standing Status:   Future    Number of Occurrences:   1    Standing Expiration Date:   08/18/2022    Order Specific Question:   Reason for Exam (SYMPTOM  OR DIAGNOSIS REQUIRED)    Answer:   radicular pain    Order Specific Question:   Preferred imaging location?    Answer:   [facility]   MR Lumbar Spine Wo Contrast    Standing Status:    Future    Standing Expiration Date:   07/19/2023    Order Specific Question:   What is the patient's sedation requirement?    Answer:   No Sedation    Order Specific Question:   Does the patient have a pacemaker or implanted devices?    Answer:   No    Order Specific Question:   Preferred imaging location?    Answer:   Product/process development scientist (table limit-350lbs)   Meds ordered this encounter  Medications   predniSONE (DELTASONE) 50 MG tablet    Sig: Take 1 tablet (50 mg total) by mouth daily.    Dispense:  5 tablet    Refill:  0   AMBULATORY NON FORMULARY MEDICATION    Sig: L AFO for foot drop Dispense 1 Dx code: G25.427 Use as needed    Dispense:  1 Units    Refill:  0     Discussed warning signs or symptoms. Please see discharge instructions. Patient expresses understanding.   The above documentation has been reviewed and is accurate and complete [name], M.D.

## 2022-07-20 NOTE — Progress Notes (Signed)
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  We will see this more accurately on the MRI scheduled on the 23rd.  I do not think this is causing the weakness in the foot but again were going to see this much more accurately on the upcoming MRI.  Chronic compression fracture that occurred well in the past would not cause pain now.  An acute one that occurred more recently would cause significant back pain.

## 2022-07-20 NOTE — Progress Notes (Signed)
Left foot x-ray shows medium arthritis of the big toe joint.

## 2022-07-22 ENCOUNTER — Other Ambulatory Visit: Payer: Medicare Other

## 2022-07-24 ENCOUNTER — Ambulatory Visit (INDEPENDENT_AMBULATORY_CARE_PROVIDER_SITE_OTHER): Payer: Medicare Other

## 2022-07-24 DIAGNOSIS — M21372 Foot drop, left foot: Secondary | ICD-10-CM

## 2022-07-24 DIAGNOSIS — G959 Disease of spinal cord, unspecified: Secondary | ICD-10-CM | POA: Diagnosis not present

## 2022-07-24 DIAGNOSIS — M79672 Pain in left foot: Secondary | ICD-10-CM | POA: Diagnosis not present

## 2022-07-25 ENCOUNTER — Ambulatory Visit (HOSPITAL_COMMUNITY)
Admission: RE | Admit: 2022-07-25 | Discharge: 2022-07-25 | Disposition: A | Discharge status: Home / Self Care | Payer: Medicare Other | Source: Ambulatory Visit | Attending: Internal Medicine | Admitting: Internal Medicine

## 2022-07-25 ENCOUNTER — Encounter: Payer: Self-pay | Admitting: Dietician

## 2022-07-25 DIAGNOSIS — R1310 Dysphagia, unspecified: Secondary | ICD-10-CM | POA: Diagnosis present

## 2022-07-25 DIAGNOSIS — Z87891 Personal history of nicotine dependence: Secondary | ICD-10-CM | POA: Insufficient documentation

## 2022-07-25 DIAGNOSIS — Z923 Personal history of irradiation: Secondary | ICD-10-CM | POA: Insufficient documentation

## 2022-07-25 DIAGNOSIS — I129 Hypertensive chronic kidney disease with stage 1 through stage 4 chronic kidney disease, or unspecified chronic kidney disease: Secondary | ICD-10-CM | POA: Insufficient documentation

## 2022-07-25 DIAGNOSIS — I739 Peripheral vascular disease, unspecified: Secondary | ICD-10-CM | POA: Insufficient documentation

## 2022-07-25 DIAGNOSIS — C3490 Malignant neoplasm of unspecified part of unspecified bronchus or lung: Secondary | ICD-10-CM | POA: Diagnosis not present

## 2022-07-25 DIAGNOSIS — C140 Malignant neoplasm of pharynx, unspecified: Secondary | ICD-10-CM

## 2022-07-25 NOTE — Therapy (Signed)
Modified Barium Swallow Progress Note  Patient Details  Name: PAULA ZIETZ MRN: [id number] Date of Birth: May 20, 1941  Today's Date: 07/25/2022  Modified Barium Swallow completed.  Full report located under Chart Review in the Imaging Section.  Brief recommendations include the following:  Clinical Impression  Patient presents with an improved pharyngeal and cervical esophageal swallow as compared to MBS completed pre-radiation treatment on 05/24/2022. He continues to exhibit pharyngeal residuals in vallecular and pyriform sinuses with liquids and solids (increased in amount with puree and regular solids), but residuals clear with subsequent swallows. His primary dysphagia occurs in the cervical esophagus, with barium residuals remaining above UES as well as in cervical esophagus. Subsequent swallows as well as swallows of liquids did aid in transiting barium through UES and cervical esophagus. He exhibited the greatest amount of cervical esophageal retention with regular solids with moderate amount remaining s/p initial and second swallow. Although mild amount of retrograde movement of barium observed at level of cervical esophagus, once boluses had transited through cervical esophagus, no further retrograde moovement observed. No penetration or aspiration occured at any phase of the swallow. SLP recommended patient continue on current diet but to swallow extra times 2-3 with each bite of solids and to alternate sips and bites to help keep boluses transiting through pharynx and cervical esophagus.   Swallow Evaluation Recommendations       SLP Diet Recommendations: Regular solids;Dysphagia 3 (Mech soft) solids;Thin liquid   Liquid Administration via: Cup;Straw   Medication Administration: Whole meds with puree       Compensations: Follow solids with liquid;Multiple dry swallows after each bite/sip   Postural Changes: Seated upright at 90 degrees          Sonia Baller, MA,  CCC-SLP Speech Therapy

## 2022-07-25 NOTE — Progress Notes (Signed)
Patient provided one complimentary case Ensure Plus HP

## 2022-07-27 ENCOUNTER — Telehealth: Payer: Self-pay | Admitting: Family Medicine

## 2022-07-27 DIAGNOSIS — M21372 Foot drop, left foot: Secondary | ICD-10-CM

## 2022-07-27 NOTE — Progress Notes (Signed)
MRI shows some pinched nerves in your low back.  This could potentially cause your symptoms.  We will do a back injection like we talked about.  I have already placed the order at the radiology office to schedule the back injection.  Phone number is [phone].  Recommend rechecking with me a few days after the back injection.  We will go over the MRI results and see how you are feeling after the injection.  (Of not patient is hard of hearing)

## 2022-07-27 NOTE — Telephone Encounter (Signed)
ESI ordered. 

## 2022-07-28 ENCOUNTER — Ambulatory Visit: Payer: Self-pay | Admitting: Radiation Oncology

## 2022-07-28 NOTE — Progress Notes (Signed)
Sent a letter to patient while we wait to see if person taking care of patient will see results in mychart.

## 2022-07-29 ENCOUNTER — Other Ambulatory Visit (HOSPITAL_COMMUNITY): Payer: Self-pay

## 2022-07-31 ENCOUNTER — Ambulatory Visit: Payer: Medicare Other

## 2022-07-31 NOTE — Progress Notes (Signed)
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    Called and spoke with daughter in law [place].  Sharyn Lull reports that patient is eating better and feeling better.  Less pain on swallowing.  Has been able to eat potato chips recently along with a tender T-bone steak.  Eating most meats, soft vegetables and spaghetti.  Drinking oral nutrition supplements.    SLP note reviewed and recommended dysphagia 3 with thin liquids  Anthropometrics:   10/17 155 lb  9/21 159 lb 7/14 169 lb  Dtr in law thinks patient has been able to maintain weight or gain few pounds recently   NUTRITION DIAGNOSIS: Food and nutrition related knowledge deficit improved   INTERVENTION:  Continue to encouraged high calorie, high protein foods to promote weight gain Continue oral nutrition supplements Contact information given to daughter in law and she will reach out if needed in the future     NEXT VISIT: no follow-up RD available as needed  Kaj Vasil B. Zenia Resides, Eugenio Saenz, [place] Registered Dietitian [phone]

## 2022-08-08 ENCOUNTER — Ambulatory Visit
Admission: RE | Admit: 2022-08-08 | Discharge: 2022-08-08 | Disposition: A | Discharge status: Home / Self Care | Payer: Medicare Other | Source: Ambulatory Visit | Attending: Family Medicine | Admitting: Family Medicine

## 2022-08-08 DIAGNOSIS — M21372 Foot drop, left foot: Secondary | ICD-10-CM

## 2022-08-08 MED ORDER — METHYLPREDNISOLONE ACETATE 40 MG/ML INJ SUSP (RADIOLOG
80.0000 mg | Freq: Once | INTRAMUSCULAR | Status: AC
  Administered 2022-08-08: 80 mg via EPIDURAL

## 2022-08-08 MED ORDER — IOPAMIDOL (ISOVUE-M 200) INJECTION 41%
1.0000 mL | Freq: Once | INTRAMUSCULAR | Status: AC
  Administered 2022-08-08: 1 mL via EPIDURAL

## 2022-08-08 NOTE — Discharge Instructions (Signed)

## 2022-08-09 ENCOUNTER — Other Ambulatory Visit: Payer: Self-pay

## 2022-08-09 MED ORDER — PANTOPRAZOLE SODIUM 40 MG PO TBEC: DELAYED_RELEASE_TABLET | ORAL | 3 refills | Status: DC

## 2022-08-10 ENCOUNTER — Ambulatory Visit: Payer: Medicare Other | Attending: Radiation Oncology

## 2022-08-10 ENCOUNTER — Ambulatory Visit: Payer: Medicare Other

## 2022-08-10 VITALS — BP 142/72 | HR 50 | Ht 69.0 in | Wt 159.8 lb

## 2022-08-10 DIAGNOSIS — I6523 Occlusion and stenosis of bilateral carotid arteries: Secondary | ICD-10-CM

## 2022-08-10 DIAGNOSIS — I10 Essential (primary) hypertension: Secondary | ICD-10-CM

## 2022-08-10 DIAGNOSIS — R1313 Dysphagia, pharyngeal phase: Secondary | ICD-10-CM | POA: Diagnosis present

## 2022-08-10 DIAGNOSIS — E7800 Pure hypercholesterolemia, unspecified: Secondary | ICD-10-CM

## 2022-08-10 NOTE — Progress Notes (Signed)
Primary Physician/Referring:  [name], MD  Patient ID: Nathaniel Long, male    DOB: April 28, 1941, 81 y.o.   MRN: [id number]  Chief Complaint  Patient presents with   Follow-up   Bradycardia   HPI:    Nathaniel Long  is a 81 y.o. 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 Angiograms could not explain his right hip pain and right leg pain and was recommended evaluation for pseudo-claudication. Patient with history of upper GI bleed in 06/2020.   Patient presents for 6 month follow up. At last office visit patient was stable from a cardiac standpoint however, blood pressure was soft and EKG revealed new right bundle branch block. Amlodipine was stopped.  He brings a log of home blood pressure readings that are well controlled. He remains asymptomatic from a cardiovascular standpoint. Previous GXT revealed chronotropic competence. He has lost approximately 25 pounds in the past year while undergoing chemotherapy and radiation.  Denies chest pain, dyspnea, syncope, near syncope, claudication, palpitations.   Past Medical History:  Diagnosis Date   Anxiety    Cancer ([place])    lung   Carotid artery stenosis    Cataract    Chronic lower back pain    CKD (chronic kidney disease)    Complication of anesthesia    "can't pee when I come out of OR" (07/18/2017)   Contact with chainsaw as cause of accidental injury 07/16/2017   sternal fracture with bilateral anterior fifth and sixth rib fractures /notes 07/16/2017   COPD (chronic obstructive pulmonary disease) ([facility])    Dyspnea    GERD (gastroesophageal reflux  disease)    Hard of hearing    High cholesterol    Hypertension    Peripheral vascular disease ([place])    Thoracic ascending aortic aneurysm [facility])    Past Surgical History:  Procedure Laterality Date   ABDOMINAL AORTOGRAM W/LOWER EXTREMITY N/A 05/01/2017   Procedure: Abdominal Aortogram w/Lower Extremity;  Surgeon: Adrian Prows, MD;  Location: [facility];  Service: Cardiovascular;  Laterality: N/A;   BACK SURGERY     FINGER REPLANTATION Left    "ring finger"   HEMORRHOID SURGERY     POSTERIOR LUMBAR FUSION  1986   RENAL ANGIOGRAPHY Bilateral 12/18/2017   Procedure: RENAL ANGIOGRAPHY;  Surgeon: Nigel Mormon, MD;  Location: [facility];  Service: Cardiovascular;  Laterality: Bilateral;   VIDEO ASSISTED THORACOSCOPY (VATS)/ LOBECTOMY Left 09/19/2017   Procedure: LEFT VIDEO ASSISTED THORACOSCOPY (VATS)/ LEFT UPPER LOBECTOMY, LYMPH NODE DISECTION;  Surgeon: Melrose Nakayama, MD;  Location: [facility];  Service: Thoracic;  Laterality: Left;   VIDEO BRONCHOSCOPY WITH ENDOBRONCHIAL NAVIGATION N/A 09/05/2017   Procedure: VIDEO BRONCHOSCOPY WITH ENDOBRONCHIAL NAVIGATION;  Surgeon: Collene Gobble, MD;  Location: [facility];  Service: Thoracic;  Laterality: N/A;   VIDEO BRONCHOSCOPY WITH ENDOBRONCHIAL ULTRASOUND N/A 09/05/2017   Procedure: VIDEO BRONCHOSCOPY WITH ENDOBRONCHIAL ULTRASOUND;  Surgeon: Collene Gobble, MD;  Location: [facility];  Service: Thoracic;  Laterality: N/A;   Family History  Problem Relation Age of Onset   Heart disease Mother    Liver disease Father    Dementia Sister  Colon polyps Brother    Colon cancer Brother     Social History   Tobacco Use   Smoking status: Former    Packs/day: 1.00    Years: 33.00    Total pack years: 33.00    Types: Cigarettes    Quit date: 04/17/2017    Years since quitting: 5.3   Smokeless tobacco: Never   Tobacco comments:    May, 2018  Substance Use Topics   Alcohol use: Not Currently    Comment: last drink 1995    Marital Status: Married   ROS  Review of Systems  Constitutional: Negative for malaise/fatigue and weight gain.  Cardiovascular:  Negative for chest pain, claudication, leg swelling, near-syncope, orthopnea, palpitations, paroxysmal nocturnal dyspnea and syncope.  Respiratory:  Negative for cough and shortness of breath.   Neurological:  Negative for dizziness.    Objective  Blood pressure (!) 142/72, pulse (!) 50, height 5\' 9"  (1.753 m), weight 159 lb 12.8 oz (72.5 kg), SpO2 97 %.     08/10/2022    2:35 PM 08/08/2022   12:27 PM 07/18/2022   12:51 PM  Vitals with BMI  Height 5\' 9"   5\' 9"   Weight 159 lbs 13 oz  155 lbs 6 oz  BMI 62.37  62.83  Systolic 151 761 607  Diastolic 72 69 72  Pulse 50 53 52     Physical Exam Vitals reviewed.  Cardiovascular:     Rate and Rhythm: Regular rhythm. Bradycardia present.     Pulses: Intact distal pulses.          Carotid pulses are 2+ on the right side with bruit and 2+ on the left side with bruit.      Radial pulses are 2+ on the right side and 2+ on the left side.       Dorsalis pedis pulses are 2+ on the right side and 2+ on the left side.     Heart sounds: S1 normal and S2 normal. Murmur heard.     Blowing holosystolic murmur is present with a grade of 2/6 at the apex.     No gallop.  Pulmonary:     Effort: Pulmonary effort is normal. No respiratory distress.     Breath sounds: No rhonchi or rales.  Musculoskeletal:     Right lower leg: No edema.     Left lower leg: No edema.  Neurological:     Mental Status: He is alert.     Laboratory examination:   Recent Labs    01/06/22 1111 04/24/22 1234  NA 140 139  K 4.1 3.9  CL 109 107  CO2 24 26  GLUCOSE 93 104*  BUN 30* 31*  CREATININE 1.92* 1.52*  CALCIUM 9.2 9.1  GFRNONAA 35* 46*   CrCl cannot be calculated (Patient's most recent lab result is older than the maximum 21 days allowed.).     Latest Ref Rng & Units 04/24/2022   12:34 PM 01/06/2022   11:11 AM 01/10/2021    11:09 AM  CMP  Glucose 70 - 99 mg/dL 104  93  99   BUN 8 - 23 mg/dL 31  30  22    Creatinine 0.61 - 1.24 mg/dL 1.52  1.92  1.69   Sodium 135 - 145 mmol/L 139  140  140   Potassium 3.5 - 5.1 mmol/L 3.9  4.1  4.4   Chloride 98 - 111 mmol/L 107  109  109   CO2 22 - 32 mmol/L 26  24  21   Calcium 8.9 - 10.3 mg/dL 9.1  9.2  8.8   Total Protein 6.5 - 8.1 g/dL 7.2  7.6  7.4   Total Bilirubin 0.3 - 1.2 mg/dL 0.7  0.7  1.5   Alkaline Phos 38 - 126 U/L 86  89  66   AST 15 - 41 U/L 18  21  36   ALT 0 - 44 U/L 12  15  23        Latest Ref Rng & Units 04/24/2022   12:34 PM 01/06/2022   11:11 AM 01/10/2021   11:09 AM  CBC  WBC 4.0 - 10.5 K/uL 9.3  9.2  8.1   Hemoglobin 13.0 - 17.0 g/dL 11.6  12.1  13.7   Hematocrit 39.0 - 52.0 % 33.9  36.8  41.5   Platelets 150 - 400 K/uL 450  342  318     Lipid Panel No results for input(s): "CHOL", "TRIG", "[place]", "VLDL", "HDL", "CHOLHDL", "LDLDIRECT" in the last 8760 hours.   HEMOGLOBIN A1C Lab Results  Component Value Date   HGBA1C 6.0 02/21/2012   TSH Recent Labs    04/24/22 1235  TSH 3.030   External labs:  10/12/2020: Total cholesterol 117, triglycerides 79, HDL 41, LDL 60 Hemoglobin 13.4, hematocrit 41.0, MCV 94.4, platelets 317 Sodium 138, potassium 4.5, glucose 102, BUN 22, creatinine 1.51, GFR 43 A1c 6.7%  Allergies   Allergies  Allergen Reactions   Gabapentin Other (See Comments)    dizziness   Statins Other (See Comments)    Muscle weakness    Medications Prior to Visit:   Outpatient Medications Prior to Visit  Medication Sig Dispense Refill   ALPRAZolam (XANAX) 0.5 MG tablet Take 1 tablet (0.5 mg total) by mouth at bedtime as needed for anxiety. 90 tablet 1   AMBULATORY NON FORMULARY MEDICATION L AFO for foot drop Dispense 1 Dx code: M21.372 Use as needed 1 Units 0   aspirin EC 81 MG tablet Take 1 tablet (81 mg total) by mouth daily. Swallow whole. 90 tablet 3   calcium carbonate (TUMS - DOSED IN MG ELEMENTAL CALCIUM)  500 MG chewable tablet Chew 1 tablet by mouth daily as needed for indigestion or heartburn.     ezetimibe (ZETIA) 10 MG tablet Take 5 mg by mouth daily.  3   ibuprofen (ADVIL) 400 MG tablet Take 1 tablet (400 mg total) by mouth every 4 (four) hours as needed. Take only as needed for throat/neck pain following radiation, as this medication can affect your kidneys. Taper as your pain improves. Take with food. 60 tablet 0   lidocaine (XYLOCAINE) 2 % solution Patient: Mix 1part 2% viscous lidocaine, 1part H20. Swish & swallow 96mL of diluted mixture, 27min before meals and at bedtime, up to QID PRN soreness. 150 mL 4   LIVALO 4 MG TABS TAKE 1/2TABLET DAILY 45 tablet 3   Multiple Vitamins-Minerals (CENTRUM PO) Take 1 tablet by mouth every evening.     pantoprazole (PROTONIX) 40 MG tablet TAKE 1 TABLET BY MOUTH ONCE DAILY 1/2 HOUR BEFORE A  MEAL 90 tablet 3   polyethylene glycol (MIRALAX / GLYCOLAX) 17 g packet Take 17 g by mouth daily. 14 each 0   Tiotropium Bromide-Olodaterol (STIOLTO RESPIMAT) 2.5-2.5 MCG/ACT AERS Inhale 2 puffs into the lungs daily. 4 g 0   amLODipine (NORVASC) 5 MG tablet Take 0.5 tablets (2.5 mg total) by mouth daily. (Patient not taking: Reported on 08/10/2022) 30 tablet 1   predniSONE (  DELTASONE) 50 MG tablet Take 1 tablet (50 mg total) by mouth daily. (Patient not taking: Reported on 08/10/2022) 5 tablet 0   Facility-Administered Medications Prior to Visit  Medication Dose Route Frequency Provider Last Rate Last Admin   0.9 %  sodium chloride infusion  250 mL Intravenous PRN Gold, Wayne E, PA-C       levalbuterol (XOPENEX) nebulizer solution 0.63 mg  0.63 mg Nebulization Q6H PRN Gaye Pollack, MD   0.63 mg at 09/29/17 1646   sodium chloride flush (NS) 0.9 % injection 3 mL  3 mL Intravenous Q12H Gold, Wayne E, PA-C       sodium chloride flush (NS) 0.9 % injection 3 mL  3 mL Intravenous Q12H Gold, Wayne E, PA-C       sodium chloride flush (NS) 0.9 % injection 3 mL  3 mL  Intravenous PRN Jadene Pierini E, PA-C       Final Medications at End of Visit    Current Meds  Medication Sig   ALPRAZolam (XANAX) 0.5 MG tablet Take 1 tablet (0.5 mg total) by mouth at bedtime as needed for anxiety.   AMBULATORY NON FORMULARY MEDICATION L AFO for foot drop Dispense 1 Dx code: M84.132 Use as needed   aspirin EC 81 MG tablet Take 1 tablet (81 mg total) by mouth daily. Swallow whole.   calcium carbonate (TUMS - DOSED IN MG ELEMENTAL CALCIUM) 500 MG chewable tablet Chew 1 tablet by mouth daily as needed for indigestion or heartburn.   ezetimibe (ZETIA) 10 MG tablet Take 5 mg by mouth daily.   ibuprofen (ADVIL) 400 MG tablet Take 1 tablet (400 mg total) by mouth every 4 (four) hours as needed. Take only as needed for throat/neck pain following radiation, as this medication can affect your kidneys. Taper as your pain improves. Take with food.   lidocaine (XYLOCAINE) 2 % solution Patient: Mix 1part 2% viscous lidocaine, 1part H20. Swish & swallow 38mL of diluted mixture, 80min before meals and at bedtime, up to QID PRN soreness.   LIVALO 4 MG TABS TAKE 1/2TABLET DAILY   Multiple Vitamins-Minerals (CENTRUM PO) Take 1 tablet by mouth every evening.   pantoprazole (PROTONIX) 40 MG tablet TAKE 1 TABLET BY MOUTH ONCE DAILY 1/2 HOUR BEFORE A  MEAL   polyethylene glycol (MIRALAX / GLYCOLAX) 17 g packet Take 17 g by mouth daily.   Tiotropium Bromide-Olodaterol (STIOLTO RESPIMAT) 2.5-2.5 MCG/ACT AERS Inhale 2 puffs into the lungs daily.   Radiology:   No results found.  Cardiac Studies:  Exercise treadmill stress test 07/07/2021: Exercise treadmill stress test performed using Bruce protocol.  Patient reached 1.5 METS, and 57% of age predicted maximum heart rate.  Exercise capacity was very low.  No chest pain reported.  Normal heart rate and hemodynamic response for the level of activity performed.  Rest and stress EKG at 57% MPHR showed showed sinus rhythm, occasional PVC. Inconclusive  study with very limited and submaximal effort.   Carotid artery duplex 04/22/2021:  Right Carotid: Velocities in the right ICA are consistent with a 40-59% stenosis. The ECA appears >50% stenosed.  Left Carotid: Velocities in the left ICA are consistent with a 40-59% stenosis. Hemodynamically significant plaque >50% visualized in the  CCA. The ECA appears >50% stenosed.  Vertebrals:  Right vertebral artery demonstrates antegrade flow. Left vertebral artery demonstrates bidirectional flow.  Subclavians: Bilateral subclavian arteries were stenotic.   Carotid artery duplex 08/06/2020:  Stenosis in the right internal carotid artery (16-49%). Stenosis  in the right common carotid artery (<50%). Stenosis in the right external carotid artery (<50%).  Stenosis in the left internal carotid artery (50-69%). Stenosis in the left common carotid artery (>50%). Stenosis in the left external carotid artery (<50%).  Antegrade right vertebral artery flow. Antegrade left vertebral artery flow.  Compared to 09/11/2017, there is progression of disease bilaterally, especially in common carotid arteries with homogenous plaque with high risk for embolic complications. Follow up in 6 months is appropriate if clinically indicated.   PCV ECHOCARDIOGRAM COMPLETE 67/20/9470 Normal LV systolic function with visual EF 55-60%. Left ventricle cavity is normal in size. Moderate concentric hypertrophy of the left ventricle. Normal global wall motion. Left atrial cavity is moderately dilated at 4.6 cm. Trileaflet aortic valve. Mild aortic stenosis. Trace aortic regurgitation. Moderate aortic valve leaflet calcification. Mildly restricted aortic valve leaflets. Peak PG of 19.2 and mean PG of 10.3 mm Hg and Calculated aortic valve area 1.7 cm. Normal mitral valve thickness.  Mild prolapse of the mitral valve leaflets. Moderate posteriorly directed  (Grade III) mitral regurgitation. Mild tricuspid regurgitation. No evidence of  pulmonary hypertension. RVSP measures 30 mmHg. Compared to 02/24/2016, MVP and MR new.   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 Previously noted 90% stenosis by angiogram on 05/01/2017 not noted.   Nuclear stress test   08/31/2017: 1. Pharmacologic stress testing was performed with intravenous administration of .4 mg of Lexiscan. 2. Stress EKG is non diagnostic for ischemia as it is a pharmacologic stress. 3. Gated SPECT images reveal normal myocardial thickening and wall motion. The left ventricular ejection fraction was calculated to be 60%. SPECT images demonstrate small perfusion abnormality of mild intensity in the basal inferior and mid inferior myocardial wall(s) on the stress images with mild reversibility on rest images. This could represent diaphragmatic attenuation. 4. This is a low risk study.   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 Right PT and left AT occluded below knee, but otherwise mild disease.  EKG:   EKG 07/13/2022: Sinus bradycardia at rate of 49 bpm.  Left axis deviation.  LVH with secondary ST-T changes.  IVCD. Compared to previous EKG on 01/12/22, RBBB is no longer present.  Assessment     ICD-10-CM   1. Primary hypertension  I10 CANCELED: EKG 12-Lead    2. Hyperlipidemia, group A  E78.00        Medications Discontinued During This Encounter  Medication Reason   amLODipine (NORVASC) 5 MG tablet Discontinued by provider   predniSONE (DELTASONE) 50 MG tablet Completed Course    No orders of the defined types were placed in this encounter.   Recommendations:   Sinus bradycardia He is not sure if he is taking Metoprolol. Advised to stop Metoprolol given bradycardia if he is still taking this medication. EKG today did not reveal RBBB, no evidence of ischemia.   Primary hypertension Blood pressure is slightly elevated at  today's visit but he does bring with him a log of home readings that are well controlled. Advised to continue medications as ordered. Encouraged him to continue to monitor blood pressure at home and keep a log of readings. Also, asked that he bring all medications to next visit.   Hyperlipidemia, group A Labs reviewed, lipids are well controlled. He continues on Livalo 4mg  daily. He is not currently exercising due to pain in foot for which he is seeing orthopedics and will have brace placed  at next visit with them.  Overall, he is doing well without any complaints.  Follow-up in 6 months month or sooner if needed.   Ernst Spell, AGNP-C 08/10/2022, 2:41 PM Office: [phone]

## 2022-08-10 NOTE — Addendum Note (Signed)
Addended by: Ernst Spell on: 08/10/2022 08:57 PM   Modules accepted: Level of Service

## 2022-08-10 NOTE — Therapy (Signed)
OUTPATIENT SPEECH LANGUAGE PATHOLOGY SWALLOW TREATMENT/RECERTIFICATION   Patient Name: Nathaniel Long MRN: [id number] DOB:1941-06-09, 81 y.o., male Today's Date: 08/10/2022  PCP: [name], MD REFERRING PROVIDER: Eppie Gibson, MD   End of Session - 08/10/22 2126     Visit Number 4    Number of Visits 6    Date for SLP Re-Evaluation 10/19/22    SLP Start Time 1620    SLP Stop Time  0388    SLP Time Calculation (min) 33 min    Activity Tolerance Patient tolerated treatment well              Past Medical History:  Diagnosis Date   Anxiety    Cancer ([place])    lung   Carotid artery stenosis    Cataract    Chronic lower back pain    CKD (chronic kidney disease)    Complication of anesthesia    "can't pee when I come out of OR" (07/18/2017)   Contact with chainsaw as cause of accidental injury 07/16/2017   sternal fracture with bilateral anterior fifth and sixth rib fractures /notes 07/16/2017   COPD (chronic obstructive pulmonary disease) ([place])    Dyspnea    GERD (gastroesophageal reflux disease)    Hard of hearing    High cholesterol    Hypertension    Peripheral vascular disease ([place])    Thoracic ascending aortic aneurysm [facility])    Past Surgical History:  Procedure Laterality Date   ABDOMINAL AORTOGRAM W/LOWER EXTREMITY N/A 05/01/2017   Procedure: Abdominal Aortogram w/Lower Extremity;  Surgeon: Adrian Prows, MD;  Location: [place] [facility];  Service: Cardiovascular;  Laterality: N/A;   BACK SURGERY     FINGER REPLANTATION Left    "ring finger"   HEMORRHOID SURGERY     POSTERIOR LUMBAR FUSION  1986   RENAL ANGIOGRAPHY Bilateral 12/18/2017   Procedure: RENAL ANGIOGRAPHY;  Surgeon: Nigel Mormon, MD;  Location: [place] [facility];  Service: Cardiovascular;  Laterality: Bilateral;   VIDEO ASSISTED THORACOSCOPY (VATS)/ LOBECTOMY Left 09/19/2017   Procedure: LEFT VIDEO ASSISTED THORACOSCOPY (VATS)/ LEFT UPPER LOBECTOMY, LYMPH NODE DISECTION;  Surgeon:  Melrose Nakayama, MD;  Location: [place];  Service: Thoracic;  Laterality: Left;   VIDEO BRONCHOSCOPY WITH ENDOBRONCHIAL NAVIGATION N/A 09/05/2017   Procedure: VIDEO BRONCHOSCOPY WITH ENDOBRONCHIAL NAVIGATION;  Surgeon: Collene Gobble, MD;  Location: [place];  Service: Thoracic;  Laterality: N/A;   VIDEO BRONCHOSCOPY WITH ENDOBRONCHIAL ULTRASOUND N/A 09/05/2017   Procedure: VIDEO BRONCHOSCOPY WITH ENDOBRONCHIAL ULTRASOUND;  Surgeon: Collene Gobble, MD;  Location: [place];  Service: Thoracic;  Laterality: N/A;   Patient Active Problem List   Diagnosis Date Noted   Encounter for preoperative dental examination 04/17/2022   Teeth missing 04/17/2022   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   Accretions on teeth 04/17/2022   Generalized gingival recession 04/17/2022   Excessive dental attrition 04/17/2022   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   Hematoma of right ankle 10/28/2021   GI bleed 06/18/2020   COVID-19 virus infection 06/18/2020   Abdominal pain 06/18/2020   CKD (chronic kidney disease) stage 3, GFR 30-59 ml/min ([place]) 06/18/2020   COPD (chronic obstructive pulmonary disease) ([place]) 06/18/2020   Hard of hearing 06/18/2020   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   Right renal artery stenosis ([place]) 12/17/2017   Hydropneumothorax 09/28/2017   S/P lobectomy of lung 09/19/2017   Malignant neoplasm of upper lobe of left lung ([place]) 09/13/2017   Pulmonary nodule, left 09/05/2017   Mediastinal  lymphadenopathy 09/05/2017   Multiple rib fractures 07/16/2017   Spondylolisthesis, lumbar region 06/18/2017   Right hip pain 05/23/2017   Chronic bilateral low back pain with bilateral sciatica 05/23/2017   Colon cancer screening 01/12/2017   Antiplatelet or antithrombotic long-term use 01/12/2017   PAD (peripheral artery disease) ([place]) 04/13/2011   Murmur, heart 12/08/2010   ACTINIC KERATOSIS, HEAD 07/14/2010   BRUISED ANKLE 07/14/2010   HYPERTENSION, BENIGN 11/19/2009   TOBACCO ABUSE  10/28/2009   EAR PAIN, LEFT 10/28/2009    ONSET DATE: Approx 12 months   REFERRING DIAG: 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 male who presented with the cc of ongoing left ear pain and throat pain to Dr. West Carbo on 02/23/22. Laryngoscopy performed by Dr. West Carbo during that visit revealed a mass on the posterior pharyngeal wall noted as slightly friable in appearance. Normal vocal cord mobility was appreciated without vocal cord nodule, mass, polyp or tumor. Hypopharynx also appeared normal without mass, pooling of secretions or aspiration.CT neck/thyroid w cont 03-07-22 showed:  No evidence of laryngeal mass. Question abnormal appearance of the  upper cervical esophagus, which may show mild thickening with adjacent edema, which could go along with esophagitis. No definite adenopathy. Single slightly prominent level 2 level 3 junction lymph node on the left with short axis dimension of 9-10 mm.  Bilateral carotid artery atherosclerotic disease with ICA stenoses  of 50-70%, not precisely evaluated using this technique. 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 The patient followed up with Dr. Redmond Baseman on 03/16/22 to discuss treatment options. Options including surgical resection with reconstruction and radiation treatment, vs primary chemoradiation were discussed with the patient. The patient will make a final decision following our consultation today and with medical oncology. Dr. Redmond Baseman also referred the patient to [facility] to discuss surgical management - pt chose radiation. 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 Recent chest CT showed no evidence of local recurrence or metastatic disease. " Pt began radiation on Thursday 05/04/22.  PAIN:  Are you having pain? No  FALLS: Has patient fallen in last 6 months?  No  LIVING ENVIRONMENT: Lives with: lives with their spouse Lives in: House/apartment  PLOF:  Level of assistance: Independent with ADLs Employment: Retired   PATIENT GOALS Maintain swallow function  OBJECTIVE:  MBS 07-25-22 Patient presents with an improved pharyngeal and cervical esophageal swallow as compared to MBS completed pre-radiation treatment on 05/24/2022. He continues to exhibit pharyngeal residuals in vallecular and pyriform sinuses with liquids and solids (increased in amount with puree and regular solids), but residuals clear with subsequent swallows. His primary dysphagia occurs in the cervical esophagus, with barium residuals remaining above UES as well as in cervical esophagus. Subsequent swallows as well as swallows of liquids did aid in transiting barium through UES and cervical esophagus. He exhibited the greatest amount of cervical esophageal retention with regular solids with moderate amount remaining s/p initial and second swallow. Although mild amount of retrograde movement of barium observed at level of cervical esophagus, once  boluses had transited through cervical esophagus, no further retrograde moovement observed. No penetration or aspiration occured at any phase of the swallow. SLP recommended patient continue on current diet but to swallow extra times 2-3 with each bite of solids and to alternate sips and bites to help keep boluses transiting through pharynx and cervical esophagus.  TODAY'S TREATMENT:  08/10/22: SLP used speech to text with MS Word to communicate with pt due to severe hearing impairment. Pt had MBS  two weeks ago and according to dtr in law has not followed precautions from that assessment. Pt eating, according to dietician note potato chips and tender steak, which are not on dysphagia III diet recommended on MBS. Today pt stated ""whatever I want" when asked what he eats. He req'd mod A occasionally for HEP procedure. SLP omitted [place] from his HEP to make less exercises for pt in hopes this will encourage him to complete HEP. Again today pt stated he thought he did not have to perform HEP due to the frequency of his POs. SLP ensured pt understood rationale for HEP (which pt acknowledged that he did) and understood procedure with each exercise. SLP wrote out simplified versions of pt's exercises and provided these to him. No overt s/sx of aspiration PNA today reported or observed.  Pt ate dys III (cereal bar) without overt s/sx of oral or pharyngeal deficits. SLP reviewed facets of pt's MBS with him reinforcing the need to complete HEP and follow precautions from [place].   07/10/22: ST took place with SLP writing text as pt has profound hearing impairment. Pt with wide variety of food items in last 2-3 days however he also has wheezing with inhalation and exhalation. POs resulted in coughing immediately after swallow x2/7 bites of cereal bar, one rather violently. Pt took sips of water from cup. Pt did not follow any of swallow precautions from [place] in August. SLP cued pt to follow precautions and strongly urged pt to do so from this time onward. Due to pt's coughing with POs and not following precautions, SLP recommends follow up MBS. Noted from last session pt without any overt s/sx aspiration during POs. In August MBS, pt showed some sensation to aspiration. SLP reviewed reasoning for SLP suggesting MBS for pt - pt demonstrated understanding. Pt has not been completing HEP as prescribed. "I'm eating all the time and so I figured I didn't need to do them." SLP corrected pt and provided rationale for HEP  including possible end point of not completing HEP (hospitalization for PNA). "I don't want a hospital stay," pt stated. SLP provided usual max A for exercises. As below, last session pt's cueing level was less - suggesting that pt has likely not been completing HEP very little since last session.   06/08/22: Pt with thick saliva reportedly thinned/cleared with use of Mucinex since 10-14 days ago. "My swallowing has totally changed," pt stated. Today he ate [place] sandwich, and drank thin liquids without overt s/s of oral difficulty other than prolonged mastication which pt states is what he does to improve pharyngeal transit. Suspected more difficult but manageable pharyngeal transit given fine mastication. DIL Sharyn Lull also agrees pt without overt s/sx aspiration during meals she has seen pt eat in the last ~14 days.  HEP completed today for usual mod A for tongue protrusion with [place]. Pt req'd fading cues to min A occasionally by sesison end. He was encouraged to complete [place] in front of a mirror for ensuring proper procedure.  SLP also  reviewed precautions from [facility] on 05-24-22 and pt stated, "I'm pretty much doing all that." With POs pt not always swallowing additional swallows and so SLP cued pt to do so. After this pt adhered to mulitple swallows.   (05/09/22 - eval) Research states the risk for dysphagia increases due to radiation ttreatment due to a variety of factors, so SLP educated the pt about the possibility of reduced/limited ability for PO intake during rad tx. SLP also educated pt regarding possible changes to swallowing musculature after rad tx, and why adherence to dysphagia HEP provided today and PO consumption was necessary to inhibit muscle fibrosis following rad tx and to mitigate muscle disuse atrophy. SLP informed pt why this would be detrimental to his swallowing status and to their pulmonary health. Pt demonstrated understanding of these things to SLP. SLP encouraged pt to safely eat  and drink as deep into their radiation/chemotherapy as possible to provide the best possible long-term swallowing outcome for pt.    SLP then developed an individualized HEP for pt involving oral and pharyngeal strengthening and ROM and pt was instructed how to perform these exercises, including SLP demonstration. After SLP demonstration, pt return demonstrated each exercise. SLP ensured pt performance was correct prior to educating pt on next exercise. Pt is extremely hard of hearing so SLP used speech to text function on SLP's phone to ensure pt understanding. Pt required SBA to perform HEP. "Dub" was instructed to complete this program 6-7 days/week, at least 2 times a day until 6 months after his last day of rad tx, and then x2 a week after that, indefinitely. Among other modifications for days when pt cannot functionally swallow, SLP also suggested pt cycle through the HEP so the full program of exercises can be completed instead of fatiguing on one of the swallowing exercises and being unable to perform the other swallowing exercises.    PATIENT EDUCATION: Education details: as above in "today's treatment" Person educated: Patient, daughter in [employer] [place] Education method: Explanation, Speech-to-text, Demonstration, Verbal cues, and Handouts Education comprehension: verbalized understanding, returned demonstration, verbal cues required, and needs further education   ASSESSMENT:  CLINICAL IMPRESSION: RECERTIFICATION TODAY. Please see goal status below and current tx session, above. Patient is a 81 y.o. male who was seen today for treatment for swallowing having completed radiation therapy. Pt has chosen not to perform HEP. SLP explained rationale (again) for pt. Today pt ate cereal bar, and drank thin liquids without pt following aspiration precautions from [facility] 07/25/22. At this time pt swallowing with dys III/thin is deemed [facility] with precautions listed from his MBSS on 07-25-22. Data indicate  that pt's swallow ability will likely decrease even more over the course of radiation therapy and could very well decline over time following the conclusion of that therapy due to muscle disuse atrophy and/or muscle fibrosis. Pt will cont to need to be seen by SLP in order to assess safety of PO intake, assess the need for recommending any objective swallow assessment, and ensuring pt is correctly completing the individualized HEP.   OBJECTIVE IMPAIRMENTS include dysphagia. These impairments are limiting patient from safety when swallowing. Factors affecting potential to achieve goals and functional outcome are pre-radiation swallowing difficulty . Patient will benefit from skilled SLP services to address above impairments and improve overall function.   REHAB POTENTIAL: Good   GOALS: Goals reviewed with patient? No  SHORT TERM GOALS: Target date:  2 sessions (3rd total session)     pt will complete HEP  with rare min A  Baseline: Goal status: Not met   2.  pt will tell SLP why pt is completing HEP with modified independence Baseline:  Goal status: Not met   3.  pt will describe 3 overt s/s aspiration PNA with modified independence Baseline:  Goal status: Met   4.  pt will tell SLP how a food journal could hasten return to a more normalized diet Baseline:  Goal status: Deferred due to pt eating his desired diet   LONG TERM GOALS: Target date:  6th session   (7th total session)  pt will complete HEP with modified independnence in 3 sessions Baseline:  Goal status: Ongoing   2.  pt will describe how to modify HEP over time, and the timeline associated with reduction in HEP frequency with modified independence over two sessions Baseline:  Goal status: Ongoing  3.   Pt will tell SLP rationale for completing HEP   Baseline:  Goal status: Ongoing from STGs   PLAN: SLP FREQUENCY: approx 1x/month.  SLP DURATION:  7 sessions total  PLANNED INTERVENTIONS: Aspiration precaution  training, Pharyngeal strengthening exercises, Diet toleration management , Environmental controls, Trials of upgraded texture/liquids, Cueing hierachy, Internal/external aids, Oral motor exercises, SLP instruction and feedback, Compensatory strategies, Patient/family education, and MBSS    Oakley, Millen 08/10/2022, 9:29 PM

## 2022-08-14 ENCOUNTER — Ambulatory Visit: Payer: Medicare Other

## 2022-08-14 DIAGNOSIS — I6523 Occlusion and stenosis of bilateral carotid arteries: Secondary | ICD-10-CM

## 2022-08-15 ENCOUNTER — Ambulatory Visit (INDEPENDENT_AMBULATORY_CARE_PROVIDER_SITE_OTHER): Payer: Medicare Other | Admitting: Family Medicine

## 2022-08-15 VITALS — BP 130/62 | HR 46 | Ht 69.0 in | Wt 159.0 lb

## 2022-08-15 DIAGNOSIS — M21372 Foot drop, left foot: Secondary | ICD-10-CM

## 2022-08-15 NOTE — Patient Instructions (Signed)
Thank you for coming in today.   Please call [facility] at [phone] to schedule your spine injection.    Get the AFO brace.   Recheck with me as needed.

## 2022-08-15 NOTE — Progress Notes (Signed)
I, Peterson Lombard, LAT, ATC acting as a scribe for [name], MD.  Nathaniel Long is a 81 y.o. male who presents to [place] at [facility] today for f/u for L foot drop after ESI. 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  L-spine MRI from 2018 does show compression at L5. pt was last seen by Dr. Georgina Snell on 07/18/22 imaging was obtained/ordered and patient was prescribed prednisone and an AFO.  Based on new MRI findings and an ESI was ordered, and later performed on 11/7.  Today, patient reports brief relief of numbness after the ESI, but then it returned. Pt notes R hip pain is somewhat better. Pt has an appointment on Thurs at the [facility] for the AFO.   Dx imaging: 07/24/2022 L-spine MRI  07/18/2022 L-spine & left foot x-ray 06/15/17 L-spine MRI   Pertinent review of systems: No fevers or chills  Relevant historical information: Lung cancer.  Has a repeat PET scan coming up next month.   Exam:  BP 130/62   Pulse (!) 46   Ht 5\' 9"  (1.753 m)   Wt 159 lb (72.1 kg)   SpO2 98%   BMI 23.48 kg/m  General: Well Developed, well nourished, and in no acute distress.   MSK: L-spine nontender midline.  Left leg weakness to foot dorsiflexion considerable.  Strength is 3-/5    Lab and Radiology Results   XAM: MRI LUMBAR SPINE WITHOUT CONTRAST   TECHNIQUE: Multiplanar, multisequence MR imaging of the lumbar spine was performed. No intravenous contrast was administered.   COMPARISON:  MRI of the lumbar spine April 10, 2018.   FINDINGS: Segmentation:  Standard.   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 Trace retrolisthesis of L2 over L3.   Vertebrae: No acute fracture, evidence of discitis, or bone lesion. Chronic compression fracture of the superior endplate of L3 with approximately 50% height loss anteriorly.   Conus medullaris and  cauda equina: Conus extends to the L1-2 level. Conus and cauda equina appear normal.   Paraspinal and other soft tissues: Fatty atrophy of the lower lumbosacral paraspinal musculature.   Disc levels:   T12-L1: No spinal canal or neural foraminal stenosis.   L1-2: Left asymmetric disc bulge and mild hypertrophic facet degenerative changes resulting in mild narrowing of the left subarticular zone and mild-to-moderate left neural foraminal narrowing. Findings have progressed since prior MRI.   L2-3: Loss of disc height, disc bulge with associated osteophytic component, moderate hypertrophic facet degenerative changes and ligamentum flavum redundancy. Interval resolution of focal disc protrusion. Findings result in mild spinal canal stenosis with moderate narrowing of the bilateral subarticular zones, greater on the left, and moderate bilateral neural foraminal narrowing. The degree of neural foraminal stenosis has progressed compared to prior MRI of while the thecal sac appear slightly more patent.   L3-4: Shallow disc bulge and moderate hypertrophic facet degenerative changes without significant spinal canal or neural foraminal stenosis.   L4-5: A shallow disc bulge and moderate hypertrophic facet degenerative changes without significant spinal canal or neural foraminal stenosis.   L5-S1: Anterolisthesis status post solid fusion. No significant spinal canal stenosis. Chronic bone a foraminal stenosis which appear progressed on the right, now severe (series 2 and 3, image 4). Foraminal stenosis moderate on the left and appears stable.   IMPRESSION: 1. Mild narrowing of the left subarticular zone and mild-to-moderate left neural  foraminal narrowing at L1-2. 2. Mild spinal canal stenosis with moderate narrowing of the bilateral subarticular zones and moderate bilateral neural foraminal narrowing at L2-3. 3. 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 Severe right and moderate left neural foraminal stenosis at this level.     Electronically Signed   By: Pedro Earls M.D.   On: 07/26/2022 09:14 I, [name], personally (independently) visualized and performed the interpretation of the images attached in this note.     Assessment and Plan: 81 y.o. 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  Plan for repeat epidural steroid injection or nerve root block.  The injection 7 days ago on November 7 did seem to help so a repeat injection certainly is reasonable at this point.  He would like to avoid back surgery.  He does not want to make any major medical decisions until after his PET scan next month.  If his scan looks bad he does not want to pursue aggressive treatment.  He has an appointment set up with the [place] [facility] in 2 days to get set up for his AFO which I think will be very helpful for fall prevention.  Check back with me as needed.   PDMP not reviewed this encounter. Orders Placed This Encounter  Procedures   DG INJECT DIAG/THERA/INC NEEDLE/CATH/PLC EPI/LUMB/SAC W/IMG    Standing Status:   Future    Standing Expiration Date:   08/16/2023    Order Specific Question:   Reason for Exam (SYMPTOM  OR DIAGNOSIS REQUIRED)    Answer:   esi or nerve root block lumbar spine. Repeat. Left foot drop. Level and technique per radiology    Order Specific Question:   Preferred Imaging Location?    Answer:   [id number]    Order Specific Question:   Radiology Contrast Protocol - do NOT remove file path    Answer:   [email or web address]   No orders of the defined types were placed in this encounter.    Discussed warning signs or symptoms. Please see discharge instructions. Patient expresses understanding.   The above documentation has been reviewed and is accurate and complete [name], M.D.

## 2022-08-31 ENCOUNTER — Ambulatory Visit
Admission: RE | Admit: 2022-08-31 | Discharge: 2022-08-31 | Disposition: A | Discharge status: Home / Self Care | Payer: Medicare Other | Source: Ambulatory Visit | Attending: Family Medicine | Admitting: Family Medicine

## 2022-08-31 DIAGNOSIS — M21372 Foot drop, left foot: Secondary | ICD-10-CM

## 2022-08-31 MED ORDER — METHYLPREDNISOLONE ACETATE 40 MG/ML INJ SUSP (RADIOLOG
80.0000 mg | Freq: Once | INTRAMUSCULAR | Status: AC
  Administered 2022-08-31: 80 mg via EPIDURAL

## 2022-08-31 MED ORDER — IOPAMIDOL (ISOVUE-M 200) INJECTION 41%
1.0000 mL | Freq: Once | INTRAMUSCULAR | Status: AC
  Administered 2022-08-31: 1 mL via EPIDURAL

## 2022-08-31 NOTE — Discharge Instructions (Signed)

## 2022-09-12 NOTE — Progress Notes (Signed)
Mr. Hanrahan is here for follow up for completion of treatment for pharynx cancer.  He completed treatment on 06-23-22. We will review pet scan results with pt at this visit.    Pain issues, if any: no pain to report Using a feeding tube?: no Weight changes, if any:  Wt Readings from Last 3 Encounters:  09/19/22 168 lb 4 oz (76.3 kg)  08/15/22 159 lb (72.1 kg)  08/10/22 159 lb 12.8 oz (72.5 kg)    Swallowing issues, if any: swallowing doing well especially when watching liquids with food Smoking or chewing tobacco? no Using fluoride trays daily? no Last ENT visit was on: none since since radiation treatment Other notable issues, if any:  drinking ensures at home still, getting pet scan results today  Vitals:   09/19/22 1517  BP: 137/67  Pulse: (!) 50  Resp: 18  Temp: (!) 96.5 F (35.8 C)  SpO2: 100%

## 2022-09-13 ENCOUNTER — Encounter (HOSPITAL_COMMUNITY)
Admission: RE | Admit: 2022-09-13 | Discharge: 2022-09-13 | Disposition: A | Discharge status: Home / Self Care | Payer: Medicare Other | Source: Ambulatory Visit | Attending: Radiation Oncology | Admitting: Radiation Oncology

## 2022-09-13 ENCOUNTER — Encounter: Payer: Self-pay | Admitting: Nutrition

## 2022-09-13 DIAGNOSIS — C140 Malignant neoplasm of pharynx, unspecified: Secondary | ICD-10-CM | POA: Insufficient documentation

## 2022-09-13 LAB — GLUCOSE, CAPILLARY: Glucose-Capillary: 89 mg/dL (ref 70–99)

## 2022-09-13 MED ORDER — FLUDEOXYGLUCOSE F - 18 (FDG) INJECTION
10.0000 | Freq: Once | INTRAVENOUS | Status: AC
  Administered 2022-09-13: 8.5 via INTRAVENOUS

## 2022-09-13 NOTE — Progress Notes (Signed)
Provided one complimentary case of ensure high protein.

## 2022-09-14 ENCOUNTER — Ambulatory Visit: Payer: Medicare Other | Attending: Radiation Oncology

## 2022-09-14 DIAGNOSIS — R1313 Dysphagia, pharyngeal phase: Secondary | ICD-10-CM | POA: Insufficient documentation

## 2022-09-14 NOTE — Therapy (Signed)
OUTPATIENT SPEECH LANGUAGE PATHOLOGY SWALLOW TREATMENT   Patient Name: Nathaniel Long MRN: [id number] DOB:1941/08/21, 81 y.o., male Today's Date: 09/14/2022  PCP: [name], MD REFERRING PROVIDER: Eppie Gibson, MD   End of Session - 09/14/22 1753     Visit Number 5    Number of Visits 6    Date for SLP Re-Evaluation 10/19/22    SLP Start Time 15    SLP Stop Time  5465    SLP Time Calculation (min) 35 min    Activity Tolerance Patient tolerated treatment well               Past Medical History:  Diagnosis Date   Anxiety    Cancer ([place])    lung   Carotid artery stenosis    Cataract    Chronic lower back pain    CKD (chronic kidney disease)    Complication of anesthesia    "can't pee when I come out of OR" (07/18/2017)   Contact with chainsaw as cause of accidental injury 07/16/2017   sternal fracture with bilateral anterior fifth and sixth rib fractures /notes 07/16/2017   COPD (chronic obstructive pulmonary disease) ([place])    Dyspnea    GERD (gastroesophageal reflux disease)    Hard of hearing    High cholesterol    Hypertension    Peripheral vascular disease ([place])    Thoracic ascending aortic aneurysm [facility])    Past Surgical History:  Procedure Laterality Date   ABDOMINAL AORTOGRAM W/LOWER EXTREMITY N/A 05/01/2017   Procedure: Abdominal Aortogram w/Lower Extremity;  Surgeon: Adrian Prows, MD;  Location: [place] [facility];  Service: Cardiovascular;  Laterality: N/A;   BACK SURGERY     FINGER REPLANTATION Left    "ring finger"   HEMORRHOID SURGERY     POSTERIOR LUMBAR FUSION  1986   RENAL ANGIOGRAPHY Bilateral 12/18/2017   Procedure: RENAL ANGIOGRAPHY;  Surgeon: Nigel Mormon, MD;  Location: [place] [facility];  Service: Cardiovascular;  Laterality: Bilateral;   VIDEO ASSISTED THORACOSCOPY (VATS)/ LOBECTOMY Left 09/19/2017   Procedure: LEFT VIDEO ASSISTED THORACOSCOPY (VATS)/ LEFT UPPER LOBECTOMY, LYMPH NODE DISECTION;  Surgeon: Melrose Nakayama, MD;  Location: [place];  Service: Thoracic;  Laterality: Left;   VIDEO BRONCHOSCOPY WITH ENDOBRONCHIAL NAVIGATION N/A 09/05/2017   Procedure: VIDEO BRONCHOSCOPY WITH ENDOBRONCHIAL NAVIGATION;  Surgeon: Collene Gobble, MD;  Location: [place];  Service: Thoracic;  Laterality: N/A;   VIDEO BRONCHOSCOPY WITH ENDOBRONCHIAL ULTRASOUND N/A 09/05/2017   Procedure: VIDEO BRONCHOSCOPY WITH ENDOBRONCHIAL ULTRASOUND;  Surgeon: Collene Gobble, MD;  Location: [place];  Service: Thoracic;  Laterality: N/A;   Patient Active Problem List   Diagnosis Date Noted   Encounter for preoperative dental examination 04/17/2022   Teeth missing 04/17/2022   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   Accretions on teeth 04/17/2022   Generalized gingival recession 04/17/2022   Excessive dental attrition 04/17/2022   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   Hematoma of right ankle 10/28/2021   GI bleed 06/18/2020   COVID-19 virus infection 06/18/2020   Abdominal pain 06/18/2020   CKD (chronic kidney disease) stage 3, GFR 30-59 ml/min ([place]) 06/18/2020   COPD (chronic obstructive pulmonary disease) ([place]) 06/18/2020   Hard of hearing 06/18/2020   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   Right renal artery stenosis ([place]) 12/17/2017   Hydropneumothorax 09/28/2017   S/P lobectomy of lung 09/19/2017   Malignant neoplasm of upper lobe of left lung ([place]) 09/13/2017   Pulmonary nodule, left 09/05/2017  Mediastinal lymphadenopathy 09/05/2017   Multiple rib fractures 07/16/2017   Spondylolisthesis, lumbar region 06/18/2017   Right hip pain 05/23/2017   Chronic bilateral low back pain with bilateral sciatica 05/23/2017   Colon cancer screening 01/12/2017   Antiplatelet or antithrombotic long-term use 01/12/2017   PAD (peripheral artery disease) ([place]) 04/13/2011   Murmur, heart 12/08/2010   ACTINIC KERATOSIS, HEAD 07/14/2010   BRUISED ANKLE 07/14/2010   HYPERTENSION, BENIGN 11/19/2009   TOBACCO ABUSE 10/28/2009   EAR  PAIN, LEFT 10/28/2009    ONSET DATE: Approx 12 months   REFERRING DIAG: 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 male who presented with the cc of ongoing left ear pain and throat pain to Dr. West Carbo on 02/23/22. Laryngoscopy performed by Dr. West Carbo during that visit revealed a mass on the posterior pharyngeal wall noted as slightly friable in appearance. Normal vocal cord mobility was appreciated without vocal cord nodule, mass, polyp or tumor. Hypopharynx also appeared normal without mass, pooling of secretions or aspiration.CT neck/thyroid w cont 03-07-22 showed:  No evidence of laryngeal mass. Question abnormal appearance of the  upper cervical esophagus, which may show mild thickening with adjacent edema, which could go along with esophagitis. No definite adenopathy. Single slightly prominent level 2 level 3 junction lymph node on the left with short axis dimension of 9-10 mm.  Bilateral carotid artery atherosclerotic disease with ICA stenoses  of 50-70%, not precisely evaluated using this technique. 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 The patient followed up with Dr. Redmond Baseman on 03/16/22 to discuss treatment options. Options including surgical resection with reconstruction and radiation treatment, vs primary chemoradiation were discussed with the patient. The patient will make a final decision following our consultation today and with medical oncology. Dr. Redmond Baseman also referred the patient to [facility] to discuss surgical management -  pt chose radiation. 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 Recent chest CT showed no evidence of local recurrence or metastatic disease. " Pt began radiation on Thursday 05/04/22.  PAIN:  Are you having pain? No  FALLS: Has patient fallen in last 6 months?  No  LIVING ENVIRONMENT: Lives with: lives with their spouse Lives in: House/apartment  PLOF:  Level of assistance: Independent with ADLs Employment: Retired   PATIENT GOALS Maintain swallow function  OBJECTIVE:  MBS 07-25-22 Patient presents with an improved pharyngeal and cervical esophageal swallow as compared to MBS completed pre-radiation treatment on 05/24/2022. He continues to exhibit pharyngeal residuals in vallecular and pyriform sinuses with liquids and solids (increased in amount with puree and regular solids), but residuals clear with subsequent swallows. His primary dysphagia occurs in the cervical esophagus, with barium residuals remaining above UES as well as in cervical esophagus. Subsequent swallows as well as swallows of liquids did aid in transiting barium through UES and cervical esophagus. He exhibited the greatest amount of cervical esophageal retention with regular solids with moderate amount remaining s/p initial and second swallow. Although mild  amount of retrograde movement of barium observed at level of cervical esophagus, once boluses had transited through cervical esophagus, no further retrograde moovement observed. No penetration or aspiration occured at any phase of the swallow. SLP recommended patient continue on current diet but to swallow extra times 2-3 with each bite of solids and to alternate sips and bites to help keep boluses transiting through pharynx and cervical esophagus.  TODAY'S TREATMENT:  09/14/22: SLP used speech to text with MS Word to communicate with pt due to  severe hearing impairment. SLP reminded pt that last session he was reminded that he needs to perform HEP SEPARATE from eating. Pt cont to report eating "anything". SLP ascertained this meant dys III and some select regular diet items. Pt req'd usual mod-max cues for HEP - which makes SLP suspect pt has not been performing "every day" as pt maintained he has been doing. Pt was modified independent with each exercise by session end.  Today he ate cereal bar and drank water without signs of oral, or overt s/sx of pharyngeal deficits. As he ate, pt told SLP his swallowing function is WNL. SLP told pt he was doing well and it was still important for him to follow up with SLP - he agreed to return in two months.  08/10/22: SLP used speech to text with MS Word to communicate with pt due to severe hearing impairment. Pt had MBS two weeks ago and according to dtr in law has not followed precautions from that assessment. Pt eating, according to dietician note potato chips and tender steak, which are not on dysphagia III diet recommended on MBS. Today pt stated ""whatever I want" when asked what he eats. He req'd mod A occasionally for HEP procedure. SLP omitted [place] from his HEP to make less exercises for pt in hopes this will encourage him to complete HEP. Again today pt stated he thought he did not have to perform HEP due to the frequency of his POs. SLP ensured pt understood rationale for HEP (which pt acknowledged that he did) and understood procedure with each exercise. SLP wrote out simplified versions of pt's exercises and provided these to him. No overt s/sx of aspiration PNA today reported or observed.  Pt ate dys III (cereal bar) without overt s/sx of oral or pharyngeal deficits. SLP reviewed facets of pt's MBS with him reinforcing the need to complete HEP and follow precautions from [place].   07/10/22: ST took place with SLP writing text as pt has profound hearing impairment. Pt with wide variety of food items  in last 2-3 days however he also has wheezing with inhalation and exhalation. POs resulted in coughing immediately after swallow x2/7 bites of cereal bar, one rather violently. Pt took sips of water from cup. Pt did not follow any of swallow precautions from [place] in August. SLP cued pt to follow precautions and strongly urged pt to do so from this time onward. Due to pt's coughing with POs and not following precautions, SLP recommends follow up MBS. Noted from last session pt without any overt s/sx aspiration during POs. In August MBS, pt showed some sensation to aspiration. SLP reviewed reasoning for SLP suggesting MBS for pt - pt demonstrated understanding. Pt has not been completing HEP as prescribed. "I'm eating all the time and so I figured I didn't need to do them." SLP corrected pt and provided rationale for HEP including possible end point of not completing HEP (hospitalization for PNA). "I don't want a hospital  stay," pt stated. SLP provided usual max A for exercises. As below, last session pt's cueing level was less - suggesting that pt has likely not been completing HEP very little since last session.   06/08/22: Pt with thick saliva reportedly thinned/cleared with use of Mucinex since 10-14 days ago. "My swallowing has totally changed," pt stated. Today he ate [place] sandwich, and drank thin liquids without overt s/s of oral difficulty other than prolonged mastication which pt states is what he does to improve pharyngeal transit. Suspected more difficult but manageable pharyngeal transit given fine mastication. DIL Sharyn Lull also agrees pt without overt s/sx aspiration during meals she has seen pt eat in the last ~14 days.  HEP completed today for usual mod A for tongue protrusion with [place]. Pt req'd fading cues to min A occasionally by sesison end. He was encouraged to complete [place] in front of a mirror for ensuring proper procedure.  SLP also reviewed precautions from [facility] on 05-24-22 and pt stated,  "I'm pretty much doing all that." With POs pt not always swallowing additional swallows and so SLP cued pt to do so. After this pt adhered to mulitple swallows.   (05/09/22 - eval) Research states the risk for dysphagia increases due to radiation ttreatment due to a variety of factors, so SLP educated the pt about the possibility of reduced/limited ability for PO intake during rad tx. SLP also educated pt regarding possible changes to swallowing musculature after rad tx, and why adherence to dysphagia HEP provided today and PO consumption was necessary to inhibit muscle fibrosis following rad tx and to mitigate muscle disuse atrophy. SLP informed pt why this would be detrimental to his swallowing status and to their pulmonary health. Pt demonstrated understanding of these things to SLP. SLP encouraged pt to safely eat and drink as deep into their radiation/chemotherapy as possible to provide the best possible long-term swallowing outcome for pt.    SLP then developed an individualized HEP for pt involving oral and pharyngeal strengthening and ROM and pt was instructed how to perform these exercises, including SLP demonstration. After SLP demonstration, pt return demonstrated each exercise. SLP ensured pt performance was correct prior to educating pt on next exercise. Pt is extremely hard of hearing so SLP used speech to text function on SLP's phone to ensure pt understanding. Pt required SBA to perform HEP. "Dub" was instructed to complete this program 6-7 days/week, at least 2 times a day until 6 months after his last day of rad tx, and then x2 a week after that, indefinitely. Among other modifications for days when pt cannot functionally swallow, SLP also suggested pt cycle through the HEP so the full program of exercises can be completed instead of fatiguing on one of the swallowing exercises and being unable to perform the other swallowing exercises.    PATIENT EDUCATION: Education details: as above in  "today's treatment" Person educated: Patient, daughter in Set designer Education method: Explanation, Speech-to-text, Demonstration, Verbal cues, and Handouts Education comprehension: verbalized understanding, returned demonstration, verbal cues required, and needs further education   ASSESSMENT:  CLINICAL IMPRESSION: Patient is a 81 y.o. male who was seen today for treatment for swallowing having completed radiation therapy. Pt has chosen not to perform HEP as directed. SLP explained rationale (again) for pt. Today pt again ate cereal bar, and drank thin liquids, without pt following aspiration precautions from [facility] 07/25/22. At this time pt swallowing with dys III/thin is deemed [facility] with precautions listed from his MBSS on  07-25-22. Data indicate that pt's swallow ability will likely decrease even more over the course of radiation therapy and could very well decline over time following the conclusion of that therapy due to muscle disuse atrophy and/or muscle fibrosis. Pt will cont to need to be seen by SLP in order to assess safety of PO intake, assess the need for recommending any objective swallow assessment, and ensuring pt is correctly completing the individualized HEP.   OBJECTIVE IMPAIRMENTS include dysphagia. These impairments are limiting patient from safety when swallowing. Factors affecting potential to achieve goals and functional outcome are pre-radiation swallowing difficulty . Patient will benefit from skilled SLP services to address above impairments and improve overall function.   REHAB POTENTIAL: Good   GOALS: Goals reviewed with patient? No  SHORT TERM GOALS: Target date:  2 sessions (3rd total session)     pt will complete HEP with rare min A  Baseline: Goal status: Not met   2.  pt will tell SLP why pt is completing HEP with modified independence Baseline:  Goal status: Not met   3.  pt will describe 3 overt s/s aspiration PNA with modified independence Baseline:   Goal status: Met   4.  pt will tell SLP how a food journal could hasten return to a more normalized diet Baseline:  Goal status: Deferred due to pt eating his desired diet   LONG TERM GOALS: Target date:  6th session   (7th total session)  pt will complete HEP with modified independnence in 3 sessions Baseline:  Goal status: Ongoing   2.  pt will describe how to modify HEP over time, and the timeline associated with reduction in HEP frequency with modified independence over two sessions Baseline:  Goal status: Ongoing  3.   Pt will tell SLP rationale for completing HEP   Baseline:   Goal status: Met   PLAN: SLP FREQUENCY: approx 1x/month.  SLP DURATION:  7 sessions total  PLANNED INTERVENTIONS: Aspiration precaution training, Pharyngeal strengthening exercises, Diet toleration management , Environmental controls, Trials of upgraded texture/liquids, Cueing hierachy, Internal/external aids, Oral motor exercises, SLP instruction and feedback, Compensatory strategies, Patient/family education, and MBSS    [place], [place] 09/14/2022, 5:54 PM

## 2022-09-19 ENCOUNTER — Encounter: Payer: Self-pay | Admitting: Nutrition

## 2022-09-19 ENCOUNTER — Ambulatory Visit
Admission: RE | Admit: 2022-09-19 | Discharge: 2022-09-19 | Disposition: A | Discharge status: Home / Self Care | Payer: Medicare Other | Source: Ambulatory Visit | Attending: Radiation Oncology | Admitting: Radiation Oncology

## 2022-09-19 ENCOUNTER — Encounter: Payer: Self-pay | Admitting: Radiation Oncology

## 2022-09-19 VITALS — BP 137/67 | HR 50 | Temp 96.5°F | Resp 18 | Ht 69.0 in | Wt 168.2 lb

## 2022-09-19 DIAGNOSIS — C140 Malignant neoplasm of pharynx, unspecified: Secondary | ICD-10-CM | POA: Insufficient documentation

## 2022-09-19 DIAGNOSIS — Z1329 Encounter for screening for other suspected endocrine disorder: Secondary | ICD-10-CM | POA: Diagnosis present

## 2022-09-19 NOTE — Progress Notes (Signed)
Radiation Oncology         [phone] ________________________________  Name: Nathaniel Long MRN: [id number]  Date: 09/19/2022  DOB: Jan 31, 1941  Follow-Up Visit Note  CC: [name], MD  [name], MD  Diagnosis and Prior Radiotherapy:       ICD-10-CM   1. 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       Cancer Staging  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HCC) Staging form: Pharynx - P16 Negative Oropharynx, AJCC 8th Edition - Clinical stage from 04/25/2022: Stage III (cT3, cN1, cM0, p16-) - Signed by Eppie Gibson, MD on 04/25/2022 Stage prefix: Initial diagnosis  Malignant neoplasm of upper lobe of left lung [facility]) Staging form: Lung, AJCC 8th Edition - Clinical stage from 09/13/2017: Stage IA2 (cT1b, cN0, cM0) - Unsigned Laterality: Left Tumor size (mm): 16   Radiation Treatment Dates: 05/04/2022 through 06/23/2022 Site Technique Total Dose (Gy) Dose per Fx (Gy) Completed Fx Beam Energies  Neck: HN_pharynx IMRT 70/70 2 35/35 6X    CHIEF COMPLAINT:  Here for follow-up and surveillance of throat cancer  Narrative:  The patient returns today for routine follow-up.  Nathaniel Long presents today for follow-up after completing radiation to his pharynx on 06/23/2022. He most recently received a PET scan on 09/13/22 which showed a marked interval response to therapy. No concerning signs for disease recurrence or metastasis.   Pain issues, if any: no pain to report Using a feeding tube?: no Weight changes, if any:     Wt Readings from Last 3 Encounters:  09/19/22 168 lb 4 oz (76.3 kg)  08/15/22 159 lb (72.1 kg)  08/10/22 159 lb 12.8 oz (72.5 kg)   Swallowing issues, if any: swallowing doing well especially when watching liquids with food Smoking or chewing tobacco? no Using fluoride trays daily? no Last ENT visit was on: none since since radiation treatment Other notable issues, if any:  drinking ensures at home still, getting pet scan results today  ALLERGIES:  is allergic  to gabapentin and statins.  Meds: Current Outpatient Medications  Medication Sig Dispense Refill   ALPRAZolam (XANAX) 0.5 MG tablet Take 1 tablet (0.5 mg total) by mouth at bedtime as needed for anxiety. 90 tablet 1   AMBULATORY NON FORMULARY MEDICATION L AFO for foot drop Dispense 1 Dx code: M21.372 Use as needed 1 Units 0   aspirin EC 81 MG tablet Take 1 tablet (81 mg total) by mouth daily. Swallow whole. 90 tablet 3   calcium carbonate (TUMS - DOSED IN MG ELEMENTAL CALCIUM) 500 MG chewable tablet Chew 1 tablet by mouth daily as needed for indigestion or heartburn.     ezetimibe (ZETIA) 10 MG tablet Take 5 mg by mouth daily.  3   ibuprofen (ADVIL) 400 MG tablet Take 1 tablet (400 mg total) by mouth every 4 (four) hours as needed. Take only as needed for throat/neck pain following radiation, as this medication can affect your kidneys. Taper as your pain improves. Take with food. 60 tablet 0   lidocaine (XYLOCAINE) 2 % solution Patient: Mix 1part 2% viscous lidocaine, 1part H20. Swish & swallow 63mL of diluted mixture, 110min before meals and at bedtime, up to QID PRN soreness. 150 mL 4   LIVALO 4 MG TABS TAKE 1/2TABLET DAILY 45 tablet 3   Multiple Vitamins-Minerals (CENTRUM PO) Take 1 tablet by mouth every evening.     pantoprazole (PROTONIX) 40 MG tablet TAKE 1 TABLET BY MOUTH ONCE DAILY 1/2 HOUR BEFORE A  MEAL  90 tablet 3   polyethylene glycol (MIRALAX / GLYCOLAX) 17 g packet Take 17 g by mouth daily. 14 each 0   Tiotropium Bromide-Olodaterol (STIOLTO RESPIMAT) 2.5-2.5 MCG/ACT AERS Inhale 2 puffs into the lungs daily. 4 g 0   No current facility-administered medications for this encounter.   Facility-Administered Medications Ordered in Other Encounters  Medication Dose Route Frequency Provider Last Rate Last Admin   0.9 %  sodium chloride infusion  250 mL Intravenous PRN Gold, Wayne E, PA-C       levalbuterol (XOPENEX) nebulizer solution 0.63 mg  0.63 mg Nebulization Q6H PRN Gaye Pollack, MD   0.63 mg at 09/29/17 1646   sodium chloride flush (NS) 0.9 % injection 3 mL  3 mL Intravenous Q12H Gold, Wayne E, PA-C       sodium chloride flush (NS) 0.9 % injection 3 mL  3 mL Intravenous Q12H Gold, Wayne E, PA-C       sodium chloride flush (NS) 0.9 % injection 3 mL  3 mL Intravenous PRN [name], PA-C        Physical Findings: The patient is in no acute distress. Patient is alert and oriented. Wt Readings from Last 3 Encounters:  09/19/22 168 lb 4 oz (76.3 kg)  08/15/22 159 lb (72.1 kg)  08/10/22 159 lb 12.8 oz (72.5 kg)    height is 5\' 9"  (1.753 m) and weight is 168 lb 4 oz (76.3 kg). His temporal temperature is 96.5 F (35.8 C) (abnormal). His blood pressure is 137/67 and his pulse is 50 (abnormal). His respiration is 18 and oxygen saturation is 100%. .  General: Alert and oriented, in no acute distress HEENT: Head is normocephalic. Extraocular movements are intact. Oropharynx is notable for no lesions in upper throat.  He is very hard of hearing;  mucosa is moist.  No thrush Neck: Neck is notable for no palpable adenopathy Skin: Skin in treatment fields shows satisfactory healing over neck Heart: Regular in rhythm with soft systolic murmur; no rubs, or gallops. Chest: Lungs CTA in all fields. Extremities: No cyanosis or edema. Lymphatics: see Neck Exam Psychiatric: Judgment and insight are intact. Affect is appropriate.   Lab Findings: Lab Results  Component Value Date   WBC 9.3 04/24/2022   HGB 11.6 (L) 04/24/2022   HCT 33.9 (L) 04/24/2022   MCV 92.6 04/24/2022   PLT 450 (H) 04/24/2022    Lab Results  Component Value Date   TSH 3.030 04/24/2022    Radiographic Findings: NM PET Image Restag (PS) Skull Base To Thigh  Result Date: 09/13/2022 CLINICAL DATA:  Subsequent treatment strategy for head and neck cancer. EXAM: NUCLEAR MEDICINE PET SKULL BASE TO THIGH TECHNIQUE: 8.5 mCi F-18 FDG was injected intravenously. Full-ring PET imaging was performed from  the skull base to thigh after the radiotracer. CT data was obtained and used for attenuation correction and anatomic localization. Fasting blood glucose: 89 mg/dl COMPARISON:  April 13, 2022 FINDINGS: Mediastinal blood pool activity: SUV max 2.38 Liver activity: SUV max NA NECK: Soft tissue thickening in the retropharyngeal region just above the vocal cords in a red noise with a maximum SUV of 5.7 as compared to 15.49. No discrete nodal enlargement in the neck. Blurring of tissue planes in the area of previous hypermetabolic lymph node at level II/III without residual increased metabolic activity. Maximum SUV in this area 2.33 is similar to mediastinal blood pool. Incidental CT findings: None. CHEST: LEFT juxta hilar airspace process with bandlike changes  that extend across the medial upper lobe with a maximum SUV of 4.23 as compared to 4.57 on the prior study. Subcarinal nodal tissue (image 78/3) maximum SUV 2.95 as compared to 3.93. 8 mm greatest thickness in this area is slightly diminished from 10 mm seen previously. No adenopathy by size criteria in the chest. No focal, suspicious pulmonary nodule. Incidental CT findings: Changes of partial lung resection in the LEFT chest and related scarring as outlined above. Pulmonary emphysema. Aortic atherosclerosis. No pericardial effusion. No nodularity of the pericardium. Stable subtle nodularity with ground-glass characteristics in the RIGHT lower lobe along the major fissure (image 36/7 6 mm. ABDOMEN/PELVIS: No abnormal hypermetabolic activity within the liver, pancreas, adrenal glands, or spleen. No hypermetabolic lymph nodes in the abdomen or pelvis. Incidental CT findings: No acute findings. Aortic atherosclerotic changes. No aneurysmal dilation of the abdominal aorta. Normal appendix. No adenopathy by size criteria in the abdomen or in the pelvis. SKELETON: No focal hypermetabolic activity to suggest skeletal metastasis. 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 IMPRESSION: Marked interval response to therapy. Moderate about the hypopharynx/retropharyngeal area is greatly diminished compared to previous imaging. Indistinct appearance of the area of nodal disease in the LEFT neck seen on previous imaging without metabolic activity above mediastinal blood pool activity currently also indicating response to therapy. Stable postoperative changes in reactive lymph nodes in the chest. No signs of disease in the abdomen or in the pelvis. Electronically Signed   By: Zetta Bills M.D.   On: 09/13/2022 16:58   DG INJECT DIAG/THERA/INC NEEDLE/CATH/PLC EPI/LUMB/SAC W/IMG  Result Date: 08/31/2022 CLINICAL DATA:  Low back and left lower extremity pain. Chronic L5-S1 fusion. L5 radiculopathy. Patient reports 75% improvement of symptoms after previous caudal injection, particularly in the right hip. Left lower extremity symptoms persist. FLUOROSCOPY: Radiation Exposure Index (as provided by the fluoroscopic device): Dose area product 77.25 uGy*m2 EXAM: CAUDAL EPIDURAL INJECTION Attempt at the left L5-S1 transforaminal injection was performed. I was unable to access the foramen due to heterotopic calcification. Utilizing a caudal approach, the skin overlying the sacral hiatus was cleansed and anesthetized. A 22 gauge spinal needle was advanced into the sacral epidural space. Injection of Isovue-M 200 shows a good epidural pattern with spread up to L5-S1. No vascular opacification is seen. 80 mg of Depo-Medrol mixed with 5 ml of normal saline and 2 ml of 1% Lidocaine were instilled. The procedure was well-tolerated, and the patient was discharged 20 minutes following the injection in good condition. IMPRESSION: Technically successful caudal epidural injection #2. Electronically Signed   By: San Morelle M.D.   On: 08/31/2022 14:39    Impression/Plan:    1) Head and Neck Cancer  Status: Healing well from radiation therapy. Most recent PET scan is reassuring for no disease recurrence. He is doing very well clinically. Will follow up with Dr. Redmond Baseman for scope (traditionally he has had difficulty tolerating our laryngoscope due to its girth).  Follow up with me with repeat PET scan in 4 months (as there is still some SUV uptake consistent with inflammatory activity but cannot definitively rule out residual disease.  Will follow this closely).  2) Nutritional Status: Continue p.o. intake as tolerated. Gaining weight.  PEG tube: None    Wt Readings from Last 3 Encounters:  09/19/22 168 lb 4 oz (76.3 kg)  08/15/22 159 lb (72.1 kg)  08/10/22 159 lb 12.8 oz (72.5  kg)     3) Risk Factors: The patient has been educated about risk factors including alcohol and tobacco abuse; they understand that avoidance of alcohol and tobacco is important to prevent recurrences as well as other cancers  4) Swallowing: Continue following the instructions of speech-language pathology.   5) Dental: Encouraged to continue regular followup with dentistry, and dental hygiene including fluoride rinses.  He declined follow-up with our dentistry at the cancer center due to cost concerns.  I wrote instructions on a sheet of paper at his previous appointment for the patient to follow regarding dental care.  He should follow-up with his community dentist every 4 months, ideally, and floss daily.  Brush twice daily with fluoride toothpaste.  I also recommended he use a fluoride mouthwash without alcohol before he goes to bed.  I advised that if any dental extractions are recommended in the future, his dentist needs to contact me so they have access to his radiation history  6) Thyroid function: Check annually, scheduled for TSH in April.  Lab Results  Component Value Date   TSH 3.030 04/24/2022    7)  Follow-up with Dr. Redmond Baseman for scope in late Dec or January. Follow up with me in 4 months following  scheduled PET scan.   On date of service, in total, I spent 25 minutes on this encounter. Patient was seen in person.  Note was signed a day after encounter, minutes pertaining to date of service only _____________________________________   Leona Singleton, PA    Eppie Gibson, MD

## 2022-09-19 NOTE — Progress Notes (Signed)
Provided one complimentary case of ensure HP.

## 2022-09-22 NOTE — Progress Notes (Signed)
Oncology Nurse Navigator Documentation   At Dr. Pearlie Oyster request I have scheduled Mr. Letizia with Dr. Redmond Baseman at his first available follow up appointment which is 11/01/22 at 8:20. I have called Mr. Caswell daughter in law, Sharyn Lull and informed her of the appointment. She is aware and knows to call if she needs to reschedule. She has my direct contact information to call for any questions or concerns.   Harlow Asa RN, BSN, OCN Head & Neck Oncology Nurse [place] at [facility] Phone # [phone]  Fax # [phone]

## 2022-09-28 NOTE — Progress Notes (Signed)
I, Nathaniel Long, LAT, ATC acting as a scribe for Nathaniel Leader, MD.  Nathaniel Long is a 81 y.o. male who presents to [place] at [facility] today for leg numbness. 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  L-spine MRI from 2018 does show compression at L5. Pt was previously seen by Dr. Georgina Snell on 08/15/22 for L foot drop. Pt's last ESI was on 08/31/22. Today, pt c/o numbness along the lateral aspect of his L lower leg. Pt reports he is walking much better w/ the AFO.  Dx imaging: 07/24/2022 L-spine MRI             07/18/2022 L-spine & left foot x-ray 06/15/17 L-spine MRI   Pertinent review of systems: No fevers or chills  Relevant historical information: Lung cancer and pharyngeal cancer responding to treatment with reducing signal on PET scan. Hard of hearing.   Exam:  BP 130/82   Pulse (!) 53   Ht 5\' 9"  (1.753 m)   Wt 170 lb (77.1 kg)   SpO2 97%   BMI 25.10 kg/m  General: Well Developed, well nourished, and in no acute distress.   MSK: L-spine nontender midline normal lumbar motion. Persistent left foot drop and weakness to the foot dorsiflexion.  Diminished sensation to light touch dorsal left foot and into the lateral left calf.    Lab and Radiology Results  EXAM: MRI LUMBAR SPINE WITHOUT CONTRAST   TECHNIQUE: Multiplanar, multisequence MR imaging of the lumbar spine was performed. No intravenous contrast was administered.   COMPARISON:  MRI of the lumbar spine April 10, 2018.   FINDINGS: Segmentation:  Standard.   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 Trace retrolisthesis of L2 over L3.   Vertebrae: No acute fracture, evidence of discitis, or bone lesion. Chronic compression fracture of the superior endplate of L3 with approximately 50% height loss anteriorly.   Conus medullaris and cauda equina: Conus extends to the  L1-2 level. Conus and cauda equina appear normal.   Paraspinal and other soft tissues: Fatty atrophy of the lower lumbosacral paraspinal musculature.   Disc levels:   T12-L1: No spinal canal or neural foraminal stenosis.   L1-2: Left asymmetric disc bulge and mild hypertrophic facet degenerative changes resulting in mild narrowing of the left subarticular zone and mild-to-moderate left neural foraminal narrowing. Findings have progressed since prior MRI.   L2-3: Loss of disc height, disc bulge with associated osteophytic component, moderate hypertrophic facet degenerative changes and ligamentum flavum redundancy. Interval resolution of focal disc protrusion. Findings result in mild spinal canal stenosis with moderate narrowing of the bilateral subarticular zones, greater on the left, and moderate bilateral neural foraminal narrowing. The degree of neural foraminal stenosis has progressed compared to prior MRI of while the thecal sac appear slightly more patent.   L3-4: Shallow disc bulge and moderate hypertrophic facet degenerative changes without significant spinal canal or neural foraminal stenosis.   L4-5: A shallow disc bulge and moderate hypertrophic facet degenerative changes without significant spinal canal or neural foraminal stenosis.   L5-S1: Anterolisthesis status post solid fusion. No significant spinal canal stenosis. Chronic bone a foraminal stenosis which appear progressed on the right, now severe (series 2 and 3, image 4). Foraminal stenosis moderate on the left and appears stable.   IMPRESSION: 1. Mild narrowing of the left subarticular zone and mild-to-moderate left neural foraminal narrowing at L1-2. 2.  Mild spinal canal stenosis with moderate narrowing of the bilateral subarticular zones and moderate bilateral neural foraminal narrowing at L2-3. 3. 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 Severe right and moderate left neural  foraminal stenosis at this level.     Electronically Signed   By: Pedro Earls M.D.   On: 07/26/2022 09:14   I, Nathaniel Long, personally (independently) visualized and performed the interpretation of the images attached in this note.     Assessment and Plan: 81 y.o. male with left foot drop associated with paresthesias and numbness into the L5 dermatomal pattern left leg.  He has responded quite well to an AFO device which has improved his gait and reduced his fall risk.  He milligrams any pain in the leg after epidural steroid injections. His main issue now is a little bit of numbness along the L5 dermatomal pattern. After discussion plan for continued AFO device and watchful waiting.  Check back as needed.    Discussed warning signs or symptoms. Please see discharge instructions. Patient expresses understanding.   The above documentation has been reviewed and is accurate and complete Nathaniel Long, M.D.

## 2022-09-29 ENCOUNTER — Ambulatory Visit (INDEPENDENT_AMBULATORY_CARE_PROVIDER_SITE_OTHER): Payer: Medicare Other | Admitting: Family Medicine

## 2022-09-29 VITALS — BP 130/82 | HR 53 | Ht 69.0 in | Wt 170.0 lb

## 2022-09-29 DIAGNOSIS — M21372 Foot drop, left foot: Secondary | ICD-10-CM

## 2022-09-29 NOTE — Patient Instructions (Signed)
Thank you for coming in today.   Return as needed.    I can re do the back injection if needed.   I can do the hip injeciton

## 2022-10-12 ENCOUNTER — Other Ambulatory Visit: Payer: Self-pay

## 2022-11-01 ENCOUNTER — Encounter: Payer: Self-pay | Admitting: Internal Medicine

## 2022-11-08 ENCOUNTER — Ambulatory Visit: Payer: Medicare Other

## 2022-11-20 NOTE — Progress Notes (Unsigned)
Nutrition  Complimentary case of ensure HP given to patient today.  Haytham Maher B. Zenia Resides, [place], [place] Registered Dietitian [phone]

## 2022-11-23 NOTE — Progress Notes (Signed)
I, Josepha Pigg, CMA acting as a scribe for [name], MD.  Nathaniel Long is a 82 y.o. male who presents to [place] at [facility] today for neck pain.  Patient was previously seen by Dr. Georgina Snell on 09/29/2022 for left foot drop.  Today, patient reports neck pain after falling asleep in a chair.  Patient locates pain to bilateral neck, R>L. Left-sided sx have improved since onset but pt continues to have right-sided neck pain. Denies radicular sx. Notes decreased ROM in the neck as well as pain with ROM. Has tried IBU, Tylenol and muscle relaxer that PCP sent in.   Radiates: no UE numbness/tingling: no UE weakness: no Aggravates: ROM Treatments tried: Tylenol, IBU, Tizanidine   Pertinent review of systems: No fevers or chills  Relevant historical information: Renal artery stenosis.  Cancer of the posterior pharynx.  Lung cancer. Hard of hearing  Exam:  BP (!) 142/84   Pulse (!) 58   Ht '5\' 9"'$  (1.753 m)   Wt 159 lb (72.1 kg)   SpO2 97%   BMI 23.48 kg/m  General: Well Developed, well nourished, and in no acute distress.   MSK: C-spine: Normal appearing Nontender palpation spinal midline.  Tender palpation right cervical paraspinal musculature and right trapezius.  Decreased cervical motion.  Upper extremity strength is intact.    Lab and Radiology Results  X-ray images C-spine obtained today personally and independently interpreted Multilevel cervical DDD.   Await formal radiology review     Assessment and Plan: 82 y.o. male with acute exacerbation of chronic neck pain.  The pain is located more laterally at the trapezius muscle group.  Pain is thought to be due to muscle spasm and dysfunction.  He does have considerable degenerative changes on his cervical spine x-ray which may be a factor.  Plan for physical therapy trial.  Refill tizanidine and tramadol for pain control.  Recommend heating pad.  Recheck in a month.  PDMP reviewed during this  encounter. Orders Placed This Encounter  Procedures   DG Cervical Spine 2 or 3 views    Standing Status:   Future    Number of Occurrences:   1    Standing Expiration Date:   11/25/2023    Order Specific Question:   Reason for Exam (SYMPTOM  OR DIAGNOSIS REQUIRED)    Answer:   eval neck pain    Order Specific Question:   Preferred imaging location?    Answer:   [facility]   Ambulatory referral to Physical Therapy    Referral Priority:   Routine    Referral Type:   Physical Medicine    Referral Reason:   Specialty Services Required    Requested Specialty:   Physical Therapy    Number of Visits Requested:   1   Meds ordered this encounter  Medications   tiZANidine (ZANAFLEX) 2 MG tablet    Sig: Take 1 tablet (2 mg total) by mouth every 8 (eight) hours as needed.    Dispense:  30 tablet    Refill:  3   traMADol (ULTRAM) 50 MG tablet    Sig: Take 1 tablet (50 mg total) by mouth every 8 (eight) hours as needed for severe pain.    Dispense:  15 tablet    Refill:  0     Discussed warning signs or symptoms. Please see discharge instructions. Patient expresses understanding.   The above documentation has been reviewed and is accurate and complete Ellard Artis  Georgina Snell, M.D.

## 2022-11-24 ENCOUNTER — Ambulatory Visit (INDEPENDENT_AMBULATORY_CARE_PROVIDER_SITE_OTHER): Payer: Medicare Other

## 2022-11-24 ENCOUNTER — Encounter: Payer: Self-pay | Admitting: Family Medicine

## 2022-11-24 ENCOUNTER — Ambulatory Visit: Payer: Medicare Other | Admitting: Family Medicine

## 2022-11-24 VITALS — BP 142/84 | HR 58 | Ht 69.0 in | Wt 159.0 lb

## 2022-11-24 DIAGNOSIS — M542 Cervicalgia: Secondary | ICD-10-CM | POA: Diagnosis not present

## 2022-11-24 MED ORDER — TIZANIDINE HCL 2 MG PO TABS: 2.0000 mg | ORAL_TABLET | Freq: Three times a day (TID) | ORAL | 3 refills | Status: DC | PRN

## 2022-11-24 MED ORDER — TRAMADOL HCL 50 MG PO TABS: 50.0000 mg | ORAL_TABLET | Freq: Three times a day (TID) | ORAL | 0 refills | Status: DC | PRN

## 2022-11-24 NOTE — Patient Instructions (Signed)
Thank you for coming in today.   Please get an Xray today before you leave   I've referred you to Physical Therapy.  Let us know if you don't hear from them in one week.   Keep me updated.   Recheck in 1 month especially if not better.

## 2022-11-27 ENCOUNTER — Encounter: Payer: Self-pay | Admitting: Nutrition

## 2022-11-27 NOTE — Progress Notes (Deleted)
Patient reports he does not tolerate chocolate flavor Ensure.  States he is having difficulty keeping his weight on without supplements.  He is requesting 1 complementary case of vanilla flavored Ensure plus HP.  This was provided.

## 2022-11-27 NOTE — Progress Notes (Signed)
Cervical spine x-ray shows arthritis changes.

## 2022-11-27 NOTE — Progress Notes (Signed)
Patient reports he does not tolerate chocolate flavored Ensure.  He is unable to keep his weight stable without supplements.  Patient requesting 1 complementary case of Ensure Plus HP vanilla.  This was provided today.

## 2022-12-05 ENCOUNTER — Ambulatory Visit: Payer: Medicare Other | Admitting: Nutrition

## 2022-12-05 NOTE — Progress Notes (Signed)
Patient was a walk-in appointment.  Patient is requesting 1 complementary case of Ensure Plus HP.  He likes vanilla flavor.  Current weight documented as 155 pounds.  This is decreased from 170 pounds September 29, 2022.  Current weight is consistent with weight documented on October 17 at 155 pounds.  Treatment completed on September 22.  Patient reports he cannot eat anything.  He is surviving on Ensure plus HP supplements.  Educated on importance of trying to increase soft solid foods and other liquids.  Stressed importance of swallowing exercises.  Will send an update to speech pathologist and MD.  Patient was provided with an additional complementary case of Ensure Plus HP.  **Disclaimer: This note was dictated with voice recognition software. Similar sounding words can inadvertently be transcribed and this note may contain transcription errors which may not have been corrected upon publication of note.**

## 2022-12-06 NOTE — Progress Notes (Signed)
Oncology Nurse Navigator Documentation   I called and spoke with Marilynne Halsted, Nathaniel Long' daughter in law. I informed her that due to his report of increased pain and difficulty swallowing Dr. Isidore Moos asked to move up his PET scan to the next available and then come in for follow up with Dr. Isidore Moos for results and Laryngoscopy. I informed her of the date/times for both appointments and she agreed to both as scheduled. He will also see Garald Balding SLP on 3/12 to help with swallowing concerns. Mr. Caracappa and Sharyn Lull know to call me at anytime for any questions or concerns.  Harlow Asa RN, BSN, OCN Head & Neck Oncology Nurse [place] at [facility] Phone # [phone]  Fax # [phone]

## 2022-12-07 ENCOUNTER — Ambulatory Visit (INDEPENDENT_AMBULATORY_CARE_PROVIDER_SITE_OTHER): Payer: Medicare Other | Admitting: Physical Therapy

## 2022-12-07 ENCOUNTER — Encounter: Payer: Self-pay | Admitting: Physical Therapy

## 2022-12-07 DIAGNOSIS — M62838 Other muscle spasm: Secondary | ICD-10-CM | POA: Diagnosis not present

## 2022-12-07 DIAGNOSIS — M542 Cervicalgia: Secondary | ICD-10-CM | POA: Diagnosis not present

## 2022-12-07 NOTE — Therapy (Addendum)
OUTPATIENT PHYSICAL THERAPY UPPER EXTREMITY EVALUATION   Patient Name: Nathaniel Long MRN: [id number] DOB:1941-02-21, 82 y.o., male Today's Date: 12/07/2022  END OF SESSION:  PT End of Session - 12/11/22 1156     Visit Number 1    Number of Visits 12    Date for PT Re-Evaluation 01/18/23    Authorization Type [employer]    PT Start Time 1303    PT Stop Time 1345    PT Time Calculation (min) 42 min    Activity Tolerance Patient limited by pain    Behavior During Therapy WFL for tasks assessed/performed             Past Medical History:  Diagnosis Date   Anxiety    Cancer (HCC)    lung   Carotid artery stenosis    Cataract    Chronic lower back pain    CKD (chronic kidney disease)    Complication of anesthesia    "can't pee when I come out of OR" (07/18/2017)   Contact with chainsaw as cause of accidental injury 07/16/2017   sternal fracture with bilateral anterior fifth and sixth rib fractures /notes 07/16/2017   COPD (chronic obstructive pulmonary disease) (HCC)    Dyspnea    GERD (gastroesophageal reflux disease)    Hard of hearing    High cholesterol    Hypertension    Peripheral vascular disease (HCC)    Thoracic ascending aortic aneurysm (HCC)    Past Surgical History:  Procedure Laterality Date   ABDOMINAL AORTOGRAM W/LOWER EXTREMITY N/A 05/01/2017   Procedure: Abdominal Aortogram w/Lower Extremity;  Surgeon: Yates Decamp, MD;  Location: [facility];  Service: Cardiovascular;  Laterality: N/A;   BACK SURGERY     FINGER REPLANTATION Left    "ring finger"   HEMORRHOID SURGERY     POSTERIOR LUMBAR FUSION  1986   RENAL ANGIOGRAPHY Bilateral 12/18/2017   Procedure: RENAL ANGIOGRAPHY;  Surgeon: Elder Negus, MD;  Location: [facility];  Service: Cardiovascular;  Laterality: Bilateral;   VIDEO ASSISTED THORACOSCOPY (VATS)/ LOBECTOMY Left 09/19/2017   Procedure: LEFT VIDEO ASSISTED THORACOSCOPY (VATS)/ LEFT UPPER LOBECTOMY, LYMPH NODE  DISECTION;  Surgeon: Loreli Slot, MD;  Location: [facility];  Service: Thoracic;  Laterality: Left;   VIDEO BRONCHOSCOPY WITH ENDOBRONCHIAL NAVIGATION N/A 09/05/2017   Procedure: VIDEO BRONCHOSCOPY WITH ENDOBRONCHIAL NAVIGATION;  Surgeon: Leslye Peer, MD;  Location: [facility];  Service: Thoracic;  Laterality: N/A;   VIDEO BRONCHOSCOPY WITH ENDOBRONCHIAL ULTRASOUND N/A 09/05/2017   Procedure: VIDEO BRONCHOSCOPY WITH ENDOBRONCHIAL ULTRASOUND;  Surgeon: Leslye Peer, MD;  Location: [facility];  Service: Thoracic;  Laterality: N/A;   Patient Active Problem List   Diagnosis Date Noted   Encounter for preoperative dental examination 04/17/2022   Teeth missing 04/17/2022   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   Accretions on teeth 04/17/2022   Generalized gingival recession 04/17/2022   Excessive dental attrition 04/17/2022   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   Hematoma of right ankle 10/28/2021   GI bleed 06/18/2020   COVID-19 virus infection 06/18/2020   Abdominal pain 06/18/2020   CKD (chronic kidney disease) stage 3, GFR 30-59 ml/min (HCC) 06/18/2020   COPD (chronic obstructive pulmonary disease) (HCC) 06/18/2020   Hard of hearing 06/18/2020   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   Right renal artery stenosis (HCC) 12/17/2017   Hydropneumothorax 09/28/2017   S/P lobectomy of lung 09/19/2017   Malignant neoplasm of upper lobe of left lung (HCC) 09/13/2017  Pulmonary nodule, left 09/05/2017   Mediastinal lymphadenopathy 09/05/2017   Multiple rib fractures 07/16/2017   Spondylolisthesis, lumbar region 06/18/2017   Right hip pain 05/23/2017   Chronic bilateral low back pain with bilateral sciatica 05/23/2017   Colon cancer screening 01/12/2017   Antiplatelet or antithrombotic long-term use 01/12/2017   PAD (peripheral artery disease) (HCC) 04/13/2011   Murmur, heart 12/08/2010   ACTINIC KERATOSIS, HEAD 07/14/2010   BRUISED ANKLE 07/14/2010   HYPERTENSION, BENIGN  11/19/2009   TOBACCO ABUSE 10/28/2009   EAR PAIN, LEFT 10/28/2009    PCP: [name]  REFERRING PROVIDER: Clementeen Graham   REFERRING DIAG: Clementeen Graham   THERAPY DIAG:  Cervicalgia  Other muscle spasm  Rationale for Evaluation and Treatment: Rehabilitation  ONSET DATE:    SUBJECTIVE:                                                                                                                                                                                      SUBJECTIVE STATEMENT:  Pt had recent procedure where he had to lay with neck in odd position. Then he  also fell asleep in chair with neck to the side. Notes increased pain on R vs L.  Pain up into back of head.  He also has had Throat CA, did have radiation, finished in Sept, He is Going back to speech therapy tues. Daughter in law here with him today, to help answer questions, pt is very hard of hearing.   PERTINENT HISTORY: CA, COPD, CKD, HOH,    PAIN:  Are you having pain? Yes: NPRS scale: up to 10/10 Pain location: Neck,  Pain description: tight, sore Aggravating factors: most/any movement of head Relieving factors: rest  PRECAUTIONS: None  WEIGHT BEARING RESTRICTIONS: No  FALLS:  Has patient fallen in last 6 months? No  PLOF: Independent  PATIENT GOALS: decreased pain in neck.   NEXT MD VISIT:   OBJECTIVE:   DIAGNOSTIC FINDINGS:   PATIENT SURVEYS :   COGNITION: Overall cognitive status: Within functional limits for tasks assessed     SENSATION:   POSTURE: forward head    UPPER EXTREMITY ROM:  Shoulder ROM: mild limitation for flexion Cervical: Flex: mild limitation/pain, Ext: significant limitation,  L Rot: mod limitation, R rotation: mild limitation  UPPER EXTREMITY MMT: Shoulder Flex: 4/5   JOINT MOBILITY TESTING:  Stiffness and moderate/significant limitation in c-spine and t-spine   PALPATION:  Tightness and pain in R>L cervical paraspinal, into R sub occipital, tightness in R  UT.  Hypomobile C-spine .   TODAY'S TREATMENT:  DATE:  12/07/22: Manual: Light STM to R cervical paraspinals, SO and UT, Light ROM for cervical Flexion and rotation  PATIENT EDUCATION: Education details: PT POC, exam findings, HEP Person educated: Patient Education method: Explanation, Demonstration, Tactile cues, Verbal cues, and Handouts Education comprehension: verbalized understanding, returned demonstration, verbal cues required, tactile cues required, and needs further education  HOME EXERCISE PROGRAM: Access Code: [email or web address] URL: [email or web address] Date: 12/07/2022 Prepared by: Sedalia Muta  Exercises - Supine Cervical Rotation AROM on Pillow  - 2 x daily - 1 sets - 10 reps - Seated Cervical Flexion AROM  - 2 x daily - 1 sets - 10 reps  ASSESSMENT:  CLINICAL IMPRESSION: Patient presents with primary complaint of increased pain in R side of neck. He has stiffness in c-spine with limited ROM. He also has muscle tightness and soreness in R cervical paraspinals and UT. Pt educated on optimal seated posture for comfort and support of neck. Pt to benefit from skilled PT to improve deficits and pain.    OBJECTIVE IMPAIRMENTS: decreased activity tolerance, decreased knowledge of use of DME, decreased mobility, decreased ROM, decreased strength, hypomobility, increased muscle spasms, impaired flexibility, improper body mechanics, and pain.   ACTIVITY LIMITATIONS: carrying, lifting, sitting, standing, reach over head, hygiene/grooming, and locomotion level  PARTICIPATION LIMITATIONS: meal prep, cleaning, and community activity  PERSONAL FACTORS:  none  are also affecting patient's functional outcome.   REHAB POTENTIAL: Good  CLINICAL DECISION MAKING: Stable/uncomplicated  EVALUATION COMPLEXITY: Low  GOALS: Goals reviewed with  patient? Yes  SHORT TERM GOALS: Target date: 12/21/2022  Pt to be independent with initial HEP  Goal status: INITIAL   LONG TERM GOALS: Target date: 02/01/2023  Pt to be independent with final HEP   Goal status: INITIAL  2.  Pt to demo ability for optimal seated posture for relief of neck pain.   Goal status: INITIAL  3.  Pt to report decreased pain in neck to 0-3/10 with cervical ROM and ADLs.   Goal status: INITIAL   PLAN: PT FREQUENCY: 1-2x/week  PT DURATION: 8 weeks  PLANNED INTERVENTIONS: Therapeutic exercises, Therapeutic activity, Neuromuscular re-education, Patient/Family education, Self Care, Joint mobilization, Joint manipulation, DME instructions, Dry Needling, Electrical stimulation, Spinal manipulation, Spinal mobilization, Cryotherapy, Moist heat, Taping, Traction, Ultrasound, Ionotophoresis 4mg /ml Dexamethasone, and Manual therapy  PLAN FOR NEXT SESSION:   Sedalia Muta, PT, DPT 12:29 PM  12/11/22  PHYSICAL THERAPY DISCHARGE SUMMARY  Visits from Start of Care: 1 Plan: Patient agrees to discharge.  Patient goals were not met. Patient is being discharged due to  - Pt did not return after last visit/ Evaluation    Sedalia Muta, PT, DPT 1:45 PM  05/03/23

## 2022-12-11 ENCOUNTER — Encounter: Payer: Self-pay | Admitting: Physical Therapy

## 2022-12-12 ENCOUNTER — Ambulatory Visit: Payer: Medicare Other | Attending: Radiation Oncology

## 2022-12-12 DIAGNOSIS — R1313 Dysphagia, pharyngeal phase: Secondary | ICD-10-CM | POA: Diagnosis not present

## 2022-12-12 NOTE — Patient Instructions (Signed)
   Signs of Aspiration Pneumonia   Chest pain/tightness Fever (can be low grade) Cough  With foul-smelling phlegm (sputum) With sputum containing pus or blood With greenish sputum Fatigue  Shortness of breath  Wheezing   **IF YOU HAVE THESE SIGNS, CONTACT YOUR DOCTOR OR GO TO THE EMERGENCY DEPARTMENT OR URGENT CARE AS SOON AS POSSIBLE**     

## 2022-12-12 NOTE — Therapy (Signed)
OUTPATIENT SPEECH LANGUAGE PATHOLOGY SWALLOW TREATMENT/recertification   Patient Name: Nathaniel Long MRN: [id number] DOB:16-Aug-1941, 82 y.o., male Today's Date: 12/12/2022  PCP: [name], MD REFERRING PROVIDER: Eppie Gibson, MD   End of Session - 12/12/22 2321     Visit Number 6    Number of Visits 8    Date for SLP Re-Evaluation 03/12/23    SLP Start Time 0935    SLP Stop Time  1015    SLP Time Calculation (min) 40 min    Activity Tolerance Patient limited by pain               Past Medical History:  Diagnosis Date   Anxiety    Cancer ([place])    lung   Carotid artery stenosis    Cataract    Chronic lower back pain    CKD (chronic kidney disease)    Complication of anesthesia    "can't pee when I come out of OR" (07/18/2017)   Contact with chainsaw as cause of accidental injury 07/16/2017   sternal fracture with bilateral anterior fifth and sixth rib fractures /notes 07/16/2017   COPD (chronic obstructive pulmonary disease) ([place])    Dyspnea    GERD (gastroesophageal reflux disease)    Hard of hearing    High cholesterol    Hypertension    Peripheral vascular disease ([place])    Thoracic ascending aortic aneurysm [facility])    Past Surgical History:  Procedure Laterality Date   ABDOMINAL AORTOGRAM W/LOWER EXTREMITY N/A 05/01/2017   Procedure: Abdominal Aortogram w/Lower Extremity;  Surgeon: Adrian Prows, MD;  Location: [place] [facility];  Service: Cardiovascular;  Laterality: N/A;   BACK SURGERY     FINGER REPLANTATION Left    "ring finger"   HEMORRHOID SURGERY     POSTERIOR LUMBAR FUSION  1986   RENAL ANGIOGRAPHY Bilateral 12/18/2017   Procedure: RENAL ANGIOGRAPHY;  Surgeon: Nigel Mormon, MD;  Location: [place] [facility];  Service: Cardiovascular;  Laterality: Bilateral;   VIDEO ASSISTED THORACOSCOPY (VATS)/ LOBECTOMY Left 09/19/2017   Procedure: LEFT VIDEO ASSISTED THORACOSCOPY (VATS)/ LEFT UPPER LOBECTOMY, LYMPH NODE DISECTION;  Surgeon: Melrose Nakayama, MD;  Location: [place];  Service: Thoracic;  Laterality: Left;   VIDEO BRONCHOSCOPY WITH ENDOBRONCHIAL NAVIGATION N/A 09/05/2017   Procedure: VIDEO BRONCHOSCOPY WITH ENDOBRONCHIAL NAVIGATION;  Surgeon: Collene Gobble, MD;  Location: [place];  Service: Thoracic;  Laterality: N/A;   VIDEO BRONCHOSCOPY WITH ENDOBRONCHIAL ULTRASOUND N/A 09/05/2017   Procedure: VIDEO BRONCHOSCOPY WITH ENDOBRONCHIAL ULTRASOUND;  Surgeon: Collene Gobble, MD;  Location: [place];  Service: Thoracic;  Laterality: N/A;   Patient Active Problem List   Diagnosis Date Noted   Encounter for preoperative dental examination 04/17/2022   Teeth missing 04/17/2022   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   Accretions on teeth 04/17/2022   Generalized gingival recession 04/17/2022   Excessive dental attrition 04/17/2022   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[place]) 03/28/2022   Hematoma of right ankle 10/28/2021   GI bleed 06/18/2020   COVID-19 virus infection 06/18/2020   Abdominal pain 06/18/2020   CKD (chronic kidney disease) stage 3, GFR 30-59 ml/min ([place]) 06/18/2020   COPD (chronic obstructive pulmonary disease) ([place]) 06/18/2020   Hard of hearing 06/18/2020   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   Right renal artery stenosis ([place]) 12/17/2017   Hydropneumothorax 09/28/2017   S/P lobectomy of lung 09/19/2017   Malignant neoplasm of upper lobe of left lung ([place]) 09/13/2017   Pulmonary nodule, left 09/05/2017  Mediastinal lymphadenopathy 09/05/2017   Multiple rib fractures 07/16/2017   Spondylolisthesis, lumbar region 06/18/2017   Right hip pain 05/23/2017   Chronic bilateral low back pain with bilateral sciatica 05/23/2017   Colon cancer screening 01/12/2017   Antiplatelet or antithrombotic long-term use 01/12/2017   PAD (peripheral artery disease) ([place]) 04/13/2011   Murmur, heart 12/08/2010   ACTINIC KERATOSIS, HEAD 07/14/2010   BRUISED ANKLE 07/14/2010   HYPERTENSION, BENIGN 11/19/2009   TOBACCO ABUSE 10/28/2009    EAR PAIN, LEFT 10/28/2009   Speech Therapy Progress Note  Dates of Reporting Period: 05-08-22 to present  Subjective Statement: Pt was seen for 6 sessions over 7 months.  Objective: See below for today's date.  Goal Update: See below.  Plan: See below  Reason Skilled Services are Required: SLP is not assured pt is safe with POs nor does he complete HEP correctly without cues.    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 Redmond Baseman questioning if cancer has returned. Pt accompanied by: self and daughter in law [place]  PERTINENT HISTORY: "DYLIN Long is a 82 y.o. male who presented with the cc of ongoing left ear pain and throat pain to Dr. West Carbo on 02/23/22. Laryngoscopy performed by Dr. West Carbo during that visit revealed a mass on the posterior pharyngeal wall noted as slightly friable in appearance. Normal vocal cord mobility was appreciated without vocal cord nodule, mass, polyp or tumor. Hypopharynx also appeared normal without mass, pooling of secretions or aspiration.CT neck/thyroid w cont 03-07-22 showed:  No evidence of laryngeal mass. Question abnormal appearance of the  upper cervical esophagus, which may show mild thickening with adjacent edema, which could go along with esophagitis. No definite adenopathy. Single slightly prominent level 2 level 3 junction lymph node on the left with short axis dimension of 9-10 mm.  Bilateral carotid artery atherosclerotic disease with ICA stenoses  of 50-70%, not precisely evaluated using this technique. 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 The patient followed up  with Dr. Redmond Baseman on 03/16/22 to discuss treatment options. Options including surgical resection with reconstruction and radiation treatment, vs primary chemoradiation were discussed with the patient. The patient will make a final decision following our consultation today and with medical oncology. Dr. Redmond Baseman also referred the patient to [facility] to discuss surgical management - pt chose radiation. 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 Recent chest CT showed no evidence of local recurrence or metastatic disease. " Pt began radiation on Thursday 05/04/22 - he completed in late September 2023.  PAIN:  Are you having pain? No, unless swallowing then 10/10 after two boluses. If pt swallows anything acidic, pain from this "burns me up" in addition to 10/10 pain. This is reason he refused applesauce today.  FALLS: Has patient fallen in last 6 months?  No  LIVING ENVIRONMENT: Lives with: lives with their spouse Lives in: House/apartment  PLOF:  Level of assistance: Independent with ADLs Employment: Retired   PATIENT GOALS Maintain swallow function  OBJECTIVE:  MBS 07-25-22 Patient presents with an improved pharyngeal and cervical esophageal  swallow as compared to MBS completed pre-radiation treatment on 05/24/2022. He continues to exhibit pharyngeal residuals in vallecular and pyriform sinuses with liquids and solids (increased in amount with puree and regular solids), but residuals clear with subsequent swallows. His primary dysphagia occurs in the cervical esophagus, with barium residuals remaining above UES as well as in cervical esophagus. Subsequent swallows as well as swallows of liquids did aid in transiting barium through UES and cervical esophagus. He exhibited the greatest amount of cervical esophageal retention with regular solids with moderate amount remaining  s/p initial and second swallow. Although mild amount of retrograde movement of barium observed at level of cervical esophagus, once boluses had transited through cervical esophagus, no further retrograde moovement observed. No penetration or aspiration occured at any phase of the swallow. SLP recommended patient continue on current diet but to swallow extra times 2-3 with each bite of solids and to alternate sips and bites to help keep boluses transiting through pharynx and cervical esophagus.  TODAY'S TREATMENT:  12/12/22: Re-assessment. Pt with extremely malodorous breath. His muscle tissue under jaw bilaterally and around thyroid area was not hard or fibrotic upon palpation. Pt has been eating primarily baked fish and drinking Ensure and water (the fish slides right on through," pt stated) and has been losing weight. Today pt took 6 sips water with the last 4 each with 10/10 pain, with "wet" voice on 4/6 of them. Smaller sips did not appear to lessen frequency of "wet" voice. Dub twice indicated to SLP he did have trouble swallowing water despite the "wet" voice heard by SLP. Pt assured SLP twice that no overt s/sx aspiration PNA were present, nor had he had any bronchitis or URI in the last 3 months. He req'd max A to tell SLP 3 overt s/sx aspiration PNA. Handout with these was provided and then pt successful with modified indpendence. Dtr in Set designer (who is an Therapist, sports) was going to assess lung sounds upon return home and report to referring MD if there were any concerning lung sounds present. Pt was reluctant to have MBSS performed at this time so SLP suggested he consider MBSS following PET on 3/25 and consult with Dr. Isidore Moos on 3/27 if clinically indicated at that time. No overt s/sx observed today so SLP assumes if pt does have any aspiration this has not resulted in aspiration PNA to date. Pt with difficulty with accuracy of HEP although he assures SLP he has performed this as directed. SLP told pt to  complete as he is able until next session. Pt will likely not be able to complete Shaker due to rt neck pain.  09/14/22: SLP used speech to text with MS Word to communicate with pt due to severe hearing impairment. SLP reminded pt that last session he was reminded that he needs to perform HEP SEPARATE from eating. Pt cont to report eating "anything". SLP ascertained this meant dys III and some select regular diet items. Pt req'd usual mod-max cues for HEP - which makes SLP suspect pt has not been performing "every day" as pt maintained he has been doing. Pt was modified independent with each exercise by session end.  Today he ate cereal bar and drank water without signs of oral, or overt s/sx of pharyngeal deficits. As he ate, pt told SLP his swallowing function is WNL. SLP told pt he was doing well and it was still important for him to follow up with SLP - he agreed to return in two months.  08/10/22: SLP used speech to text with MS Word to communicate with pt due to severe hearing impairment. Pt had MBS two weeks ago and according to dtr in law has not followed precautions from that assessment. Pt eating, according to dietician note potato chips and tender steak, which are not on dysphagia III diet recommended on MBS. Today pt stated ""whatever I want" when asked what he eats. He req'd mod A occasionally for HEP procedure. SLP omitted Shaker from his HEP to make less exercises for pt in hopes this will encourage him to complete HEP. Again today pt stated he thought he did not have to perform HEP due to the frequency of his POs. SLP ensured pt understood rationale for HEP (which pt acknowledged that he did) and understood procedure with each exercise. SLP wrote out simplified versions of pt's exercises and provided these to him. No overt s/sx of aspiration PNA today reported or observed.  Pt ate dys III (cereal bar) without overt s/sx of oral or pharyngeal deficits. SLP reviewed facets of pt's MBS with him  reinforcing the need to complete HEP and follow precautions from [place].   07/10/22: ST took place with SLP writing text as pt has profound hearing impairment. Pt with wide variety of food items in last 2-3 days however he also has wheezing with inhalation and exhalation. POs resulted in coughing immediately after swallow x2/7 bites of cereal bar, one rather violently. Pt took sips of water from cup. Pt did not follow any of swallow precautions from [place] in August. SLP cued pt to follow precautions and strongly urged pt to do so from this time onward. Due to pt's coughing with POs and not following precautions, SLP recommends follow up MBS. Noted from last session pt without any overt s/sx aspiration during POs. In August MBS, pt showed some sensation to aspiration. SLP reviewed reasoning for SLP suggesting MBS for pt - pt demonstrated understanding. Pt has not been completing HEP as prescribed. "I'm eating all the time and so I figured I didn't need to do them." SLP corrected pt and provided rationale for HEP including possible end point of not completing HEP (hospitalization for PNA). "I don't want a hospital stay," pt stated. SLP provided usual max A for exercises. As below, last session pt's cueing level was less - suggesting that pt has likely not been completing HEP very little since last session.   06/08/22: Pt with thick saliva reportedly thinned/cleared with use of Mucinex since 10-14 days ago. "My swallowing has totally changed," pt stated. Today he ate [place] sandwich, and drank thin liquids without overt s/s of oral difficulty other than prolonged mastication which pt states is what he does to improve pharyngeal transit. Suspected more difficult but manageable pharyngeal transit given fine mastication. DIL Sharyn Lull also agrees pt without overt s/sx aspiration during meals she has seen pt eat in the last ~14 days.  HEP completed today for usual mod A for tongue protrusion with Masako. Pt req'd fading cues  to min A occasionally by sesison end. He was encouraged to complete Masako in front of a mirror for ensuring proper procedure.  SLP also reviewed precautions from [facility] on 05-24-22 and pt stated, "I'm pretty much doing all that." With POs pt not always swallowing additional swallows and so SLP cued pt to do so. After this pt adhered to mulitple swallows.   (05/09/22 - eval) Research states the risk for dysphagia increases due to radiation ttreatment due to a variety of factors,  so SLP educated the pt about the possibility of reduced/limited ability for PO intake during rad tx. SLP also educated pt regarding possible changes to swallowing musculature after rad tx, and why adherence to dysphagia HEP provided today and PO consumption was necessary to inhibit muscle fibrosis following rad tx and to mitigate muscle disuse atrophy. SLP informed pt why this would be detrimental to his swallowing status and to their pulmonary health. Pt demonstrated understanding of these things to SLP. SLP encouraged pt to safely eat and drink as deep into their radiation/chemotherapy as possible to provide the best possible long-term swallowing outcome for pt.    SLP then developed an individualized HEP for pt involving oral and pharyngeal strengthening and ROM and pt was instructed how to perform these exercises, including SLP demonstration. After SLP demonstration, pt return demonstrated each exercise. SLP ensured pt performance was correct prior to educating pt on next exercise. Pt is extremely hard of hearing so SLP used speech to text function on SLP's phone to ensure pt understanding. Pt required SBA to perform HEP. "Dub" was instructed to complete this program 6-7 days/week, at least 2 times a day until 6 months after his last day of rad tx, and then x2 a week after that, indefinitely. Among other modifications for days when pt cannot functionally swallow, SLP also suggested pt cycle through the HEP so the full program of exercises  can be completed instead of fatiguing on one of the swallowing exercises and being unable to perform the other swallowing exercises.    PATIENT EDUCATION: Education details: as above in "today's treatment" Person educated: Patient, daughter in Set designer Education method: Explanation, Speech-to-text, Demonstration, Verbal cues, and Handouts Education comprehension: verbalized understanding, returned demonstration, verbal cues required, and needs further education   ASSESSMENT:  CLINICAL IMPRESSION: Patient is a 82 y.o. male who was seen today for treatment for swallowing having completed radiation therapy. Pt reports he has performed HEP as directed however req'd mod-max A for each exercise other than Masako, which req'd min A initially. SLP explained rationale (again) of completion of HEP for pt. Today pt drank thin liquids, refusing solids due to odynophagia. Aspiration precautions from [facility] 07/25/22 were not followed however SLP unsure of how effective they would be at this time given pt's pain level. At this time pt swallowing with liqiuds is deemed [facility]. Data indicate that pt's swallow ability will likely decrease even more over the course of radiation therapy and could very well decline over time following the conclusion of that therapy due to muscle disuse atrophy and/or muscle fibrosis. Pt will cont to need to be seen by SLP in order to assess safety of PO intake, assess the need for recommending any objective swallow assessment, and ensuring pt is correctly completing the individualized HEP. SLP added LTG for pt telling SLP overt s/sx aspiration PNA.   OBJECTIVE IMPAIRMENTS include dysphagia. These impairments are limiting patient from safety when swallowing. Factors affecting potential to achieve goals and functional outcome are pre-radiation swallowing difficulty . Patient will benefit from skilled SLP services to address above impairments and improve overall function.   REHAB POTENTIAL:  Good   GOALS: Goals reviewed with patient? No  SHORT TERM GOALS: Target date:  2 sessions (3rd total session)     pt will complete HEP with rare min A  Baseline: Goal status: Not met   2.  pt will tell SLP why pt is completing HEP with modified independence Baseline:  Goal status: Not met  3.  pt will describe 3 overt s/s aspiration PNA with modified independence Baseline:  Goal status: Met   4.  pt will tell SLP how a food journal could hasten return to a more normalized diet Baseline:  Goal status: Deferred due to pt eating his desired diet   LONG TERM GOALS: Target date:  7th session   (8th total session)  pt will complete HEP with modified independnence in 3 sessions Baseline:  Goal status: Ongoing   2.  pt will describe how to modify HEP over time, and the timeline associated with reduction in HEP frequency with modified independence over two sessions Baseline:  Goal status: Ongoing  3.   Pt will tell SLP rationale for completing HEP   Baseline:   Goal status: Met   4.  pt will describe 3 overt s/s aspiration PNA with modified independence in 2 sessions Baseline: 12/12/22 Goal status: Initial  PLAN: SLP FREQUENCY: approx 1x/month.  SLP DURATION:  8 sessions total  PLANNED INTERVENTIONS: Aspiration precaution training, Pharyngeal strengthening exercises, Diet toleration management , Environmental controls, Trials of upgraded texture/liquids, Cueing hierachy, Internal/external aids, Oral motor exercises, SLP instruction and feedback, Compensatory strategies, Patient/family education, and MBSS    [place], [place] 12/12/2022, 11:22 PM

## 2022-12-13 NOTE — Progress Notes (Signed)
Oncology Nurse Navigator Documentation   At Dr. Pearlie Oyster request I have called Dr. Redmond Baseman' office at [facility]. I've left a VM with his assistant requesting an appointment for Mr. Godette after his scheduled PET scan on 3/25 to evaluate for possible cancer recurrence. I provided my direct number to call me if they had any questions.  Harlow Asa RN, BSN, OCN Head & Neck Oncology Nurse [place] at [facility] Phone # [phone]  Fax # [phone]

## 2022-12-13 NOTE — Progress Notes (Signed)
Mr. Treiber presents today for follow up for completion of radiation therapy to his pharynx. He completed treatment on 06-23-22.   Pain issues, if any: Neck 8/10 Using a feeding tube?: No Weight changes, if any: Roughly 15 lbs Swallowing issues, if any: Yes Smoking or chewing tobacco? None Using fluoride trays daily? No Last ENT visit was on: 10/2022 Other notable issues, if any: Oral irritation, managed w/ lidocaine, and occasional salt water/ baking soda rinses.  BP (!) 122/52 (BP Location: Right Arm, Patient Position: Sitting, Cuff Size: Normal)   Pulse 68   Temp 97.6 F (36.4 C) (Temporal)   Resp 20   Ht 5\' 9"  (1.753 m)   Wt 145 lb 12.8 oz (66.1 kg)   HC 92" (233.7 cm)   SpO2 92%   BMI 21.53 kg/m   This concludes the interview.   Leandra Kern, LPN

## 2022-12-14 ENCOUNTER — Other Ambulatory Visit: Payer: Self-pay | Admitting: Family Medicine

## 2022-12-15 NOTE — Telephone Encounter (Signed)
Patient's daughter in law called to follow up on this refill request.  Please advise.

## 2022-12-18 MED ORDER — TRAMADOL HCL 50 MG PO TABS: 50.0000 mg | ORAL_TABLET | Freq: Three times a day (TID) | ORAL | 0 refills | Status: DC | PRN

## 2022-12-18 NOTE — Telephone Encounter (Signed)
Daughter in law called again about refill. Pt pain is bad, he is having a scan on Monday to see if his cancer is worse, she thinks this may be the cause of some of his pain.

## 2022-12-18 NOTE — Telephone Encounter (Signed)
Last OV 11/24/22 Last refill 11/24/22 #15/0  OK to refill?

## 2022-12-22 ENCOUNTER — Ambulatory Visit: Payer: Medicare Other | Admitting: Family Medicine

## 2022-12-25 ENCOUNTER — Encounter (HOSPITAL_COMMUNITY)
Admission: RE | Admit: 2022-12-25 | Discharge: 2022-12-25 | Disposition: A | Discharge status: Home / Self Care | Payer: Medicare Other | Source: Ambulatory Visit | Attending: Radiation Oncology | Admitting: Radiation Oncology

## 2022-12-25 ENCOUNTER — Inpatient Hospital Stay: Payer: Medicare Other | Attending: Internal Medicine | Admitting: Nutrition

## 2022-12-25 DIAGNOSIS — Z85118 Personal history of other malignant neoplasm of bronchus and lung: Secondary | ICD-10-CM | POA: Insufficient documentation

## 2022-12-25 DIAGNOSIS — R1310 Dysphagia, unspecified: Secondary | ICD-10-CM | POA: Diagnosis not present

## 2022-12-25 DIAGNOSIS — M542 Cervicalgia: Secondary | ICD-10-CM | POA: Insufficient documentation

## 2022-12-25 DIAGNOSIS — Z79899 Other long term (current) drug therapy: Secondary | ICD-10-CM | POA: Diagnosis not present

## 2022-12-25 DIAGNOSIS — R070 Pain in throat: Secondary | ICD-10-CM | POA: Diagnosis present

## 2022-12-25 DIAGNOSIS — Z1329 Encounter for screening for other suspected endocrine disorder: Secondary | ICD-10-CM | POA: Insufficient documentation

## 2022-12-25 DIAGNOSIS — C140 Malignant neoplasm of pharynx, unspecified: Secondary | ICD-10-CM | POA: Insufficient documentation

## 2022-12-25 DIAGNOSIS — J432 Centrilobular emphysema: Secondary | ICD-10-CM | POA: Insufficient documentation

## 2022-12-25 DIAGNOSIS — Z7982 Long term (current) use of aspirin: Secondary | ICD-10-CM | POA: Diagnosis not present

## 2022-12-25 LAB — GLUCOSE, CAPILLARY: Glucose-Capillary: 96 mg/dL (ref 70–99)

## 2022-12-25 MED ORDER — FLUDEOXYGLUCOSE F - 18 (FDG) INJECTION
7.9300 | Freq: Once | INTRAVENOUS | Status: AC | PRN
  Administered 2022-12-25: 7.93 via INTRAVENOUS

## 2022-12-25 NOTE — Progress Notes (Signed)
Brief interaction with patient's daughter who presented requesting a case of Ensure Plus. She is very concerned about patient and says he is having continued weight loss. He has pain with swallowing and is not eating much. He has tried a few things but is refusing others. She offered baby food and he ate a few bites. She does not live with the patient and feels frustrated she cannot encourage him more.  I provided one complimentary case of Ensure Plus and coupons. Provided education fact sheets on pureeing foods and recipes for shakes and smoothies. Encouraged smooth moist foods such as soups, mashed potatoes and gravy, and shakes.  Patient has a MD follow up on Wednesday and a RD follow up following. At that time, MD will review scan results. Results may help to determine nutrition recommendations.

## 2022-12-27 ENCOUNTER — Telehealth: Payer: Self-pay

## 2022-12-27 ENCOUNTER — Other Ambulatory Visit: Payer: Self-pay

## 2022-12-27 ENCOUNTER — Telehealth: Payer: Self-pay | Admitting: Medical Oncology

## 2022-12-27 ENCOUNTER — Inpatient Hospital Stay: Payer: Medicare Other | Admitting: Dietician

## 2022-12-27 ENCOUNTER — Encounter: Payer: Self-pay | Admitting: Radiation Oncology

## 2022-12-27 ENCOUNTER — Ambulatory Visit
Admission: RE | Admit: 2022-12-27 | Discharge: 2022-12-27 | Disposition: A | Discharge status: Home / Self Care | Payer: Medicare Other | Source: Ambulatory Visit | Attending: Radiation Oncology | Admitting: Radiation Oncology

## 2022-12-27 VITALS — BP 122/52 | HR 68 | Temp 97.6°F | Resp 20 | Ht 69.0 in | Wt 145.8 lb

## 2022-12-27 DIAGNOSIS — J432 Centrilobular emphysema: Secondary | ICD-10-CM | POA: Insufficient documentation

## 2022-12-27 DIAGNOSIS — Z1329 Encounter for screening for other suspected endocrine disorder: Secondary | ICD-10-CM | POA: Insufficient documentation

## 2022-12-27 DIAGNOSIS — R1310 Dysphagia, unspecified: Secondary | ICD-10-CM | POA: Insufficient documentation

## 2022-12-27 DIAGNOSIS — M542 Cervicalgia: Secondary | ICD-10-CM | POA: Insufficient documentation

## 2022-12-27 DIAGNOSIS — C140 Malignant neoplasm of pharynx, unspecified: Secondary | ICD-10-CM

## 2022-12-27 DIAGNOSIS — Z79899 Other long term (current) drug therapy: Secondary | ICD-10-CM | POA: Insufficient documentation

## 2022-12-27 DIAGNOSIS — Z7982 Long term (current) use of aspirin: Secondary | ICD-10-CM | POA: Insufficient documentation

## 2022-12-27 DIAGNOSIS — Z85118 Personal history of other malignant neoplasm of bronchus and lung: Secondary | ICD-10-CM | POA: Insufficient documentation

## 2022-12-27 MED ORDER — SODIUM CHLORIDE 0.9 % IV SOLN
Freq: Once | INTRAVENOUS | Status: AC
  Filled 2022-12-27: qty 250

## 2022-12-27 MED ORDER — SODIUM CHLORIDE 0.9 % IV SOLN
INTRAVENOUS | Status: DC
  Filled 2022-12-27: qty 250

## 2022-12-27 MED ORDER — SODIUM CHLORIDE 0.9 % IV SOLN
INTRAVENOUS | Status: AC
  Filled 2022-12-27: qty 250

## 2022-12-27 MED ORDER — OXYCODONE HCL 5 MG PO TABS: 5.0000 mg | ORAL_TABLET | ORAL | 0 refills | Status: DC | PRN

## 2022-12-27 NOTE — Progress Notes (Signed)
Orders for fluids placed per Dr. Pearlie Oyster verbal order for IVF three times a week for pt.

## 2022-12-27 NOTE — Telephone Encounter (Signed)
Dtr-in -law requesting to cancel CT chest  on 04/08  and push back his April follow up appointment due to the head and neck cancer being a bigger issue right now.

## 2022-12-27 NOTE — Progress Notes (Signed)
Peg tube ordered per Dr. Isidore Moos request.

## 2022-12-27 NOTE — Progress Notes (Signed)
Patient did not show for nutrition appointment today. RD has availability and will plan to see patient tomorrow (3/28) during infusion.

## 2022-12-27 NOTE — Telephone Encounter (Signed)
RN Anderson Malta called pt daughter to inform her of appointment with Dr. Redmond Baseman on April 8th at 3pm for her father. She mentioned on the phone that her dad had a ct chest that day that she is attempting to get cancelled due to the head and neck cancer being a bigger issue right now. She also stated that she left a message for Dr. Worthy Flank nurse to push back his April follow up appointment due to this new concern for head and neck cancer recurrence. She said she would give it more time to have the Ct cancelled and for Dr. Worthy Flank nurse to contact her tomorrow as well. Rn Anderson Malta will call with infusion times once they are scheduled.

## 2022-12-27 NOTE — Telephone Encounter (Signed)
RN Anderson Malta called pt daughter Sharyn Lull to let her know of plan for labs and IVF's tomorrow based on Dr. Pearlie Oyster recommendations. She was concerned about the appointments being so early but she stated she would encourage him and get him to come. Rn also notified Sharyn Lull of plan for continued labs and fluids three times a week. Rn stated she would work on getting these appointments as late possible to help the pt get rest in the mornings. Family also notified that peg tube order had been placed and IR should be in contact about scheduling this. Sharyn Lull stated she is a [employer] scientist and that she feels the peg tube would be great for her to help him get nutrition at home. She states he feels bad a baseline but feels worse now due to poor nutrition. RN will touch base with them once weekly infusions and labs are scheduled.

## 2022-12-27 NOTE — Progress Notes (Signed)
Radiation Oncology         [phone] ________________________________  Name: Nathaniel Long MRN: [id number]  Date: 12/27/2022  DOB: 02/05/1941  Follow-Up Visit Note  CC: [name], MD  [name], MD  Diagnosis and Prior Radiotherapy:       ICD-10-CM   1. 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HCC)  C14.0 oxyCODONE (OXY IR/ROXICODONE) 5 MG immediate release tablet    2. Screening for hypothyroidism  Z13.29     3. 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 oxyCODONE (OXY IR/ROXICODONE) 5 MG immediate release tablet      Cancer Staging  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HCC) Staging form: Pharynx - P16 Negative Oropharynx, AJCC 8th Edition - Clinical stage from 04/25/2022: Stage III (cT3, cN1, cM0, p16-) - Signed by Eppie Gibson, MD on 04/25/2022 Stage prefix: Initial diagnosis  Malignant neoplasm of upper lobe of left lung [facility]) Staging form: Lung, AJCC 8th Edition - Clinical stage from 09/13/2017: Stage IA2 (cT1b, cN0, cM0) - Unsigned Laterality: Left Tumor size (mm): 16   Radiation Treatment Dates: 05/04/2022 through 06/23/2022 Site Technique Total Dose (Gy) Dose per Fx (Gy) Completed Fx Beam Energies  Neck: HN_pharynx IMRT 70/70 2 35/35 6X    CHIEF COMPLAINT:  Here for follow-up and surveillance of throat cancer  Narrative:   Nathaniel Long presents today for follow up for completion of radiation therapy to his pharynx. He completed treatment on 06-23-22.   Pain issues, if any: Neck 8/10 - He saw Dr Zada Finders of [facility] for this recently and had a injection which was helpful.  He is hopeful he can undergo another injection soon to the contralateral neck.  Etiology of his pain is not entirely clear.  He reports the pain is severe.  It is worse than his throat pain.  However, he also does have throat pain with swallowing.  The patient is not swallowing lidocaine before meals as instructed.  His daughter reports that he is only swishing it around in his mouth  His pain, particularly in his  neck, is so severe that they are considering enrolling on hospice so that he can get stronger pain medication.  He currently takes tramadol and it is unclear whether it is really helping or not.  Using a feeding tube?: No Weight changes, if any: Roughly 15 lbs and taking in very little nutrition Swallowing issues, if any: Yes Smoking or chewing tobacco? None Using fluoride trays daily? No Last ENT visit was on: 10/2022 with concern for local tumor recurrence-I have personally corresponded with Dr. Redmond Baseman Other notable issues, if any: He is extremely hard of hearing and virtually all communication has to be through writing today His daughter, who is a Marine scientist, is here today.  She reports that her father has clearly declined and he is now willing to get a feeding tube which she endorses  ALLERGIES:  is allergic to gabapentin and statins.  Meds: Current Outpatient Medications  Medication Sig Dispense Refill   oxyCODONE (OXY IR/ROXICODONE) 5 MG immediate release tablet Take 1 tablet (5 mg total) by mouth every 4 (four) hours as needed for severe pain. Take with food. 30 tablet 0   ALPRAZolam (XANAX) 0.5 MG tablet Take 1 tablet (0.5 mg total) by mouth at bedtime as needed for anxiety. 90 tablet 1   AMBULATORY NON FORMULARY MEDICATION L AFO for foot drop Dispense 1 Dx code: [id number] Use as needed 1 Units 0   aspirin EC 81 MG tablet Take 1 tablet (81 mg  total) by mouth daily. Swallow whole. 90 tablet 3   calcium carbonate (TUMS - DOSED IN MG ELEMENTAL CALCIUM) 500 MG chewable tablet Chew 1 tablet by mouth daily as needed for indigestion or heartburn.     ezetimibe (ZETIA) 10 MG tablet Take 5 mg by mouth daily.  3   ibuprofen (ADVIL) 400 MG tablet Take 1 tablet (400 mg total) by mouth every 4 (four) hours as needed. Take only as needed for throat/neck pain following radiation, as this medication can affect your kidneys. Taper as your pain improves. Take with food. 60 tablet 0   lidocaine (XYLOCAINE) 2 %  solution Patient: Mix 1part 2% viscous lidocaine, 1part H20. Swish & swallow 27mL of diluted mixture, 62min before meals and at bedtime, up to QID PRN soreness. 150 mL 4   LIVALO 4 MG TABS TAKE 1/2TABLET DAILY 45 tablet 3   Multiple Vitamins-Minerals (CENTRUM PO) Take 1 tablet by mouth every evening.     pantoprazole (PROTONIX) 40 MG tablet TAKE 1 TABLET BY MOUTH ONCE DAILY 1/2 HOUR BEFORE A  MEAL 90 tablet 3   polyethylene glycol (MIRALAX / GLYCOLAX) 17 g packet Take 17 g by mouth daily. 14 each 0   Tiotropium Bromide-Olodaterol (STIOLTO RESPIMAT) 2.5-2.5 MCG/ACT AERS Inhale 2 puffs into the lungs daily. 4 g 0   tiZANidine (ZANAFLEX) 2 MG tablet Take 1 tablet (2 mg total) by mouth every 8 (eight) hours as needed. 30 tablet 3   traMADol (ULTRAM) 50 MG tablet Take 1 tablet (50 mg total) by mouth every 8 (eight) hours as needed for severe pain. 15 tablet 0   No current facility-administered medications for this encounter.   Facility-Administered Medications Ordered in Other Encounters  Medication Dose Route Frequency Provider Last Rate Last Admin   0.9 %  sodium chloride infusion  250 mL Intravenous PRN Gold, Wayne E, PA-C       [START ON 12/28/2022] 0.9 %  sodium chloride infusion   Intravenous Continuous Eppie Gibson, MD       Derrill Memo ON 01/15/2023] 0.9 %  sodium chloride infusion   Intravenous Once Eppie Gibson, MD       Derrill Memo ON 01/17/2023] 0.9 %  sodium chloride infusion   Intravenous Once Eppie Gibson, MD       Derrill Memo ON 01/19/2023] 0.9 %  sodium chloride infusion   Intravenous Once Eppie Gibson, MD       Derrill Memo ON 12/28/2022] 0.9 %  sodium chloride infusion   Intravenous Once Eppie Gibson, MD       Derrill Memo ON 01/01/2023] 0.9 %  sodium chloride infusion   Intravenous Once Eppie Gibson, MD       Derrill Memo ON 01/03/2023] 0.9 %  sodium chloride infusion   Intravenous Once Eppie Gibson, MD       Derrill Memo ON 01/05/2023] 0.9 %  sodium chloride infusion   Intravenous Once Eppie Gibson, MD       Derrill Memo ON  01/08/2023] 0.9 %  sodium chloride infusion   Intravenous Once Eppie Gibson, MD       Derrill Memo ON 01/10/2023] 0.9 %  sodium chloride infusion   Intravenous Once Eppie Gibson, MD       Derrill Memo ON 01/12/2023] 0.9 %  sodium chloride infusion   Intravenous Once Eppie Gibson, MD       Derrill Memo ON ] 0.9 %  sodium chloride infusion   Intravenous Once Eppie Gibson, MD       [START ON 01/24/2023] 0.9 %  sodium chloride  infusion   Intravenous Once Eppie Gibson, MD       Derrill Memo ON 01/26/2023] 0.9 %  sodium chloride infusion   Intravenous Once Eppie Gibson, MD       Derrill Memo ON 01/29/2023] 0.9 %  sodium chloride infusion   Intravenous Once Eppie Gibson, MD       Derrill Memo ON 01/31/2023] 0.9 %  sodium chloride infusion   Intravenous Once Eppie Gibson, MD       Derrill Memo ON 02/02/2023] 0.9 %  sodium chloride infusion   Intravenous Once Eppie Gibson, MD       levalbuterol Penne Lash) nebulizer solution 0.63 mg  0.63 mg Nebulization Q6H PRN Gaye Pollack, MD   0.63 mg at 09/29/17 1646   sodium chloride flush (NS) 0.9 % injection 3 mL  3 mL Intravenous Q12H Gold, Wayne E, PA-C       sodium chloride flush (NS) 0.9 % injection 3 mL  3 mL Intravenous Q12H Gold, Wayne E, PA-C       sodium chloride flush (NS) 0.9 % injection 3 mL  3 mL Intravenous PRN [name], PA-C        Physical Findings: The patient is in no acute distress. Patient is alert and oriented. Wt Readings from Last 3 Encounters:  12/27/22 145 lb 12.8 oz (66.1 kg)  11/24/22 159 lb (72.1 kg)  09/29/22 170 lb (77.1 kg)    height is 5\' 9"  (1.753 m) and weight is 145 lb 12.8 oz (66.1 kg). His temporal temperature is 97.6 F (36.4 C). His blood pressure is 122/52 (abnormal) and his pulse is 68. His respiration is 20 and oxygen saturation is 92%. .  General: Alert and oriented, in no acute distress HEENT: Head is normocephalic. Extraocular movements are intact. Oropharynx is notable for no lesions in upper throat.  He is very hard of hearing  No  thrush Neck: Neck is notable for no palpable adenopathy Skin: Skin is pale     Lab Findings: Lab Results  Component Value Date   WBC 9.3 04/24/2022   HGB 11.6 (L) 04/24/2022   HCT 33.9 (L) 04/24/2022   MCV 92.6 04/24/2022   PLT 450 (H) 04/24/2022    Lab Results  Component Value Date   TSH 3.030 04/24/2022    Radiographic Findings: NM PET Image Restag (PS) Skull Base To Thigh  Result Date: 12/26/2022 CLINICAL DATA:  Subsequent treatment strategy for head and neck cancer. EXAM: NUCLEAR MEDICINE PET SKULL BASE TO THIGH TECHNIQUE: 7.9 mCi F-18 FDG was injected intravenously. Full-ring PET imaging was performed from the skull base to thigh after the radiotracer. CT data was obtained and used for attenuation correction and anatomic localization. Fasting blood glucose: 96 mg/dl COMPARISON:  09/13/2022 FINDINGS: Mediastinal blood pool activity: SUV max 2.8 Liver activity: SUV max NA NECK: Exam is limited by misregistration. There is symmetric homogeneous uptake, likely in the region of the vocal cords possibly related to patient's speaking after contrast injection. There is some debris in the posterior hypopharynx in this region, nonspecific. Activity is superimposed on the posterior hypopharynx, corresponding to the low level retro pharyngeal uptake seen on the previous study. Muscular uptake in the lower neck probably from patient motion. No definite hypermetabolic cervical lymphadenopathy. Incidental CT findings: None. CHEST: Previously identified left juxta hilar bandlike medial left upper lobe opacity is similar with persistent low level FDG uptake ( SUV max = 5.0 compared to 4.2 previously). Low level FDG uptake in a subcarinal node again noted with  SUV max = 2.7 today compared to 3.0 previously. No hypermetabolic hilar or axillary lymphadenopathy. Nonspecific FDG uptake ( SUV max = 2.6) identified a lung the vascular bundle to the posterior right lower lobe. 5 mm right middle lobe pulmonary  nodule seen anteriorly on image 100/4 shows low level FDG accumulation with SUV max = 3.1. 5 mm posterior right middle lobe pulmonary nodule on 99/4 demonstrates SUV max = 3.7 Incidental CT findings: Coronary artery calcification is evident. Aortic valve calcification evident. Ascending thoracic aorta measures up to 4.4 cm diameter. Centrilobular and paraseptal emphysema evident. ABDOMEN/PELVIS: No abnormal hypermetabolic activity within the liver, pancreas, adrenal glands, or spleen. No hypermetabolic lymph nodes in the abdomen or pelvis. Incidental CT findings: There is advanced atherosclerotic calcification of the abdominal aorta without aneurysm. SKELETON: No focal hypermetabolic activity to suggest skeletal metastasis. Incidental CT findings: No worrisome lytic or sclerotic osseous abnormality. Old left-sided rib fractures evident. IMPRESSION: 1. Study limited today by misregistration in the region of the neck and upper chest. Symmetric hypermetabolism is identified in the region of the vocal cords and posterior retropharynx and posterior hypopharynx with some debris visible in the posterior hypopharynx on CT imaging. Findings are nonspecific and may be related to patient vocalization after radiotracer injection. As local recurrence cannot be excluded, direct visualization likely warranted. 2. No evidence for hypermetabolic cervical lymphadenopathy. 3. Two tiny right middle lobe pulmonary nodules with low level FDG accumulation are new in the interval. Potentially infectious/inflammatory, close follow-up recommended as metastatic disease not excluded. 4. Similar low level FDG accumulation in a subcarinal lymph node and bandlike soft tissue in the medial left upper lung. Both findings are likely benign/chronic. Attention on follow-up recommended. 5. No new sites of hypermetabolic disease in the abdomen or pelvis on today's study. 6. Aortic Atherosclerosis (ICD10-I70.0) and Emphysema (ICD10-J43.9). Electronically  Signed   By: Misty Stanley M.D.   On: 12/26/2022 09:58    Impression/Plan:    1) Head and neck cancer status: I personally reviewed his PET images.  Unfortunately the registration of the images is suboptimal and this is a limited study.  There is hypermetabolic activity in his throat that could be indicative of tumor recurrence versus necrotic tissue or treatment effects.  The patient is resistant to undergoing laryngoscopy today and what he really needs is a laryngoscopy and biopsy and otolaryngology regardless of whether I scope him today.  I have corresponded with Dr. Redmond Baseman and explained that getting tissue will help [place] discern whether this is treatment effect and/or necrosis versus recurrent tumor.  If it is recurrent tumor the tissue can allow [place] to perform special studies to determine what type of systemic therapy may help treat his disease.  Referral made to Dr. Redmond Baseman of ENT ASAP for biopsy  2) Pain:  He is in terrible pain in his throat and especially his neck -neck pain is related to positioning after a dermatology procedure (seeing [facility] for that - got a recent injection).  The patient and daughter expressed that he is feeling hopeless and considering hospice enrollment purely to get better pain control.  We will try oxycodone.  Risks of this medication discussed in detail.  His daughter knows to reinforce to take it with food and to take MiraLAX as needed for constipation.   We also discussed the risk of falls.  3) Nutritional status/dysphagia: Having significant trouble swallowing due to either tumor recurrence or treatment effects - he is losing weight.  We will order  a PEG  feeding tube - pt and daughter agreeable. He also needs a BMP and start IVF 3 x a week , starting tomorrow, 1L NS @500cc /hr, until intake stabilizes. nursing will arrange.  Nutritionist saw him today.  4) He is at some risk of hypothyroidism and a TSH has been ordered to take place at his next lab visit.  5) Timing of  follow-up with radiation oncology is pending all of the above.  He does have follow-up in the next couple of weeks with medical oncology.  Consider palliative care referral in the future.     On date of service, in total, I spent 40 minutes on this encounter. Patient was seen in person.    _____________________________________    Eppie Gibson, MD

## 2022-12-28 ENCOUNTER — Inpatient Hospital Stay (HOSPITAL_BASED_OUTPATIENT_CLINIC_OR_DEPARTMENT_OTHER): Payer: Medicare Other | Admitting: Nurse Practitioner

## 2022-12-28 ENCOUNTER — Inpatient Hospital Stay: Payer: Medicare Other

## 2022-12-28 ENCOUNTER — Other Ambulatory Visit: Payer: Self-pay

## 2022-12-28 ENCOUNTER — Other Ambulatory Visit: Payer: Self-pay | Admitting: *Deleted

## 2022-12-28 ENCOUNTER — Encounter: Payer: Self-pay | Admitting: Nurse Practitioner

## 2022-12-28 ENCOUNTER — Inpatient Hospital Stay: Payer: Medicare Other | Admitting: Dietician

## 2022-12-28 ENCOUNTER — Telehealth: Payer: Self-pay | Admitting: Internal Medicine

## 2022-12-28 ENCOUNTER — Ambulatory Visit
Admission: RE | Admit: 2022-12-28 | Discharge: 2022-12-28 | Disposition: A | Discharge status: Home / Self Care | Payer: Medicare Other | Source: Ambulatory Visit | Attending: Radiation Oncology | Admitting: Radiation Oncology

## 2022-12-28 VITALS — BP 125/58 | HR 66 | Resp 18

## 2022-12-28 DIAGNOSIS — R52 Pain, unspecified: Secondary | ICD-10-CM

## 2022-12-28 DIAGNOSIS — R630 Anorexia: Secondary | ICD-10-CM | POA: Diagnosis not present

## 2022-12-28 DIAGNOSIS — R634 Abnormal weight loss: Secondary | ICD-10-CM

## 2022-12-28 DIAGNOSIS — B370 Candidal stomatitis: Secondary | ICD-10-CM

## 2022-12-28 DIAGNOSIS — Z1329 Encounter for screening for other suspected endocrine disorder: Secondary | ICD-10-CM

## 2022-12-28 DIAGNOSIS — Z515 Encounter for palliative care: Secondary | ICD-10-CM

## 2022-12-28 DIAGNOSIS — G893 Neoplasm related pain (acute) (chronic): Secondary | ICD-10-CM | POA: Diagnosis not present

## 2022-12-28 DIAGNOSIS — Z7189 Other specified counseling: Secondary | ICD-10-CM

## 2022-12-28 DIAGNOSIS — C140 Malignant neoplasm of pharynx, unspecified: Secondary | ICD-10-CM

## 2022-12-28 DIAGNOSIS — E860 Dehydration: Secondary | ICD-10-CM

## 2022-12-28 DIAGNOSIS — R530 Neoplastic (malignant) related fatigue: Secondary | ICD-10-CM

## 2022-12-28 DIAGNOSIS — C3490 Malignant neoplasm of unspecified part of unspecified bronchus or lung: Secondary | ICD-10-CM

## 2022-12-28 DIAGNOSIS — K5900 Constipation, unspecified: Secondary | ICD-10-CM

## 2022-12-28 LAB — TSH: TSH: 10.267 u[IU]/mL — ABNORMAL HIGH (ref 0.350–4.500)

## 2022-12-28 LAB — BASIC METABOLIC PANEL - CANCER CENTER ONLY
Anion gap: 9 (ref 5–15)
BUN: 37 mg/dL — ABNORMAL HIGH (ref 8–23)
CO2: 25 mmol/L (ref 22–32)
Calcium: 8.4 mg/dL — ABNORMAL LOW (ref 8.9–10.3)
Chloride: 100 mmol/L (ref 98–111)
Creatinine: 1.37 mg/dL — ABNORMAL HIGH (ref 0.61–1.24)
GFR, Estimated: 52 mL/min — ABNORMAL LOW (ref >60)
Glucose, Bld: 139 mg/dL — ABNORMAL HIGH (ref 70–99)
Potassium: 4.1 mmol/L (ref 3.5–5.1)
Sodium: 134 mmol/L — ABNORMAL LOW (ref 135–145)

## 2022-12-28 MED ORDER — NYSTATIN 100000 UNIT/ML MT SUSP: 10.0000 mL | Freq: Three times a day (TID) | OROMUCOSAL | 1 refills | Status: DC

## 2022-12-28 MED ORDER — SODIUM CHLORIDE 0.9 % IV SOLN: Freq: Once | INTRAVENOUS | Status: AC

## 2022-12-28 MED ORDER — XTAMPZA ER 9 MG PO C12A: 9.0000 mg | EXTENDED_RELEASE_CAPSULE | Freq: Two times a day (BID) | ORAL | 0 refills | Status: DC

## 2022-12-28 MED ORDER — LIDOCAINE 5 % EX PTCH: 1.0000 | MEDICATED_PATCH | CUTANEOUS | 0 refills | Status: DC

## 2022-12-28 MED ORDER — TRAMADOL HCL 50 MG PO TABS: 50.0000 mg | ORAL_TABLET | Freq: Four times a day (QID) | ORAL | 0 refills | Status: DC | PRN

## 2022-12-28 MED ORDER — MELOXICAM 7.5 MG PO TABS: 7.5000 mg | ORAL_TABLET | Freq: Every day | ORAL | 0 refills | Status: DC

## 2022-12-28 NOTE — Progress Notes (Signed)
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 He is receiving supportive IV fluids. Patient is very hard of hearing requiring questions/responses to be written out for him. Sharyn Lull able to provide most of recent history today. Patient in pain during visit. He is tired of sitting and ready to go. Sharyn Lull reports patient is drinking small amounts of gatorade, sips of water with medication, and some Ensure (3-4/day). He will sometimes have a few spoonfuls of soup or baby food.  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

## 2022-12-28 NOTE — Progress Notes (Signed)
Pt reported to infusion today with c/o worsened pain without relief. This RN reached out to Dr. Isidore Moos and Dr. Julien Nordmann. Dr. Julien Nordmann recommended Pt to be seen by [facility]. This RN reached out to [employer] in [facility]. Anda Kraft recommended Pt to be referred to palliative care for pain management. This RN educated Pt and Pt's family regarding palliative care consult. Pt and Pt's family verbalized understanding and were agreeable to have referral placed for palliative care. This RN placed ambulatory referral for palliative care with Penni Homans NP. This RN then notified Lexine Baton NP and [place] RN of referral. Lexine Baton NP assessed Pt chairside in infusion. Pt's family expressed gratitude towards having referral placed and being able to be seen by palliative care today.

## 2022-12-28 NOTE — Telephone Encounter (Signed)
Scheduled per 03/28 scheduled messages, called and spoke with patient's daughter. Patient will be notified.

## 2022-12-28 NOTE — Patient Instructions (Addendum)
-   pick up and use prescription strength lidocaine patches - we will refill tramadol, take 1-2 pills (50-100mg ) every 6 hours as needed for pain level - pick up and take extended release oxycodone and take every 12 hours regardless of pain level - take mirilax every day to ensure a bowel movement everyday - tuck your chin to your chest when swallowing to help your food/drink go down better - swish and swallow your magic mouthwash, it has lidocaine in it  - pick up and continue to take your Protonix, you have refills at the pharmacy

## 2022-12-28 NOTE — Progress Notes (Signed)
[place]  Telephone:([phone]) [phone] Fax:(336) [phone]   Name: Nathaniel Long Date: 12/28/2022 MRN: [id number]  DOB: [birth year]  Patient Care Team: [name], MD as PCP - General (Internal Medicine) [place]elt, Stephani Police, RN as Oncology Nurse Navigator Eppie Gibson, MD as Consulting Physician (Radiation Oncology) Melida Quitter, MD as Consulting Physician (Otolaryngology)    REASON FOR CONSULTATION: Nathaniel Long is a 82 y.o. 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  Palliative ask to see for symptom management in the setting of ongoing throat pain and discomfort.    SOCIAL HISTORY:     reports that he quit smoking about 5 years ago. His smoking use included cigarettes. He has a 33.00 pack-year smoking history. He has never used smokeless tobacco. He reports that he does not currently use alcohol. He reports that he does not use drugs.  ADVANCE DIRECTIVES:  Patient has active advance directive on file identifying his wife as his Education officer, community.  However he states he will need to revise this given her current health situation.  CODE STATUS: DNR  PAST MEDICAL HISTORY: Past Medical History:  Diagnosis Date   Anxiety    Cancer ([place])    lung   Carotid artery stenosis    Cataract    Chronic lower back pain    CKD (chronic kidney disease)    Complication of anesthesia    "can't pee when I come out of OR" (07/18/2017)   Contact with chainsaw as cause of accidental injury 07/16/2017   sternal fracture with bilateral anterior fifth and sixth rib fractures /notes 07/16/2017   COPD (chronic obstructive pulmonary disease) (HCC)    Dyspnea    GERD (gastroesophageal reflux disease)    Hard of hearing    High cholesterol    Hypertension    Peripheral vascular disease ([place])    Thoracic ascending aortic aneurysm ([place])     PAST  SURGICAL HISTORY:  Past Surgical History:  Procedure Laterality Date   ABDOMINAL AORTOGRAM W/LOWER EXTREMITY N/A 05/01/2017   Procedure: Abdominal Aortogram w/Lower Extremity;  Surgeon: Adrian Prows, MD;  Location: [facility];  Service: Cardiovascular;  Laterality: N/A;   BACK SURGERY     FINGER REPLANTATION Left    "ring finger"   HEMORRHOID SURGERY     POSTERIOR LUMBAR FUSION  1986   RENAL ANGIOGRAPHY Bilateral 12/18/2017   Procedure: RENAL ANGIOGRAPHY;  Surgeon: Nigel Mormon, MD;  Location: [facility];  Service: Cardiovascular;  Laterality: Bilateral;   VIDEO ASSISTED THORACOSCOPY (VATS)/ LOBECTOMY Left 09/19/2017   Procedure: LEFT VIDEO ASSISTED THORACOSCOPY (VATS)/ LEFT UPPER LOBECTOMY, LYMPH NODE DISECTION;  Surgeon: Melrose Nakayama, MD;  Location: [facility];  Service: Thoracic;  Laterality: Left;   VIDEO BRONCHOSCOPY WITH ENDOBRONCHIAL NAVIGATION N/A 09/05/2017   Procedure: VIDEO BRONCHOSCOPY WITH ENDOBRONCHIAL NAVIGATION;  Surgeon: Collene Gobble, MD;  Location: [facility];  Service: Thoracic;  Laterality: N/A;   VIDEO BRONCHOSCOPY WITH ENDOBRONCHIAL ULTRASOUND N/A 09/05/2017   Procedure: VIDEO BRONCHOSCOPY WITH ENDOBRONCHIAL ULTRASOUND;  Surgeon: Collene Gobble, MD;  Location: [facility];  Service: Thoracic;  Laterality: N/A;    HEMATOLOGY/ONCOLOGY HISTORY:  Oncology History  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  MEDICATIONS:  Current Outpatient Medications  Medication Sig Dispense Refill   lidocaine (LIDODERM) 5 % Place 1 patch onto the skin daily. Remove & Discard patch within 12 hours or as directed by MD 30  patch 0   magic mouthwash (nystatin, lidocaine, diphenhydrAMINE, alum & mag hydroxide) suspension Swish and swallow 10 mLs 3 (three) times daily. 150 mL 1   oxyCODONE ER (XTAMPZA ER) 9 MG C12A Take 9 mg by mouth every 12 (twelve) hours. 30 capsule 0   ALPRAZolam (XANAX) 0.5 MG tablet Take 1 tablet (0.5 mg total) by mouth at bedtime as needed for anxiety. 90 tablet 1   AMBULATORY NON FORMULARY MEDICATION L AFO for foot drop Dispense 1 Dx code: M21.372 Use as needed 1 Units 0   aspirin EC 81 MG tablet Take 1 tablet (81 mg total) by mouth daily. Swallow whole. 90 tablet 3   calcium carbonate (TUMS - DOSED IN MG ELEMENTAL CALCIUM) 500 MG chewable tablet Chew 1 tablet by mouth daily as needed for indigestion or heartburn.     ezetimibe (ZETIA) 10 MG tablet Take 5 mg by mouth daily.  3   ibuprofen (ADVIL) 400 MG tablet Take 1 tablet (400 mg total) by mouth every 4 (four) hours as needed. Take only as needed for throat/neck pain following radiation, as this medication can affect your kidneys. Taper as your pain improves. Take with food. 60 tablet 0   lidocaine (XYLOCAINE) 2 % solution Patient: Mix 1part 2% viscous lidocaine, 1part H20. Swish & swallow 64mL of diluted mixture, 65min before meals and at bedtime, up to QID PRN soreness. 150 mL 4   LIVALO 4 MG TABS TAKE 1/2TABLET DAILY 45 tablet 3   Multiple Vitamins-Minerals (CENTRUM PO) Take 1 tablet by mouth every evening.     pantoprazole (PROTONIX) 40 MG tablet TAKE 1 TABLET BY MOUTH ONCE DAILY 1/2 HOUR BEFORE A  MEAL 90 tablet 3   polyethylene glycol (MIRALAX / GLYCOLAX) 17 g packet Take 17 g by mouth daily. 14 each 0   Tiotropium Bromide-Olodaterol (STIOLTO RESPIMAT) 2.5-2.5 MCG/ACT AERS Inhale 2 puffs into the lungs daily. 4 g 0   tiZANidine (ZANAFLEX) 2 MG tablet Take 1 tablet (2 mg total) by mouth every 8 (eight) hours as needed. 30 tablet 3   traMADol (ULTRAM) 50 MG tablet Take 1-2 tablets (50-100 mg total) by mouth every 6 (six) hours as needed  for severe pain. 120 tablet 0   No current facility-administered medications for this visit.   Facility-Administered Medications Ordered in Other Visits  Medication Dose Route Frequency Provider Last Rate Last Admin   0.9 %  sodium chloride infusion  250 mL Intravenous PRN Gold, Wayne E, PA-C       0.9 %  sodium chloride infusion   Intravenous Continuous Eppie Gibson, MD       Derrill Memo ON 01/15/2023] 0.9 %  sodium chloride infusion   Intravenous Once Eppie Gibson, MD       Derrill Memo ON 01/17/2023] 0.9 %  sodium chloride infusion   Intravenous Once Eppie Gibson, MD       Derrill Memo ON 01/19/2023] 0.9 %  sodium chloride infusion   Intravenous Once Eppie Gibson, MD       0.9 %  sodium chloride infusion   Intravenous Once Eppie Gibson, MD       [  START ON 01/01/2023] 0.9 %  sodium chloride infusion   Intravenous Once Eppie Gibson, MD       Derrill Memo ON 01/03/2023] 0.9 %  sodium chloride infusion   Intravenous Once Eppie Gibson, MD       Derrill Memo ON 01/05/2023] 0.9 %  sodium chloride infusion   Intravenous Once Eppie Gibson, MD       Derrill Memo ON 01/08/2023] 0.9 %  sodium chloride infusion   Intravenous Once Eppie Gibson, MD       Derrill Memo ON 01/10/2023] 0.9 %  sodium chloride infusion   Intravenous Once Eppie Gibson, MD       Derrill Memo ON 01/12/2023] 0.9 %  sodium chloride infusion   Intravenous Once Eppie Gibson, MD       Derrill Memo ON ] 0.9 %  sodium chloride infusion   Intravenous Once Eppie Gibson, MD       Derrill Memo ON 01/24/2023] 0.9 %  sodium chloride infusion   Intravenous Once Eppie Gibson, MD       Derrill Memo ON 01/26/2023] 0.9 %  sodium chloride infusion   Intravenous Once Eppie Gibson, MD       Derrill Memo ON 01/29/2023] 0.9 %  sodium chloride infusion   Intravenous Once Eppie Gibson, MD       Derrill Memo ON 01/31/2023] 0.9 %  sodium chloride infusion   Intravenous Once Eppie Gibson, MD       Derrill Memo ON 02/02/2023] 0.9 %  sodium chloride infusion   Intravenous Once Eppie Gibson, MD       levalbuterol Penne Lash) nebulizer  solution 0.63 mg  0.63 mg Nebulization Q6H PRN Gaye Pollack, MD   0.63 mg at 09/29/17 1646   sodium chloride flush (NS) 0.9 % injection 3 mL  3 mL Intravenous Q12H Gold, Wayne E, PA-C       sodium chloride flush (NS) 0.9 % injection 3 mL  3 mL Intravenous Q12H Gold, Wayne E, PA-C       sodium chloride flush (NS) 0.9 % injection 3 mL  3 mL Intravenous PRN Gold, Wayne E, PA-C        VITAL SIGNS: There were no vitals taken for this visit. There were no vitals filed for this visit.  Estimated body mass index is 21.53 kg/m as calculated from the following:   Height as of 12/27/22: 5\' 9"  (1.753 m).   Weight as of 12/27/22: 145 lb 12.8 oz (66.1 kg).  LABS: CBC:    Component Value Date/Time   WBC 9.3 04/24/2022 1234   WBC 8.9 07/21/2020 0926   HGB 11.6 (L) 04/24/2022 1234   HCT 33.9 (L) 04/24/2022 1234   PLT 450 (H) 04/24/2022 1234   MCV 92.6 04/24/2022 1234   NEUTROABS 6.1 04/24/2022 1234   LYMPHSABS 2.3 04/24/2022 1234   MONOABS 0.7 04/24/2022 1234   EOSABS 0.2 04/24/2022 1234   BASOSABS 0.1 04/24/2022 1234   Comprehensive Metabolic Panel:    Component Value Date/Time   NA 134 (L) 12/28/2022 0813   K 4.1 12/28/2022 0813   CL 100 12/28/2022 0813   CO2 25 12/28/2022 0813   BUN 37 (H) 12/28/2022 0813   CREATININE 1.37 (H) 12/28/2022 0813   CREATININE 1.17 02/21/2012 1046   GLUCOSE 139 (H) 12/28/2022 0813   CALCIUM 8.4 (L) 12/28/2022 0813   AST 18 04/24/2022 1234   ALT 12 04/24/2022 1234   ALKPHOS 86 04/24/2022 1234   BILITOT 0.7 04/24/2022 1234   PROT 7.2 04/24/2022 1234   ALBUMIN 4.0 04/24/2022 1234  RADIOGRAPHIC STUDIES: NM PET Image Restag (PS) Skull Base To Thigh  Result Date: 12/26/2022 CLINICAL DATA:  Subsequent treatment strategy for head and neck cancer. EXAM: NUCLEAR MEDICINE PET SKULL BASE TO THIGH TECHNIQUE: 7.9 mCi F-18 FDG was injected intravenously. Full-ring PET imaging was performed from the skull base to thigh after the radiotracer. CT data was obtained  and used for attenuation correction and anatomic localization. Fasting blood glucose: 96 mg/dl COMPARISON:  09/13/2022 FINDINGS: Mediastinal blood pool activity: SUV max 2.8 Liver activity: SUV max NA NECK: Exam is limited by misregistration. There is symmetric homogeneous uptake, likely in the region of the vocal cords possibly related to patient's speaking after contrast injection. There is some debris in the posterior hypopharynx in this region, nonspecific. Activity is superimposed on the posterior hypopharynx, corresponding to the low level retro pharyngeal uptake seen on the previous study. Muscular uptake in the lower neck probably from patient motion. No definite hypermetabolic cervical lymphadenopathy. Incidental CT findings: None. CHEST: Previously identified left juxta hilar bandlike medial left upper lobe opacity is similar with persistent low level FDG uptake ( SUV max = 5.0 compared to 4.2 previously). Low level FDG uptake in a subcarinal node again noted with SUV max = 2.7 today compared to 3.0 previously. No hypermetabolic hilar or axillary lymphadenopathy. Nonspecific FDG uptake ( SUV max = 2.6) identified a lung the vascular bundle to the posterior right lower lobe. 5 mm right middle lobe pulmonary nodule seen anteriorly on image 100/4 shows low level FDG accumulation with SUV max = 3.1. 5 mm posterior right middle lobe pulmonary nodule on 99/4 demonstrates SUV max = 3.7 Incidental CT findings: Coronary artery calcification is evident. Aortic valve calcification evident. Ascending thoracic aorta measures up to 4.4 cm diameter. Centrilobular and paraseptal emphysema evident. ABDOMEN/PELVIS: No abnormal hypermetabolic activity within the liver, pancreas, adrenal glands, or spleen. No hypermetabolic lymph nodes in the abdomen or pelvis. Incidental CT findings: There is advanced atherosclerotic calcification of the abdominal aorta without aneurysm. SKELETON: No focal hypermetabolic activity to suggest  skeletal metastasis. Incidental CT findings: No worrisome lytic or sclerotic osseous abnormality. Old left-sided rib fractures evident. IMPRESSION: 1. Study limited today by misregistration in the region of the neck and upper chest. Symmetric hypermetabolism is identified in the region of the vocal cords and posterior retropharynx and posterior hypopharynx with some debris visible in the posterior hypopharynx on CT imaging. Findings are nonspecific and may be related to patient vocalization after radiotracer injection. As local recurrence cannot be excluded, direct visualization likely warranted. 2. No evidence for hypermetabolic cervical lymphadenopathy. 3. Two tiny right middle lobe pulmonary nodules with low level FDG accumulation are new in the interval. Potentially infectious/inflammatory, close follow-up recommended as metastatic disease not excluded. 4. Similar low level FDG accumulation in a subcarinal lymph node and bandlike soft tissue in the medial left upper lung. Both findings are likely benign/chronic. Attention on follow-up recommended. 5. No new sites of hypermetabolic disease in the abdomen or pelvis on today's study. 6. Aortic Atherosclerosis (ICD10-I70.0) and Emphysema (ICD10-J43.9). Electronically Signed   By: Misty Stanley M.D.   On: 12/26/2022 09:58    PERFORMANCE STATUS (ECOG) : 2 - Symptomatic, <50% confined to bed  Review of Systems  Constitutional:  Positive for activity change, appetite change, fatigue and unexpected weight change.  HENT:  Positive for sore throat and trouble swallowing.   Unless otherwise noted, a complete review of systems is negative.  Physical Exam General: sitting in recliner, uncomfortable appearing HEENT: thick  white coat covering tongue Cardiovascular: regular rate and rhythm Pulmonary: normal breathing pattern Abdomen: soft, nontender, + bowel sounds Extremities: no edema, no joint deformities Skin: no rashes Neurological: AAO x4, very hard of  hearing  IMPRESSION: This is my initial visit with Mr. Nikolic.  His daughter-in-law, Glenard Haring is present.  Patient is extremely hard of hearing.  Use of whiteboard to assist with effective communication.  He is alert and oriented able to engage appropriately in discussions.  Patient sitting in recliner receiving IV fluids.  Uncomfortable appearing expressing significant throat pain.  I introduced myself, [place] RN, and Palliative's role in collaboration with the oncology team. Concept of Palliative Care was introduced as specialized medical care for people and their families living with serious illness.  It focuses on providing relief from the symptoms and stress of a serious illness.  The goal is to improve quality of life for both the patient and the family. Values and goals of care important to patient and family were attempted to be elicited.   Mr. Lamke lives in the home with his wife who unfortunately has health concerns of her own.  She is currently under hospice due to end-stage COPD.  His 2 adopted sons also live in the home.  Patient has 3 biological children, 2 of which he has minimal to no contact.  He has 2 stepchildren who are heavily involved in his care and her local.  Patient is ambulatory in the home however spends majority of his time in recliner or bed due to pain and fatigue.  Levada Dy shares patient had a recent fall.  Denies any significant injury.  He does not utilize any devices to assist with ambulation.  Is able to perform most ADLs independently but does require some assistance.  Appetite is poor.  Does tolerate 3-4 protein drinks daily and some soft foods occasionally.  Throat/Neck pain Mr. Liva endorses ongoing neck and throat pain.  Rates his pain 12 out of 10.  He was recently started on oxycodone 5 mg however is complaining of no relief.  Reports having pain for approximately 6-8 weeks. Prior to this he was taking tramadol 50 mg which she feels provided some relief compared to  oxycodone.  He is asking to restart tramadol and discontinue oxycodone.  Also taking tizanidine half tablet for muscle spasms.  Patient describes pain as constant with no relieving factors.  He is applying heat throughout the day and carries with him a portable heating pack/neck massager which can be utilized via USB port.  He complains of throat pain which is hindering him from wanting to eat much. He demonstrates difficulty with extending his head back when swallowing. Education provided on use of chin tuck method which would provide better support with swallowing. He is being followed by Dietician for support also. Patient complains of throat pain which at times feels sharp, stabbing, and burning. He has lidocaine on hand which he recently started back using. We discussed good oral care. On exam he does have thick white coat on tongue. Some sensitivity on exam. Education on use of magic mouthwash for additional support.   Patient reports he is miserable in pain and unfortunately if he cannot gain any relief with then highly consider hospice to help alleviate any of his ongoing suffering.  Emotional support provided.  We extensively discussed his current pain regimen providing potential need to make changes focusing on providing him better relief. Education provided on long-acting pain medication in addition to his breakthrough medications.  He does not wish to continue with oxycodone stating he had a miserable night with no relief and wished he had the tramadol back. I discussed adjusting oxycodone dose however patient and family would like to restart tramadol given patient feels that it worked better. Will also utilize lidocaine patch for support.   All questions answered and support provided.   Constipation Dub is not taking daily stool softeners. Some occasional constipation. Discussed use of daily Miralax mixed in at minimal 4 oz. Glenard Haring feels he can tolerate this better than attempting to take  additional pills.   Goals of care We discussed his current illness and what it means in the larger context of his on-going co-morbidities. Natural disease trajectory and expectations were discussed.  Mr. Persell is clear in his expressed wishes to continue to treat the treatable allowing him every opportunity to continue to thrive for as long as he can.  He would only be interested in pursuing hospice if his symptoms cannot be better controlled and his health continues to deteriorate.  He also was not interested in invasive procedures.  We discussed his poor overall nutrition and plans for PEG tube placement.  Patient and family reports they wish to pursue and this is already been scheduled for April 12.  This would allow him to have nutrition and additional avenue for medication administration.  They are hopeful for improvement in his quality of life.  I discussed the importance of continued conversation with family and their medical providers regarding overall plan of care and treatment options, ensuring decisions are within the context of the patients values and GOCs.  PLAN: Established therapeutic relationship. Education provided on palliative's role in collaboration with their Oncology/Radiation team. Oxycodone ER 9 mg every 12 hours Tramadol 50-100 mg every 6 hours as needed for breakthrough pain Mobic 7.5 mg daily Lidocaine patch to the right shoulder area Magic mouthwash swish and swallow MiraLAX daily Ongoing symptom management and goals of care support as needed. I will plan to see patient back in 2-4 weeks in collaboration to other oncology appointments.  I will plan to follow-up with patient by phone on Monday for close symptom management support.   Patient expressed understanding and was in agreement with this plan. He also understands that He can call the clinic at any time with any questions, concerns, or complaints.   Thank you for your referral and allowing Palliative to  assist in Mr. Kaion Arminio Sian's care.   Number and complexity of problems addressed: HIGH - 1 or more chronic illnesses with SEVERE exacerbation, progression, or side effects of treatment - advanced cancer, pain. Any controlled substances utilized were prescribed in the context of palliative care.  Time Total: 55 min   Visit consisted of counseling and education dealing with the complex and emotionally intense issues of symptom management and palliative care in the setting of serious and potentially life-threatening illness.Greater than 50%  of this time was spent counseling and coordinating care related to the above assessment and plan.  Signed by: Alda Lea, AGPCNP-BC Palliative Medicine Team/[facility]   *Please note that this is a verbal dictation therefore any spelling or grammatical errors are due to the "[place] [employer]" system interpretation.

## 2022-12-28 NOTE — Patient Instructions (Signed)

## 2022-12-29 ENCOUNTER — Encounter: Payer: Self-pay | Admitting: Radiation Oncology

## 2022-12-29 ENCOUNTER — Other Ambulatory Visit: Payer: Self-pay

## 2022-12-29 DIAGNOSIS — C140 Malignant neoplasm of pharynx, unspecified: Secondary | ICD-10-CM | POA: Diagnosis not present

## 2022-12-29 DIAGNOSIS — Z1329 Encounter for screening for other suspected endocrine disorder: Secondary | ICD-10-CM

## 2022-12-29 LAB — T4, FREE: Free T4: 0.94 ng/dL (ref 0.61–1.12)

## 2023-01-01 ENCOUNTER — Inpatient Hospital Stay: Payer: Medicare Other | Admitting: Nurse Practitioner

## 2023-01-01 ENCOUNTER — Ambulatory Visit
Admission: RE | Admit: 2023-01-01 | Discharge: 2023-01-01 | Disposition: A | Discharge status: Home / Self Care | Payer: Medicare Other | Source: Ambulatory Visit | Attending: Radiation Oncology | Admitting: Radiation Oncology

## 2023-01-01 ENCOUNTER — Other Ambulatory Visit: Payer: Self-pay

## 2023-01-01 ENCOUNTER — Inpatient Hospital Stay: Payer: Medicare Other | Attending: Internal Medicine

## 2023-01-01 ENCOUNTER — Other Ambulatory Visit (HOSPITAL_COMMUNITY): Payer: Self-pay

## 2023-01-01 VITALS — BP 115/57 | HR 64 | Temp 98.7°F | Resp 18 | Wt 142.1 lb

## 2023-01-01 DIAGNOSIS — C140 Malignant neoplasm of pharynx, unspecified: Secondary | ICD-10-CM

## 2023-01-01 DIAGNOSIS — B370 Candidal stomatitis: Secondary | ICD-10-CM

## 2023-01-01 DIAGNOSIS — G893 Neoplasm related pain (acute) (chronic): Secondary | ICD-10-CM | POA: Diagnosis not present

## 2023-01-01 DIAGNOSIS — R630 Anorexia: Secondary | ICD-10-CM | POA: Diagnosis not present

## 2023-01-01 DIAGNOSIS — R070 Pain in throat: Secondary | ICD-10-CM | POA: Diagnosis present

## 2023-01-01 DIAGNOSIS — R530 Neoplastic (malignant) related fatigue: Secondary | ICD-10-CM

## 2023-01-01 DIAGNOSIS — Z87891 Personal history of nicotine dependence: Secondary | ICD-10-CM | POA: Diagnosis not present

## 2023-01-01 DIAGNOSIS — K5900 Constipation, unspecified: Secondary | ICD-10-CM

## 2023-01-01 LAB — BASIC METABOLIC PANEL - CANCER CENTER ONLY
Anion gap: 10 (ref 5–15)
BUN: 29 mg/dL — ABNORMAL HIGH (ref 8–23)
CO2: 24 mmol/L (ref 22–32)
Calcium: 8.8 mg/dL — ABNORMAL LOW (ref 8.9–10.3)
Chloride: 100 mmol/L (ref 98–111)
Creatinine: 1.31 mg/dL — ABNORMAL HIGH (ref 0.61–1.24)
GFR, Estimated: 55 mL/min — ABNORMAL LOW (ref >60)
Glucose, Bld: 117 mg/dL — ABNORMAL HIGH (ref 70–99)
Potassium: 4.3 mmol/L (ref 3.5–5.1)
Sodium: 134 mmol/L — ABNORMAL LOW (ref 135–145)

## 2023-01-01 MED ORDER — NYSTATIN 100000 UNIT/ML MT SUSP
10.0000 mL | Freq: Three times a day (TID) | OROMUCOSAL | 1 refills | Status: DC
  Filled 2023-01-01: qty 150, 5d supply, fill #0

## 2023-01-01 MED ORDER — SODIUM CHLORIDE 0.9 % IV SOLN: Freq: Once | INTRAVENOUS | Status: AC

## 2023-01-01 NOTE — Progress Notes (Signed)
Nathaniel Long  [phone] [phone]   Name: Nathaniel Long Date: 01/01/2023 MRN: [id number]  DOB: [birth year]  Patient Care Team: [name], MD as PCP - General (Internal Medicine) Malmfelt, Stephani Police, RN as Oncology Nurse Navigator Eppie Gibson, MD as Consulting Physician (Radiation Oncology) Melida Quitter, MD as Consulting Physician (Otolaryngology)   I connected with Nathaniel Long on 01/01/23 at  1:30 PM EDT by phone and verified that I am speaking with the correct person using two identifiers.   I discussed the limitations, risks, security and privacy concerns of performing an evaluation and management service by telemedicine and the availability of in-person appointments. I also discussed with the patient that there may be a patient responsible charge related to this service. The patient expressed understanding and agreed to proceed.   Other persons participating in the visit and their role in the encounter: [place], RN and Sharyn Lull (Daughter-in-law).    Patient's location: home  Provider's location: [facility]   Chief Complaint: F/U of symptom management   INTERVAL HISTORY: Nathaniel Long is a 82 y.o. 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  Palliative ask to see for symptom management in the setting of ongoing throat pain and discomfort.   SOCIAL HISTORY:     reports that he quit smoking about 5 years ago. His smoking use included cigarettes. He has a 33.00 pack-year smoking history. He has never used smokeless tobacco. He reports that he does not currently use alcohol. He reports that he does not use drugs.  ADVANCE DIRECTIVES:  Documents on file  CODE STATUS: Full code  PAST MEDICAL HISTORY: Past Medical History:  Diagnosis Date   Anxiety    Cancer ([place])    lung   Carotid artery stenosis    Cataract     Chronic lower back pain    CKD (chronic kidney disease)    Complication of anesthesia    "can't pee when I come out of OR" (07/18/2017)   Contact with chainsaw as cause of accidental injury 07/16/2017   sternal fracture with bilateral anterior fifth and sixth rib fractures /notes 07/16/2017   COPD (chronic obstructive pulmonary disease) (HCC)    Dyspnea    GERD (gastroesophageal reflux disease)    Hard of hearing    High cholesterol    Hypertension    Peripheral vascular disease ([place])    Thoracic ascending aortic aneurysm (HCC)     ALLERGIES:  is allergic to gabapentin and statins.  MEDICATIONS:  Current Outpatient Medications  Medication Sig Dispense Refill   ALPRAZolam (XANAX) 0.5 MG tablet Take 1 tablet (0.5 mg total) by mouth at bedtime as needed for anxiety. 90 tablet 1   AMBULATORY NON FORMULARY MEDICATION L AFO for foot drop Dispense 1 Dx code: M21.372 Use as needed 1 Units 0   aspirin EC 81 MG tablet Take 1 tablet (81 mg total) by mouth daily. Swallow whole. 90 tablet 3   calcium carbonate (TUMS - DOSED IN MG ELEMENTAL CALCIUM) 500 MG chewable tablet Chew 1 tablet by mouth daily as needed for indigestion or heartburn.     ezetimibe (ZETIA) 10 MG tablet Take 5 mg by mouth daily.  3   ibuprofen (ADVIL) 400 MG tablet Take 1 tablet (400 mg total) by mouth every 4 (four) hours as needed. Take only as needed for throat/neck pain following radiation, as this medication  can affect your kidneys. Taper as your pain improves. Take with food. 60 tablet 0   lidocaine (LIDODERM) 5 % Place 1 patch onto the skin daily. Remove & Discard patch within 12 hours or as directed by MD 30 patch 0   lidocaine (XYLOCAINE) 2 % solution Patient: Mix 1part 2% viscous lidocaine, 1part H20. Swish & swallow 14mL of diluted mixture, 12min before meals and at bedtime, up to QID PRN soreness. 150 mL 4   LIVALO 4 MG TABS TAKE 1/2TABLET DAILY 45 tablet 3   magic mouthwash (nystatin, lidocaine, diphenhydrAMINE,  alum & mag hydroxide) suspension Swish and swallow 10 mLs 3 (three) times daily. 150 mL 1   meloxicam (MOBIC) 7.5 MG tablet Take 1 tablet (7.5 mg total) by mouth daily. 15 tablet 0   Multiple Vitamins-Minerals (CENTRUM PO) Take 1 tablet by mouth every evening.     oxyCODONE ER (XTAMPZA ER) 9 MG C12A Take 9 mg by mouth every 12 (twelve) hours. 30 capsule 0   pantoprazole (PROTONIX) 40 MG tablet TAKE 1 TABLET BY MOUTH ONCE DAILY 1/2 HOUR BEFORE A  MEAL 90 tablet 3   polyethylene glycol (MIRALAX / GLYCOLAX) 17 g packet Take 17 g by mouth daily. 14 each 0   Tiotropium Bromide-Olodaterol (STIOLTO RESPIMAT) 2.5-2.5 MCG/ACT AERS Inhale 2 puffs into the lungs daily. 4 g 0   tiZANidine (ZANAFLEX) 2 MG tablet Take 1 tablet (2 mg total) by mouth every 8 (eight) hours as needed. 30 tablet 3   traMADol (ULTRAM) 50 MG tablet Take 1-2 tablets (50-100 mg total) by mouth every 6 (six) hours as needed for severe pain. 120 tablet 0   No current facility-administered medications for this visit.   Facility-Administered Medications Ordered in Other Visits  Medication Dose Route Frequency Provider Last Rate Last Admin   0.9 %  sodium chloride infusion  250 mL Intravenous PRN Gold, Wayne E, PA-C       0.9 %  sodium chloride infusion   Intravenous Continuous Eppie Gibson, MD       Derrill Memo ON 01/15/2023] 0.9 %  sodium chloride infusion   Intravenous Once Eppie Gibson, MD       Derrill Memo ON 01/17/2023] 0.9 %  sodium chloride infusion   Intravenous Once Eppie Gibson, MD       Derrill Memo ON 01/19/2023] 0.9 %  sodium chloride infusion   Intravenous Once Eppie Gibson, MD       0.9 %  sodium chloride infusion   Intravenous Once Eppie Gibson, MD       0.9 %  sodium chloride infusion   Intravenous Once Eppie Gibson, MD       Derrill Memo ON 01/03/2023] 0.9 %  sodium chloride infusion   Intravenous Once Eppie Gibson, MD       Derrill Memo ON 01/05/2023] 0.9 %  sodium chloride infusion   Intravenous Once Eppie Gibson, MD       Derrill Memo ON 01/08/2023]  0.9 %  sodium chloride infusion   Intravenous Once Eppie Gibson, MD       Derrill Memo ON 01/10/2023] 0.9 %  sodium chloride infusion   Intravenous Once Eppie Gibson, MD       Derrill Memo ON 01/12/2023] 0.9 %  sodium chloride infusion   Intravenous Once Eppie Gibson, MD       Derrill Memo ON ] 0.9 %  sodium chloride infusion   Intravenous Once Eppie Gibson, MD       [START ON 01/24/2023] 0.9 %  sodium chloride infusion  Intravenous Once Eppie Gibson, MD       Derrill Memo ON 01/26/2023] 0.9 %  sodium chloride infusion   Intravenous Once Eppie Gibson, MD       Derrill Memo ON 01/29/2023] 0.9 %  sodium chloride infusion   Intravenous Once Eppie Gibson, MD       Derrill Memo ON 01/31/2023] 0.9 %  sodium chloride infusion   Intravenous Once Eppie Gibson, MD       Derrill Memo ON 02/02/2023] 0.9 %  sodium chloride infusion   Intravenous Once Eppie Gibson, MD       levalbuterol Penne Lash) nebulizer solution 0.63 mg  0.63 mg Nebulization Q6H PRN Gaye Pollack, MD   0.63 mg at 09/29/17 1646   sodium chloride flush (NS) 0.9 % injection 3 mL  3 mL Intravenous Q12H Gold, Wayne E, PA-C       sodium chloride flush (NS) 0.9 % injection 3 mL  3 mL Intravenous Q12H Gold, Wayne E, PA-C       sodium chloride flush (NS) 0.9 % injection 3 mL  3 mL Intravenous PRN Gold, Wayne E, PA-C        VITAL SIGNS: There were no vitals taken for this visit. There were no vitals filed for this visit.  Estimated body mass index is 21.53 kg/m as calculated from the following:   Height as of 12/27/22: 5\' 9"  (1.753 m).   Weight as of 12/27/22: 145 lb 12.8 oz (66.1 kg).   PERFORMANCE STATUS (ECOG) : 1 - Symptomatic but completely ambulatory   IMPRESSION: I connected with Nathaniel Long and his daughter-in-law, Sharyn Lull by phone. Patient is extremely hard of hearing and unable to speak over the phone independently. No acute distress identified. Sharyn Lull shares patient is doing much better than last week. He and family are much appreciative. Denies nausea,  vomiting, constipation, or diarrhea. He is trying to be as active as possible without overexerting himself. Does feel like he has a little more energy.   Throat/Neck pain Nathaniel Long has reported his pain is controlled on current regimen. Per Sharyn Lull earlier today he reported he had minimal pain.   We reviewed current regimen: Xtampza 9mg  twice daily, Tramadol 50-100 mg every 6 hours as needed for breakthrough pain, tizanidine 1 mg as needed for muscle spasms, Mobic 7.5 mg daily, and lidocaine patch to shoulder area as needed.  Patient is tolerating current regimen without difficulty.  Taking medications as prescribed.  Again family is reporting pain is well-controlled.  He unfortunately has not received his Magic mouthwash from [facility] due to being a compound medication.  New prescription sent to [facility] outpatient pharmacy and family plans to pick up today after infusion visit.   We will continue to closely monitor and adjust medications as needed.  All questions answered and support provided.    Constipation Nathaniel Long is not taking daily stool softeners. Some occasional constipation. Discussed use of daily Miralax mixed in at minimal 4 oz. Nathaniel Long feels he can tolerate this better than attempting to take additional pills.    Goals of care  12/28/22- We discussed his current illness and what it means in the larger context of his on-going co-morbidities. Natural disease trajectory and expectations were discussed.   Nathaniel Long is clear in his expressed wishes to continue to treat the treatable allowing him every opportunity to continue to thrive for as long as he can.  He would only be interested in pursuing hospice if his symptoms cannot be better controlled and his health  continues to deteriorate.  He also was not interested in invasive procedures.   We discussed his poor overall nutrition and plans for PEG tube placement.  Patient and family reports they wish to pursue and this is already been scheduled  for April 12.  This would allow him to have nutrition and additional avenue for medication administration.  They are hopeful for improvement in his quality of life.   I discussed the importance of continued conversation with family and their medical providers regarding overall plan of care and treatment options, ensuring decisions are within the context of the patients values and GOCs.   PLAN:  Oxycodone ER 9 mg every 12 hours (tolerating well) Tramadol 50-100 mg every 6 hours as needed for breakthrough pain Mobic 7.5 mg daily Lidocaine patch to the right shoulder area Magic mouthwash swish and swallow MiraLAX daily Ongoing symptom management and goals of care support as needed. I will plan to see patient back in 2-4 weeks in collaboration to other oncology appointments.  Family knows to contact office if needed sooner.    Patient expressed understanding and was in agreement with this plan. He also understands that He can call the clinic at any time with any questions, concerns, or complaints.   Any controlled substances utilized were prescribed in the context of palliative care. PDMP has been reviewed.    Visit consisted of counseling and education dealing with the complex and emotionally intense issues of symptom management and palliative care in the setting of serious and potentially life-threatening illness.Greater than 50%  of this time was spent counseling and coordinating care related to the above assessment and plan.  Alda Lea, AGPCNP-BC  Palliative Medicine Team/[place]  *Please note that this is a verbal dictation therefore any spelling or grammatical errors are due to the "[place]" system interpretation.

## 2023-01-01 NOTE — Patient Instructions (Signed)

## 2023-01-02 ENCOUNTER — Other Ambulatory Visit: Payer: Self-pay

## 2023-01-02 ENCOUNTER — Other Ambulatory Visit (HOSPITAL_COMMUNITY): Payer: Self-pay

## 2023-01-03 ENCOUNTER — Other Ambulatory Visit: Payer: Self-pay

## 2023-01-03 ENCOUNTER — Inpatient Hospital Stay: Payer: Medicare Other

## 2023-01-03 ENCOUNTER — Ambulatory Visit: Payer: Medicare Other

## 2023-01-03 VITALS — Wt 143.8 lb

## 2023-01-03 DIAGNOSIS — C140 Malignant neoplasm of pharynx, unspecified: Secondary | ICD-10-CM

## 2023-01-03 MED ORDER — SODIUM CHLORIDE 0.9 % IV SOLN: Freq: Once | INTRAVENOUS | Status: AC

## 2023-01-03 NOTE — Patient Instructions (Signed)

## 2023-01-04 ENCOUNTER — Other Ambulatory Visit: Payer: Self-pay

## 2023-01-04 ENCOUNTER — Telehealth: Payer: Self-pay

## 2023-01-04 DIAGNOSIS — Z1329 Encounter for screening for other suspected endocrine disorder: Secondary | ICD-10-CM

## 2023-01-04 NOTE — Telephone Encounter (Signed)
Rn called pt daughter to verify that the pt is not taking biotin. She stated he was not and could barely swallow pills well at times anyway. She also stated she was concerned about this level because she knew someone who had a similar issue with elevated TSH (and normal T4), they ended up having major thyroid issues. She just wants to be sure it is being looked at seriously. I assured her it was. Rn Anderson Malta will call pt back with feedback from Dr. Sondra Come.

## 2023-01-05 ENCOUNTER — Ambulatory Visit: Payer: Medicare Other

## 2023-01-05 ENCOUNTER — Inpatient Hospital Stay: Payer: Medicare Other | Admitting: Dietician

## 2023-01-05 ENCOUNTER — Inpatient Hospital Stay: Payer: Medicare Other

## 2023-01-05 ENCOUNTER — Other Ambulatory Visit: Payer: Self-pay

## 2023-01-05 VITALS — BP 161/64 | HR 80 | Temp 97.8°F | Resp 16

## 2023-01-05 DIAGNOSIS — C140 Malignant neoplasm of pharynx, unspecified: Secondary | ICD-10-CM

## 2023-01-05 MED ORDER — SODIUM CHLORIDE 0.9 % IV SOLN: Freq: Once | INTRAVENOUS | Status: AC

## 2023-01-05 NOTE — Progress Notes (Signed)
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 He is receiving IV fluids today. [name] reports patient has not been drinking much Ensure, maybe 1-2 everyday. Patient recalls eating a cookie and drinking half an Ensure today. Wife of patient making mashed potatoes recently and he will eat those most of the time. [name] reports pt tried Prostat sample and liked it. [name] reports pt is scheduled for biopsy on 4/15 with Dr. Jenne Pane. Patient will likely not feel up to coming to [facility] appts scheduled in the afternoon.    Medications: reviewed   Labs: 4/1 Na 134, BUN 29, Cr 1.31  Anthropometrics: Last wt 143 lb 12 oz on 4/3 decreased   3/27 - 145 lb 12.8 oz    Estimated Energy Needs  Kcals: 2423-5361 Protein: 72-85 Fluid: 1.6 L  NUTRITION DIAGNOSIS: Severe malnutrition continues    INTERVENTION:  Pending PEG on 4/12 Pt at risk for refeeding. Message sent to navigator and scheduling for CMET + Mg/Phos on 4/12 and on 4/17 Will titrate tube feedings as noted below. Written instructions provided. [name] who is a [employer] fundraiser will be providing initial bolus feedings. She is understanding of feeding schedule Additional samples of Prostat provided (each packet provides 100 kcal, 15 g) Continue supportive therapy per MD - IVF 3x/week    4/13-4/14 Give one carton Osmolite 1.5 via PEG BID. Flush tube with 60 ml water before and after. Provide additional 120 ml water flush TID  4/15-4/16 Give 2 and 1/2 cartons Osmolite 1.5 via PEG. Flush tube with 60 ml water before and after. Provide additional 120 ml water flush TID   Goal: Give one carton Osmolite 1.5 via PEG 5x/day. Flush tube with 60 ml water before and after. This provides 1775 kcal, 74.5 g, 905 ml free water (1505 ml total water with flushes) 1200 ml/day meets 100% RDI  MONITORING, EVALUATION, GOAL: weight trends,  intake, PEG, labs   NEXT VISIT: Wednesday April 17 during infusion

## 2023-01-05 NOTE — Progress Notes (Signed)
Oncology Nurse Navigator Documentation   Met with Nathaniel Long and his daughter Nathaniel Long to provide PEG education prior to 01/12/23 placement.   Using  PEG teaching device   and Teach Back, provided education for PEG use and care, including: hand hygiene, gravity bolus administration of daily water flushes and nutritional supplement, fluids and medications; care of tube insertion site including daily dressing change and cleaning; S&S of infection.   Nathaniel Long correctly verbalized procedures for and provided correct return demonstration of gravity administration of water, dressing change and site care as she will be doing his PEG care at home.  I provided written instructions for PEG flushing/dressing change in support of verbal instruction.   I provided/described contents of Start of Care Bolus Feeding Kit (2 Enfit 60 cc syringes, 1 box 4x4 drainage sponges, 1 package mesh briefs, 1 roll paper tape, 1 case Osmolite 1.5). They both voiced understanding he is to start using Osmolite per guidance of Nutrition. They understand I will be available for ongoing PEG support. Provided barium sulfate prep which I obtained from [facility] and reviewed instructions.    Hedda Slade RN, BSN, OCN Head & Neck Oncology Nurse Navigator [facility] at [facility] Phone # [phone]  Fax # [phone]

## 2023-01-05 NOTE — Patient Instructions (Signed)

## 2023-01-08 ENCOUNTER — Ambulatory Visit: Payer: Medicare Other

## 2023-01-08 ENCOUNTER — Other Ambulatory Visit: Payer: Self-pay

## 2023-01-08 ENCOUNTER — Ambulatory Visit
Admission: RE | Admit: 2023-01-08 | Discharge: 2023-01-08 | Disposition: A | Discharge status: Home / Self Care | Payer: Medicare Other | Source: Ambulatory Visit | Attending: Radiation Oncology | Admitting: Radiation Oncology

## 2023-01-08 ENCOUNTER — Inpatient Hospital Stay: Payer: Medicare Other

## 2023-01-08 ENCOUNTER — Ambulatory Visit (HOSPITAL_COMMUNITY): Payer: Medicare Other

## 2023-01-08 VITALS — BP 146/80 | HR 77 | Temp 98.2°F | Resp 18 | Wt 138.7 lb

## 2023-01-08 DIAGNOSIS — C140 Malignant neoplasm of pharynx, unspecified: Secondary | ICD-10-CM

## 2023-01-08 LAB — BASIC METABOLIC PANEL - CANCER CENTER ONLY
Anion gap: 11 (ref 5–15)
BUN: 21 mg/dL (ref 8–23)
CO2: 22 mmol/L (ref 22–32)
Calcium: 8.8 mg/dL — ABNORMAL LOW (ref 8.9–10.3)
Chloride: 100 mmol/L (ref 98–111)
Creatinine: 1.31 mg/dL — ABNORMAL HIGH (ref 0.61–1.24)
GFR, Estimated: 55 mL/min — ABNORMAL LOW (ref >60)
Glucose, Bld: 154 mg/dL — ABNORMAL HIGH (ref 70–99)
Potassium: 3.7 mmol/L (ref 3.5–5.1)
Sodium: 133 mmol/L — ABNORMAL LOW (ref 135–145)

## 2023-01-08 MED ORDER — SODIUM CHLORIDE 0.9 % IV SOLN: Freq: Once | INTRAVENOUS | Status: AC

## 2023-01-08 NOTE — Progress Notes (Signed)
Orders placed for CMP, Mag, and Phos per request of nutrition with future dates of 4-12 and 4-15.

## 2023-01-08 NOTE — Patient Instructions (Signed)
Dehydration, Adult Dehydration is a condition in which there is not enough water or other fluids in the body. This happens when a person loses more fluids than they take in. Important organs cannot work right without the right amount of fluids. Any loss of fluids from the body can cause dehydration. Dehydration can be mild, worse, or very bad. It should be treated right away to keep it from getting very bad. What are the causes? Conditions that cause loss of water in the body. They include: Watery poop (diarrhea). Vomiting. Sweating a lot. Fever. Infection. Peeing (urinating) a lot. Not drinking enough fluids. Certain medicines, such as medicines that take extra fluid out of the body (diuretics). Lack of safe drinking water. Not being able to get enough water and food. What increases the risk? Having a long-term (chronic) illness that has not been treated the right way, such as: Diabetes. Heart disease. Kidney disease. Being 65 years of age or older. Having a disability. Living in a place that is high above the ground or sea (high in altitude). The thinner, drier air causes more fluid loss. Doing exercises that put stress on your body for a long time. Being active when in hot places. What are the signs or symptoms? Symptoms of dehydration depend on how bad it is. Mild or worse dehydration Thirst. Dry lips or dry mouth. Feeling dizzy or light-headed. Muscle cramps. Passing little pee or dark pee. Pee may be the color of tea. Headache. Very bad dehydration Changes in skin. Skin may: Be cold to the touch (clammy). Be blotchy or pale. Not go back to normal right after you pinch it and let it go. Little or no tears, pee, or sweat. Fast breathing. Low blood pressure. Weak pulse. Pulse that is more than 100 beats a minute when you are sitting still. Other changes, such as: Feeling very thirsty. Eyes that look hollow (sunken). Cold hands and feet. Being confused. Being very  tired (lethargic) or having trouble waking from sleep. Losing weight. Loss of consciousness. How is this treated? Treatment for this condition depends on how bad your dehydration is. Treatment should start right away. Do not wait until your condition gets very bad. Very bad dehydration is an emergency. You will need to go to a hospital. Mild or worse dehydration can be treated at home. You may be asked to: Drink more fluids. Drink an oral rehydration solution (ORS). This drink gives you the right amount of fluids, salts, and minerals (electrolytes). Very bad dehydration can be treated: With fluids through an IV tube. By correcting low levels of electrolytes in the body. By treating the problem that caused your dehydration. Follow these instructions at home: Oral rehydration solution If told by your doctor, drink an ORS: Make an ORS. Use instructions on the package. Start by drinking small amounts, about  cup (120 mL) every 5-10 minutes. Slowly drink more until you have had the amount that your doctor said to have.  Eating and drinking  Drink enough clear fluid to keep your pee pale yellow. If you were told to drink an ORS, finish the ORS first. Then, start slowly drinking other clear fluids. Drink fluids such as: Water. Do not drink only water. Doing that can make the salt (sodium) level in your body get too low. Water from ice chips you suck on. Fruit juice that you have added water to (diluted). Low-calorie sports drinks. Eat foods that have the right amounts of salts and minerals, such as bananas, oranges, potatoes,   tomatoes, or spinach. Do not drink alcohol. Avoid drinks that have caffeine or sugar. These include:: High-calorie sports drinks. Fruit juice that you did not add water to. Soda. Coffee or energy drinks. Avoid foods that are greasy or have a lot of fat or sugar. General instructions Take over-the-counter and prescription medicines only as told by your doctor. Do  not take sodium tablets. Doing that can make the salt level in your body get too high. Return to your normal activities as told by your doctor. Ask your doctor what activities are safe for you. Keep all follow-up visits. Your doctor may check and change your treatment. Contact a doctor if: You have pain in your belly (abdomen) and the pain: Gets worse. Stays in one place. You have a rash. You have a stiff neck. You get angry or annoyed more easily than normal. You are more tired or have a harder time waking than normal. You feel weak or dizzy. You feel very thirsty. Get help right away if: You have any symptoms of very bad dehydration. You vomit every time you eat or drink. Your vomiting gets worse, does not go away, or you vomit blood or green stuff. You are getting treatment, but symptoms are getting worse. You have a fever. You have a very bad headache. You have: Diarrhea that gets worse or does not go away. Blood in your poop (stool). This may cause poop to look black and tarry. No pee in 6-8 hours. Only a small amount of pee in 6-8 hours, and the pee is very dark. You have trouble breathing. These symptoms may be an emergency. Get help right away. Call 911. Do not wait to see if the symptoms will go away. Do not drive yourself to the hospital. This information is not intended to replace advice given to you by your health care provider. Make sure you discuss any questions you have with your health care provider. Document Revised: 04/17/2022 Document Reviewed: 04/17/2022 Elsevier Patient Education  2023 [employer]  

## 2023-01-10 ENCOUNTER — Inpatient Hospital Stay: Payer: Medicare Other

## 2023-01-10 VITALS — BP 103/83 | HR 87 | Temp 98.2°F | Resp 17 | Wt 137.5 lb

## 2023-01-10 DIAGNOSIS — C140 Malignant neoplasm of pharynx, unspecified: Secondary | ICD-10-CM | POA: Diagnosis not present

## 2023-01-10 MED ORDER — SODIUM CHLORIDE 0.9 % IV SOLN: Freq: Once | INTRAVENOUS | Status: AC

## 2023-01-11 ENCOUNTER — Inpatient Hospital Stay: Payer: Medicare Other | Admitting: Internal Medicine

## 2023-01-11 ENCOUNTER — Other Ambulatory Visit: Payer: Self-pay | Admitting: Radiology

## 2023-01-11 DIAGNOSIS — C140 Malignant neoplasm of pharynx, unspecified: Secondary | ICD-10-CM

## 2023-01-11 NOTE — H&P (Signed)
Referring Physician(s): Lonie Peak  Supervising Physician: Mir, Mauri Reading  Patient Status:  [facility]  Chief Complaint: "I'm here for a stomach tube"   Subjective: Pt known to IR service from left chest tube placement 2018. 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 He presents now with neck pain, dysphagia, odynophagia , weight loss and malnutrition from either tumor recurrence or treatment effects. He is scheduled today for percutaneous gastrostomy tube placement to assist with nutrition.  He currently denies fever, headache, chest pain, worsening dyspnea, cough, abdominal pain, nausea, vomiting or bleeding.  He is very hard of hearing. Hx constipation.    Past Medical History:  Diagnosis Date   Anxiety    Cancer    lung   Carotid artery stenosis    Cataract    Chronic lower back pain    CKD (chronic kidney disease)    Complication of anesthesia    "can't pee when I come out of OR" (07/18/2017)   Contact with chainsaw as cause of accidental injury 07/16/2017   sternal fracture with bilateral anterior fifth and sixth rib fractures /notes 07/16/2017   COPD (chronic obstructive pulmonary disease)    Dyspnea    GERD (gastroesophageal reflux disease)    Hard of hearing    High cholesterol    Hypertension    Peripheral vascular disease    Thoracic ascending aortic aneurysm    Past Surgical History:  Procedure Laterality Date   ABDOMINAL AORTOGRAM W/LOWER EXTREMITY N/A 05/01/2017   Procedure: Abdominal Aortogram w/Lower Extremity;  Surgeon: Yates Decamp, MD;  Location: [facility];  Service: Cardiovascular;  Laterality: N/A;   BACK SURGERY     FINGER REPLANTATION Left    "ring finger"   HEMORRHOID SURGERY     POSTERIOR LUMBAR FUSION  1986   RENAL ANGIOGRAPHY Bilateral 12/18/2017   Procedure: RENAL ANGIOGRAPHY;  Surgeon: Elder Negus, MD;  Location: [facility];  Service:  Cardiovascular;  Laterality: Bilateral;   VIDEO ASSISTED THORACOSCOPY (VATS)/ LOBECTOMY Left 09/19/2017   Procedure: LEFT VIDEO ASSISTED THORACOSCOPY (VATS)/ LEFT UPPER LOBECTOMY, LYMPH NODE DISECTION;  Surgeon: Loreli Slot, MD;  Location: [facility] OR;  Service: Thoracic;  Laterality: Left;   VIDEO BRONCHOSCOPY WITH ENDOBRONCHIAL NAVIGATION N/A 09/05/2017   Procedure: VIDEO BRONCHOSCOPY WITH ENDOBRONCHIAL NAVIGATION;  Surgeon: Leslye Peer, MD;  Location: [facility];  Service: Thoracic;  Laterality: N/A;   VIDEO BRONCHOSCOPY WITH ENDOBRONCHIAL ULTRASOUND N/A 09/05/2017   Procedure: VIDEO BRONCHOSCOPY WITH ENDOBRONCHIAL ULTRASOUND;  Surgeon: Leslye Peer, MD;  Location: [facility];  Service: Thoracic;  Laterality: N/A;      Allergies: Gabapentin and Statins  Medications: Prior to Admission medications   Medication Sig Start Date End Date Taking? Authorizing Provider  ALPRAZolam Prudy Feeler) 0.5 MG tablet Take 1 tablet (0.5 mg total) by mouth at bedtime as needed for anxiety. 01/17/22   Yates Decamp, MD  AMBULATORY NON FORMULARY MEDICATION L AFO for foot drop Dispense 1 Dx code: Z61.096 Use as needed 07/18/22   Rodolph Bong, MD  aspirin EC 81 MG tablet Take 1 tablet (81 mg total) by mouth daily. Swallow whole. 04/26/21   Cantwell, Celeste C, PA-C  calcium carbonate (TUMS - DOSED IN MG ELEMENTAL CALCIUM) 500 MG chewable tablet Chew 1 tablet by mouth daily as needed for indigestion or heartburn.    [provider]  ezetimibe (ZETIA) 10 MG tablet Take 5 mg  by mouth daily. 08/19/18   [provider]  ibuprofen (ADVIL) 400 MG tablet Take 1 tablet (400 mg total) by mouth every 4 (four) hours as needed. Take only as needed for throat/neck pain following radiation, as this medication can affect your kidneys. Taper as your pain improves. Take with food. 07/05/22   Lonie Peak, MD  lidocaine (LIDODERM) 5 % Place 1 patch onto the skin daily. Remove & Discard patch within 12 hours or as directed  by MD 12/28/22   Pickenpack-Cousar, Arty Baumgartner, NP  lidocaine (XYLOCAINE) 2 % solution Patient: Mix 1part 2% viscous lidocaine, 1part H20. Swish & swallow 10mL of diluted mixture, before meals and at bedtime, up to QID PRN soreness. 05/05/22   Lonie Peak, MD  LIVALO 4 MG TABS TAKE 1/2TABLET DAILY 11/09/21   Cantwell, Celeste C, PA-C  magic mouthwash (nystatin, lidocaine, diphenhydrAMINE, alum & mag hydroxide) suspension Swish and swallow 10 mLs 3 (three) times daily. 01/01/23   Pickenpack-Cousar, Arty Baumgartner, NP  meloxicam (MOBIC) 7.5 MG tablet Take 1 tablet (7.5 mg total) by mouth daily. 12/28/22   Pickenpack-Cousar, Arty Baumgartner, NP  Multiple Vitamins-Minerals (CENTRUM PO) Take 1 tablet by mouth every evening.    [provider]  oxyCODONE ER (XTAMPZA ER) 9 MG C12A Take 9 mg by mouth every 12 (twelve) hours. 12/28/22   Pickenpack-Cousar, Arty Baumgartner, NP  pantoprazole (PROTONIX) 40 MG tablet TAKE 1 TABLET BY MOUTH ONCE DAILY 1/2 HOUR BEFORE A  MEAL 08/09/22   Yates Decamp, MD  polyethylene glycol (MIRALAX / GLYCOLAX) 17 g packet Take 17 g by mouth daily. 06/19/20   Ghimire, Werner Lean, MD  Tiotropium Bromide-Olodaterol (STIOLTO RESPIMAT) 2.5-2.5 MCG/ACT AERS Inhale 2 puffs into the lungs daily. 12/11/19   Mannam, Colbert Coyer, MD  tiZANidine (ZANAFLEX) 2 MG tablet Take 1 tablet (2 mg total) by mouth every 8 (eight) hours as needed. 11/24/22   Rodolph Bong, MD  traMADol (ULTRAM) 50 MG tablet Take 1-2 tablets (50-100 mg total) by mouth every 6 (six) hours as needed for severe pain. 12/28/22   Pickenpack-Cousar, Arty Baumgartner, NP     Vital Signs: Temperature 98.1, blood pressure 146/79, heart rate 84, respirations 18, O2 sat 99% room air      Code Status: FULL CODE    Physical Exam: Awake, frail appearing WM; hard of hearing ; daughter in law in room; chest- distant BS , some scatt rhonchi; heart- nl rate, occ ectopy noted,+murmur; abd- soft,+BS,NT; no LE edema  Imaging: No results  found.  Labs:  CBC: Recent Labs    04/24/22 1234  WBC 9.3  HGB 11.6*  HCT 33.9*  PLT 450*    COAGS: No results for input(s): "INR", "APTT" in the last 8760 hours.  BMP: Recent Labs    04/24/22 1234 12/28/22 0813 01/01/23 1450 01/08/23 1428  NA 139 134* 134* 133*  K 3.9 4.1 4.3 3.7  CL 107 100 100 100  CO2 GLUCOSE 104* 139* 117* 154*  BUN 31* 37* 29* 21  CALCIUM 9.1 8.4* 8.8* 8.8*  CREATININE 1.52* 1.37* 1.31* 1.31*  GFRNONAA 46* 52* 55* 55*    LIVER FUNCTION TESTS: Recent Labs    04/24/22 1234  BILITOT 0.7  AST 18  ALT 12  ALKPHOS 86  PROT 7.2  ALBUMIN 4.0    Assessment and Plan: 82 yo male with a past medical history including anxiety, carotid artery stenosis, chronic kidney disease, COPD, GERD, hyperlipidemia, hypertension, peripheral vascular disease, thoracic  AAA, LUL lung cancer 2018 and 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 He presents now with neck pain, dysphagia, odynophagia , weight loss and malnutrition from either tumor recurrence or treatment effects. He is scheduled today for percutaneous gastrostomy tube placement to assist with nutrition. Risks and benefits of image guided gastrostomy tube placement was discussed with the patient /daughter in law including, but not limited to the need for a barium enema during the procedure, bleeding, infection, peritonitis and/or damage to adjacent structures as well as inability to place tube due to overlying bowel.   All of the patient's questions were answered, patient is agreeable to proceed.  Consent signed and in chart.    Electronically Signed: D. Jeananne RamaKevin Korine Winton, PA-C 01/11/2023, 9:02 PM   I spent a total of 25 minutes at the the patient's bedside AND on the patient's hospital floor or unit, greater than 50% of which was counseling/coordinating care for percutaneous gastrostomy tube placement

## 2023-01-11 NOTE — Progress Notes (Deleted)
Palliative Medicine [facility]  [phone] [phone]) [phone]   Name: Nathaniel Long Date: 01/11/2023 MRN: [id number]  DOB: Oct 14, 1940  Patient Care Team: [name], MD as PCP - General (Internal Medicine) Malmfelt, Lise Auer, RN as Oncology Nurse Navigator Lonie Peak, MD as Consulting Physician (Radiation Oncology) Christia Reading, MD as Consulting Physician (Otolaryngology)   INTERVAL HISTORY: Nathaniel Long is a 82 y.o. 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  Palliative ask to see for symptom management in the setting of ongoing throat pain and discomfort.   SOCIAL HISTORY:     reports that he quit smoking about 5 years ago. His smoking use included cigarettes. He has a 33.00 pack-year smoking history. He has never used smokeless tobacco. He reports that he does not currently use alcohol. He reports that he does not use drugs.  ADVANCE DIRECTIVES:  Documents on file  CODE STATUS: Full code  PAST MEDICAL HISTORY: Past Medical History:  Diagnosis Date   Anxiety    Cancer    lung   Carotid artery stenosis    Cataract    Chronic lower back pain    CKD (chronic kidney disease)    Complication of anesthesia    "can't pee when I come out of OR" (07/18/2017)   Contact with chainsaw as cause of accidental injury 07/16/2017   sternal fracture with bilateral anterior fifth and sixth rib fractures /notes 07/16/2017   COPD (chronic obstructive pulmonary disease)    Dyspnea    GERD (gastroesophageal reflux disease)    Hard of hearing    High cholesterol    Hypertension    Peripheral vascular disease    Thoracic ascending aortic aneurysm     ALLERGIES:  is allergic to gabapentin and statins.  MEDICATIONS:  Current Outpatient Medications  Medication Sig Dispense Refill   ALPRAZolam (XANAX) 0.5 MG tablet Take 1 tablet (0.5 mg total)  by mouth at bedtime as needed for anxiety. 90 tablet 1   AMBULATORY NON FORMULARY MEDICATION L AFO for foot drop Dispense 1 Dx code: M21.372 Use as needed 1 Units 0   aspirin EC 81 MG tablet Take 1 tablet (81 mg total) by mouth daily. Swallow whole. 90 tablet 3   calcium carbonate (TUMS - DOSED IN MG ELEMENTAL CALCIUM) 500 MG chewable tablet Chew 1 tablet by mouth daily as needed for indigestion or heartburn.     ezetimibe (ZETIA) 10 MG tablet Take 5 mg by mouth daily.  3   ibuprofen (ADVIL) 400 MG tablet Take 1 tablet (400 mg total) by mouth every 4 (four) hours as needed. Take only as needed for throat/neck pain following radiation, as this medication can affect your kidneys. Taper as your pain improves. Take with food. 60 tablet 0   lidocaine (LIDODERM) 5 % Place 1 patch onto the skin daily. Remove & Discard patch within 12 hours or as directed by MD 30 patch 0   lidocaine (XYLOCAINE) 2 % solution Patient: Mix 1part 2% viscous lidocaine, 1part H20. Swish & swallow 10mL of diluted mixture, before meals and at bedtime, up to QID PRN soreness. 150 mL 4   LIVALO 4 MG TABS TAKE 1/2TABLET DAILY 45 tablet 3   magic mouthwash (nystatin, lidocaine, diphenhydrAMINE, alum & mag hydroxide) suspension Swish and swallow 10 mLs 3 (three) times daily. 150 mL 1   meloxicam (MOBIC) 7.5 MG tablet Take  1 tablet (7.5 mg total) by mouth daily. 15 tablet 0   Multiple Vitamins-Minerals (CENTRUM PO) Take 1 tablet by mouth every evening.     oxyCODONE ER (XTAMPZA ER) 9 MG C12A Take 9 mg by mouth every 12 (twelve) hours. 30 capsule 0   pantoprazole (PROTONIX) 40 MG tablet TAKE 1 TABLET BY MOUTH ONCE DAILY 1/2 HOUR BEFORE A  MEAL 90 tablet 3   polyethylene glycol (MIRALAX / GLYCOLAX) 17 g packet Take 17 g by mouth daily. 14 each 0   Tiotropium Bromide-Olodaterol (STIOLTO RESPIMAT) 2.5-2.5 MCG/ACT AERS Inhale 2 puffs into the lungs daily. 4 g 0   tiZANidine (ZANAFLEX) 2 MG tablet Take 1 tablet (2 mg total) by mouth  every 8 (eight) hours as needed. 30 tablet 3   traMADol (ULTRAM) 50 MG tablet Take 1-2 tablets (50-100 mg total) by mouth every 6 (six) hours as needed for severe pain. 120 tablet 0   No current facility-administered medications for this visit.   Facility-Administered Medications Ordered in Other Visits  Medication Dose Route Frequency Provider Last Rate Last Admin   0.9 %  sodium chloride infusion  250 mL Intravenous PRN Gold, Wayne E, PA-C       0.9 %  sodium chloride infusion   Intravenous Continuous Lonie Peak, MD       Melene Muller ON 01/28/2023] 0.9 %  sodium chloride infusion   Intravenous Once Lonie Peak, MD       Melene Muller ON 01/17/2023] 0.9 %  sodium chloride infusion   Intravenous Once Lonie Peak, MD       Melene Muller ON 01/19/2023] 0.9 %  sodium chloride infusion   Intravenous Once Lonie Peak, MD       0.9 %  sodium chloride infusion   Intravenous Once Lonie Peak, MD       0.9 %  sodium chloride infusion   Intravenous Once Lonie Peak, MD       0.9 %  sodium chloride infusion   Intravenous Once Lonie Peak, MD       0.9 %  sodium chloride infusion   Intravenous Once Lonie Peak, MD       0.9 %  sodium chloride infusion   Intravenous Once Lonie Peak, MD       0.9 %  sodium chloride infusion   Intravenous Once Lonie Peak, MD       Melene Muller ON 01/12/2023] 0.9 %  sodium chloride infusion   Intravenous Once Lonie Peak, MD       Melene Muller ON 01/04/2023] 0.9 %  sodium chloride infusion   Intravenous Once Lonie Peak, MD       Melene Muller ON 01/24/2023] 0.9 %  sodium chloride infusion   Intravenous Once Lonie Peak, MD       Melene Muller ON 01/26/2023] 0.9 %  sodium chloride infusion   Intravenous Once Lonie Peak, MD       Melene Muller ON 01/29/2023] 0.9 %  sodium chloride infusion   Intravenous Once Lonie Peak, MD       Melene Muller ON 01/31/2023] 0.9 %  sodium chloride infusion   Intravenous Once Lonie Peak, MD       Melene Muller ON 02/02/2023] 0.9 %  sodium chloride infusion   Intravenous Once Lonie Peak, MD       levalbuterol Pauline Aus) nebulizer solution 0.63 mg  0.63 mg Nebulization Q6H PRN Alleen Borne, MD   0.63 mg at 09/29/17 1646   sodium chloride flush (NS) 0.9 % injection 3 mL  3  mL Intravenous Q12H Gold, Wayne E, PA-C       sodium chloride flush (NS) 0.9 % injection 3 mL  3 mL Intravenous Q12H Gold, Wayne E, PA-C       sodium chloride flush (NS) 0.9 % injection 3 mL  3 mL Intravenous PRN Gold, Wayne E, PA-C        VITAL SIGNS: There were no vitals taken for this visit. There were no vitals filed for this visit.  Estimated body mass index is 20.31 kg/m as calculated from the following:   Height as of 12/27/22: 5\' 9"  (1.753 m).   Weight as of 01/10/23: 137 lb 8 oz (62.4 kg).   PERFORMANCE STATUS (ECOG) : 1 - Symptomatic but completely ambulatory   IMPRESSION:   Throat/Neck pain Mr. Lograsso has reported his pain is controlled on current regimen. Per [name] earlier today he reported he had minimal pain.   We reviewed current regimen: Xtampza 9mg  twice daily, Tramadol 50-100 mg every 6 hours as needed for breakthrough pain, tizanidine 1 mg as needed for muscle spasms, Mobic 7.5 mg daily, and lidocaine patch to shoulder area as needed.  Patient is tolerating current regimen without difficulty.  Taking medications as prescribed.  Again family is reporting pain is well-controlled.  He unfortunately has not received his Magic mouthwash from [facility] due to being a compound medication.  New prescription sent to [facility] outpatient pharmacy and family plans to pick up today after infusion visit.   We will continue to closely monitor and adjust medications as needed.  All questions answered and support provided.    Constipation    Goals of care  12/28/22- We discussed his current illness and what it means in the larger context of his on-going co-morbidities. Natural disease trajectory and expectations were discussed.   Mr. Probert is clear in his expressed wishes to continue to  treat the treatable allowing him every opportunity to continue to thrive for as long as he can.  He would only be interested in pursuing hospice if his symptoms cannot be better controlled and his health continues to deteriorate.  He also was not interested in invasive procedures.   We discussed his poor overall nutrition and plans for PEG tube placement.  Patient and family reports they wish to pursue and this is already been scheduled for April 12.  This would allow him to have nutrition and additional avenue for medication administration.  They are hopeful for improvement in his quality of life.   I discussed the importance of continued conversation with family and their medical providers regarding overall plan of care and treatment options, ensuring decisions are within the context of the patients values and GOCs.   PLAN:  Oxycodone ER 9 mg every 12 hours (tolerating well) Tramadol 50-100 mg every 6 hours as needed for breakthrough pain Mobic 7.5 mg daily Lidocaine patch to the right shoulder area Magic mouthwash swish and swallow MiraLAX daily Ongoing symptom management and goals of care support as needed. I will plan to see patient back in 2-4 weeks in collaboration to other oncology appointments.  Family knows to contact office if needed sooner.    Patient expressed understanding and was in agreement with this plan. He also understands that He can call the clinic at any time with any questions, concerns, or complaints.   Any controlled substances utilized were prescribed in the context of palliative care. PDMP has been reviewed.    Visit consisted of counseling and education dealing with  the complex and emotionally intense issues of symptom management and palliative care in the setting of serious and potentially life-threatening illness.Greater than 50%  of this time was spent counseling and coordinating care related to the above assessment and plan.  Willette AlmaNikki Pickenpack-Cousar, AGPCNP-BC   Palliative Medicine Team/[facility]  *Please note that this is a verbal dictation therefore any spelling or grammatical errors are due to the "Dragon [employer] One" system interpretation.

## 2023-01-12 ENCOUNTER — Other Ambulatory Visit: Payer: Self-pay

## 2023-01-12 ENCOUNTER — Encounter (HOSPITAL_COMMUNITY): Payer: Self-pay | Admitting: Otolaryngology

## 2023-01-12 ENCOUNTER — Ambulatory Visit (HOSPITAL_COMMUNITY)
Admission: RE | Admit: 2023-01-12 | Discharge: 2023-01-12 | Disposition: A | Discharge status: Home / Self Care | Payer: Medicare Other | Source: Ambulatory Visit | Attending: Radiation Oncology | Admitting: Radiation Oncology

## 2023-01-12 ENCOUNTER — Inpatient Hospital Stay: Payer: Medicare Other

## 2023-01-12 ENCOUNTER — Other Ambulatory Visit: Payer: Self-pay | Admitting: Radiation Oncology

## 2023-01-12 ENCOUNTER — Encounter (HOSPITAL_COMMUNITY): Payer: Self-pay

## 2023-01-12 ENCOUNTER — Other Ambulatory Visit: Payer: Self-pay | Admitting: Otolaryngology

## 2023-01-12 ENCOUNTER — Ambulatory Visit
Admission: RE | Admit: 2023-01-12 | Discharge: 2023-01-12 | Disposition: A | Discharge status: Home / Self Care | Payer: Medicare Other | Source: Ambulatory Visit | Attending: Radiation Oncology | Admitting: Radiation Oncology

## 2023-01-12 VITALS — BP 146/94 | HR 97 | Temp 97.8°F | Resp 18

## 2023-01-12 DIAGNOSIS — N189 Chronic kidney disease, unspecified: Secondary | ICD-10-CM | POA: Insufficient documentation

## 2023-01-12 DIAGNOSIS — I129 Hypertensive chronic kidney disease with stage 1 through stage 4 chronic kidney disease, or unspecified chronic kidney disease: Secondary | ICD-10-CM | POA: Insufficient documentation

## 2023-01-12 DIAGNOSIS — Z85118 Personal history of other malignant neoplasm of bronchus and lung: Secondary | ICD-10-CM | POA: Insufficient documentation

## 2023-01-12 DIAGNOSIS — C140 Malignant neoplasm of pharynx, unspecified: Secondary | ICD-10-CM

## 2023-01-12 DIAGNOSIS — J449 Chronic obstructive pulmonary disease, unspecified: Secondary | ICD-10-CM | POA: Insufficient documentation

## 2023-01-12 DIAGNOSIS — Z681 Body mass index (BMI) 19 or less, adult: Secondary | ICD-10-CM | POA: Insufficient documentation

## 2023-01-12 DIAGNOSIS — E785 Hyperlipidemia, unspecified: Secondary | ICD-10-CM | POA: Insufficient documentation

## 2023-01-12 DIAGNOSIS — Z923 Personal history of irradiation: Secondary | ICD-10-CM | POA: Insufficient documentation

## 2023-01-12 DIAGNOSIS — E46 Unspecified protein-calorie malnutrition: Secondary | ICD-10-CM | POA: Insufficient documentation

## 2023-01-12 DIAGNOSIS — I6529 Occlusion and stenosis of unspecified carotid artery: Secondary | ICD-10-CM | POA: Insufficient documentation

## 2023-01-12 DIAGNOSIS — K219 Gastro-esophageal reflux disease without esophagitis: Secondary | ICD-10-CM | POA: Insufficient documentation

## 2023-01-12 DIAGNOSIS — R1310 Dysphagia, unspecified: Secondary | ICD-10-CM | POA: Insufficient documentation

## 2023-01-12 DIAGNOSIS — I739 Peripheral vascular disease, unspecified: Secondary | ICD-10-CM | POA: Insufficient documentation

## 2023-01-12 DIAGNOSIS — F419 Anxiety disorder, unspecified: Secondary | ICD-10-CM | POA: Insufficient documentation

## 2023-01-12 DIAGNOSIS — R634 Abnormal weight loss: Secondary | ICD-10-CM | POA: Insufficient documentation

## 2023-01-12 HISTORY — PX: IR FLUORO RM 30-60 MIN: IMG2384

## 2023-01-12 LAB — PROTIME-INR
INR: 1.2 (ref 0.8–1.2)
Prothrombin Time: 14.6 seconds (ref 11.4–15.2)

## 2023-01-12 LAB — CMP (CANCER CENTER ONLY)
ALT: 11 U/L (ref 0–44)
AST: 12 U/L — ABNORMAL LOW (ref 15–41)
Albumin: 3.3 g/dL — ABNORMAL LOW (ref 3.5–5.0)
Alkaline Phosphatase: 75 U/L (ref 38–126)
Anion gap: 8 (ref 5–15)
BUN: 20 mg/dL (ref 8–23)
CO2: 27 mmol/L (ref 22–32)
Calcium: 8.7 mg/dL — ABNORMAL LOW (ref 8.9–10.3)
Chloride: 100 mmol/L (ref 98–111)
Creatinine: 1.3 mg/dL — ABNORMAL HIGH (ref 0.61–1.24)
GFR, Estimated: 55 mL/min — ABNORMAL LOW (ref >60)
Glucose, Bld: 122 mg/dL — ABNORMAL HIGH (ref 70–99)
Potassium: 3.6 mmol/L (ref 3.5–5.1)
Sodium: 135 mmol/L (ref 135–145)
Total Bilirubin: 0.6 mg/dL (ref 0.3–1.2)
Total Protein: 7.4 g/dL (ref 6.5–8.1)

## 2023-01-12 LAB — BASIC METABOLIC PANEL
Anion gap: 9 (ref 5–15)
BUN: 21 mg/dL (ref 8–23)
CO2: 23 mmol/L (ref 22–32)
Calcium: 8 mg/dL — ABNORMAL LOW (ref 8.9–10.3)
Chloride: 100 mmol/L (ref 98–111)
Creatinine, Ser: 1.2 mg/dL (ref 0.61–1.24)
GFR, Estimated: >60 mL/min (ref >60)
Glucose, Bld: 113 mg/dL — ABNORMAL HIGH (ref 70–99)
Potassium: 3.9 mmol/L (ref 3.5–5.1)
Sodium: 132 mmol/L — ABNORMAL LOW (ref 135–145)

## 2023-01-12 LAB — MAGNESIUM: Magnesium: 2.1 mg/dL (ref 1.7–2.4)

## 2023-01-12 LAB — PHOSPHORUS: Phosphorus: 3.3 mg/dL (ref 2.5–4.6)

## 2023-01-12 MED ORDER — SODIUM CHLORIDE 0.9 % IV SOLN: Freq: Once | INTRAVENOUS | Status: AC

## 2023-01-12 MED ORDER — CEFAZOLIN SODIUM-DEXTROSE 2-4 GM/100ML-% IV SOLN
2.0000 g | Freq: Once | INTRAVENOUS | Status: DC
  Filled 2023-01-12: qty 100

## 2023-01-12 MED ORDER — SODIUM CHLORIDE 0.9 % IV SOLN: INTRAVENOUS | Status: DC

## 2023-01-12 NOTE — Patient Instructions (Signed)

## 2023-01-12 NOTE — Progress Notes (Signed)
PCP - [name], MD.   Cardiologist - Dr  Jacinto Halim EKG - 07/13/2022 Chest x-ray - Denies ECHO - 07/18/2017 Cardiac Cath - Denies  CPAP - Denies   DM Denies   Blood Thinner Instructions: Denies Aspirin Instructions: Stopped a week ago  ERAS Protcol - N/A COVID TEST- DOA  Anesthesia review: Yes  -------------  SDW INSTRUCTIONS:  Your procedure is scheduled on Monday April 15 th. Please report to [facility] Main Entrance "A" at 0530 A.M., and check in at the Admitting office. Call this number if you have problems the morning of surgery: [phone]   Remember: Do not eat or drink after midnight the night before your surgery    Medications to take morning of surgery with a sip of water include:  pantoprazole (PROTONIX) 40 MG tablet  Tiotropium Bromide-Olodaterol (STIOLTO RESPIMAT) 2.5-2.5 MCG/ACT AERS  oxyCODONE ER (XTAMPZA ER)   IF NEEDED traMADol (ULTRAM)  tiZANidine (ZANAFLEX)    As of today, STOP taking any Aspirin (unless otherwise instructed by your surgeon), Aleve, Naproxen, Ibuprofen, Motrin, Advil, Goody's, BC's, all herbal medications, fish oil, and all vitamins.    The Morning of Surgery Do not wear jewelry, make-up or nail polish. Do not wear lotions, powders, or perfumes/colognes, or deodorant Do not bring valuables to the hospital. [facility] is not responsible for any belongings or valuables.  If you are a smoker, DO NOT Smoke 24 hours prior to surgery  If you wear a CPAP at night please bring your mask the morning of surgery   Remember that you must have someone to transport you home after your surgery, and remain with you for 24 hours if you are discharged the same day.  Please bring cases for contacts, glasses, hearing aids, dentures or bridgework because it cannot be worn into surgery.   Patients discharged the day of surgery will not be allowed to drive home.   Please shower the NIGHT BEFORE/MORNING OF SURGERY (use antibacterial soap like  DIAL soap if possible). Wear comfortable clothes the morning of surgery. Oral Hygiene is also important to reduce your risk of infection.  Remember - BRUSH YOUR TEETH THE MORNING OF SURGERY WITH YOUR REGULAR TOOTHPASTE  Patient denies shortness of breath, fever, cough and chest pain.

## 2023-01-12 NOTE — Progress Notes (Addendum)
Patient unable to proceed with tube as planned. This is per Dr. Bryn Gulling after reviewing imaging and determining that proceeding would not be possible d/t location of colon in the abdomen. Dr. Bryn Gulling to the bedside to provide update re: plan and inability to proceed. This was explained to patient and Daughter in Press photographer. Both verbalized understanding. Will proceed back to the Cancer Center for [facility]F as planned and discuss with patient, oncology provider and family plan moving forward.

## 2023-01-14 NOTE — Anesthesia Preprocedure Evaluation (Signed)
Anesthesia Evaluation  Patient identified by MRN, date of birth, ID band Patient awake    Reviewed: Allergy & Precautions, NPO status , Patient's Chart, lab work & pertinent test results  Airway Mallampati: III  TM Distance: >3 FB Neck ROM: Limited  Mouth opening: Limited Mouth Opening  Dental  (+) Teeth Intact, Dental Advisory Given   Pulmonary shortness of breath, COPD, former smoker   Pulmonary exam normal breath sounds clear to auscultation       Cardiovascular hypertension, + Peripheral Vascular Disease  Normal cardiovascular exam Rhythm:Regular Rate:Normal  TTE 2018 - Left ventricle: The cavity size was normal. Systolic function was    normal. The estimated ejection fraction was in the range of 55%    to 60%. Wall motion was normal; there were no regional wall    motion abnormalities. Doppler parameters are consistent with    abnormal left ventricular relaxation (grade 1 diastolic    dysfunction).  - Aortic valve: There was very mild stenosis. Mean gradient (S): 10    mm Hg. Valve area (VTI): 2.39 cm^2. Valve area (Vmax): 2.13 cm^2.    Valve area (Vmean): 2.05 cm^2.  - Left atrium: The atrium was mildly dilated.     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  Reproductive/Obstetrics                             Anesthesia Physical Anesthesia Plan  ASA: 3  Anesthesia Plan: General   Post-op Pain Management: Minimal or no pain anticipated   Induction:  Intravenous  PONV Risk Score and Plan: 2 and Ondansetron and Propofol infusion  Airway Management Planned: Oral ETT, Natural Airway, Mask and Video Laryngoscope Planned  Additional Equipment:   Intra-op Plan:   Post-operative Plan: Extubation in OR  Informed Consent: I have reviewed the patients History and Physical, chart, labs and discussed the procedure including the risks, benefits and alternatives for the proposed anesthesia with the patient or authorized representative who has indicated his/her understanding and acceptance.     Dental advisory given  Plan Discussed with: CRNA  Anesthesia Plan Comments:        Anesthesia Quick Evaluation

## 2023-01-15 ENCOUNTER — Encounter (HOSPITAL_COMMUNITY): Payer: Self-pay | Admitting: Otolaryngology

## 2023-01-15 ENCOUNTER — Inpatient Hospital Stay: Payer: Medicare Other

## 2023-01-15 ENCOUNTER — Ambulatory Visit (HOSPITAL_COMMUNITY): Payer: Medicare Other | Admitting: Anesthesiology

## 2023-01-15 ENCOUNTER — Other Ambulatory Visit: Payer: Self-pay

## 2023-01-15 ENCOUNTER — Ambulatory Visit: Payer: Medicare Other

## 2023-01-15 ENCOUNTER — Encounter (HOSPITAL_COMMUNITY): Admission: RE | Disposition: E | Payer: Self-pay | Source: Home / Self Care | Attending: Internal Medicine

## 2023-01-15 ENCOUNTER — Inpatient Hospital Stay (HOSPITAL_COMMUNITY)
Admission: RE | Admit: 2023-01-15 | Discharge: 2023-01-31 | DRG: 146 | Disposition: E | Payer: Medicare Other | Attending: Internal Medicine | Admitting: Internal Medicine

## 2023-01-15 ENCOUNTER — Telehealth: Payer: Self-pay | Admitting: Nurse Practitioner

## 2023-01-15 DIAGNOSIS — Z923 Personal history of irradiation: Secondary | ICD-10-CM | POA: Diagnosis not present

## 2023-01-15 DIAGNOSIS — K219 Gastro-esophageal reflux disease without esophagitis: Secondary | ICD-10-CM | POA: Diagnosis present

## 2023-01-15 DIAGNOSIS — N1831 Chronic kidney disease, stage 3a: Secondary | ICD-10-CM | POA: Diagnosis present

## 2023-01-15 DIAGNOSIS — Z7982 Long term (current) use of aspirin: Secondary | ICD-10-CM

## 2023-01-15 DIAGNOSIS — Z638 Other specified problems related to primary support group: Secondary | ICD-10-CM

## 2023-01-15 DIAGNOSIS — J449 Chronic obstructive pulmonary disease, unspecified: Secondary | ICD-10-CM | POA: Diagnosis not present

## 2023-01-15 DIAGNOSIS — Z791 Long term (current) use of non-steroidal anti-inflammatories (NSAID): Secondary | ICD-10-CM

## 2023-01-15 DIAGNOSIS — Z981 Arthrodesis status: Secondary | ICD-10-CM

## 2023-01-15 DIAGNOSIS — I2510 Atherosclerotic heart disease of native coronary artery without angina pectoris: Secondary | ICD-10-CM | POA: Diagnosis not present

## 2023-01-15 DIAGNOSIS — J440 Chronic obstructive pulmonary disease with acute lower respiratory infection: Secondary | ICD-10-CM | POA: Diagnosis present

## 2023-01-15 DIAGNOSIS — R54 Age-related physical debility: Secondary | ICD-10-CM | POA: Diagnosis present

## 2023-01-15 DIAGNOSIS — J189 Pneumonia, unspecified organism: Secondary | ICD-10-CM | POA: Diagnosis not present

## 2023-01-15 DIAGNOSIS — C139 Malignant neoplasm of hypopharynx, unspecified: Secondary | ICD-10-CM | POA: Diagnosis present

## 2023-01-15 DIAGNOSIS — Y92230 Patient room in hospital as the place of occurrence of the external cause: Secondary | ICD-10-CM | POA: Diagnosis not present

## 2023-01-15 DIAGNOSIS — C140 Malignant neoplasm of pharynx, unspecified: Secondary | ICD-10-CM

## 2023-01-15 DIAGNOSIS — M21372 Foot drop, left foot: Secondary | ICD-10-CM | POA: Diagnosis present

## 2023-01-15 DIAGNOSIS — E785 Hyperlipidemia, unspecified: Secondary | ICD-10-CM | POA: Diagnosis present

## 2023-01-15 DIAGNOSIS — J690 Pneumonitis due to inhalation of food and vomit: Secondary | ICD-10-CM | POA: Diagnosis not present

## 2023-01-15 DIAGNOSIS — E43 Unspecified severe protein-calorie malnutrition: Secondary | ICD-10-CM | POA: Diagnosis present

## 2023-01-15 DIAGNOSIS — Z66 Do not resuscitate: Secondary | ICD-10-CM | POA: Diagnosis present

## 2023-01-15 DIAGNOSIS — Z681 Body mass index (BMI) 19 or less, adult: Secondary | ICD-10-CM

## 2023-01-15 DIAGNOSIS — I10 Essential (primary) hypertension: Secondary | ICD-10-CM

## 2023-01-15 DIAGNOSIS — I129 Hypertensive chronic kidney disease with stage 1 through stage 4 chronic kidney disease, or unspecified chronic kidney disease: Secondary | ICD-10-CM | POA: Diagnosis present

## 2023-01-15 DIAGNOSIS — Z5320 Procedure and treatment not carried out because of patient's decision for unspecified reasons: Secondary | ICD-10-CM | POA: Diagnosis not present

## 2023-01-15 DIAGNOSIS — Z8249 Family history of ischemic heart disease and other diseases of the circulatory system: Secondary | ICD-10-CM

## 2023-01-15 DIAGNOSIS — D630 Anemia in neoplastic disease: Secondary | ICD-10-CM | POA: Diagnosis present

## 2023-01-15 DIAGNOSIS — G928 Other toxic encephalopathy: Secondary | ICD-10-CM | POA: Diagnosis not present

## 2023-01-15 DIAGNOSIS — Z515 Encounter for palliative care: Secondary | ICD-10-CM | POA: Diagnosis not present

## 2023-01-15 DIAGNOSIS — Z1152 Encounter for screening for COVID-19: Secondary | ICD-10-CM | POA: Diagnosis not present

## 2023-01-15 DIAGNOSIS — I739 Peripheral vascular disease, unspecified: Secondary | ICD-10-CM | POA: Diagnosis present

## 2023-01-15 DIAGNOSIS — F419 Anxiety disorder, unspecified: Secondary | ICD-10-CM

## 2023-01-15 DIAGNOSIS — N189 Chronic kidney disease, unspecified: Secondary | ICD-10-CM | POA: Diagnosis not present

## 2023-01-15 DIAGNOSIS — Z87891 Personal history of nicotine dependence: Secondary | ICD-10-CM

## 2023-01-15 DIAGNOSIS — A419 Sepsis, unspecified organism: Secondary | ICD-10-CM | POA: Diagnosis not present

## 2023-01-15 DIAGNOSIS — D649 Anemia, unspecified: Secondary | ICD-10-CM | POA: Diagnosis present

## 2023-01-15 DIAGNOSIS — N183 Chronic kidney disease, stage 3 unspecified: Secondary | ICD-10-CM | POA: Diagnosis present

## 2023-01-15 DIAGNOSIS — E7800 Pure hypercholesterolemia, unspecified: Secondary | ICD-10-CM | POA: Diagnosis present

## 2023-01-15 DIAGNOSIS — E876 Hypokalemia: Secondary | ICD-10-CM | POA: Diagnosis present

## 2023-01-15 DIAGNOSIS — G8929 Other chronic pain: Secondary | ICD-10-CM | POA: Diagnosis present

## 2023-01-15 DIAGNOSIS — Z800 Family history of malignant neoplasm of digestive organs: Secondary | ICD-10-CM

## 2023-01-15 DIAGNOSIS — Z79891 Long term (current) use of opiate analgesic: Secondary | ICD-10-CM

## 2023-01-15 DIAGNOSIS — Z79899 Other long term (current) drug therapy: Secondary | ICD-10-CM | POA: Diagnosis not present

## 2023-01-15 DIAGNOSIS — T402X5A Adverse effect of other opioids, initial encounter: Secondary | ICD-10-CM | POA: Diagnosis not present

## 2023-01-15 DIAGNOSIS — J42 Unspecified chronic bronchitis: Secondary | ICD-10-CM | POA: Diagnosis not present

## 2023-01-15 DIAGNOSIS — Z7189 Other specified counseling: Secondary | ICD-10-CM | POA: Diagnosis not present

## 2023-01-15 DIAGNOSIS — R1310 Dysphagia, unspecified: Secondary | ICD-10-CM | POA: Diagnosis present

## 2023-01-15 DIAGNOSIS — H9190 Unspecified hearing loss, unspecified ear: Secondary | ICD-10-CM | POA: Diagnosis present

## 2023-01-15 DIAGNOSIS — M79602 Pain in left arm: Secondary | ICD-10-CM | POA: Diagnosis not present

## 2023-01-15 DIAGNOSIS — C103 Malignant neoplasm of posterior wall of oropharynx: Secondary | ICD-10-CM | POA: Diagnosis not present

## 2023-01-15 DIAGNOSIS — Z5941 Food insecurity: Secondary | ICD-10-CM

## 2023-01-15 DIAGNOSIS — D631 Anemia in chronic kidney disease: Secondary | ICD-10-CM | POA: Diagnosis not present

## 2023-01-15 DIAGNOSIS — Z85118 Personal history of other malignant neoplasm of bronchus and lung: Secondary | ICD-10-CM

## 2023-01-15 DIAGNOSIS — R682 Dry mouth, unspecified: Secondary | ICD-10-CM | POA: Diagnosis present

## 2023-01-15 DIAGNOSIS — M542 Cervicalgia: Secondary | ICD-10-CM | POA: Diagnosis present

## 2023-01-15 DIAGNOSIS — Z888 Allergy status to other drugs, medicaments and biological substances status: Secondary | ICD-10-CM

## 2023-01-15 DIAGNOSIS — M545 Low back pain, unspecified: Secondary | ICD-10-CM | POA: Diagnosis present

## 2023-01-15 DIAGNOSIS — M79601 Pain in right arm: Secondary | ICD-10-CM | POA: Diagnosis not present

## 2023-01-15 DIAGNOSIS — Z902 Acquired absence of lung [part of]: Secondary | ICD-10-CM

## 2023-01-15 DIAGNOSIS — Z8679 Personal history of other diseases of the circulatory system: Secondary | ICD-10-CM

## 2023-01-15 HISTORY — PX: DIRECT LARYNGOSCOPY: SHX5326

## 2023-01-15 LAB — CBC
HCT: 24.3 % — ABNORMAL LOW (ref 39.0–52.0)
Hemoglobin: 7.8 g/dL — ABNORMAL LOW (ref 13.0–17.0)
MCH: 29.7 pg (ref 26.0–34.0)
MCHC: 32.1 g/dL (ref 30.0–36.0)
MCV: 92.4 fL (ref 80.0–100.0)
Platelets: 435 10*3/uL — ABNORMAL HIGH (ref 150–400)
RBC: 2.63 MIL/uL — ABNORMAL LOW (ref 4.22–5.81)
RDW: 13.8 % (ref 11.5–15.5)
WBC: 9.8 10*3/uL (ref 4.0–10.5)
nRBC: 0 % (ref 0.0–0.2)

## 2023-01-15 LAB — SARS CORONAVIRUS 2 BY RT PCR: SARS Coronavirus 2 by RT PCR: NEGATIVE

## 2023-01-15 SURGERY — LARYNGOSCOPY, DIRECT
Anesthesia: General | Laterality: Bilateral

## 2023-01-15 MED ORDER — LIDOCAINE 2% (20 MG/ML) 5 ML SYRINGE
INTRAMUSCULAR | Status: DC | PRN
  Administered 2023-01-15: 40 mg via INTRAVENOUS

## 2023-01-15 MED ORDER — MELOXICAM 7.5 MG PO TABS
7.5000 mg | ORAL_TABLET | Freq: Every day | ORAL | Status: DC
  Filled 2023-01-15 (×3): qty 1

## 2023-01-15 MED ORDER — PROPOFOL 500 MG/50ML IV EMUL
INTRAVENOUS | Status: DC | PRN
  Administered 2023-01-15: 50 ug/kg/min via INTRAVENOUS

## 2023-01-15 MED ORDER — EPHEDRINE 5 MG/ML INJ
INTRAVENOUS | Status: AC
  Filled 2023-01-15: qty 5

## 2023-01-15 MED ORDER — MAGIC MOUTHWASH W/LIDOCAINE
10.0000 mL | Freq: Three times a day (TID) | ORAL | Status: DC
  Administered 2023-01-15 (×2): 10 mL via ORAL
  Filled 2023-01-15 (×6): qty 10

## 2023-01-15 MED ORDER — TRAMADOL HCL 50 MG PO TABS: 50.0000 mg | ORAL_TABLET | Freq: Four times a day (QID) | ORAL | Status: DC | PRN

## 2023-01-15 MED ORDER — EPHEDRINE 5 MG/ML INJ
INTRAVENOUS | Status: AC
  Filled 2023-01-15: qty 15

## 2023-01-15 MED ORDER — PRAVASTATIN SODIUM 10 MG PO TABS: 20.0000 mg | ORAL_TABLET | Freq: Every day | ORAL | Status: DC

## 2023-01-15 MED ORDER — EZETIMIBE 10 MG PO TABS
5.0000 mg | ORAL_TABLET | Freq: Every day | ORAL | Status: DC
  Filled 2023-01-15: qty 1

## 2023-01-15 MED ORDER — PROPOFOL 10 MG/ML IV BOLUS
INTRAVENOUS | Status: AC
  Filled 2023-01-15: qty 20

## 2023-01-15 MED ORDER — LACTATED RINGERS IV SOLN: INTRAVENOUS | Status: DC | PRN

## 2023-01-15 MED ORDER — SURGILUBE EX GEL
CUTANEOUS | Status: DC | PRN
  Administered 2023-01-15: 1 via TOPICAL

## 2023-01-15 MED ORDER — ARFORMOTEROL TARTRATE 15 MCG/2ML IN NEBU
15.0000 ug | INHALATION_SOLUTION | Freq: Two times a day (BID) | RESPIRATORY_TRACT | Status: DC
  Administered 2023-01-15 – 2023-01-22 (×13): 15 ug via RESPIRATORY_TRACT
  Filled 2023-01-15 (×14): qty 2

## 2023-01-15 MED ORDER — FENTANYL CITRATE (PF) 250 MCG/5ML IJ SOLN
INTRAMUSCULAR | Status: AC
  Filled 2023-01-15: qty 5

## 2023-01-15 MED ORDER — CEFAZOLIN SODIUM-DEXTROSE 2-4 GM/100ML-% IV SOLN
2.0000 g | INTRAVENOUS | Status: AC
  Administered 2023-01-16: 2 g via INTRAVENOUS

## 2023-01-15 MED ORDER — OXYCODONE HCL ER 10 MG PO T12A
10.0000 mg | EXTENDED_RELEASE_TABLET | Freq: Two times a day (BID) | ORAL | Status: DC
  Administered 2023-01-15: 10 mg via ORAL
  Filled 2023-01-15 (×2): qty 1

## 2023-01-15 MED ORDER — ONDANSETRON HCL 4 MG/2ML IJ SOLN
INTRAMUSCULAR | Status: AC
  Filled 2023-01-15: qty 6

## 2023-01-15 MED ORDER — FENTANYL CITRATE (PF) 100 MCG/2ML IJ SOLN
25.0000 ug | INTRAMUSCULAR | Status: DC | PRN
  Administered 2023-01-15 (×3): 25 ug via INTRAVENOUS

## 2023-01-15 MED ORDER — FENTANYL CITRATE (PF) 250 MCG/5ML IJ SOLN
INTRAMUSCULAR | Status: DC | PRN
  Administered 2023-01-15: 25 ug via INTRAVENOUS

## 2023-01-15 MED ORDER — OXYMETAZOLINE HCL 0.05 % NA SOLN
NASAL | Status: DC | PRN
  Administered 2023-01-15: 30 via TOPICAL

## 2023-01-15 MED ORDER — PANTOPRAZOLE SODIUM 40 MG PO TBEC
40.0000 mg | DELAYED_RELEASE_TABLET | Freq: Every day | ORAL | Status: DC
  Administered 2023-01-15: 40 mg via ORAL
  Filled 2023-01-15: qty 1

## 2023-01-15 MED ORDER — CALCIUM CARBONATE ANTACID 500 MG PO CHEW: 1.0000 | CHEWABLE_TABLET | Freq: Every day | ORAL | Status: DC | PRN

## 2023-01-15 MED ORDER — FENTANYL CITRATE (PF) 100 MCG/2ML IJ SOLN
INTRAMUSCULAR | Status: AC
  Administered 2023-01-15: 25 ug via INTRAVENOUS
  Filled 2023-01-15: qty 2

## 2023-01-15 MED ORDER — ONDANSETRON HCL 4 MG/2ML IJ SOLN: 4.0000 mg | INTRAMUSCULAR | Status: DC | PRN

## 2023-01-15 MED ORDER — PHENYLEPHRINE HCL-NACL 20-0.9 MG/250ML-% IV SOLN
INTRAVENOUS | Status: DC | PRN
  Administered 2023-01-15: 50 ug/min via INTRAVENOUS

## 2023-01-15 MED ORDER — LIDOCAINE 2% (20 MG/ML) 5 ML SYRINGE
INTRAMUSCULAR | Status: AC
  Filled 2023-01-15: qty 5

## 2023-01-15 MED ORDER — PROPOFOL 10 MG/ML IV BOLUS
INTRAVENOUS | Status: DC | PRN
  Administered 2023-01-15: 70 mg via INTRAVENOUS

## 2023-01-15 MED ORDER — SUGAMMADEX SODIUM 200 MG/2ML IV SOLN
INTRAVENOUS | Status: DC | PRN
  Administered 2023-01-15: 125 mg via INTRAVENOUS

## 2023-01-15 MED ORDER — IBUPROFEN 400 MG PO TABS: 400.0000 mg | ORAL_TABLET | ORAL | Status: DC | PRN

## 2023-01-15 MED ORDER — ROCURONIUM BROMIDE 10 MG/ML (PF) SYRINGE
PREFILLED_SYRINGE | INTRAVENOUS | Status: DC | PRN
  Administered 2023-01-15: 40 mg via INTRAVENOUS

## 2023-01-15 MED ORDER — OXYMETAZOLINE HCL 0.05 % NA SOLN
NASAL | Status: AC
  Filled 2023-01-15: qty 30

## 2023-01-15 MED ORDER — LACTATED RINGERS IV SOLN: INTRAVENOUS | Status: DC

## 2023-01-15 MED ORDER — KCL IN DEXTROSE-NACL 20-5-0.45 MEQ/L-%-% IV SOLN
INTRAVENOUS | Status: DC
  Filled 2023-01-15 (×2): qty 1000

## 2023-01-15 MED ORDER — POLYETHYLENE GLYCOL 3350 17 G PO PACK
17.0000 g | PACK | Freq: Every day | ORAL | Status: DC
  Administered 2023-01-15: 17 g via ORAL
  Filled 2023-01-15: qty 1

## 2023-01-15 MED ORDER — LIDOCAINE 5 % EX PTCH
1.0000 | MEDICATED_PATCH | CUTANEOUS | Status: DC
  Administered 2023-01-15 – 2023-01-22 (×5): 1 via TRANSDERMAL
  Filled 2023-01-15 (×5): qty 1

## 2023-01-15 MED ORDER — CHLORHEXIDINE GLUCONATE 0.12 % MT SOLN
15.0000 mL | Freq: Once | OROMUCOSAL | Status: AC
  Administered 2023-01-15: 15 mL via OROMUCOSAL
  Filled 2023-01-15: qty 15

## 2023-01-15 MED ORDER — ROCURONIUM BROMIDE 10 MG/ML (PF) SYRINGE
PREFILLED_SYRINGE | INTRAVENOUS | Status: AC
  Filled 2023-01-15: qty 20

## 2023-01-15 MED ORDER — NYSTATIN 100000 UNIT/ML MT SUSP: 10.0000 mL | Freq: Three times a day (TID) | OROMUCOSAL | Status: DC

## 2023-01-15 MED ORDER — ONDANSETRON HCL 4 MG PO TABS: 4.0000 mg | ORAL_TABLET | ORAL | Status: DC | PRN

## 2023-01-15 MED ORDER — DEXAMETHASONE SODIUM PHOSPHATE 10 MG/ML IJ SOLN
INTRAMUSCULAR | Status: AC
  Filled 2023-01-15: qty 2

## 2023-01-15 MED ORDER — ASPIRIN 81 MG PO TBEC
81.0000 mg | DELAYED_RELEASE_TABLET | Freq: Every day | ORAL | Status: DC
  Filled 2023-01-15: qty 1

## 2023-01-15 MED ORDER — MORPHINE SULFATE (PF) 2 MG/ML IV SOLN
2.0000 mg | INTRAVENOUS | Status: DC | PRN
  Administered 2023-01-15 – 2023-01-21 (×5): 2 mg via INTRAVENOUS
  Administered 2023-01-22: 4 mg via INTRAVENOUS
  Filled 2023-01-15: qty 1
  Filled 2023-01-15: qty 2
  Filled 2023-01-15 (×5): qty 1

## 2023-01-15 MED ORDER — EPINEPHRINE HCL (NASAL) 0.1 % NA SOLN: NASAL | Status: DC | PRN

## 2023-01-15 MED ORDER — ONDANSETRON HCL 4 MG/2ML IJ SOLN
INTRAMUSCULAR | Status: DC | PRN
  Administered 2023-01-15: 4 mg via INTRAVENOUS

## 2023-01-15 MED ORDER — UMECLIDINIUM BROMIDE 62.5 MCG/ACT IN AEPB
1.0000 | INHALATION_SPRAY | Freq: Every day | RESPIRATORY_TRACT | Status: DC
  Administered 2023-01-17 – 2023-01-22 (×5): 1 via RESPIRATORY_TRACT
  Filled 2023-01-15: qty 7

## 2023-01-15 MED ORDER — LIDOCAINE VISCOUS HCL 2 % MT SOLN: 15.0000 mL | OROMUCOSAL | Status: DC | PRN

## 2023-01-15 MED ORDER — ORAL CARE MOUTH RINSE: 15.0000 mL | Freq: Once | OROMUCOSAL | Status: AC

## 2023-01-15 MED ORDER — PHENYLEPHRINE 80 MCG/ML (10ML) SYRINGE FOR IV PUSH (FOR BLOOD PRESSURE SUPPORT)
PREFILLED_SYRINGE | INTRAVENOUS | Status: DC | PRN
  Administered 2023-01-15 (×2): 160 ug via INTRAVENOUS

## 2023-01-15 MED ORDER — TIZANIDINE HCL 2 MG PO TABS: 2.0000 mg | ORAL_TABLET | Freq: Three times a day (TID) | ORAL | Status: DC | PRN

## 2023-01-15 SURGICAL SUPPLY — 29 items
BAG COUNTER SPONGE SURGICOUNT (BAG) ×1 IMPLANT
BAG SPNG CNTER NS LX DISP (BAG) ×1
BALLN PULM 15 16.5 18X75 (BALLOONS)
BALLOON PULM 15 16.5 18X75 (BALLOONS) IMPLANT
CANISTER SUCT 3000ML PPV (MISCELLANEOUS) ×1 IMPLANT
CNTNR URN SCR LID CUP LEK RST (MISCELLANEOUS) IMPLANT
CONT SPEC 4OZ STRL OR WHT (MISCELLANEOUS)
COVER BACK TABLE 60X90IN (DRAPES) ×1 IMPLANT
COVER MAYO STAND STRL (DRAPES) ×1 IMPLANT
DRAPE HALF SHEET 40X57 (DRAPES) ×1 IMPLANT
DRSG TELFA 3X8 NADH STRL (GAUZE/BANDAGES/DRESSINGS) IMPLANT
GAUZE 4X4 16PLY ~~LOC~~+RFID DBL (SPONGE) ×1 IMPLANT
GLOVE BIO SURGEON STRL SZ7.5 (GLOVE) ×1 IMPLANT
GOWN STRL REUS W/ TWL LRG LVL3 (GOWN DISPOSABLE) IMPLANT
GOWN STRL REUS W/TWL LRG LVL3 (GOWN DISPOSABLE)
GUARD TEETH (MISCELLANEOUS) ×1 IMPLANT
KIT TURNOVER KIT B (KITS) ×1 IMPLANT
NDL HYPO 25GX1X1/2 BEV (NEEDLE) IMPLANT
NDL TRANS ORAL INJECTION (NEEDLE) IMPLANT
NEEDLE HYPO 25GX1X1/2 BEV (NEEDLE) IMPLANT
NEEDLE TRANS ORAL INJECTION (NEEDLE) IMPLANT
NS IRRIG 1000ML POUR BTL (IV SOLUTION) ×1 IMPLANT
PAD ARMBOARD 7.5X6 YLW CONV (MISCELLANEOUS) ×2 IMPLANT
PATTIES SURGICAL .5 X3 (DISPOSABLE) IMPLANT
POSITIONER HEAD DONUT 9IN (MISCELLANEOUS) IMPLANT
SOL ANTI FOG 6CC (MISCELLANEOUS) IMPLANT
SURGILUBE 2OZ TUBE FLIPTOP (MISCELLANEOUS) IMPLANT
TOWEL GREEN STERILE FF (TOWEL DISPOSABLE) ×2 IMPLANT
TUBE CONNECTING 12X1/4 (SUCTIONS) ×1 IMPLANT

## 2023-01-15 NOTE — Brief Op Note (Signed)
01/15/2023  8:13 AM  PATIENT:  Nathaniel Long  82 y.o. male  PRE-OPERATIVE DIAGNOSIS:  Malignant neoplasm of pharynx  unspecified CMS/HCC  POST-OPERATIVE DIAGNOSIS:  Malignant neoplasm of pharynx unspecified CMS/HCC  PROCEDURE:  Procedure(s): DIRECT LARYNGOSCOPY WITH BIOPSY (Bilateral)  SURGEON:  Surgeon(s) and Role:    Christia Reading, MD - Primary  PHYSICIAN ASSISTANT:   ASSISTANTS: none   ANESTHESIA:   general  EBL:  None   BLOOD ADMINISTERED:none  DRAINS: none   LOCAL MEDICATIONS USED:  NONE  SPECIMEN:  Source of Specimen:  right and left posterior pharyngeal wall  DISPOSITION OF SPECIMEN:  PATHOLOGY  COUNTS:  YES  TOURNIQUET:  * No tourniquets in log *  DICTATION: .Note written in EPIC  PLAN OF CARE: Admit to inpatient   PATIENT DISPOSITION:  PACU - hemodynamically stable.   Delay start of Pharmacological VTE agent (>24hrs) due to surgical blood loss or risk of bleeding: no

## 2023-01-15 NOTE — Progress Notes (Signed)
Patient spit up a pill, which was partially dissolved and unable to be identified.

## 2023-01-15 NOTE — Transfer of Care (Signed)
Immediate Anesthesia Transfer of Care Note  Patient: Nathaniel Long  Procedure(s) Performed: DIRECT LARYNGOSCOPY WITH BIOPSY (Bilateral)  Patient Location: PACU  Anesthesia Type:General  Level of Consciousness: sedated  Airway & Oxygen Therapy: Patient Spontanous Breathing  Post-op Assessment: Report given to RN and Post -op Vital signs reviewed and stable  Post vital signs: Reviewed and stable  Last Vitals:  Vitals Value Taken Time  BP 141/66 01/15/23 0823  Temp 98   Pulse 68 01/15/23 0830  Resp 20 01/15/23 0830  SpO2 98 % 01/15/23 0830  Vitals shown include unvalidated device data.  Last Pain:  Vitals:   01/15/23 0623  TempSrc:   PainSc: 6       Patients Stated Pain Goal: 4 (01/15/23 3832)  Complications: No notable events documented.

## 2023-01-15 NOTE — H&P (Signed)
Nathaniel Long is an 82 y.o. male.   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  He presents for repeat biopsy.  A PEG tube was considered but could not be placed Friday due to his anatomy.  Past Medical History:  Diagnosis Date   Anxiety    Cancer    lung   Carotid artery stenosis    Cataract    Chronic lower back pain    CKD (chronic kidney disease)    Complication of anesthesia    "can't pee when I come out of OR" (07/18/2017)   Contact with chainsaw as cause of accidental injury 07/16/2017   sternal fracture with bilateral anterior fifth and sixth rib fractures /notes 07/16/2017   COPD (chronic obstructive pulmonary disease)    Dyspnea    GERD (gastroesophageal reflux disease)    Hard of hearing    High cholesterol    Hypertension    Peripheral vascular disease    Thoracic ascending aortic aneurysm     Past Surgical History:  Procedure Laterality Date   ABDOMINAL AORTOGRAM W/LOWER EXTREMITY N/A 05/01/2017   Procedure: Abdominal Aortogram w/Lower Extremity;  Surgeon: Yates Decamp, MD;  [facility];  Service: Cardiovascular;  Laterality: N/A;   BACK SURGERY     FINGER REPLANTATION Left    "ring finger"   HEMORRHOID SURGERY     IR FLUORO RM 30-60 MIN  01/12/2023   POSTERIOR LUMBAR FUSION  1986   RENAL ANGIOGRAPHY Bilateral 12/18/2017   Procedure: RENAL ANGIOGRAPHY;  Surgeon: Elder Negus, MD;  [facility];  Service: Cardiovascular;  Laterality: Bilateral;   VIDEO ASSISTED THORACOSCOPY (VATS)/ LOBECTOMY Left 09/19/2017   Procedure: LEFT VIDEO ASSISTED THORACOSCOPY (VATS)/ LEFT UPPER LOBECTOMY, LYMPH NODE DISECTION;  Surgeon: Loreli Slot, MD;  Location: [facility];  Service: Thoracic;  Laterality: Left;   VIDEO BRONCHOSCOPY WITH ENDOBRONCHIAL NAVIGATION N/A 09/05/2017   Procedure: VIDEO BRONCHOSCOPY WITH  ENDOBRONCHIAL NAVIGATION;  Surgeon: Leslye Peer, MD;  Location: [facility];  Service: Thoracic;  Laterality: N/A;   VIDEO BRONCHOSCOPY WITH ENDOBRONCHIAL ULTRASOUND N/A 09/05/2017   Procedure: VIDEO BRONCHOSCOPY WITH ENDOBRONCHIAL ULTRASOUND;  Surgeon: Leslye Peer, MD;  Location: [facility];  Service: Thoracic;  Laterality: N/A;    Family History  Problem Relation Age of Onset   Heart disease Mother    Liver disease Father    Dementia Sister    Colon polyps Brother    Colon cancer Brother    Social History:  reports that he quit smoking about 5 years ago. His smoking use included cigarettes. He has a 33.00 pack-year smoking history. He has never used smokeless tobacco. He reports that he does not currently use alcohol. He reports that he does not use drugs.  Allergies:  Allergies  Allergen Reactions   Gabapentin Other (See Comments)    dizziness   Statins Other (See Comments)    Muscle weakness    Medications Prior to Admission  Medication Sig Dispense Refill   aspirin EC 81 MG tablet Take 1 tablet (81 mg total) by mouth daily. Swallow whole. 90 tablet 3   lidocaine (LIDODERM) 5 % Place 1 patch onto the skin daily. Remove & Discard patch within 12 hours or as directed by MD 30 patch 0   lidocaine (XYLOCAINE) 2 % solution Patient: Mix 1part 2% viscous lidocaine, 1part H20. Swish & swallow 12mL  of diluted mixture, 30min before meals and at bedtime, u to QID PRN soreness. 150 mL 4   magic mouthwash (nystatin, lidocaine, diphenhydrAMINE, alum & mag hydroxide) suspension Swish and swallow 10 mLs 3 (three) times daily. 150 mL 1   meloxicam (MOBIC) 7.5 MG tablet Take 1 tablet (7.5 mg total) by mouth daily. 15 tablet 0   oxyCODONE ER (XTAMPZA ER) 9 MG C12A Take 9 mg by mouth every 12 (twelve) hours. 30 capsule 0   pantoprazole (PROTONIX) 40 MG tablet TAKE 1 TABLET BY MOUTH ONCE DAILY 1/2 HOUR BEFORE A  MEAL (Patient taking differently: Take 40 mg by mouth daily. TAKE 1 TABLET BY MOUTH ONCE  DAILY 1/2 HOUR BEFORE A  MEAL) 90 tablet 3   traMADol (ULTRAM) 50 MG tablet Take 1-2 tablets (50-100 mg total) by mouth every 6 (six) hours as needed for severe pain. 120 tablet 0   ALPRAZolam (XANAX) 0.5 MG tablet Take 1 tablet (0.5 mg total) by mouth at bedtime as needed for anxiety. (Patient not taking: Reported on 01/12/2023) 90 tablet 1   AMBULATORY NON FORMULARY MEDICATION L AFO for foot drop Dispense 1 Dx code: Z61.096 Use as needed 1 Units 0   calcium carbonate (TUMS - DOSED IN MG ELEMENTAL CALCIUM) 500 MG chewable tablet Chew 1 tablet by mouth daily as needed for indigestion or heartburn.     ezetimibe (ZETIA) 10 MG tablet Take 5 mg by mouth daily. (Patient not taking: Reported on 01/12/2023)  3   ibuprofen (ADVIL) 400 MG tablet Take 1 tablet (400 mg total) by mouth every 4 (four) hours as needed. Take only as needed for throat/neck pain following radiation, as this medication can affect your kidneys. Taper as your pain improves. Take with food. (Patient not taking: Reported on 01/12/2023) 60 tablet 0   LIVALO 4 MG TABS TAKE 1/2TABLET DAILY (Patient not taking: Reported on 01/12/2023) 45 tablet 3   Multiple Vitamins-Minerals (CENTRUM PO) Take 1 tablet by mouth every evening. (Patient not taking: Reported on 01/12/2023)     polyethylene glycol (MIRALAX / GLYCOLAX) 17 g packet Take 17 g by mouth daily. (Patient not taking: Reported on 01/12/2023) 14 each 0   Tiotropium Bromide-Olodaterol (STIOLTO RESPIMAT) 2.5-2.5 MCG/ACT AERS Inhale 2 puffs into the lungs daily. (Patient not taking: Reported on 01/12/2023) 4 g 0   tiZANidine (ZANAFLEX) 2 MG tablet Take 1 tablet (2 mg total) by mouth every 8 (eight) hours as needed. (Patient taking differently: Take 1 mg by mouth every 8 (eight) hours as needed for muscle spasms.) 30 tablet 3    Results for orders placed or performed during the hospital encounter of 01/13/2023 (from the past 48 hour(s))  SARS Coronavirus 2 by RT PCR (hospital order, performed in [facility] hospital lab) *cepheid single result test* Anterior Nasal Swab     Status: None   Collection Time: 01/08/2023  5:38 AM   Specimen: Anterior Nasal Swab  Result Value Ref Range   SARS Coronavirus 2 by RT PCR NEGATIVE NEGATIVE    Comment: Performed at [facility], [address], [place], [place] [address]  CBC per protocol     Status: Abnormal   Collection Time: 01/24/2023  6:03 AM  Result Value Ref Range   WBC 9.8 4.0 - 10.5 K/uL   RBC 2.63 (L) 4.22 - 5.81 MIL/uL   Hemoglobin 7.8 (L) 13.0 - 17.0 g/dL   HCT 98.1 (L) 19.1 - 47.8 %   MCV 92.4 80.0 - 100.0  fL   MCH 29.7 26.0 - 34.0 pg   MCHC 32.1 30.0 - 36.0 g/dL   RDW 85.9 29.2 - 44.6 %   Platelets 435 (H) 150 - 400 K/uL   nRBC 0.0 0.0 - 0.2 %    Comment: Performed at [facility], [address], [place], [place] [address]   No results found.  Review of Systems  Constitutional:  Positive for unexpected weight change.  HENT:  Positive for sore throat.   All other systems reviewed and are negative.   Blood pressure (!) 160/75, pulse 79, temperature 97.9 F (36.6 C), temperature source Oral, resp. rate 18, height 5' 9.5" (1.765 m), weight 62.5 kg, SpO2 99 %. Physical Exam Constitutional:      Appearance: Normal appearance.  HENT:     Head: Normocephalic and atraumatic.     Right Ear: External ear normal.     Left Ear: External ear normal.     Nose: Nose normal.     Mouth/Throat:     Mouth: Mucous membranes are moist.     Pharynx: Oropharynx is clear.  Eyes:     Extraocular Movements: Extraocular movements intact.     Conjunctiva/sclera: Conjunctivae normal.     Pupils: Pupils are equal, round, and reactive to light.  Cardiovascular:     Rate and Rhythm: Normal rate.  Pulmonary:     Effort: Pulmonary effort is normal.  Musculoskeletal:     Cervical back: Normal range of motion.  Skin:    General: Skin is warm and dry.  Neurological:     General: No focal deficit present.     Mental Status: He is alert  and oriented to person, place, and time.  Psychiatric:        Mood and Affect: Mood normal.        Behavior: Behavior normal.        Thought Content: Thought content normal.        Judgment: Judgment normal.      Assessment/Plan Pharyngeal cancer  To OR for DL with biopsy.  Will admit to hospital for general surgery consultation for G-tube placement.  Christia Reading, MD 01/21/2023, 7:29 AM

## 2023-01-15 NOTE — Telephone Encounter (Signed)
Patient's daughter called to cancel 4/15 appointments due to patient being admitted to hospital. Unsure of when they will be discharged. Forwarding information to RN and palliative care.

## 2023-01-15 NOTE — Consult Note (Signed)
Consult Note  Nathaniel Long 03-29-1941  [id number].    Requesting MD: Dr. Jenne Pane Chief Complaint/Reason for Consult: gastrostomy tube placement  HPI:  82 y.o. 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  He has neck pain, dysphagia, odynophagia, weight loss and malnutrition from either tumor recurrence or wound issues.  His most recent PET scan was not terribly concerning for significant recurrence.  Given his pain and symptoms, an evaluation for direct laryngoscopy was recommended for biopsy vs just evaluation.  He was evaluated by IR for PEG placement 4/12 but this was not able to be done due give location of colon anterior to stomach.  General surgery asked to evaluate for gastrostomy tube placement today upon arrival for direct laryngoscopy today.   History is obtained from the patient's DIL as he is very HOH and have to communicate via a white board.  Substance use: former smoker - quit 2018 Blood thinners: none Past Surgeries: no prior abdominal surgeries   ROS: ROS as above.  Family History  Problem Relation Age of Onset   Heart disease Mother    Liver disease Father    Dementia Sister    Colon polyps Brother    Colon cancer Brother     Past Medical History:  Diagnosis Date   Anxiety    Cancer    lung   Carotid artery stenosis    Cataract    Chronic lower back pain    CKD (chronic kidney disease)    Complication of anesthesia    "can't pee when I come out of OR" (07/18/2017)   Contact with chainsaw as cause of accidental injury 07/16/2017   sternal fracture with bilateral anterior fifth and sixth rib fractures /notes 07/16/2017   COPD (chronic obstructive pulmonary disease)    Dyspnea    GERD (gastroesophageal  reflux disease)    Hard of hearing    High cholesterol    Hypertension    Peripheral vascular disease    Thoracic ascending aortic aneurysm     Past Surgical History:  Procedure Laterality Date   ABDOMINAL AORTOGRAM W/LOWER EXTREMITY N/A 05/01/2017   Procedure: Abdominal Aortogram w/Lower Extremity;  Surgeon: Yates Decamp, MD;  [facility];  Service: Cardiovascular;  Laterality: N/A;   BACK SURGERY     FINGER REPLANTATION Left    "ring finger"   HEMORRHOID SURGERY     IR FLUORO RM 30-60 MIN  01/12/2023   POSTERIOR LUMBAR FUSION  1986   RENAL ANGIOGRAPHY Bilateral 12/18/2017   Procedure: RENAL ANGIOGRAPHY;  Surgeon: Elder Negus, MD;  [facility];  Service: Cardiovascular;  Laterality: Bilateral;   VIDEO ASSISTED THORACOSCOPY (VATS)/ LOBECTOMY Left 09/19/2017   Procedure: LEFT VIDEO ASSISTED THORACOSCOPY (VATS)/ LEFT UPPER LOBECTOMY, LYMPH NODE DISECTION;  Surgeon: Loreli Slot, MD;  [facility]: [facility];  Service: Thoracic;  Laterality: Left;   VIDEO BRONCHOSCOPY WITH ENDOBRONCHIAL NAVIGATION N/A 09/05/2017   Procedure: VIDEO BRONCHOSCOPY WITH ENDOBRONCHIAL NAVIGATION;  Surgeon: Leslye Peer, MD;  [facility]: [facility];  Service: Thoracic;  Laterality: N/A;   VIDEO BRONCHOSCOPY WITH ENDOBRONCHIAL ULTRASOUND N/A 09/05/2017   Procedure: VIDEO BRONCHOSCOPY WITH ENDOBRONCHIAL ULTRASOUND;  Surgeon: Leslye Peer, MD;  Location: [facility];  Service: Thoracic;  Laterality: N/A;  Social History:  reports that he quit smoking about 5 years ago. His smoking use included cigarettes. He has a 33.00 pack-year smoking history. He has never used smokeless tobacco. He reports that he does not currently use alcohol. He reports that he does not use drugs.  Allergies:  Allergies  Allergen Reactions   Gabapentin Other (See Comments)    dizziness   Statins Other (See Comments)    Muscle weakness    Medications Prior to Admission  Medication Sig Dispense Refill    aspirin EC 81 MG tablet Take 1 tablet (81 mg total) by mouth daily. Swallow whole. 90 tablet 3   lidocaine (LIDODERM) 5 % Place 1 patch onto the skin daily. Remove & Discard patch within 12 hours or as directed by MD 30 patch 0   lidocaine (XYLOCAINE) 2 % solution Patient: Mix 1part 2% viscous lidocaine, 1part H20. Swish & swallow 10mL of diluted mixture, before meals and at bedtime, up to QID PRN soreness. 150 mL 4   magic mouthwash (nystatin, lidocaine, diphenhydrAMINE, alum & mag hydroxide) suspension Swish and swallow 10 mLs 3 (three) times daily. 150 mL 1   meloxicam (MOBIC) 7.5 MG tablet Take 1 tablet (7.5 mg total) by mouth daily. 15 tablet 0   oxyCODONE ER (XTAMPZA ER) 9 MG C12A Take 9 mg by mouth every 12 (twelve) hours. 30 capsule 0   pantoprazole (PROTONIX) 40 MG tablet TAKE 1 TABLET BY MOUTH ONCE DAILY 1/2 HOUR BEFORE A  MEAL (Patient taking differently: Take 40 mg by mouth daily. TAKE 1 TABLET BY MOUTH ONCE DAILY 1/2 HOUR BEFORE A  MEAL) 90 tablet 3   traMADol (ULTRAM) 50 MG tablet Take 1-2 tablets (50-100 mg total) by mouth every 6 (six) hours as needed for severe pain. 120 tablet 0   ALPRAZolam (XANAX) 0.5 MG tablet Take 1 tablet (0.5 mg total) by mouth at bedtime as needed for anxiety. (Patient not taking: Reported on 01/12/2023) 90 tablet 1   AMBULATORY NON FORMULARY MEDICATION L AFO for foot drop Dispense 1 Dx code: Z61.096 Use as needed 1 Units 0   calcium carbonate (TUMS - DOSED IN MG ELEMENTAL CALCIUM) 500 MG chewable tablet Chew 1 tablet by mouth daily as needed for indigestion or heartburn.     ezetimibe (ZETIA) 10 MG tablet Take 5 mg by mouth daily. (Patient not taking: Reported on 01/12/2023)  3   ibuprofen (ADVIL) 400 MG tablet Take 1 tablet (400 mg total) by mouth every 4 (four) hours as needed. Take only as needed for throat/neck pain following radiation, as this medication can affect your kidneys. Taper as your pain improves. Take with food. (Patient not taking:  Reported on 01/12/2023) 60 tablet 0   LIVALO 4 MG TABS TAKE 1/2TABLET DAILY (Patient not taking: Reported on 01/12/2023) 45 tablet 3   Multiple Vitamins-Minerals (CENTRUM PO) Take 1 tablet by mouth every evening. (Patient not taking: Reported on 01/12/2023)     polyethylene glycol (MIRALAX / GLYCOLAX) 17 g packet Take 17 g by mouth daily. (Patient not taking: Reported on 01/12/2023) 14 each 0   Tiotropium Bromide-Olodaterol (STIOLTO RESPIMAT) 2.5-2.5 MCG/ACT AERS Inhale 2 puffs into the lungs daily. (Patient not taking: Reported on 01/12/2023) 4 g 0   tiZANidine (ZANAFLEX) 2 MG tablet Take 1 tablet (2 mg total) by mouth every 8 (eight) hours as needed. (Patient taking differently: Take 1 mg by mouth every 8 (eight) hours as needed for muscle spasms.) 30 tablet 3    Blood  pressure 137/89, pulse 90, temperature 97.8 F (36.6 C), resp. rate (!) 21, height 5' 9.5" (1.765 m), weight 62.5 kg, SpO2 93 %. Physical Exam: General: pleasant, WD, skinny male who is laying in bed in NAD, mostly sleeping HEENT: head is normocephalic, atraumatic.  Sclera are noninjected.  Pupils equal and round. EOMs intact.  Ears and nose without any masses or lesions.  Heart: regular, rate, and rhythm.  Normal s1,s2. No obvious murmurs, gallops, or rubs noted.   Lungs: diffuse coarse BS bilaterally.  Respiratory effort nonlabored Abd: soft, NT, ND, +BS, no masses, hernias, or organomegaly MSK: all 4 extremities are symmetrical with no cyanosis, clubbing, or edema. Psych: mostly sleeps, but does wake up occasionally to say some things to me.  So HOH that I am unable to have a conversation with him. DIL uses whiteboard to communicate.   Results for orders placed or performed during the hospital encounter of 01/26/2023 (from the past 48 hour(s))  SARS Coronavirus 2 by RT PCR (hospital order, performed in [facility] hospital lab) *cepheid single result test* Anterior Nasal Swab     Status: None   Collection Time: 01/18/2023  5:38 AM    Specimen: Anterior Nasal Swab  Result Value Ref Range   SARS Coronavirus 2 by RT PCR NEGATIVE NEGATIVE    Comment: Performed at [facility], [address] [place], [place] [address]  CBC per protocol     Status: Abnormal   Collection Time: 01/03/2023  6:03 AM  Result Value Ref Range   WBC 9.8 4.0 - 10.5 K/uL   RBC 2.63 (L) 4.22 - 5.81 MIL/uL   Hemoglobin 7.8 (L) 13.0 - 17.0 g/dL   HCT 93.8 (L) 10.1 - 75.1 %   MCV 92.4 80.0 - 100.0 fL   MCH 29.7 26.0 - 34.0 pg   MCHC 32.1 30.0 - 36.0 g/dL   RDW 02.5 85.2 - 77.8 %   Platelets 435 (H) 150 - 400 K/uL   nRBC 0.0 0.0 - 0.2 %    Comment: Performed at [facility], [address] [place], [place] [address]   No results found.    Assessment/Plan Pharyngeal cancer Intolerance to PO intake, malnutrition Gastrostomy tube placement  Patient seen and examined and relevant labs and imaging reviewed. He underwent laryngoscopy and biopsy today which showed posterior pharyngeal wall necrosis with extension into upper esophagus which could not be entered. IR unable to place PEG due to location of colon. I think it is reasonable to pursue surgical gastrostomy tube placement. Given necrotic findings in OR today, would plan lap vs open gastrostomy tube placement tomorrow.  This was discussed and explained to the DIL who is going to discuss everything via white board with him.  She is a Engineer, civil (consulting) and is going to be the one who mainly takes care of his tube and his feedings.   I met with the patient and his DIL at bedside. I discussed the indications, risks and aftercare of gastrostomy tube placement, mostly with the DIL as the patient can't hear. Discussed we will proceed with either laparoscopic vs open gastrostomy tube placement tomorrow in the OR. Risks include but are not limited to anesthesia (MI, CVA, death, aspiration), bleeding, infection, scarring, pain, leakage around the tube, skin irritation, tube dislodgement and wound issues. We  discussed the gastrostomy tube will need to stay in place for at least 6 weeks and the rationale for this. In addition discussed the tube may stay in place  for a longer period of time based on the patients nutritional needs and the possibility of periodically needing exchanges/replacement for various reasons which were discussed. The DIL is in agreement with the plan and wishes to proceed.  The patient has given his consent for tube placement on Friday with IR and is still agreeable to proceed as he asked me "When are we getting on with this thing!"  I told him tomorrow and he nodded in agreement.  All questions answered. NPO at midnight. Ancef ordered on call to OR.   FEN: CLD, NPO p MN, IVFs ID: Ancef VTE: ok for prophylaxis from our standpoint  I reviewed hospitalist notes, last 24 h vitals and pain scores, last 48 h intake and output, last 24 h labs and trends, and last 24 h imaging results.   Letha Cape, [facility] 01/12/2023, 1:44 PM Please see Amion for pager number during day hours 7:00am-4:30pm

## 2023-01-15 NOTE — Anesthesia Preprocedure Evaluation (Signed)
Anesthesia Evaluation  Patient identified by MRN, date of birth, ID band Patient awake    Reviewed: Allergy & Precautions, NPO status , Patient's Chart, lab work & pertinent test results  History of Anesthesia Complications (+) history of anesthetic complications (urinary retention)  Airway Mallampati: III  TM Distance: >3 FB Neck ROM: Limited   Comment: 01/15/2023: Grade II view with glidescope 3, 6.5 ETT, easy mask Dental  (+) Dental Advisory Given   Pulmonary COPD, former smoker Pulmonary nodule, mediastinal lymphadenopathy, s/p lobectomy   Pulmonary exam normal breath sounds clear to auscultation       Cardiovascular hypertension, + Peripheral Vascular Disease  + Valvular Problems/Murmurs (mild) AS  Rhythm:Regular Rate:Normal  HLD, carotid artery stenosis, thoracic ascending aortic aneurysm  TTE 08/06/2020: Normal LV systolic function with visual EF 55-60%. Left ventricle cavity  is normal in size. Moderate concentric hypertrophy of the left ventricle.  Normal global wall motion.  Left atrial cavity is moderately dilated at 4.6 cm.  Trileaflet aortic valve. Mild aortic stenosis. Trace aortic regurgitation.  Moderate aortic valve leaflet calcification. Mildly restricted aortic  valve leaflets. Peak PG of 19.2 and mean PG of 10.3 mm Hg and Calculated  aortic valve area 1.7 cm.  Normal mitral valve thickness.  Mild prolapse of the mitral valve  leaflets. Moderate posteriorly directed  (Grade III) mitral regurgitation.   Mild tricuspid regurgitation. No evidence of pulmonary hypertension. RVSP  measures 30 mmHg.     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  Per ENT on 01/12/2023, "Fiberoptic exam of the pharynx demonstrates a persistent area of exudative ulceration of the posterior pharyngeal wall that is concerning for persistent cancer."  FINDINGS from DL 1/61/0960:  Posterior pharyngeal wall fully necrotic with spine palpable in depth of necrosis.  Extends into upper esophagus that could not be entered.  Biopsies taken from each side of necrotic zone.  Reproductive/Obstetrics                             Anesthesia Physical Anesthesia Plan  ASA: 3  Anesthesia Plan: General   Post-op Pain Management:    Induction: Intravenous  PONV Risk Score and Plan: 2 and Ondansetron, Dexamethasone and Treatment may vary due to age or medical condition  Airway Management Planned: Oral ETT and Video Laryngoscope Planned  Additional Equipment:   Intra-op Plan:   Post-operative Plan: Extubation in OR  Informed Consent: I have reviewed the patients History and Physical, chart, labs and discussed the procedure including the risks, benefits and alternatives for the proposed anesthesia with the patient or authorized representative who has indicated his/her understanding and acceptance.     Dental advisory given  Plan Discussed with: CRNA and Anesthesiologist  Anesthesia Plan Comments: (Risks of general anesthesia discussed including, but not limited to, sore throat, hoarse voice, chipped/damaged teeth, injury to vocal cords, nausea and vomiting, allergic reactions,  lung infection, heart attack, stroke, and death. All questions answered. )       Anesthesia Quick Evaluation

## 2023-01-15 NOTE — Anesthesia Postprocedure Evaluation (Signed)
Anesthesia Post Note  Patient: Nathaniel Long  Procedure(s) Performed: DIRECT LARYNGOSCOPY WITH BIOPSY (Bilateral)     Patient location during evaluation: PACU Anesthesia Type: General Level of consciousness: awake and alert Pain management: pain level controlled Vital Signs Assessment: post-procedure vital signs reviewed and stable Respiratory status: spontaneous breathing, nonlabored ventilation, respiratory function stable and patient connected to nasal cannula oxygen Cardiovascular status: blood pressure returned to baseline and stable Postop Assessment: no apparent nausea or vomiting Anesthetic complications: no  No notable events documented.  Last Vitals:  Vitals:   01/15/23 0845 01/15/23 0900  BP: (!) 150/71 133/77  Pulse: 74 73  Resp: (!) 24 16  Temp:    SpO2: 99% 94%    Last Pain:  Vitals:   01/15/23 0830  TempSrc:   PainSc: Asleep                 Khalilah Hoke L Zigmond Trela

## 2023-01-15 NOTE — Anesthesia Procedure Notes (Signed)
Procedure Name: Intubation Date/Time: 01/15/2023 7:55 AM  Performed by: Darryl Nestle, CRNAPre-anesthesia Checklist: Patient identified, Emergency Drugs available, Suction available and Patient being monitored Patient Re-evaluated:Patient Re-evaluated prior to induction Oxygen Delivery Method: Circle system utilized Preoxygenation: Pre-oxygenation with 100% oxygen Induction Type: IV induction Ventilation: Mask ventilation without difficulty Laryngoscope Size: Mac, Glidescope and 3 Grade View: Grade II Tube type: Oral Tube size: 6.5 mm Number of attempts: 1 Airway Equipment and Method: Stylet and Oral airway Placement Confirmation: ETT inserted through vocal cords under direct vision, positive ETCO2 and breath sounds checked- equal and bilateral Secured at: 20 cm Tube secured with: Tape Dental Injury: Teeth and Oropharynx as per pre-operative assessment

## 2023-01-15 NOTE — Op Note (Signed)
PREOPERATIVE DIAGNOSIS:  Posterior pharyngeal wall cancer   POSTOPERATIVE DIAGNOSIS:  Posterior pharyngeal wall cancer   PROCEDURE:  Direct laryngoscopy with biopsy   SURGEON:  Christia Reading, MD   ANESTHESIA:  General endotracheal anesthesia   COMPLICATIONS:  None.   INDICATIONS:  The patient is an 82 year old male with a history of posterior pharyngeal wall cancer treated with radiation treatments.  He has been losing weight with increasing throat pain and presents to the operating room for biopsy.   FINDINGS:  Posterior pharyngeal wall fully necrotic with spine palpable in depth of necrosis.  Extends into upper esophagus that could not be entered.  Biopsies taken from each side of necrotic zone.   DESCRIPTION OF PROCEDURE:  The patient was identified in the holding room, informed consent having been obtained including discussion of risks, benefits and alternatives, the patient was brought to the operative suite and put the operative table in the supine position.  Anesthesia was induced and the patient was intubated by the Anesthesia team using a Glide scope.  The eyes were taped closed and bed was turned 90 degrees from anesthesia.  A tooth guard was placed over the upper teeth and a Dedo laryngoscope was used to evaluate the pharynx and larynx.  Findings are noted above.  Necrotic tissue was partially removed with cup forceps.  The contour of the cervical spine was easily palpated through the necrotic tissue.  Biopsies were taken from the posterior pharyngeal wall to either side of the area of necrosis.  A cervical laryngoscope was then used to attempt to follow the necrosis into the esophagus to establish vertical height but the esophagus could not be entered.  The tooth guard was removed and the patient was turned back to anesthesia for wakeup, was extubated, and taken to the recovery room in stable condition.

## 2023-01-16 ENCOUNTER — Inpatient Hospital Stay (HOSPITAL_COMMUNITY): Payer: Medicare Other

## 2023-01-16 ENCOUNTER — Encounter (HOSPITAL_COMMUNITY): Admission: RE | Disposition: E | Payer: Self-pay | Source: Home / Self Care | Attending: Internal Medicine

## 2023-01-16 ENCOUNTER — Inpatient Hospital Stay (HOSPITAL_COMMUNITY): Payer: Medicare Other | Admitting: Anesthesiology

## 2023-01-16 ENCOUNTER — Encounter (HOSPITAL_COMMUNITY): Payer: Self-pay | Admitting: Otolaryngology

## 2023-01-16 DIAGNOSIS — I2510 Atherosclerotic heart disease of native coronary artery without angina pectoris: Secondary | ICD-10-CM | POA: Diagnosis not present

## 2023-01-16 DIAGNOSIS — A419 Sepsis, unspecified organism: Secondary | ICD-10-CM | POA: Diagnosis not present

## 2023-01-16 DIAGNOSIS — D630 Anemia in neoplastic disease: Secondary | ICD-10-CM

## 2023-01-16 DIAGNOSIS — D631 Anemia in chronic kidney disease: Secondary | ICD-10-CM

## 2023-01-16 DIAGNOSIS — E43 Unspecified severe protein-calorie malnutrition: Secondary | ICD-10-CM | POA: Diagnosis present

## 2023-01-16 DIAGNOSIS — Z7189 Other specified counseling: Secondary | ICD-10-CM | POA: Diagnosis not present

## 2023-01-16 DIAGNOSIS — I129 Hypertensive chronic kidney disease with stage 1 through stage 4 chronic kidney disease, or unspecified chronic kidney disease: Secondary | ICD-10-CM | POA: Diagnosis not present

## 2023-01-16 DIAGNOSIS — E785 Hyperlipidemia, unspecified: Secondary | ICD-10-CM | POA: Diagnosis present

## 2023-01-16 DIAGNOSIS — Z515 Encounter for palliative care: Secondary | ICD-10-CM

## 2023-01-16 DIAGNOSIS — C103 Malignant neoplasm of posterior wall of oropharynx: Secondary | ICD-10-CM

## 2023-01-16 DIAGNOSIS — D649 Anemia, unspecified: Secondary | ICD-10-CM | POA: Diagnosis present

## 2023-01-16 DIAGNOSIS — C140 Malignant neoplasm of pharynx, unspecified: Secondary | ICD-10-CM | POA: Diagnosis not present

## 2023-01-16 DIAGNOSIS — J189 Pneumonia, unspecified organism: Secondary | ICD-10-CM | POA: Diagnosis not present

## 2023-01-16 DIAGNOSIS — N189 Chronic kidney disease, unspecified: Secondary | ICD-10-CM

## 2023-01-16 DIAGNOSIS — Z87891 Personal history of nicotine dependence: Secondary | ICD-10-CM

## 2023-01-16 HISTORY — PX: GASTROSTOMY: SHX5249

## 2023-01-16 LAB — CBC WITH DIFFERENTIAL/PLATELET
Abs Immature Granulocytes: 0.1 10*3/uL — ABNORMAL HIGH (ref 0.00–0.07)
Basophils Absolute: 0 10*3/uL (ref 0.0–0.1)
Basophils Relative: 0 %
Eosinophils Absolute: 0 10*3/uL (ref 0.0–0.5)
Eosinophils Relative: 0 %
HCT: 22 % — ABNORMAL LOW (ref 39.0–52.0)
Hemoglobin: 7.1 g/dL — ABNORMAL LOW (ref 13.0–17.0)
Immature Granulocytes: 1 %
Lymphocytes Relative: 1 %
Lymphs Abs: 0.1 10*3/uL — ABNORMAL LOW (ref 0.7–4.0)
MCH: 29.6 pg (ref 26.0–34.0)
MCHC: 32.3 g/dL (ref 30.0–36.0)
MCV: 91.7 fL (ref 80.0–100.0)
Monocytes Absolute: 0.2 10*3/uL (ref 0.1–1.0)
Monocytes Relative: 1 %
Neutro Abs: 16.8 10*3/uL — ABNORMAL HIGH (ref 1.7–7.7)
Neutrophils Relative %: 97 %
Platelets: 367 10*3/uL (ref 150–400)
RBC: 2.4 MIL/uL — ABNORMAL LOW (ref 4.22–5.81)
RDW: 13.9 % (ref 11.5–15.5)
WBC: 17.3 10*3/uL — ABNORMAL HIGH (ref 4.0–10.5)
nRBC: 0 % (ref 0.0–0.2)

## 2023-01-16 LAB — COMPREHENSIVE METABOLIC PANEL
ALT: 14 U/L (ref 0–44)
AST: 29 U/L (ref 15–41)
Albumin: 1.8 g/dL — ABNORMAL LOW (ref 3.5–5.0)
Alkaline Phosphatase: 57 U/L (ref 38–126)
Anion gap: 9 (ref 5–15)
BUN: 14 mg/dL (ref 8–23)
CO2: 24 mmol/L (ref 22–32)
Calcium: 7.3 mg/dL — ABNORMAL LOW (ref 8.9–10.3)
Chloride: 101 mmol/L (ref 98–111)
Creatinine, Ser: 1.25 mg/dL — ABNORMAL HIGH (ref 0.61–1.24)
GFR, Estimated: 58 mL/min — ABNORMAL LOW (ref >60)
Glucose, Bld: 150 mg/dL — ABNORMAL HIGH (ref 70–99)
Potassium: 3.4 mmol/L — ABNORMAL LOW (ref 3.5–5.1)
Sodium: 134 mmol/L — ABNORMAL LOW (ref 135–145)
Total Bilirubin: 1.1 mg/dL (ref 0.3–1.2)
Total Protein: 5 g/dL — ABNORMAL LOW (ref 6.5–8.1)

## 2023-01-16 LAB — CBC
HCT: 19.5 % — ABNORMAL LOW (ref 39.0–52.0)
Hemoglobin: 6.3 g/dL — CL (ref 13.0–17.0)
MCH: 29.4 pg (ref 26.0–34.0)
MCHC: 32.3 g/dL (ref 30.0–36.0)
MCV: 91.1 fL (ref 80.0–100.0)
Platelets: 270 10*3/uL (ref 150–400)
RBC: 2.14 MIL/uL — ABNORMAL LOW (ref 4.22–5.81)
RDW: 14 % (ref 11.5–15.5)
WBC: 11.7 10*3/uL — ABNORMAL HIGH (ref 4.0–10.5)
nRBC: 0 % (ref 0.0–0.2)

## 2023-01-16 LAB — PROCALCITONIN: Procalcitonin: 6.95 ng/mL

## 2023-01-16 LAB — SURGICAL PCR SCREEN
MRSA, PCR: NEGATIVE
Staphylococcus aureus: NEGATIVE

## 2023-01-16 LAB — TYPE AND SCREEN
ABO/RH(D): O POS
Antibody Screen: NEGATIVE

## 2023-01-16 LAB — LACTIC ACID, PLASMA
Lactic Acid, Venous: 1.1 mmol/L (ref 0.5–1.9)
Lactic Acid, Venous: 1.3 mmol/L (ref 0.5–1.9)

## 2023-01-16 LAB — GLUCOSE, CAPILLARY
Glucose-Capillary: 135 mg/dL — ABNORMAL HIGH (ref 70–99)
Glucose-Capillary: 110 mg/dL — ABNORMAL HIGH (ref 70–99)

## 2023-01-16 LAB — SURGICAL PATHOLOGY

## 2023-01-16 LAB — PREPARE RBC (CROSSMATCH)

## 2023-01-16 SURGERY — INSERTION OF GASTROSTOMY TUBE
Anesthesia: General | Site: Abdomen | Laterality: Left

## 2023-01-16 MED ORDER — FENTANYL CITRATE (PF) 100 MCG/2ML IJ SOLN: 25.0000 ug | INTRAMUSCULAR | Status: DC | PRN

## 2023-01-16 MED ORDER — LIDOCAINE 2% (20 MG/ML) 5 ML SYRINGE
INTRAMUSCULAR | Status: DC | PRN
  Administered 2023-01-16: 80 mg via INTRAVENOUS

## 2023-01-16 MED ORDER — EPHEDRINE 5 MG/ML INJ
INTRAVENOUS | Status: AC
  Filled 2023-01-16: qty 5

## 2023-01-16 MED ORDER — PHENYLEPHRINE 80 MCG/ML (10ML) SYRINGE FOR IV PUSH (FOR BLOOD PRESSURE SUPPORT)
PREFILLED_SYRINGE | INTRAVENOUS | Status: DC | PRN
  Administered 2023-01-16 (×2): 160 ug via INTRAVENOUS

## 2023-01-16 MED ORDER — SUGAMMADEX SODIUM 200 MG/2ML IV SOLN
INTRAVENOUS | Status: DC | PRN
  Administered 2023-01-16: 200 mg via INTRAVENOUS

## 2023-01-16 MED ORDER — SUCCINYLCHOLINE CHLORIDE 200 MG/10ML IV SOSY
PREFILLED_SYRINGE | INTRAVENOUS | Status: AC
  Filled 2023-01-16: qty 10

## 2023-01-16 MED ORDER — OXYCODONE HCL 5 MG/5ML PO SOLN: 5.0000 mg | Freq: Once | ORAL | Status: DC | PRN

## 2023-01-16 MED ORDER — SODIUM CHLORIDE 0.9 % IV SOLN
3.0000 g | Freq: Three times a day (TID) | INTRAVENOUS | Status: DC
  Administered 2023-01-16 – 2023-01-20 (×12): 3 g via INTRAVENOUS
  Filled 2023-01-16 (×12): qty 8

## 2023-01-16 MED ORDER — BUPIVACAINE HCL (PF) 0.25 % IJ SOLN
INTRAMUSCULAR | Status: AC
  Filled 2023-01-16: qty 30

## 2023-01-16 MED ORDER — ACETAMINOPHEN 10 MG/ML IV SOLN
1000.0000 mg | Freq: Four times a day (QID) | INTRAVENOUS | Status: AC
  Administered 2023-01-16 – 2023-01-17 (×4): 1000 mg via INTRAVENOUS
  Filled 2023-01-16 (×4): qty 100

## 2023-01-16 MED ORDER — BUPIVACAINE LIPOSOME 1.3 % IJ SUSP
INTRAMUSCULAR | Status: DC | PRN
  Administered 2023-01-16: 30 mL

## 2023-01-16 MED ORDER — PROPOFOL 10 MG/ML IV BOLUS
INTRAVENOUS | Status: AC
  Filled 2023-01-16: qty 20

## 2023-01-16 MED ORDER — PHENYLEPHRINE 80 MCG/ML (10ML) SYRINGE FOR IV PUSH (FOR BLOOD PRESSURE SUPPORT)
PREFILLED_SYRINGE | INTRAVENOUS | Status: AC
  Filled 2023-01-16: qty 10

## 2023-01-16 MED ORDER — EPHEDRINE SULFATE-NACL 50-0.9 MG/10ML-% IV SOSY
PREFILLED_SYRINGE | INTRAVENOUS | Status: DC | PRN
  Administered 2023-01-16: 5 mg via INTRAVENOUS

## 2023-01-16 MED ORDER — ONDANSETRON HCL 4 MG/2ML IJ SOLN
INTRAMUSCULAR | Status: DC | PRN
  Administered 2023-01-16: 4 mg via INTRAVENOUS

## 2023-01-16 MED ORDER — 0.9 % SODIUM CHLORIDE (POUR BTL) OPTIME
TOPICAL | Status: DC | PRN
  Administered 2023-01-16: 1000 mL

## 2023-01-16 MED ORDER — SODIUM CHLORIDE 0.9 % IV BOLUS
1000.0000 mL | Freq: Once | INTRAVENOUS | Status: AC | PRN
  Administered 2023-01-16: 1000 mL via INTRAVENOUS

## 2023-01-16 MED ORDER — LACTATED RINGERS IV SOLN
150.0000 mL/h | INTRAVENOUS | Status: DC
  Administered 2023-01-16 (×2): 150 mL/h via INTRAVENOUS

## 2023-01-16 MED ORDER — VASOPRESSIN 20 UNIT/ML IV SOLN
INTRAVENOUS | Status: DC | PRN
  Administered 2023-01-16 (×3): 1 [IU] via INTRAVENOUS

## 2023-01-16 MED ORDER — SUCCINYLCHOLINE CHLORIDE 200 MG/10ML IV SOSY
PREFILLED_SYRINGE | INTRAVENOUS | Status: DC | PRN
  Administered 2023-01-16: 80 mg via INTRAVENOUS

## 2023-01-16 MED ORDER — LACTATED RINGERS IV BOLUS (SEPSIS)
1000.0000 mL | Freq: Once | INTRAVENOUS | Status: AC
  Administered 2023-01-16: 1000 mL via INTRAVENOUS

## 2023-01-16 MED ORDER — ROCURONIUM BROMIDE 10 MG/ML (PF) SYRINGE
PREFILLED_SYRINGE | INTRAVENOUS | Status: DC | PRN
  Administered 2023-01-16: 30 mg via INTRAVENOUS

## 2023-01-16 MED ORDER — LIDOCAINE 2% (20 MG/ML) 5 ML SYRINGE
INTRAMUSCULAR | Status: AC
  Filled 2023-01-16: qty 5

## 2023-01-16 MED ORDER — FENTANYL CITRATE (PF) 250 MCG/5ML IJ SOLN
INTRAMUSCULAR | Status: DC | PRN
  Administered 2023-01-16: 50 ug via INTRAVENOUS

## 2023-01-16 MED ORDER — ONDANSETRON HCL 4 MG/2ML IJ SOLN: 4.0000 mg | Freq: Four times a day (QID) | INTRAMUSCULAR | Status: DC | PRN

## 2023-01-16 MED ORDER — MUPIROCIN 2 % EX OINT
1.0000 | TOPICAL_OINTMENT | Freq: Two times a day (BID) | CUTANEOUS | Status: AC
  Administered 2023-01-17 – 2023-01-20 (×7): 1 via NASAL
  Filled 2023-01-16: qty 22

## 2023-01-16 MED ORDER — OXYCODONE HCL 5 MG PO TABS: 5.0000 mg | ORAL_TABLET | Freq: Once | ORAL | Status: DC | PRN

## 2023-01-16 MED ORDER — VASOPRESSIN 20 UNIT/ML IV SOLN
INTRAVENOUS | Status: AC
  Filled 2023-01-16: qty 1

## 2023-01-16 MED ORDER — LACTATED RINGERS IV SOLN: INTRAVENOUS | Status: DC | PRN

## 2023-01-16 MED ORDER — SODIUM CHLORIDE 0.9% IV SOLUTION: Freq: Once | INTRAVENOUS | Status: AC

## 2023-01-16 MED ORDER — AMISULPRIDE (ANTIEMETIC) 5 MG/2ML IV SOLN: 10.0000 mg | Freq: Once | INTRAVENOUS | Status: DC | PRN

## 2023-01-16 MED ORDER — FENTANYL CITRATE (PF) 250 MCG/5ML IJ SOLN
INTRAMUSCULAR | Status: AC
  Filled 2023-01-16: qty 5

## 2023-01-16 MED ORDER — KCL IN DEXTROSE-NACL 20-5-0.45 MEQ/L-%-% IV SOLN: INTRAVENOUS | Status: DC

## 2023-01-16 MED ORDER — PHENYLEPHRINE HCL-NACL 20-0.9 MG/250ML-% IV SOLN
INTRAVENOUS | Status: DC | PRN
  Administered 2023-01-16: 40 ug/min via INTRAVENOUS

## 2023-01-16 MED ORDER — PROPOFOL 10 MG/ML IV BOLUS
INTRAVENOUS | Status: DC | PRN
  Administered 2023-01-16: 100 mg via INTRAVENOUS

## 2023-01-16 MED ORDER — BUPIVACAINE LIPOSOME 1.3 % IJ SUSP
INTRAMUSCULAR | Status: AC
  Filled 2023-01-16: qty 20

## 2023-01-16 SURGICAL SUPPLY — 43 items
ADH SKN CLS APL DERMABOND .7 (GAUZE/BANDAGES/DRESSINGS) ×1
APL PRP STRL LF DISP 70% ISPRP (MISCELLANEOUS) ×1
BAG DRAINAGE 1000ML ENFIT (BAG) IMPLANT
BAG DRN 75 TUBE LF 1000 ENFIT (BAG)
BINDER ABDOMINAL 12 ML 46-62 (SOFTGOODS) IMPLANT
BLADE CLIPPER SURG (BLADE) IMPLANT
CANISTER SUCT 3000ML PPV (MISCELLANEOUS) ×1 IMPLANT
CHLORAPREP W/TINT 26 (MISCELLANEOUS) ×1 IMPLANT
COVER SURGICAL LIGHT HANDLE (MISCELLANEOUS) ×1 IMPLANT
DERMABOND ADVANCED .7 DNX12 (GAUZE/BANDAGES/DRESSINGS) IMPLANT
DRAPE LAPAROSCOPIC ABDOMINAL (DRAPES) ×1 IMPLANT
DRSG COVADERM 4X6 (GAUZE/BANDAGES/DRESSINGS) IMPLANT
DRSG COVADERM 4X8 (GAUZE/BANDAGES/DRESSINGS) IMPLANT
ELECT CAUTERY BLADE 6.4 (BLADE) ×1 IMPLANT
ELECT REM PT RETURN 9FT ADLT (ELECTROSURGICAL) ×1
ELECTRODE REM PT RTRN 9FT ADLT (ELECTROSURGICAL) ×1 IMPLANT
GLOVE SURG SIGNA 7.5 PF LTX (GLOVE) ×1 IMPLANT
GOWN STRL REUS W/ TWL LRG LVL3 (GOWN DISPOSABLE) ×1 IMPLANT
GOWN STRL REUS W/ TWL XL LVL3 (GOWN DISPOSABLE) ×1 IMPLANT
GOWN STRL REUS W/TWL LRG LVL3 (GOWN DISPOSABLE) ×1
GOWN STRL REUS W/TWL XL LVL3 (GOWN DISPOSABLE) ×1
KIT BASIN OR (CUSTOM PROCEDURE TRAY) ×1 IMPLANT
KIT TURNOVER KIT B (KITS) ×1 IMPLANT
NDL HYPO 25GX1X1/2 BEV (NEEDLE) ×1 IMPLANT
NEEDLE HYPO 25GX1X1/2 BEV (NEEDLE) ×1 IMPLANT
NS IRRIG 1000ML POUR BTL (IV SOLUTION) ×2 IMPLANT
PACK GENERAL/GYN (CUSTOM PROCEDURE TRAY) ×1 IMPLANT
PAD ARMBOARD 7.5X6 YLW CONV (MISCELLANEOUS) ×1 IMPLANT
PENCIL SMOKE EVACUATOR (MISCELLANEOUS) ×1 IMPLANT
STAPLER VISISTAT 35W (STAPLE) ×1 IMPLANT
SUT MNCRL AB 4-0 PS2 18 (SUTURE) IMPLANT
SUT PDS AB 1 CT  36 (SUTURE) ×2
SUT PDS AB 1 CT 36 (SUTURE) IMPLANT
SUT PDS II 0 TP-1 LOOPED 60 (SUTURE) ×1 IMPLANT
SUT SILK 2 0 SH CR/8 (SUTURE) IMPLANT
SUT SILK 2 0 TIES 10X30 (SUTURE) IMPLANT
SUT VIC AB 3-0 SH 27 (SUTURE) ×1
SUT VIC AB 3-0 SH 27XBRD (SUTURE) IMPLANT
SYR CONTROL 10ML LL (SYRINGE) ×1 IMPLANT
SYRINGE TOOMEY DISP (SYRINGE) ×1 IMPLANT
TOWEL GREEN STERILE (TOWEL DISPOSABLE) ×1 IMPLANT
TOWEL GREEN STERILE FF (TOWEL DISPOSABLE) ×1 IMPLANT
TUBE GASTRO BOLUS 18FR ENFIT (TUBING) IMPLANT

## 2023-01-16 NOTE — Progress Notes (Signed)
  Transition of Care [facility]) Screening Note   Patient Details  Name: Nathaniel Long Date of Birth: [birth year]   Transition of Care [facility]) CM/SW Contact:    Harriet Masson, RN Phone Number: 01/26/2023, 12:48 PM    Transition of Care Department [facility]) has reviewed patient and no TOC needs have been identified at this time. We will continue to monitor patient advancement through interdisciplinary progression rounds. If new patient transition needs arise, please place a TOC consult.

## 2023-01-16 NOTE — Progress Notes (Signed)
Called to see patient for rapid response due to hypotension, fever, and tachycardia.  He was already having some rattle and increase WOB prior to g-tube placement this morning.   RN reports that he tried to swallow a pill last night and had to drink a lot of liquid and still couldn't get the pill down.  An hour later, the pill came back up.  Upon my arrival patient was sleeping with audible rhonchi and increased WOB.  He was febrile and BP had some up from the 70s to 110s after starting a 1L fluid bolus.  His HR was normal.  He woke up intermittently long enough to answer questions on a white board.  He denies any pain, including abdominal of chest, nausea, WOB, or difficulty breathing.  He did not know where he was, but could tell me Kendal Hymen was his wife at the bedside.  He was currently hemodynamically stable, but clearly needed a higher level of care for closer respiratory monitoring.  CXR is pending.  WBC has increased to 17K today from normal yesterday.  Suspect the patient has had an aspiration event and likely has PNA.  At this time, Dr. Ophelia Charter with the medical service arrived and took over the patient's care.    Letha Cape 12:51 PM 01/16/2023

## 2023-01-16 NOTE — Progress Notes (Signed)
Report called to Tresa Endo, RN on [facility]

## 2023-01-16 NOTE — Assessment & Plan Note (Signed)
-  Recent severe weight loss due to inability to take PO -Albumin is 1.8 -Based on GOC discussion, he is likely to benefit from nutrition consult

## 2023-01-16 NOTE — Progress Notes (Signed)
Pharmacy Antibiotic Note  Nathaniel Long is a 82 y.o. male admitted on 01/15/2023 for G-tube placement now with concern for  sepsis and aspiration PNA . Pharmacy has been consulted for Unasyn dosing. Cr stable at BL.  Plan: Unasyn 3g IV q8h  Height: 5' 9.5" (176.5 cm) Weight: 62.5 kg (137 lb 12.8 oz) IBW/kg (Calculated) : 71.85  Temp (24hrs), Avg:99.2 F (37.3 C), Min:97 F (36.1 C), Max:102.7 F (39.3 C)  Recent Labs  Lab 01/12/23 0912 01/12/23 1000 01/15/23 0603 01/16/23 1023 01/16/23 1155  WBC  --   --  9.8 17.3*  --   CREATININE 1.30* 1.20  --   --  1.25*  LATICACIDVEN  --   --   --   --  1.1    Estimated Creatinine Clearance: 41 mL/min (A) (by C-G formula based on SCr of 1.25 mg/dL (H)).    Allergies  Allergen Reactions   Gabapentin Other (See Comments)    dizziness   Statins Other (See Comments)    Muscle weakness    Fredonia Highland, PharmD, BCPS, [facility] Clinical Pharmacist [phone] Please check AMION for all [facility] numbers 01/16/2023

## 2023-01-16 NOTE — Assessment & Plan Note (Signed)
-  SIRS criteria in this patient includes: Leukocytosis, fever, tachycardia, tachypnea  -Patient has evidence of acute organ failure with recurrent hypotension (SBP < 90 or MAP < 65 x 2 readings) that is not easily explained by another condition. -While awaiting blood cultures, this appears to be a preseptic condition. -Sepsis protocol initiated -Patient had SBP <90/MAP <65 and so has received the 30 cc/kg IVF bolus. -Suspected source is likely aspiration PNA -Patient is at significant risk for aspiration given his malignancy -Imaging is c/w aspiration PNA -He needs to be strictly NPO (can start tube feedings when ok with surgery) -Will transfer to progressive care at this time -Will cover with Unasyn for now due to high suspicion for aspiration pneumonia -Will order lower respiratory tract procalcitonin level.   >0.5 indicates infection and >>0.5 indicates more serious disease.  As the procalcitonin level normalizes, it will be reasonable to consider de-escalation of antibiotic coverage.  The sensitivity of procalcitonin is variable and should not be used alone to guide treatment. -Blood cultures pending

## 2023-01-16 NOTE — Anesthesia Postprocedure Evaluation (Signed)
Anesthesia Post Note  Patient: Nathaniel Long  Procedure(s) Performed: INSERTION OF GASTROSTOMY TUBE (Left: Abdomen)     Patient location during evaluation: PACU Anesthesia Type: General Level of consciousness: awake Pain management: pain level controlled Vital Signs Assessment: post-procedure vital signs reviewed and stable Respiratory status: spontaneous breathing, nonlabored ventilation and respiratory function stable Cardiovascular status: blood pressure returned to baseline and stable Postop Assessment: no apparent nausea or vomiting Anesthetic complications: no   No notable events documented.  Last Vitals:  Vitals:   01/16/23 1000 01/16/23 1019  BP: 104/70 92/60  Pulse: (!) 109 (!) 108  Resp: (!) 28 (!) 28  Temp: 37.8 C 37.8 C  SpO2: 95% 95%    Last Pain:  Vitals:   01/16/23 1019  TempSrc: Oral  PainSc:                  Linton Rump

## 2023-01-16 NOTE — Significant Event (Signed)
Rapid Response Event Note   Reason for Call :  Return from G-tube placement, now lethargic and hypotensive  Initial Focused Assessment:  In bed opens eyes briefly to light stimulation. A&O x1 when questions written out--extremely HOH. Skin very warm to touch, blankets removed. Lungs course. Heart tones fast.   VS 77/56 (64) HR 108 RR 30 O2 97% 3L Temp 100.1 oral  Interventions:  Acetaminophen 1g IV (already given) CXR Labs 1L NS bolus Cardiac monitor Notify MD, CCS PA to bedside [facility] consult  Plan of Care:  Close monitoring of BP and oxygen status until transfer to PCU. BP improved upon completion of NS bolus.   VS 120/54 (73) HR 87 O2 96% 3L RR 28 100.7 rectal  Event Summary:  MD Notified: Pearletha Alfred MD, Barrie Dunker MD Call Time: 1019 Arrival Time: 1022 End Time: 1215  Truddie Crumble, RN

## 2023-01-16 NOTE — Assessment & Plan Note (Signed)
-  On Zetia as an outpatient, currently on hold -Also prescribed Livalo but he has not been taking statin therapy

## 2023-01-16 NOTE — Progress Notes (Signed)
Spoke with daughter in law at bedside and stated that pt told her that he wants a "chance" This nurse spoke with pt and he is A+Ox4. Dr. Ophelia Charter was informed and orders were placed for full code and DNR band was removed. Palliative and family have a meeting scheduled for tomorrow.

## 2023-01-16 NOTE — Op Note (Signed)
INSERTION OF GASTROSTOMY TUBE  Procedure Note  Nathaniel Long 01/19/2023   Pre-op Diagnosis: Pharyngeal cancer; Intolerance to PO intake     Post-op Diagnosis: same  Procedure(s): INSERTION OF GASTROSTOMY TUBE (18 french)  Surgeon(s): Abigail Miyamoto, MD  Anesthesia: General  Staff:  Circulator: Rogers Seeds, RN Scrub Person: Truddie Coco, RN; Carmela Rima  Estimated Blood Loss: Minimal               Indications: This is an 82 year old gentleman with a recurrent posterior oropharyngeal cancer.  He has already had radiation therapy.  Gastrostomy tube has been requested  Procedure: The patient was brought to the operating identified as a correct patient.  He was placed upon the operating table general esthesia was induced.  His abdomen was then prepped and draped in usual sterile fashion.  I anesthetized the skin in the upper midline with a mixture of Marcaine and Exparel.  I then made a small upper midline incision with a scalpel.  I took this down through the fascia with electrocautery and opened the peritoneum the entire length of the incision.  I then easily identified the stomach.  I grasped with 2 Babcock clamps.  I then placed a 2-0 silk pursestring suture along the anterior wall of the stomach near the greater curvature.  An 83 French gastrostomy tube was brought to the field.  It was flushed appropriately and the balloon was intact.  I made a separate skin incision the patient's left upper quadrant.  I then was able to place a gastrostomy tube through this opening.  I then created a an opening in the stomach at the pursestring suture with the cautery.  I placed the 25 French gastrostomy tube through this opening and insufflated the balloon with saline.  I then tied the pursing suture in place.  I placed another silk suture to tighten it further around the tube.  I then pulled the stomach up to the abdominal wall and sutured in place circumferentially around the tube with  interrupted 2-0 silk sutures.  I then secured the flange of the gastrostomy tube down.  I then closed the patient midline fascia with a running #1 suture.  I anesthetized the fascia further with Marcaine Exparel.  I then closed the subcutaneous tissue with interrupted 3-0 Vicryl sutures and closed the skin with a running 4-0 Monocryl.  Dermabond was then applied.  I secured the flange of the gastrostomy tube circumferentially with interrupted 2-0 silk sutures.  A binder was then placed.  The patient tolerated the procedure well.  All the counts were correct at the end of the procedure.  The patient was then extubated in the operating room and taken in stable condition to the recovery room.          Abigail Miyamoto   Date: 01/30/2023  Time: 8:20 AM

## 2023-01-16 NOTE — Progress Notes (Signed)
   01/16/23 1445  Spiritual Encounters  Type of Visit Follow up;Attempt (pt unavailable)  Conversation partners present during encounter Nurse  Referral source Nurse (RN/NT/LPN)  Reason for visit Advance directives  OnCall Visit No   Patient was sleeping and we did not wake.  Consulted with two nurses. Will return later today. Mark Lile-King and Dyane Dustman

## 2023-01-16 NOTE — Sepsis Progress Note (Signed)
Pt had antibiotics this am @ 0750, code sepsis called @ 1325

## 2023-01-16 NOTE — Progress Notes (Signed)
Overnight progress note  Hemoglobin 7.8> 7.1> 6.3.  Hemodynamically stable and no obvious bleeding reported by RN.  Type and screen, 1 unit PRBCs ordered after obtaining consent from the patient.  Follow-up posttransfusion H&H.

## 2023-01-16 NOTE — Progress Notes (Signed)
Subjective: Interacting appropriately with poor hearing.  No specific complaints.  Objective: Vital signs in last 24 hours: Temp:  [97 F (36.1 C)-102.7 F (39.3 C)] 97.8 F (36.6 C) (04/16 1312) Pulse Rate:  [80-117] 82 (04/16 1400) Resp:  [16-32] 19 (04/16 1400) BP: (65-150)/(35-96) 129/60 (04/16 1400) SpO2:  [92 %-100 %] 95 % (04/16 1400) Wt Readings from Last 1 Encounters:  01/15/23 62.5 kg    Intake/Output from previous day: 04/15 0701 - 04/16 0700 In: 1115.1 [I.V.:1115.1] Out: 570 [Urine:570] Intake/Output this shift: Total I/O In: 700 [I.V.:700] Out: 131 [Urine:130; Blood:1]  General appearance: alert, cooperative, and no distress Throat: no bleeding  Recent Labs    01/15/23 0603 01/16/23 1023  WBC 9.8 17.3*  HGB 7.8* 7.1*  HCT 24.3* 22.0*  PLT 435* 367    Recent Labs    01/16/23 1155  NA 134*  K 3.4*  CL 101  CO2 24  GLUCOSE 150*  BUN 14  CREATININE 1.25*  CALCIUM 7.3*    Medications: I have reviewed the patient's current medications.  Assessment/Plan: Pharyngeal wall cancer, malnutrition, sepsis  Appreciate rapid response and hospitalist services for assistance today.  Now being treated with Unasyn for presumed aspiration pneumonia.  Had G-tube placed this morning by General Surgery, appreciate assistance.  Biopsy demonstrates persistent cancer in posterior pharyngeal wall.  Will work toward discharge with functioning gastrostomy feedings for consideration of further palliative care by oncology.   LOS: 1 day   Christia Reading 01/16/2023, 3:55 PM

## 2023-01-16 NOTE — Progress Notes (Addendum)
MEWS Progress Note  Patient Details Name: Nathaniel Long MRN: [id number] DOB: [birth year] Today's Date: 01/16/2023   MEWS Flowsheet Documentation:  Assess: MEWS Score Temp: 98.2 F (36.8 C) BP: (!) 74/59 MAP (mmHg): 66 Pulse Rate: (!) 107 ECG Heart Rate: (!) 103 Resp: (!) 30 Level of Consciousness: Alert SpO2: 94 % O2 Device: Nasal Cannula O2 Flow Rate (L/min): 3 L/min Assess: MEWS Score MEWS Temp: 0 MEWS Systolic: 2 MEWS Pulse: 1 MEWS RR: 2 MEWS LOC: 0 MEWS Score: 5 MEWS Score Color: Red Assess: SIRS CRITERIA SIRS Temperature : 0 SIRS Respirations : 1 SIRS Pulse: 1 SIRS WBC: 0 SIRS Score Sum : 2 SIRS Temperature : 0 SIRS Pulse: 1 SIRS Respirations : 1 SIRS WBC: 0 SIRS Score Sum : 2 Assess: if the MEWS score is Yellow or Red Were vital signs taken at a resting state?: Yes Focused Assessment: Change from prior assessment (see assessment flowsheet) Does the patient meet 2 or more of the SIRS criteria?: No Does the patient have a confirmed or suspected source of infection?: No MEWS guidelines implemented : Yes, red Treat MEWS Interventions: Considered administering scheduled or prn medications/treatments as ordered Take Vital Signs Increase Vital Sign Frequency : Red: Q1hr x2, continue Q4hrs until patient remains green for 12hrs Escalate MEWS: Escalate: Red: Discuss with charge nurse and notify provider. Consider notifying RRT. If remains red for 2 hours consider need for higher level of care Notify: Charge Nurse/RN Name of Charge Nurse/RN Notified: NEil, RN Provider Notification Provider Name/Title: Jenne Pane, MD Date Provider Notified: 01/16/23 Time Provider Notified: [id number] Method of Notification: Page Notification Reason: Change in status Provider response: See new orders Date of Provider Response: 01/16/23 Time of Provider Response: 0949 Notify: Rapid Response Name of Rapid Response RN Notified: Dahlia Client, RN Date Rapid Response Notified: 01/16/23 Time Rapid  Response Notified: 1000    Patient returned from PEG tube placement febrile, tachycardic, labored breathing, lethargic, started IVF, gave IV tylenol and paged MD. Received orders for Cxr, blood cultures, CBC w/ diff. Patient then became hypotensive, paged rapid response nurse. Lorelle Formosa, RN came to bedside. Started 1 L bolus, put patient on telemetry and notified MD.    Malachi Carl 01/16/2023, 10:44 AM

## 2023-01-16 NOTE — Consult Note (Addendum)
Initial Consultation Note   Patient: Nathaniel Long ZOX:[id number] DOB: 11-03-1940 PCP: [name], MD DOA: 01/11/2023 DOS: the patient was seen and examined on 01/13/2023 Primary service: Jonah Blue, MD  Referring physician: Jenne Pane Reason for consult: Pharyngeal cancer, hasn't done well.  Back to OR yesterday, G-tube this AM.  Fever to 102 today, tachypnea, tachycardia.  RRT called.  Doing evaluation.  Surgery to see and [facility] consulted.  Goal was quality of life and palliation.      Assessment and Plan: * Sepsis due to pneumonia -SIRS criteria in this patient includes: Leukocytosis, fever, tachycardia, tachypnea  -Patient has evidence of acute organ failure with recurrent hypotension (SBP < 90 or MAP < 65 x 2 readings) that is not easily explained by another condition. -While awaiting blood cultures, this appears to be a preseptic condition. -Sepsis protocol initiated -Patient had SBP <90/MAP <65 and so has received the 30 cc/kg IVF bolus. -Suspected source is likely aspiration PNA -Patient is at significant risk for aspiration given his malignancy -Imaging is c/w aspiration PNA -He needs to be strictly NPO (can start tube feedings when ok with surgery) -Will transfer to progressive care at this time -Will cover with Unasyn for now due to high suspicion for aspiration pneumonia -Will order lower respiratory tract procalcitonin level.   >0.5 indicates infection and >>0.5 indicates more serious disease.  As the procalcitonin level normalizes, it will be reasonable to consider de-escalation of antibiotic coverage.  The sensitivity of procalcitonin is variable and should not be used alone to guide treatment. -Blood cultures pending  Goals of care, counseling/discussion -Complicated family dynamic with multiple people involved -Patient has expressed desire for DNR and wife was certain about this -He appears to have a terminal condition and has an ACP from 2005 regarding this -However,  family is uncertain about whether at this time he would prefer to transition to comfort and would like for him to improve enough to participate in conversation about it -For now, will attempt to treat the treatable - but transition to comfort measures is likely most appropriate -Palliative care consulted for ongoing discussion  Anemia -Baseline anemia, Hgb initially 7.8 -Current Hgb is 7.1, likely exacerbated by blood loss -Transfuse if Hgb <7 (unless GOC discussion with palliative care alters this plan) -Recheck CBC at 2000 and 0500  Protein-calorie malnutrition, severe -Recent severe weight loss due to inability to take PO -Albumin is 1.8 -Based on GOC discussion, he is likely to benefit from nutrition consult  Dyslipidemia -On Zetia as an outpatient, currently on hold -Also prescribed Livalo but he has not been taking statin therapy  Pharyngeal carcinoma -Diagnosed in 05/2022, deferred total laryngectomy and decided to do radiation for primarily palliative treatment -Worsening posterior neck pain, recent inability to tolerate PO -Underwent direct laryngoscopy yesterday and biopsies positive for cancer with perineural invasion -Poor prognosis -Transition to comfort measures would be reasonable but family is not quite there -He does not appear to be a candidate for further treatments at this time  COPD (chronic obstructive pulmonary disease) -On Stiolto as an outpatient -Currently on Brovana, Incruse, Xopenex -No longer smoking  CKD (chronic kidney disease) stage 3, GFR 30-59 ml/min -Appears to be stable at this time -Will follow -Attempt to avoid nephrotoxic medications       TRH will continue to follow the patient.  We will assume care and ENT will be a Research scientist (medical).    HPI: Nathaniel Long is a 82 y.o. male with past medical history of  lung cancer, COPD, HTN, HLD, and pharyngeal carcinoma treated with radiation.  He has had persistent throat pain and been unable to maintain  his weight and so was admitted yesterday for gastric tube placement.  PEG tube was considered but could not be placed due to anatomy.  He underwent direct laryngoscopy under anesthesia yesterday with findings of "Posterior pharyngeal wall fully necrotic with spine palpable in depth of necrosis. Extends into upper esophagus that could not be entered."  Biopsies were taken and show cancer.  He was using Coke for swish and spit yesterday and was attempting to take oral pills not very successfully.  He had gastrostomy tube placement this AM.  When he returned from the procedure, BPs were in the 70s for an hour, he was febrile to 102.  He has responded to IVF and BP is 120s.  The patient was somnolent throughout and really unable to answer questions.  His wife and ex-wife as well as his grandson were all present.  They clearly stated that he is DNR but were not comfortable answering further GOC questions and referred me to his daughter-in-law, Nathaniel Long.    I called and spoke with Nathaniel Long, his DIL and an Charity fundraiser.  He had 2 swallow evaluations in the fall, both negative.  Yesterday, she was concerned that he had aspirated.      Review of Systems: unable to review all systems due to the inability of the patient to answer questions. Past Medical History:  Diagnosis Date   Anxiety    Cancer    lung   Carotid artery stenosis    Cataract    Chronic lower back pain    CKD (chronic kidney disease)    Complication of anesthesia    "can't pee when I come out of OR" (07/18/2017)   Contact with chainsaw as cause of accidental injury 07/16/2017   sternal fracture with bilateral anterior fifth and sixth rib fractures /notes 07/16/2017   COPD (chronic obstructive pulmonary disease)    Dyspnea    GERD (gastroesophageal reflux disease)    Hard of hearing    High cholesterol    Hypertension    Peripheral vascular disease    Thoracic ascending aortic aneurysm    Past Surgical History:  Procedure Laterality Date    ABDOMINAL AORTOGRAM W/LOWER EXTREMITY N/A 05/01/2017   Procedure: Abdominal Aortogram w/Lower Extremity;  Surgeon:  Decamp, MD;  Location: [facility];  Service: Cardiovascular;  Laterality: N/A;   BACK SURGERY     DIRECT LARYNGOSCOPY Bilateral 01/08/2023   Procedure: DIRECT LARYNGOSCOPY WITH BIOPSY;  Surgeon: Christia Reading, MD;  Location: [facility] OR;  Service: ENT;  Laterality: Bilateral;   FINGER REPLANTATION Left    "ring finger"   HEMORRHOID SURGERY     IR FLUORO RM 30-60 MIN  01/12/2023   POSTERIOR LUMBAR FUSION  1986   RENAL ANGIOGRAPHY Bilateral 12/18/2017   Procedure: RENAL ANGIOGRAPHY;  Surgeon: Elder Negus, MD;  Location: [facility];  Service: Cardiovascular;  Laterality: Bilateral;   VIDEO ASSISTED THORACOSCOPY (VATS)/ LOBECTOMY Left 09/19/2017   Procedure: LEFT VIDEO ASSISTED THORACOSCOPY (VATS)/ LEFT UPPER LOBECTOMY, LYMPH NODE DISECTION;  Surgeon: Loreli Slot, MD;  Location: [facility] OR;  Service: Thoracic;  Laterality: Left;   VIDEO BRONCHOSCOPY WITH ENDOBRONCHIAL NAVIGATION N/A 09/05/2017   Procedure: VIDEO BRONCHOSCOPY WITH ENDOBRONCHIAL NAVIGATION;  Surgeon: Leslye Peer, MD;  Location: [facility] OR;  Service: Thoracic;  Laterality: N/A;   VIDEO BRONCHOSCOPY WITH ENDOBRONCHIAL ULTRASOUND N/A 09/05/2017  Procedure: VIDEO BRONCHOSCOPY WITH ENDOBRONCHIAL ULTRASOUND;  Surgeon: Leslye Peer, MD;  Location: [facility] OR;  Service: Thoracic;  Laterality: N/A;   Social History:  reports that he quit smoking about 5 years ago. His smoking use included cigarettes. He has a 33.00 pack-year smoking history. He has never used smokeless tobacco. He reports that he does not currently use alcohol. He reports that he does not use drugs.  Allergies  Allergen Reactions   Gabapentin Other (See Comments)    dizziness   Statins Other (See Comments)    Muscle weakness    Family History  Problem Relation Age of Onset   Heart disease Mother    Liver disease Father     Dementia Sister    Colon polyps Brother    Colon cancer Brother     Prior to Admission medications   Medication Sig Start Date End Date Taking? Authorizing Provider  aspirin EC 81 MG tablet Take 1 tablet (81 mg total) by mouth daily. Swallow whole. 04/26/21  Yes Cantwell, Celeste C, PA-C  lidocaine (LIDODERM) 5 % Place 1 patch onto the skin daily. Remove & Discard patch within 12 hours or as directed by MD 12/28/22  Yes Pickenpack-Cousar, Arty Baumgartner, NP  lidocaine (XYLOCAINE) 2 % solution Patient: Mix 1part 2% viscous lidocaine, 1part H20. Swish & swallow 10mL of diluted mixture, before meals and at bedtime, up to QID PRN soreness. 05/05/22  Yes Lonie Peak, MD  magic mouthwash (nystatin, lidocaine, diphenhydrAMINE, alum & mag hydroxide) suspension Swish and swallow 10 mLs 3 (three) times daily. 01/01/23  Yes Pickenpack-Cousar, Arty Baumgartner, NP  meloxicam (MOBIC) 7.5 MG tablet Take 1 tablet (7.5 mg total) by mouth daily. 12/28/22  Yes Pickenpack-Cousar, Arty Baumgartner, NP  oxyCODONE ER (XTAMPZA ER) 9 MG C12A Take 9 mg by mouth every 12 (twelve) hours. 12/28/22  Yes Pickenpack-Cousar, Arty Baumgartner, NP  pantoprazole (PROTONIX) 40 MG tablet TAKE 1 TABLET BY MOUTH ONCE DAILY 1/2 HOUR BEFORE A  MEAL Patient taking differently: Take 40 mg by mouth daily. TAKE 1 TABLET BY MOUTH ONCE DAILY 1/2 HOUR BEFORE A  MEAL 08/09/22  Yes  Decamp, MD  traMADol (ULTRAM) 50 MG tablet Take 1-2 tablets (50-100 mg total) by mouth every 6 (six) hours as needed for severe pain. 12/28/22  Yes Pickenpack-Cousar, Arty Baumgartner, NP  ALPRAZolam (XANAX) 0.5 MG tablet Take 1 tablet (0.5 mg total) by mouth at bedtime as needed for anxiety. Patient not taking: Reported on 01/12/2023 01/17/22    Decamp, MD  AMBULATORY NON FORMULARY MEDICATION L AFO for foot drop Dispense 1 Dx code: Z61.096 Use as needed 07/18/22   Rodolph Bong, MD  calcium carbonate (TUMS - DOSED IN MG ELEMENTAL CALCIUM) 500 MG chewable tablet Chew 1 tablet by mouth daily as needed  for indigestion or heartburn.    [provider]  ezetimibe (ZETIA) 10 MG tablet Take 5 mg by mouth daily. Patient not taking: Reported on 01/12/2023 08/19/18   [provider]  ibuprofen (ADVIL) 400 MG tablet Take 1 tablet (400 mg total) by mouth every 4 (four) hours as needed. Take only as needed for throat/neck pain following radiation, as this medication can affect your kidneys. Taper as your pain improves. Take with food. Patient not taking: Reported on 01/12/2023 07/05/22   Lonie Peak, MD  LIVALO 4 MG TABS TAKE 1/2TABLET DAILY Patient not taking: Reported on 01/12/2023 11/09/21   Rayford Halsted, PA-C  Multiple Vitamins-Minerals (CENTRUM PO) Take 1 tablet by mouth  every evening. Patient not taking: Reported on 01/12/2023    [provider]  polyethylene glycol (MIRALAX / GLYCOLAX) 17 g packet Take 17 g by mouth daily. Patient not taking: Reported on 01/12/2023 06/19/20   Maretta Bees, MD  Tiotropium Bromide-Olodaterol (STIOLTO RESPIMAT) 2.5-2.5 MCG/ACT AERS Inhale 2 puffs into the lungs daily. Patient not taking: Reported on 01/12/2023 12/11/19   Chilton Greathouse, MD  tiZANidine (ZANAFLEX) 2 MG tablet Take 1 tablet (2 mg total) by mouth every 8 (eight) hours as needed. Patient taking differently: Take 1 mg by mouth every 8 (eight) hours as needed for muscle spasms. 11/24/22   Rodolph Bong, MD    Physical Exam: Vitals:   01/29/2023 1205 01/03/2023 1216 01/08/2023 1230 01/11/2023 1312  BP: (!) 124/57 128/65 135/62 (!) 114/54  Pulse: 85 86 87 82  Resp:    16  Temp:    97.8 F (36.6 C)  TempSrc:    Oral  SpO2:  98% 100% 100%  Weight:      Height:       General:  Appears frail, chronically ill; significant audible rhonchi Eyes:  normal lids, mostly closed ENT:  grossly normal lips & tongue Neck:  no LAD, masses or thyromegaly Cardiovascular:  RRR, no m/r/g. No LE edema.  Respiratory:   Diffuse coarse rhonchi.  Mildly increased respiratory effort. Abdomen:   binder in place s/p recent G-tube placement Skin:  no rash or induration seen on limited exam Musculoskeletal:  grossly normal tone BUE/BLE, good ROM, no bony abnormality Psychiatric:  somnolent mood and affect Neurologic:  unable to effectively perform   Radiological Exams on Admission: Independently reviewed - see discussion in A/P where applicable  DG Chest Port 1 View  Result Date: 01/14/2023 CLINICAL DATA:  ARDS EXAM: PORTABLE CHEST 1 VIEW COMPARISON:  Chest radiograph 05/16/2021 and chest CT 01/06/2022 FINDINGS: Large volume pneumoperitoneum. Recent percutaneous gastrostomy tube. There is chronic volume loss and scarring in the left lung after resection but increased opacity at the left base. Left apical pleural based thickening which is chronic. Normal heart size. IMPRESSION: 1. Infiltrate at the left lung base, likely pneumonia or aspiration. 2. Recent percutaneous gastrostomy tube with large volume pneumoperitoneum. Electronically Signed   By: Tiburcio Pea M.D.   On: 01/13/2023 12:08    EKG: none   Labs on Admission: I have personally reviewed the available labs and imaging studies at the time of the admission.  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 Jenne Pane at the time of the consult request and also communicated with him about assuming care following the consult  Thank you very much for involving [place] in the care of your patient.    Total critical care time: 50 minutes Critical care time was exclusive of separately billable procedures and  treating other patients. Critical care was necessary to treat or prevent imminent or life-threatening deterioration. Critical care was time spent personally by me on the following activities: development of treatment plan with patient and/or surrogate as well as nursing, discussions with consultants, evaluation of patient's response to treatment, examination of patient,  obtaining history from patient or surrogate, ordering and performing treatments and interventions, ordering and review of laboratory studies, ordering and review of radiographic studies, pulse oximetry and re-evaluation of patient's condition.    Author: Jonah Blue, MD 01/07/2023 1:45 PM  For on call review [email or web address].

## 2023-01-16 NOTE — Sepsis Progress Note (Signed)
Elink monitoring for the code sepsis protocol.  

## 2023-01-16 NOTE — Progress Notes (Signed)
   01/16/23 7078  Spiritual Encounters  Type of Visit Attempt (pt unavailable)  Reason for visit Advance directives  OnCall Visit No   Visited pre-op per consult however - patient had procedure at 7 a.m. and currently is in recovery. Patient is being taken to [phone] - will refer for f/u after allotted recovery time.

## 2023-01-16 NOTE — Consult Note (Signed)
Palliative Care Consult Note                                  Date: 01/29/2023   Patient Name: Nathaniel Long  DOB: 15-Jan-1941  MRN: [id number]  Age / Sex: 82 y.o., male  PCP: [name], MD Referring Physician: Jonah Blue, MD  Reason for Consultation: {Reason for Consult:23484}  HPI/Patient Profile: 82 y.o. 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  Admitted on 01/27/2023 with ***.   Past Medical History:  Diagnosis Date   Anxiety    Cancer    lung   Carotid artery stenosis    Cataract    Chronic lower back pain    CKD (chronic kidney disease)    Complication of anesthesia    "can't pee when I come out of OR" (07/18/2017)   Contact with chainsaw as cause of accidental injury 07/16/2017   sternal fracture with bilateral anterior fifth and sixth rib fractures /notes 07/16/2017   COPD (chronic obstructive pulmonary disease)    Dyspnea    GERD (gastroesophageal reflux disease)    Hard of hearing    High cholesterol    Hypertension    Peripheral vascular disease    Thoracic ascending aortic aneurysm     Subjective:   I have reviewed medical records including progress notes, labs and imaging, discussed with admitting physician Dr. Ophelia Charter, and assessed the patient at bedside. He is resting/sleeping comfortable. RN reports when he is awake he is oriented to self and has not complained of pain today.   I spoke with daughter-in-law Nathaniel Long by phone to discuss diagnosis, prognosis, GOC, disposition, and options.  Patient is known to PMT provider Nathaniel Long at [facility]. I re-introduced Palliative Medicine as specialized medical care for people living with serious illness. It focuses on providing relief from the symptoms and stress of a serious illness.   We discussed patient's current illness and what it  means in the larger context of his/her ongoing co-morbidities. Current clinical status was reviewed. Natural disease trajectory of *** was discussed.  Created space and opportunity for patient and family to explore thoughts and feelings regarding current medical situation. Values and goals of care important to patient and family were attempted to be elicited.  Questions and concerns addressed. Patient/family encouraged to call with questions or concerns.     Life Review: Patient is a retired Curator. He has been married to Nathaniel Long for ~30 years. He has 2 biological children from a previous marriage, but 1 is estranged and the other lives in [place]). He also has 2 step-sons Nathaniel Long and Nathaniel Long) who live locally and are very involved.   Functional Status: Patient is ambulatory in the home but spends majority of his time in recliner or bed due to pain and fatigue.  His appetite has been poor due to ongoing throat and neck pain  Patient/Family Understanding of Illness: ***  Goals: Per Nathaniel Long, goal is to continue to treat the treatable and allowing the opportunity for improvement. She reports that patient/family pursued PEG tube to provide nutrition so that he would not "starve". They have been hopeful the PEG tube would help improve his quality of life.   Additional Discussion: ***  Review of Systems  Objective:   Primary Diagnoses: Present on Admission:  Pharyngeal carcinoma  Sepsis due to pneumonia  CKD (chronic kidney disease) stage 3, GFR 30-59 ml/min  Dyslipidemia  COPD (chronic obstructive pulmonary disease)  Protein-calorie malnutrition, severe  Anemia   Physical Exam  Vital Signs:  BP 129/60   Pulse 82   Temp 97.8 F (36.6 C) (Oral)   Resp 19   Ht 5' 9.5" (1.765 m)   Wt 62.5 kg   SpO2 95%   BMI 20.06 kg/m   Palliative Assessment/Data: ***     Assessment & Plan:   SUMMARY OF RECOMMENDATIONS   DNR/DNI as previously documented Continue current supportive  interventions Meet with family tomorrow at 27 am  Primary Decision Maker: Next of kin is his wife Nathaniel Long, but   Code Status/Advance Care Planning: {Palliative Code status:23503}  Symptom Management:  Morphine 2-4 mg IV every 2 hours as needed for severe pain Acetaminophen 1000 mg IV every 6 hours Lidocaine 5% 1 patch every 24 hours  Prognosis:  Unable to determine  Discharge Planning:  {Palliative dispostion:23505}   Discussed with: ***    Thank you for allowing [place] to participate in the care of [name]   Time Total: ***  Greater than 50%  of this time was spent counseling and coordinating care related to the above assessment and plan.  Signed by: Sherlean Foot, NP Palliative Medicine Team  Team Phone # [phone]  For individual providers, please see AMION

## 2023-01-16 NOTE — Progress Notes (Signed)
Report called to Randon Goldsmith

## 2023-01-16 NOTE — Progress Notes (Signed)
Patient ID: Nathaniel Long, male   DOB: 06-Oct-1940, 82 y.o.   MRN: [id number]   Pre Procedure note for inpatients:   Nathaniel Long has been scheduled for Procedure(s): INSERTION OF GASTROSTOMY TUBE (N/A) today. The various methods of treatment have been discussed with the patient. After consideration of the risks, benefits and treatment options the patient has consented to the planned procedure.   The patient has been seen and labs reviewed. There are no changes in the patient's condition to prevent proceeding with the planned procedure today.  Recent labs:  Lab Results  Component Value Date   WBC 9.8 01/15/2023   HGB 7.8 (L) 01/15/2023   HCT 24.3 (L) 01/15/2023   PLT 435 (H) 01/15/2023   GLUCOSE 113 (H) 01/12/2023   CHOL 114 07/26/2020   TRIG 123 07/26/2020   HDL 36 (L) 07/26/2020   LDLCALC 56 07/26/2020   ALT 11 01/12/2023   AST 12 (L) 01/12/2023   NA 132 (L) 01/12/2023   K 3.9 01/12/2023   CL 100 01/12/2023   CREATININE 1.20 01/12/2023   BUN 21 01/12/2023   CO2 23 01/12/2023   TSH 10.267 (H) 12/28/2022   INR 1.2 01/12/2023   HGBA1C 6.0 02/21/2012    Abigail Miyamoto, MD 01/16/2023 7:00 AM

## 2023-01-16 NOTE — Assessment & Plan Note (Signed)
-  Diagnosed in 05/2022, deferred total laryngectomy and decided to do radiation for primarily palliative treatment -Worsening posterior neck pain, recent inability to tolerate PO -Underwent direct laryngoscopy yesterday and biopsies positive for cancer with perineural invasion -Poor prognosis -Transition to comfort measures would be reasonable but family is not quite there -He does not appear to be a candidate for further treatments at this time

## 2023-01-16 NOTE — Anesthesia Procedure Notes (Signed)
Procedure Name: Intubation Date/Time: 01/16/2023 7:42 AM  Performed by: Randon Goldsmith, CRNAPre-anesthesia Checklist: Patient identified, Emergency Drugs available, Suction available and Patient being monitored Patient Re-evaluated:Patient Re-evaluated prior to induction Oxygen Delivery Method: Circle system utilized Preoxygenation: Pre-oxygenation with 100% oxygen Induction Type: IV induction Ventilation: Mask ventilation without difficulty Laryngoscope Size: Glidescope and 3 Grade View: Grade I Tube type: Oral Tube size: 6.5 mm Number of attempts: 1 Airway Equipment and Method: Stylet and Oral airway Placement Confirmation: ETT inserted through vocal cords under direct vision, positive ETCO2 and breath sounds checked- equal and bilateral Secured at: 21 cm Tube secured with: Tape Dental Injury: Teeth and Oropharynx as per pre-operative assessment  Comments: Cords edematous with no glottic opening, able to gently pass ETT through vocal cords. +BBS, +chest rise and fall, +EtCO2

## 2023-01-16 NOTE — Progress Notes (Signed)
Attempted to call report, 2c RN is unavailable to take report at this time. Left phone number with RN to call back for report.

## 2023-01-16 NOTE — Assessment & Plan Note (Signed)
-  Appears to be stable at this time -Will follow -Attempt to avoid nephrotoxic medications

## 2023-01-16 NOTE — Assessment & Plan Note (Signed)
-  Baseline anemia, Hgb initially 7.8 -Current Hgb is 7.1, likely exacerbated by blood loss -Transfuse if Hgb <7 (unless GOC discussion with palliative care alters this plan) -Recheck CBC at 2000 and 0500

## 2023-01-16 NOTE — Assessment & Plan Note (Signed)
-  Complicated family dynamic with multiple people involved -Patient has expressed desire for DNR and wife was certain about this -He appears to have a terminal condition and has an ACP from 2005 regarding this -However, family is uncertain about whether at this time he would prefer to transition to comfort and would like for him to improve enough to participate in conversation about it -For now, will attempt to treat the treatable - but transition to comfort measures is likely most appropriate -Palliative care consulted for ongoing discussion

## 2023-01-16 NOTE — Transfer of Care (Signed)
Immediate Anesthesia Transfer of Care Note  Patient: Nathaniel Long  Procedure(s) Performed: INSERTION OF GASTROSTOMY TUBE (Left: Abdomen)  Patient Location: PACU  Anesthesia Type:General  Level of Consciousness: drowsy and patient cooperative  Airway & Oxygen Therapy: Patient Spontanous Breathing and Patient connected to nasal cannula oxygen  Post-op Assessment: Report given to RN and Post -op Vital signs reviewed and stable  Post vital signs: Reviewed and stable  Last Vitals:  Vitals Value Taken Time  BP 111/96 01/16/23 0832  Temp    Pulse 121 01/16/23 0835  Resp 24 01/16/23 0835  SpO2 90 % 01/16/23 0835  Vitals shown include unvalidated device data.  Last Pain:  Vitals:   01/16/23 0514  TempSrc: Oral  PainSc:       Patients Stated Pain Goal: 4 (01/15/23 1610)  Complications: No notable events documented.

## 2023-01-16 NOTE — Assessment & Plan Note (Signed)
-  On Stiolto as an outpatient -Currently on Brovana, Incruse, Xopenex -No longer smoking

## 2023-01-17 ENCOUNTER — Inpatient Hospital Stay: Payer: Medicare Other | Admitting: Dietician

## 2023-01-17 ENCOUNTER — Inpatient Hospital Stay: Payer: Medicare Other

## 2023-01-17 ENCOUNTER — Encounter (HOSPITAL_COMMUNITY): Payer: Self-pay | Admitting: Surgery

## 2023-01-17 DIAGNOSIS — Z7189 Other specified counseling: Secondary | ICD-10-CM | POA: Diagnosis not present

## 2023-01-17 DIAGNOSIS — E43 Unspecified severe protein-calorie malnutrition: Secondary | ICD-10-CM | POA: Diagnosis not present

## 2023-01-17 DIAGNOSIS — J189 Pneumonia, unspecified organism: Secondary | ICD-10-CM | POA: Diagnosis not present

## 2023-01-17 DIAGNOSIS — C140 Malignant neoplasm of pharynx, unspecified: Secondary | ICD-10-CM

## 2023-01-17 DIAGNOSIS — Z515 Encounter for palliative care: Secondary | ICD-10-CM | POA: Diagnosis not present

## 2023-01-17 DIAGNOSIS — J42 Unspecified chronic bronchitis: Secondary | ICD-10-CM | POA: Diagnosis not present

## 2023-01-17 DIAGNOSIS — A419 Sepsis, unspecified organism: Secondary | ICD-10-CM

## 2023-01-17 DIAGNOSIS — D631 Anemia in chronic kidney disease: Secondary | ICD-10-CM

## 2023-01-17 DIAGNOSIS — N183 Chronic kidney disease, stage 3 unspecified: Secondary | ICD-10-CM | POA: Diagnosis not present

## 2023-01-17 LAB — BASIC METABOLIC PANEL
Anion gap: 10 (ref 5–15)
Anion gap: 9 (ref 5–15)
Anion gap: 8 (ref 5–15)
BUN: 12 mg/dL (ref 8–23)
BUN: 11 mg/dL (ref 8–23)
BUN: 13 mg/dL (ref 8–23)
CO2: 22 mmol/L (ref 22–32)
CO2: 25 mmol/L (ref 22–32)
CO2: 26 mmol/L (ref 22–32)
Calcium: 7.5 mg/dL — ABNORMAL LOW (ref 8.9–10.3)
Calcium: 7.7 mg/dL — ABNORMAL LOW (ref 8.9–10.3)
Calcium: 7.8 mg/dL — ABNORMAL LOW (ref 8.9–10.3)
Chloride: 105 mmol/L (ref 98–111)
Chloride: 104 mmol/L (ref 98–111)
Chloride: 103 mmol/L (ref 98–111)
Creatinine, Ser: 1.23 mg/dL (ref 0.61–1.24)
Creatinine, Ser: 1.08 mg/dL (ref 0.61–1.24)
Creatinine, Ser: 1.09 mg/dL (ref 0.61–1.24)
GFR, Estimated: 59 mL/min — ABNORMAL LOW (ref >60)
GFR, Estimated: >60 mL/min (ref >60)
GFR, Estimated: >60 mL/min (ref >60)
Glucose, Bld: 125 mg/dL — ABNORMAL HIGH (ref 70–99)
Glucose, Bld: 116 mg/dL — ABNORMAL HIGH (ref 70–99)
Glucose, Bld: 139 mg/dL — ABNORMAL HIGH (ref 70–99)
Potassium: 3.4 mmol/L — ABNORMAL LOW (ref 3.5–5.1)
Potassium: 3 mmol/L — ABNORMAL LOW (ref 3.5–5.1)
Potassium: 3.3 mmol/L — ABNORMAL LOW (ref 3.5–5.1)
Sodium: 137 mmol/L (ref 135–145)
Sodium: 138 mmol/L (ref 135–145)
Sodium: 137 mmol/L (ref 135–145)

## 2023-01-17 LAB — TYPE AND SCREEN
ABO/RH(D): O POS
Antibody Screen: NEGATIVE
Unit division: 0

## 2023-01-17 LAB — BPAM RBC

## 2023-01-17 LAB — CBC WITH DIFFERENTIAL/PLATELET
Abs Immature Granulocytes: 0.03 10*3/uL (ref 0.00–0.07)
Basophils Absolute: 0 10*3/uL (ref 0.0–0.1)
Basophils Relative: 0 %
Eosinophils Absolute: 0 10*3/uL (ref 0.0–0.5)
Eosinophils Relative: 0 %
HCT: 21.3 % — ABNORMAL LOW (ref 39.0–52.0)
Hemoglobin: 6.7 g/dL — CL (ref 13.0–17.0)
Immature Granulocytes: 0 %
Lymphocytes Relative: 1 %
Lymphs Abs: 0.2 10*3/uL — ABNORMAL LOW (ref 0.7–4.0)
MCH: 29.8 pg (ref 26.0–34.0)
MCHC: 31.5 g/dL (ref 30.0–36.0)
MCV: 94.7 fL (ref 80.0–100.0)
Monocytes Absolute: 0.3 10*3/uL (ref 0.1–1.0)
Monocytes Relative: 2 %
Neutro Abs: 12.8 10*3/uL — ABNORMAL HIGH (ref 1.7–7.7)
Neutrophils Relative %: 97 %
Platelets: 328 10*3/uL (ref 150–400)
RBC: 2.25 MIL/uL — ABNORMAL LOW (ref 4.22–5.81)
RDW: 14 % (ref 11.5–15.5)
WBC: 13.3 10*3/uL — ABNORMAL HIGH (ref 4.0–10.5)
nRBC: 0 % (ref 0.0–0.2)

## 2023-01-17 LAB — HEMOGLOBIN AND HEMATOCRIT, BLOOD
HCT: 24.2 % — ABNORMAL LOW (ref 39.0–52.0)
Hemoglobin: 8.1 g/dL — ABNORMAL LOW (ref 13.0–17.0)

## 2023-01-17 LAB — GLUCOSE, CAPILLARY
Glucose-Capillary: 102 mg/dL — ABNORMAL HIGH (ref 70–99)
Glucose-Capillary: 103 mg/dL — ABNORMAL HIGH (ref 70–99)
Glucose-Capillary: 121 mg/dL — ABNORMAL HIGH (ref 70–99)
Glucose-Capillary: 130 mg/dL — ABNORMAL HIGH (ref 70–99)
Glucose-Capillary: 132 mg/dL — ABNORMAL HIGH (ref 70–99)

## 2023-01-17 LAB — CULTURE, BLOOD (ROUTINE X 2): Culture: NO GROWTH

## 2023-01-17 LAB — MAGNESIUM
Magnesium: 1.8 mg/dL (ref 1.7–2.4)
Magnesium: 1.8 mg/dL (ref 1.7–2.4)

## 2023-01-17 LAB — PHOSPHORUS
Phosphorus: 3 mg/dL (ref 2.5–4.6)
Phosphorus: 2.2 mg/dL — ABNORMAL LOW (ref 2.5–4.6)

## 2023-01-17 MED ORDER — AMLODIPINE BESYLATE 5 MG PO TABS
5.0000 mg | ORAL_TABLET | Freq: Every day | ORAL | Status: DC
  Administered 2023-01-17 – 2023-01-20 (×4): 5 mg
  Filled 2023-01-17 (×4): qty 1

## 2023-01-17 MED ORDER — OSMOLITE 1.5 CAL PO LIQD
1000.0000 mL | ORAL | Status: DC
  Administered 2023-01-17 – 2023-01-21 (×3): 1000 mL

## 2023-01-17 MED ORDER — THIAMINE MONONITRATE 100 MG PO TABS
100.0000 mg | ORAL_TABLET | Freq: Every day | ORAL | Status: AC
  Administered 2023-01-17 – 2023-01-21 (×5): 100 mg
  Filled 2023-01-17 (×5): qty 1

## 2023-01-17 MED ORDER — POTASSIUM CHLORIDE 10 MEQ/100ML IV SOLN
10.0000 meq | INTRAVENOUS | Status: AC
  Administered 2023-01-17 (×4): 10 meq via INTRAVENOUS
  Filled 2023-01-17 (×4): qty 100

## 2023-01-17 MED ORDER — LACTATED RINGERS IV SOLN: INTRAVENOUS | Status: DC

## 2023-01-17 MED ORDER — FREE WATER
100.0000 mL | Freq: Three times a day (TID) | Status: DC
  Administered 2023-01-17 – 2023-01-22 (×15): 100 mL

## 2023-01-17 NOTE — Progress Notes (Signed)
Initial Nutrition Assessment  DOCUMENTATION CODES:   Severe malnutrition in context of chronic illness  INTERVENTION:   Initiate continuous tube feeds via G-tube: - Start Osmolite 1.5 @ 20 ml/hr and advance rate by 10 ml q 12 hours to goal rate of 50 ml/hr (1200 ml/day) - Free water flushes of 100 ml q 8 hours (RD to increase flushes after IV fluids are discontinued)  Continuous tube feeding regimen at goal rate provides 1800 kcal, 75 grams of protein, and 914 ml of H2O.  Total free water with flushes: 1214 ml  Monitor magnesium, potassium, and phosphorus q 12 hours for at least 6 occurrences. MD to replete as needed as pt is at risk for refeeding syndrome given severe malnutrition, inadequate nutrition for >1 week.  - Thiamine 100 mg daily per tube x 5 days due to refeeding risk   Once pt tolerating continuous tube feeds at goal rate via G-tube and electrolytes are stable, recommend transitioning to bolus tube feeding regimen in preparation for discharge: - 1 carton (237 ml) of Osmolite 1.5 cal formula 5 x daily - Flush G-tube with 60 ml free water before and after each bolus  Bolus tube feeding regimen with free water flushes will provide 1775 kcal, 74.5 grams of protein, and 905 ml of H2O.  Total free water with flushes: 1505 ml  NUTRITION DIAGNOSIS:   Severe Malnutrition related to chronic illness (pharyngeal cancer, COPD) as evidenced by severe fat depletion, severe muscle depletion, percent weight loss (18.9% weight loss in less than 4 months).  GOAL:   Patient will meet greater than or equal to 90% of their needs  MONITOR:   Labs, Diet advancement, Weight trends, TF tolerance  REASON FOR ASSESSMENT:   Consult Enteral/tube feeding initiation and management  ASSESSMENT:   82 year old male who presented on 4/15 for surgical G-tube placement. PMH of pharyngeal cancer s/p radiation, CKD stage III, COPD, GERD, HTN, PVD, CAD, thoracic ascending aortic aneurysm, LUL  lung cancer (2018). Pt admitted after G-tube placement with sepsis due to pneumonia.  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 Pt was evaluated by IR for PEG placement 4/12, but this was unable to be done due to location of colon anterior to stomach. Pt had open G-tube placed by Dr. Magnus Ivan yesterday. G-tube has been cleared for use by Surgery.  Pt has been [facility] team. Reviewed notes. Pt last seen by [facility] on 01/05/23. At that time, pt had been consuming 1-2 Ensure supplements daily.  Spoke with pt and daughter-in-law [name] at bedside. Pt is very hard of hearing and requires questions/responses to be written out for him via whiteboard. Majority of diet and weight history obtained from [place].  [name] reports that pt has been having increased difficulty with PO intake due to pain with swallowing. [name] shares that pt has had 2 swallow studies, both of which did not demonstrate aspiration. [name] is concerned that pt has recently been aspirating.  Previously, pt had been consuming 3-4 Ensure supplements daily to meet nutritional needs. Pt cannot tolerate chocolate flavor due to "chemical" causing pain with swallowing. In mid-March, pt's swallowing became more painful, and PO intake decreased from 3-4 Ensure supplements daily down to 2 Ensure supplements daily then down further to 1 Ensure supplement daily. The last time pt had  something to eat or drink was when he had jello on Monday night (day prior to procedure).  Pt also with significant phlegm which, along with pain with swallowing, which inhibits his PO intake. Pt tries to "cut" the phlegm using coke or other soda. Pt has been dipping his mouth swabs in coke to help with his  phlegm.  [name] shares that pt used to weigh 180 lbs. Pt lost weight down to 158 lbs then gained some weight back up to 164 lbs. Recently, pt has lost weight further down to 135 lbs. Reviewed weight history in chart. Pt with a 14.6 kg (32.12 lb) weight loss since 09/29/22. This is an 18.9% weight loss in less than 4 months which is severe and significant for timeframe. Based on weight loss and NFPE, pt meets criteria for severe malnutrition.  Pt at very high risk for refeeding syndrome. Refeeding labs have been ordered and MD supplementing hypokalemia. Discussed with pt and [name] plan to start at trickle rate and slowly advance to goal rate. Once pt tolerating tube feeds at goal rate, will transition to bolus regimen for discharge. Also discussed with RN.  Medications reviewed and include: IV abx, IV KCl 10 mEq x 4 IVF: LR @ 100 ml/hr  Labs reviewed: potassium 3.0, WBC 13.3, hemoglobin 8.1 CBG's: 102-135 x 24 hours  UOP: 3930 ml x 24 hours I/O's: +3.1 L since admit  NUTRITION - FOCUSED PHYSICAL EXAM:  Flowsheet Row Most Recent Value  Orbital Region Severe depletion  Upper Arm Region Moderate depletion  Thoracic and Lumbar Region Severe depletion  Buccal Region Moderate depletion  Temple Region Moderate depletion  Clavicle Bone Region Severe depletion  Clavicle and Acromion Bone Region Severe depletion  Scapular Bone Region Moderate depletion  Dorsal Hand Moderate depletion  Patellar Region Severe depletion  Anterior Thigh Region Severe depletion  Posterior Calf Region Severe depletion  Edema (RD Assessment) None  Hair Reviewed  Eyes Reviewed  Mouth Reviewed  Skin Reviewed  Nails Reviewed       Diet Order:   Diet Order             Diet NPO time specified  Diet effective now                   EDUCATION NEEDS:   Education needs have been addressed  Skin:  Skin Assessment: Skin Integrity Issues: Incisions: abdomen  Last BM:  no documented BM  Height:    Ht Readings from Last 1 Encounters:  01/13/2023 5' 9.5" (1.765 m)    Weight:   Wt Readings from Last 1 Encounters:  01/13/2023 62.5 kg    Ideal Body Weight:  74.1 kg  BMI:  Body mass index is 20.06 kg/m.  Estimated Nutritional Needs:   Kcal:  1750-1950  Protein:  75-90 grams  Fluid:  1.7-1.9 L    Mertie Clause, MS, RD, LDN Inpatient Clinical Dietitian Please see AMiON for contact information.

## 2023-01-17 NOTE — Progress Notes (Signed)
Palliative Medicine Progress Note   Patient Name: Nathaniel Long       Date: 01/17/2023 DOB: 26-Feb-1941  Age: 82 y.o. MRN#: [id number] Attending Physician: Cathren Harsh, MD Primary Care Physician: [name], MD Admit Date: 01/24/2023    HPI/Patient Profile: 82 y.o. 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 Due to weight loss and ongoing throat pain, he had been recommended for G-tube placement. He was evaluated by IR on 4/12, but G-tube placement was not to be done due to his anatomy.   He was admitted to [facility] on 01/07/2023 direct laryngoscopy with biopsy. Found to have full necrotic posterior pharyngeal wall with palpable cervical spine.  On 4/16, he went to the OR for G-tube placement.    Palliative Medicine has been consulted for goals of care.   Subjective: Chart reviewed. Patient sleeps throughout much of my visit, but intermittently awakens and is quite adamant that he is going home today.   I met at bedside today with his daughter-in-law Nathaniel Long. She confirms that patient rescinded DNR status yesterday evening,  he "wants a chance". Education offered on evidenced based poor outcomes in similar hospitalized patients, as the cause of the arrest would most likely be associated with chronic/terminal disease rather than a reversible acute cardio-pulmonary event. Nathaniel Long agrees that DNR status is appropriate, but supports patient's wish to remain full code at this time.   Discussed the importance of continued conversation with patient, family, and the medical team regarding overall plan of care and treatment options, ensuring decisions are within the context of the  patients values and GOCs.    Objective:  Physical Exam Vitals reviewed.  Constitutional:      General: He is sleeping. He is not in acute distress.    Appearance: He is ill-appearing.  HENT:     Head:     Comments: Hard of hearing Pulmonary:     Effort: Pulmonary effort is normal.             Palliative Medicine Assessment & Plan   Assessment: Principal Problem:   Sepsis due to pneumonia Active Problems:   CKD (chronic kidney disease) stage 3, GFR 30-59 ml/min   COPD (chronic obstructive pulmonary disease)  Pharyngeal carcinoma   Dyslipidemia   Protein-calorie malnutrition, severe   Anemia   Goals of care, counseling/discussion    Recommendations/Plan: Continue current supportive interventions - treat the treatable Appreciate spiritual care assistance with advanced directives Continue outpatient palliative at discharge Patient will need Rx for pain meds at discharge (see below)  Symptom Management:  Xtampza ER 9 mg every 12 hours (can be given per tube) Tramadol 50 mg 1-2 tablets every 6 hours as needed for breakthrough pain Start senna 1-2 tablets nightly   Code Status: Full  Prognosis:  < 6 months would not be surprising    Thank you for allowing the Palliative Medicine Team to assist in the care of this patient.   MDM - High   Merry Proud, NP   Please contact Palliative Medicine Team phone at [phone] for questions and concerns.  For individual providers, please see AMION.

## 2023-01-17 NOTE — Progress Notes (Signed)
[facility]                                                                              Nathaniel Long, is a 82 y.o. male, DOB - 02/14/1941, JKK:[id number] Admit date - 01/18/2023    Outpatient Primary MD for the patient is [name], MD  LOS - 2  days  No chief complaint on file.      Brief summary   Patient is a 82 y.o. male with lung cancer, COPD, HTN, HLD, and pharyngeal carcinoma treated with radiation.  He has had persistent throat pain and been unable to maintain his weight and so was admitted on 01/21/2023 for gastric tube placement.  PEG tube was considered but could not be placed due to anatomy.  He underwent direct laryngoscopy under anesthesia yesterday with findings of "Posterior pharyngeal wall fully necrotic with spine palpable in depth of necrosis. Extends into upper esophagus that could not be entered."  Biopsies were taken and show cancer.  He was using Coke for swish and spit yesterday and was attempting to take oral pills not very successfully.  He had gastrostomy tube placement on 4/16.  When he returned from the procedure, BPs were in the 70s for an hour, he was febrile to 102, responded to IV fluids.    Assessment & Plan    Principal Problem:   Sepsis due to pneumonia, ?  Aspiration -Patient met sepsis criteria due to leukocytosis, fever, tachypnea, tachycardia, hypotension.  Possibly due to aspiration pneumonia -G-tube placed on 4/16, surgery following -Continue n.p.o., IV Unasyn, IV fluids -Will place on tube feeds once cleared by surgery for G-tube to be used -Follow blood cultures   Active problems Pharyngeal carcinoma -Diagnosed in 05/2022, deferred total laryngectomy and decided to do radiation for primarily palliative treatment -Worsening posterior neck pain, recent inability to tolerate PO -Underwent direct laryngoscopy on 4/15 and biopsies positive for cancer with perineural invasion -Overall poor prognosis, awaiting palliative  medicine GOC    Normocytic anemia  -Overnight trended down to 6.3, received 1 unit packed RBCs -Hemoglobin 8.1, no ongoing bleeding   Protein-calorie malnutrition, severe -Recent severe weight loss due to inability to take PO -Albumin is 1.8 -GOC pending, however once cleared by surgery to use G-tube, will obtain dietitian consult for tube feeds   Dyslipidemia -On Zetia as an outpatient, currently on hold -Has not been taking statins    COPD (chronic obstructive pulmonary disease) -On Stiolto as an outpatient -Currently on Brovana, Incruse, Xopenex -No longer smoking   CKD (chronic kidney disease) stage 3, GFR 30-59 ml/min -Appears to be stable at this time, at baseline 1.0-1.2   Estimated body mass index is 20.06 kg/m as calculated from the following:   Height as of this encounter: 5' 9.5" (1.765 m).   Weight as of this encounter: 62.5 kg.  Code Status: Full code DVT Prophylaxis:  SCDs Start: 01/21/2023 1039   Level of Care: Level of care: Progressive Family Communication:  Disposition Plan:      Remains inpatient appropriate: Will plan on disposition once  surgery clears to start tube feeds   Procedures:  Insertion of  gastrostomy tube on 4/16 Direct laryngoscopy with biopsy on 4/15  Consultants:   General surgery ENT  Antimicrobials:   Anti-infectives (From admission, onward)    Start     Dose/Rate Route Frequency Ordered Stop   01/20/2023 1430  Ampicillin-Sulbactam (UNASYN) 3 g in sodium chloride 0.9 % 100 mL IVPB        3 g 200 mL/hr over 30 Minutes Intravenous Every 8 hours 01/10/2023 1333     01/01/2023 0600  ceFAZolin (ANCEF) IVPB 2g/100 mL premix        2 g 200 mL/hr over 30 Minutes Intravenous On call to O.R. 01/13/2023 1514 01/02/2023 0800          Medications  arformoterol  15 mcg Nebulization BID   And   umeclidinium bromide  1 puff Inhalation Daily   lidocaine  1 patch Transdermal Q24H   mupirocin ointment  1 Application Nasal BID       Subjective:   Nathaniel Long was seen and examined today.  Somewhat sleepy, no acute complaints.  No acute issues overnight.  No nausea vomiting, abdominal pain, fevers or chills.  No acute shortness of breath.   Objective:   Vitals:   01/17/23 0409 01/17/23 0800 01/17/23 0936 01/17/23 0943  BP: (!) 163/65 (!) 158/74    Pulse: 89 87    Resp: (!) 22 19    Temp: (!) 97.4 F (36.3 C) 98.4 F (36.9 C)    TempSrc: Axillary Axillary    SpO2: 91% 93% 94% 99%  Weight:      Height:        Intake/Output Summary (Last 24 hours) at 01/17/2023 0955 Last data filed at 01/17/2023 6644 Gross per 24 hour  Intake 2942.48 ml  Output 3800 ml  Net -857.52 ml     Wt Readings from Last 3 Encounters:  01/13/2023 62.5 kg  01/12/23 62 kg  01/12/23 62.4 kg     Exam General: Somewhat sleepy, NAD Cardiovascular: S1 S2 auscultated,  RRR Respiratory: Clear to auscultation bilaterally Gastrointestinal:  G-tube, abdominal binder + Ext: no pedal edema bilaterally Neuro: no new deficits Psych:     Data Reviewed:  I have personally reviewed following labs    CBC Lab Results  Component Value Date   WBC 13.3 (H) 01/09/2023   RBC 2.25 (L) 01/17/2023   HGB 8.1 (L) 01/17/2023   HCT 24.2 (L) 01/17/2023   MCV 94.7 01/04/2023   MCH 29.8 01/21/2023   PLT 328 01/09/2023   MCHC 31.5 01/04/2023   RDW 14.0 01/14/2023   LYMPHSABS 0.2 (L) 01/26/2023   MONOABS 0.3 01/02/2023   EOSABS 0.0 01/26/2023   BASOSABS 0.0 01/07/2023     Last metabolic panel Lab Results  Component Value Date   NA 138 01/17/2023   K 3.0 (L) 01/17/2023   CL 104 01/17/2023   CO2 25 01/17/2023   BUN 11 01/17/2023   CREATININE 1.08 01/17/2023   GLUCOSE 116 (H) 01/17/2023   GFRNONAA >60 01/17/2023   GFRAA 50 (L) 06/19/2020   CALCIUM 7.7 (L) 01/17/2023   PHOS 3.3 01/12/2023   PROT 5.0 (L) 01/19/2023   ALBUMIN 1.8 (L) 01/19/2023   BILITOT 1.1 01/04/2023   ALKPHOS 57 01/19/2023   AST 29 01/11/2023   ALT 14  01/13/2023   ANIONGAP 9 01/17/2023    CBG (last 3)  Recent Labs    01/07/2023 1630 01/05/2023 2134 01/17/23 0609  GLUCAP 135* 110* 102*      Coagulation Profile: Recent Labs  Lab 01/12/23 1000  INR 1.2     Radiology Studies: I have personally reviewed the imaging studies  DG Chest Port 1 View  Result Date: 01/05/2023 CLINICAL DATA:  ARDS EXAM: PORTABLE CHEST 1 VIEW COMPARISON:  Chest radiograph 05/16/2021 and chest CT 01/06/2022 FINDINGS: Large volume pneumoperitoneum. Recent percutaneous gastrostomy tube. There is chronic volume loss and scarring in the left lung after resection but increased opacity at the left base. Left apical pleural based thickening which is chronic. Normal heart size. IMPRESSION: 1. Infiltrate at the left lung base, likely pneumonia or aspiration. 2. Recent percutaneous gastrostomy tube with large volume pneumoperitoneum. Electronically Signed   By: Tiburcio Pea M.D.   On: 01/14/2023 12:08       Nathaniel Long M.D. [facility] 01/17/2023, 9:55 AM  Available via Epic secure chat 7am-7pm After 7 pm, please refer to night coverage provider listed on amion.

## 2023-01-17 NOTE — TOC Initial Note (Addendum)
Transition of Care [facility]) - Initial/Assessment Note    Patient Details  Name: Nathaniel Long MRN: [id number] Date of Birth: [birth year]  [facility]) CM/SW Contact:    Harriet Masson, RN Phone Number: 01/17/2023, 2:54 PM  Clinical Narrative:                 Spoke daughter in law, [name] (is a Charity fundraiser), and patient at bedside.  [name] will be doing the tube feeds. Patient would like to discharge home with health health.  Patient has used [facility] in the past and would like to use them again.  Morrie Sheldon with [facility] accepted referral. Pam with [facility] notified of tube feed needs.  Patient requesting suction machine for home.   TOC will continue to follow for needs.   Expected Discharge Plan: Home w Home Health Services Barriers to Discharge: Continued Medical Work up   Patient Goals and CMS Choice Patient states their goals for this hospitalization and ongoing recovery are:: return home CMS Medicare.gov Compare Post Acute Care list provided to:: Patient Choice offered to / list presented to : Patient      Expected Discharge Plan and Services   Discharge Planning Services: CM Consult   Living arrangements for the past 2 months: Single Family Home                 DME Arranged: Tube feeding DME Agency: Other - Comment Opal Sidles) Date DME Agency Contacted: 01/17/23 Time DME Agency Contacted: [phone] Representative spoke with at DME Agency: Pam            Prior Living Arrangements/Services Living arrangements for the past 2 months: Single Family Home Lives with:: Spouse, Adult Children Patient language and need for interpreter reviewed:: Yes Do you feel safe going back to the place where you live?: Yes      Need for Family Participation in Patient Care: Yes (Comment) Care giver support system in place?: Yes (comment)   Criminal Activity/Legal Involvement Pertinent to Current Situation/Hospitalization: No - Comment as needed  Activities of Daily  Living Home Assistive Devices/Equipment: Eyeglasses, Cane (specify quad or straight), Walker (specify type), Wheelchair ADL Screening (condition at time of admission) Patient's cognitive ability adequate to safely complete daily activities?: Yes Is the patient deaf or have difficulty hearing?: Yes Does the patient have difficulty seeing, even when wearing glasses/contacts?: No Does the patient have difficulty concentrating, remembering, or making decisions?: No Patient able to express need for assistance with ADLs?: Yes Does the patient have difficulty dressing or bathing?: No Independently performs ADLs?: Yes (appropriate for developmental age) Does the patient have difficulty walking or climbing stairs?: Yes Weakness of Legs: None Weakness of Arms/Hands: None  Permission Sought/Granted                  Emotional Assessment Appearance:: Appears stated age Attitude/Demeanor/Rapport: Gracious Affect (typically observed): Accepting Orientation: : Oriented to Self, Oriented to Place, Oriented to Situation, Oriented to  Time Alcohol / Substance Use: Not Applicable Psych Involvement: No (comment)  Admission diagnosis:  Pharyngeal carcinoma [C14.0] Patient Active Problem List   Diagnosis Date Noted   Sepsis due to pneumonia 01/08/2023   Dyslipidemia 01/06/2023   Protein-calorie malnutrition, severe 01/12/2023   Anemia 01/25/2023   Goals of care, counseling/discussion 01/05/2023   Pharyngeal carcinoma 01/26/2023   Encounter for preoperative dental examination 04/17/2022   Teeth missing 04/17/2022   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   Accretions on teeth 04/17/2022   Generalized gingival recession 04/17/2022  Excessive dental attrition 04/17/2022   Cancer of posterior pharynx 03/28/2022   Hematoma of right ankle 10/28/2021   GI bleed 06/18/2020   COVID-19 virus infection 06/18/2020   Abdominal pain 06/18/2020   CKD (chronic kidney disease) stage 3, GFR 30-59 ml/min  06/18/2020   COPD (chronic obstructive pulmonary disease) 06/18/2020   Hard of hearing 06/18/2020   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   Hydropneumothorax 09/28/2017   S/P lobectomy of lung 09/19/2017   Malignant neoplasm of upper lobe of left lung 09/13/2017   Pulmonary nodule, left 09/05/2017   Mediastinal lymphadenopathy 09/05/2017   Multiple rib fractures 07/16/2017   Spondylolisthesis, lumbar region 06/18/2017   Right hip pain 05/23/2017   Chronic bilateral low back pain with bilateral sciatica 05/23/2017   Colon cancer screening 01/12/2017   Antiplatelet or antithrombotic long-term use 01/12/2017   PAD (peripheral artery disease) (HCC) 04/13/2011   Murmur, heart 12/08/2010   ACTINIC KERATOSIS, HEAD 07/14/2010   BRUISED ANKLE 07/14/2010   HYPERTENSION, BENIGN 11/19/2009   TOBACCO ABUSE 10/28/2009   EAR PAIN, LEFT 10/28/2009   PCP:  [name], MD Pharmacy:   [facility]) - [place], [place] - [address] [place] [address] Phone: [phone] Fax: [phone]  [facility] - [place], [place] - [address] [employer] [address] [place] [address] Phone: [phone] Fax: [phone]  [id number] [place], [place] - [address] [place] [address] Phone: [phone] Fax: [phone]  [facility] - [place], [place] - [address] [place] [address] Phone: [phone] Fax: [phone]     Social Determinants of Health (SDOH) Social History: SDOH Screenings   Food Insecurity: Food Insecurity Present (01/14/2023)  Housing: Low Risk  (01/04/2023)  Transportation Needs: No Transportation Needs (01/10/2023)  Utilities: At Risk (01/18/2023)  Financial Resource Strain: Low Risk  (10/03/2017)   Physical Activity: Inactive (10/03/2017)  Social Connections: Somewhat Isolated (10/03/2017)  Stress: No Stress Concern Present (10/03/2017)  Tobacco Use: Medium Risk (01/17/2023)   SDOH Interventions:     Readmission Risk Interventions     No data to display

## 2023-01-17 NOTE — Progress Notes (Unsigned)
Oncology Nurse Navigator Documentation   Request made to Pathology to add PDL1 testing to his biopsy on 01/15/23. I received confirmation of request by Lita Mains in Pathology.   Hedda Slade RN, BSN, OCN Head & Neck Oncology Nurse Navigator [facility] Phone # [phone]  Fax # [phone]

## 2023-01-17 NOTE — Progress Notes (Signed)
   01/17/23 1142  Spiritual Encounters  Type of Visit Follow up  Care provided to: Pt and family  Conversation partners present during encounter Nurse  Referral source Nurse (RN/NT/LPN)  Reason for visit Advance directives  OnCall Visit No  Spiritual Framework  Needs/Challenges/Barriers patient does not accept diagnosis  Advance Directives (For Healthcare)  Does Patient Have a Medical Advance Directive? Yes  Does patient want to make changes to medical advance directive? Yes (Inpatient - patient defers changing a medical advance directive at this time - Information given)   F/U visited patient and daughter in law. Patient has difficulty hearing. Uses writing board and reads. Patient familiar with medial POA, just wants to change/update. Left forms with patient and daughter in law as they will complete later after they talk with palliative care. Patient insist he is being discharged today and only has pneumonia. Daughter in law tried to explain however patient disagrees.

## 2023-01-17 NOTE — Progress Notes (Signed)
Subjective: Doing well.  No specific complaints.  Objective: Vital signs in last 24 hours: Temp:  [97.4 F (36.3 C)-100.7 F (38.2 C)] 98.4 F (36.9 C) (04/17 0800) Pulse Rate:  [78-109] 87 (04/17 0800) Resp:  [16-32] 19 (04/17 0800) BP: (65-163)/(35-74) 158/74 (04/17 0800) SpO2:  [91 %-100 %] 99 % (04/17 0943) Wt Readings from Last 1 Encounters:  01/15/23 62.5 kg    Intake/Output from previous day: 04/16 0701 - 04/17 0700 In: 3642.5 [I.V.:1018.4; Blood:376; IV Piggyback:2248.1] Out: 3931 [Urine:3930; Blood:1] Intake/Output this shift: No intake/output data recorded.  General appearance: alert, cooperative, and no distress Throat: no bleeding  Recent Labs    01/16/23 1920 01/16/23 2311 01/17/23 0601  WBC 11.7* 13.3*  --   HGB 6.3* 6.7* 8.1*  HCT 19.5* 21.3* 24.2*  PLT 270 328  --     Recent Labs    01/16/23 2311 01/17/23 0601  NA 137 138  K 3.4* 3.0*  CL 105 104  CO2 22 25  GLUCOSE 125* 116*  BUN 12 11  CREATININE 1.23 1.08  CALCIUM 7.5* 7.7*    Medications: I have reviewed the patient's current medications.  Assessment/Plan: Pharyngeal cancer, malnutrition, aspiration pneumonia  Looking good today.  Starting feedings via G-tube today.  May be able to be discharged when converted to bolus feeds and aspiration pneumonia treatment can be given via G-tube.   LOS: 2 days   Christia Reading 01/17/2023, 9:57 AM

## 2023-01-17 NOTE — Progress Notes (Signed)
Progress Note  1 Day Post-Op  Subjective: Patient HOH. RN at bedside. No family. Whiteboard used for communication. He has some soreness from surgery but no severe. He asks for swab to moisten his mouth  Objective: Vital signs in last 24 hours: Temp:  [97.2 F (36.2 C)-102.7 F (39.3 C)] 98.4 F (36.9 C) (04/17 0800) Pulse Rate:  [78-110] 87 (04/17 0800) Resp:  [16-32] 19 (04/17 0800) BP: (65-163)/(35-74) 158/74 (04/17 0800) SpO2:  [91 %-100 %] 93 % (04/17 0800) Last BM Date :  (unable to tell)  Intake/Output from previous day: 04/16 0701 - 04/17 0700 In: 3642.5 [I.V.:1018.4; Blood:376; IV Piggyback:2248.1] Out: 3931 [Urine:3930; Blood:1] Intake/Output this shift: No intake/output data recorded.  PE: General: pleasant, WD, male who is laying in bed in NAD Lungs:  Respiratory effort nonlabored on 2lpm [place] Abd: soft, ND, appropriate TTP over midline incision which is cdi with surgical glue. G tube with sutures intact without any discharge or surrounding erythema. Capped. Abdominal binder removed and replaced for exam MSK: all 4 extremities are symmetrical with no cyanosis, clubbing, or edema. Skin: warm and dry with no masses, lesions, or rashes Psych: A&Ox3 with an appropriate affect.    Lab Results:  Recent Labs    01/20/2023 1920 01/26/2023 2311 01/17/23 0601  WBC 11.7* 13.3*  --   HGB 6.3* 6.7* 8.1*  HCT 19.5* 21.3* 24.2*  PLT 270 328  --    BMET Recent Labs    01/13/2023 2311 01/17/23 0601  NA 137 138  K 3.4* 3.0*  CL 105 104  CO2 22 25  GLUCOSE 125* 116*  BUN 12 11  CREATININE 1.23 1.08  CALCIUM 7.5* 7.7*   PT/INR No results for input(s): "LABPROT", "INR" in the last 72 hours. CMP     Component Value Date/Time   NA 138 01/17/2023 0601   K 3.0 (L) 01/17/2023 0601   CL 104 01/17/2023 0601   CO2 25 01/17/2023 0601   GLUCOSE 116 (H) 01/17/2023 0601   BUN 11 01/17/2023 0601   CREATININE 1.08 01/17/2023 0601   CREATININE 1.30 (H) 01/12/2023 0912    CREATININE 1.17 02/21/2012 1046   CALCIUM 7.7 (L) 01/17/2023 0601   PROT 5.0 (L) 01/26/2023 1155   ALBUMIN 1.8 (L) 01/09/2023 1155   AST 29 01/26/2023 1155   AST 12 (L) 01/12/2023 0912   ALT 14 01/29/2023 1155   ALT 11 01/12/2023 0912   ALKPHOS 57 01/12/2023 1155   BILITOT 1.1 01/29/2023 1155   BILITOT 0.6 01/12/2023 0912   GFRNONAA >60 01/17/2023 0601   GFRNONAA 55 (L) 01/12/2023 0912   GFRAA 50 (L) 06/19/2020 0816   GFRAA 43 (L) 01/12/2020 1429   Lipase  No results found for: "LIPASE"     Studies/Results: DG Chest Port 1 View  Result Date: 01/23/2023 CLINICAL DATA:  ARDS EXAM: PORTABLE CHEST 1 VIEW COMPARISON:  Chest radiograph 05/16/2021 and chest CT 01/06/2022 FINDINGS: Large volume pneumoperitoneum. Recent percutaneous gastrostomy tube. There is chronic volume loss and scarring in the left lung after resection but increased opacity at the left base. Left apical pleural based thickening which is chronic. Normal heart size. IMPRESSION: 1. Infiltrate at the left lung base, likely pneumonia or aspiration. 2. Recent percutaneous gastrostomy tube with large volume pneumoperitoneum. Electronically Signed   By: Tiburcio Pea M.D.   On: 01/28/2023 12:08    Anti-infectives: Anti-infectives (From admission, onward)    Start     Dose/Rate Route Frequency Ordered Stop   01/02/2023 1430  Ampicillin-Sulbactam (UNASYN) 3 g in sodium chloride 0.9 % 100 mL IVPB        3 g 200 mL/hr over 30 Minutes Intravenous Every 8 hours 01/09/2023 1333     01/06/2023 0600  ceFAZolin (ANCEF) IVPB 2g/100 mL premix        2 g 200 mL/hr over 30 Minutes Intravenous On call to O.R. 01/12/2023 1514 01/11/2023 0800        Assessment/Plan  Pharyngeal cancer Intolerance to PO intake, malnutrition POD1 S/p Gastrostomy tube placement 4/16 Dr. Magnus Ivan - incision and g tube site healing well this am. Pain controlled. - can utilize tube for meds and feeds  - cont abdominal binder  FEN: NPO ID: [email or web address] for PNA VTE:  chemical ppx held in setting of anemia  Per primary Aspiration PNA Malnutrition  Anemia s/p 1u prbcs COPD CKD 3 dyslipidemia    LOS: 2 days   [name], [facility] Surgery 01/17/2023, 9:23 AM Please see Amion for pager number during day hours 7:00am-4:30pm

## 2023-01-18 ENCOUNTER — Encounter: Payer: Self-pay | Admitting: Radiation Oncology

## 2023-01-18 DIAGNOSIS — A419 Sepsis, unspecified organism: Secondary | ICD-10-CM | POA: Diagnosis not present

## 2023-01-18 DIAGNOSIS — E785 Hyperlipidemia, unspecified: Secondary | ICD-10-CM

## 2023-01-18 DIAGNOSIS — C140 Malignant neoplasm of pharynx, unspecified: Secondary | ICD-10-CM | POA: Diagnosis not present

## 2023-01-18 DIAGNOSIS — J189 Pneumonia, unspecified organism: Secondary | ICD-10-CM | POA: Diagnosis not present

## 2023-01-18 DIAGNOSIS — N183 Chronic kidney disease, stage 3 unspecified: Secondary | ICD-10-CM | POA: Diagnosis not present

## 2023-01-18 LAB — GLUCOSE, CAPILLARY
Glucose-Capillary: 134 mg/dL — ABNORMAL HIGH (ref 70–99)
Glucose-Capillary: 142 mg/dL — ABNORMAL HIGH (ref 70–99)
Glucose-Capillary: 155 mg/dL — ABNORMAL HIGH (ref 70–99)
Glucose-Capillary: 158 mg/dL — ABNORMAL HIGH (ref 70–99)
Glucose-Capillary: 160 mg/dL — ABNORMAL HIGH (ref 70–99)

## 2023-01-18 LAB — BASIC METABOLIC PANEL
Anion gap: 12 (ref 5–15)
BUN: 14 mg/dL (ref 8–23)
CO2: 25 mmol/L (ref 22–32)
Calcium: 8.1 mg/dL — ABNORMAL LOW (ref 8.9–10.3)
Chloride: 102 mmol/L (ref 98–111)
Creatinine, Ser: 0.97 mg/dL (ref 0.61–1.24)
GFR, Estimated: >60 mL/min (ref >60)
Glucose, Bld: 177 mg/dL — ABNORMAL HIGH (ref 70–99)
Potassium: 2.8 mmol/L — ABNORMAL LOW (ref 3.5–5.1)
Sodium: 139 mmol/L (ref 135–145)

## 2023-01-18 LAB — BPAM RBC
Blood Product Expiration Date: 202405142359
ISSUE DATE / TIME: 202404170042
Unit Type and Rh: 5100

## 2023-01-18 LAB — CULTURE, BLOOD (ROUTINE X 2)

## 2023-01-18 LAB — CBC
HCT: 32 % — ABNORMAL LOW (ref 39.0–52.0)
Hemoglobin: 10.8 g/dL — ABNORMAL LOW (ref 13.0–17.0)
MCH: 29.8 pg (ref 26.0–34.0)
MCHC: 33.8 g/dL (ref 30.0–36.0)
MCV: 88.2 fL (ref 80.0–100.0)
Platelets: 294 10*3/uL (ref 150–400)
RBC: 3.63 MIL/uL — ABNORMAL LOW (ref 4.22–5.81)
RDW: 14.1 % (ref 11.5–15.5)
WBC: 15.7 10*3/uL — ABNORMAL HIGH (ref 4.0–10.5)
nRBC: 0 % (ref 0.0–0.2)

## 2023-01-18 LAB — PHOSPHORUS: Phosphorus: 1.6 mg/dL — ABNORMAL LOW (ref 2.5–4.6)

## 2023-01-18 LAB — TYPE AND SCREEN

## 2023-01-18 LAB — MAGNESIUM: Magnesium: 1.8 mg/dL (ref 1.7–2.4)

## 2023-01-18 MED ORDER — POTASSIUM CHLORIDE 10 MEQ/100ML IV SOLN
10.0000 meq | INTRAVENOUS | Status: AC
  Administered 2023-01-18 (×5): 10 meq via INTRAVENOUS
  Filled 2023-01-18 (×5): qty 100

## 2023-01-18 MED ORDER — K PHOS MONO-SOD PHOS DI & MONO 155-852-130 MG PO TABS
500.0000 mg | ORAL_TABLET | Freq: Every day | ORAL | Status: DC
  Administered 2023-01-18 – 2023-01-22 (×5): 500 mg
  Filled 2023-01-18 (×5): qty 2

## 2023-01-18 MED ORDER — HYDRALAZINE HCL 20 MG/ML IJ SOLN
5.0000 mg | Freq: Four times a day (QID) | INTRAMUSCULAR | Status: DC | PRN
  Administered 2023-01-18 – 2023-01-19 (×2): 5 mg via INTRAVENOUS
  Filled 2023-01-18 (×2): qty 1

## 2023-01-18 MED ORDER — POTASSIUM PHOSPHATES 15 MMOLE/5ML IV SOLN
30.0000 mmol | Freq: Once | INTRAVENOUS | Status: AC
  Administered 2023-01-18: 30 mmol via INTRAVENOUS
  Filled 2023-01-18: qty 10

## 2023-01-18 NOTE — Progress Notes (Signed)
[facility]                                                                              Nathaniel Long, is a 82 y.o. male, DOB - Jan 25, 1941, FIE:[id number] Admit date - 01/21/2023    Outpatient Primary MD for the patient is [name], MD  LOS - 3  days  No chief complaint on file.      Brief summary   Patient is a 82 y.o. male with lung cancer, COPD, HTN, HLD, and pharyngeal carcinoma treated with radiation.  He has had persistent throat pain and been unable to maintain his weight and so was admitted on 01/27/2023 for gastric tube placement.  PEG tube was considered but could not be placed due to anatomy.  He underwent direct laryngoscopy under anesthesia yesterday with findings of "Posterior pharyngeal wall fully necrotic with spine palpable in depth of necrosis. Extends into upper esophagus that could not be entered."  Biopsies were taken and show cancer.  He was using Coke for swish and spit yesterday and was attempting to take oral pills not very successfully.  He had gastrostomy tube placement on 4/16.  When he returned from the procedure, BPs were in the 70s for an hour, he was febrile to 102, responded to IV fluids.    Assessment & Plan    Principal Problem:   Sepsis due to pneumonia, ?  Aspiration -Patient met sepsis criteria due to leukocytosis, fever, tachypnea, tachycardia, hypotension.  Possibly due to aspiration pneumonia -G-tube placed on 4/16, surgery following -Continue n.p.o., IV Unasyn, IV fluids -Blood cultures negative to date   Active problems Pharyngeal carcinoma Dysphagia -Diagnosed in 05/2022, deferred total laryngectomy and decided to do radiation for primarily palliative treatment -Worsening posterior neck pain, recent inability to tolerate PO -Underwent direct laryngoscopy on 4/15 and biopsies positive for cancer with perineural invasion -Overall poor prognosis, awaiting palliative medicine GOC -G-tube placed on 4/16, started on tube  feeds, high risk of refeeding syndrome.  For now continuous tube feedings, will change to bolus once patient tolerating tube feeds well.  Hypokalemia, hypophosphatemia -Aggressively replete lites, high risk of refeeding syndrome    Normocytic anemia  -Received 1 unit packed RBCs on 4/16 for hemoglobin of 6.3  -H&H stable   Protein-calorie malnutrition, severe -Recent severe weight loss due to inability to take PO -Albumin is 1.8 -Tube feeds started   Dyslipidemia -On Zetia as an outpatient, currently on hold -Has not been taking statins    COPD (chronic obstructive pulmonary disease) -On Stiolto as an outpatient -Currently on Brovana, Incruse, Xopenex -No longer smoking   CKD (chronic kidney disease) stage 3, GFR 30-59 ml/min -Appears to be stable at this time, at baseline 1.0-1.2   Estimated body mass index is 20.6 kg/m as calculated from the following:   Height as of this encounter: 5' 9.5" (1.765 m).   Weight as of this encounter: 64.2 kg.  Code Status: Full code DVT Prophylaxis:  SCDs Start: 01/20/2023 1039   Level of Care: Level of care: Progressive Family Communication:  Disposition Plan:      Remains inpatient appropriate: Will plan on disposition  once tolerating tube feeds   Procedures:  Insertion of gastrostomy tube on 4/16 Direct laryngoscopy with biopsy on 4/15  Consultants:   General surgery ENT  Antimicrobials:   Anti-infectives (From admission, onward)    Start     Dose/Rate Route Frequency Ordered Stop   01/26/2023 1430  Ampicillin-Sulbactam (UNASYN) 3 g in sodium chloride 0.9 % 100 mL IVPB        3 g 200 mL/hr over 30 Minutes Intravenous Every 8 hours 01/08/2023 1333     01/20/2023 0600  ceFAZolin (ANCEF) IVPB 2g/100 mL premix        2 g 200 mL/hr over 30 Minutes Intravenous On call to O.R. 01/20/2023 1514 01/10/2023 0800          Medications  amLODipine  5 mg Per Tube Daily   arformoterol  15 mcg Nebulization BID   And   umeclidinium  bromide  1 puff Inhalation Daily   free water  100 mL Per Tube Q8H   lidocaine  1 patch Transdermal Q24H   mupirocin ointment  1 Application Nasal BID   phosphorus  500 mg Per Tube Daily   thiamine  100 mg Per Tube Daily      Subjective:   Nathaniel Long was seen and examined today.  No acute complaints, tube feeds started, hoping to go home soon.  No nausea vomiting, abdominal pain, fevers. Objective:   Vitals:   01/18/23 0239 01/18/23 0804 01/18/23 0948 01/18/23 1126  BP: (!) 151/108 (!) 163/92  (!) 150/92  Pulse:  99 (!) 108 100  Resp: Temp: 98.1 F (36.7 C) 97.8 F (36.6 C)  97.8 F (36.6 C)  TempSrc: Oral Oral  Oral  SpO2: 94% 98% 99% 100%  Weight: 64.2 kg     Height:        Intake/Output Summary (Last 24 hours) at 01/18/2023 1235 Last data filed at 01/18/2023 1132 Gross per 24 hour  Intake 2964.78 ml  Output 4300 ml  Net -1335.22 ml     Wt Readings from Last 3 Encounters:  01/18/23 64.2 kg  01/12/23 62 kg  01/12/23 62.4 kg    Physical Exam General: Alert and oriented x 3, NAD, hearing deficit Cardiovascular: S1 S2 clear, RRR.  Respiratory: CTAB, no wheezing Gastrointestinal: G-tube abdominal binder+ Ext: no pedal edema bilaterally Neuro: no new deficits Psych: Normal affect    Data Reviewed:  I have personally reviewed following labs    CBC Lab Results  Component Value Date   WBC 15.7 (H) 01/18/2023   RBC 3.63 (L) 01/18/2023   HGB 10.8 (L) 01/18/2023   HCT 32.0 (L) 01/18/2023   MCV 88.2 01/18/2023   MCH 29.8 01/18/2023   PLT 294 01/18/2023   MCHC 33.8 01/18/2023   RDW 14.1 01/18/2023   LYMPHSABS 0.2 (L) 01/30/2023   MONOABS 0.3 01/29/2023   EOSABS 0.0 01/29/2023   BASOSABS 0.0 01/18/2023     Last metabolic panel Lab Results  Component Value Date   NA 139 01/18/2023   K 2.8 (L) 01/18/2023   CL 102 01/18/2023   CO2 25 01/18/2023   BUN 14 01/18/2023   CREATININE 0.97 01/18/2023   GLUCOSE 177 (H) 01/18/2023   GFRNONAA  >60 01/18/2023   GFRAA 50 (L) 06/19/2020   CALCIUM 8.1 (L) 01/18/2023   PHOS 1.6 (L) 01/18/2023   PROT 5.0 (L) 01/21/2023   ALBUMIN 1.8 (L) 01/24/2023   BILITOT 1.1 01/08/2023   ALKPHOS 57 01/04/2023  AST 29 01/05/2023   ALT 14 01/09/2023   ANIONGAP 12 01/18/2023    CBG (last 3)  Recent Labs    01/18/23 0258 01/18/23 0815 01/18/23 1200  GLUCAP 155* 160* 158*      Coagulation Profile: Recent Labs  Lab 01/12/23 1000  INR 1.2     Radiology Studies: I have personally reviewed the imaging studies  No results found.     Thad Ranger M.D. [facility] 01/18/2023, 12:35 PM  Available via Epic secure chat 7am-7pm After 7 pm, please refer to night coverage provider listed on amion.

## 2023-01-18 NOTE — Progress Notes (Signed)
Subjective: Did well overnight.  Objective: Vital signs in last 24 hours: Temp:  [97.4 F (36.3 C)-98.1 F (36.7 C)] 98.1 F (36.7 C) (04/18 0239) Pulse Rate:  [79-98] 98 (04/17 2300) Resp:  [20-24] 20 (04/18 0239) BP: (151-177)/(78-108) 151/108 (04/18 0239) SpO2:  [94 %-99 %] 94 % (04/18 0239) Weight:  [64.2 kg] 64.2 kg (04/18 0239) Wt Readings from Last 1 Encounters:  01/18/23 64.2 kg    Intake/Output from previous day: 04/17 0701 - 04/18 0700 In: 5810.5 [I.V.:4144; NG/GT:911.7; IV Piggyback:754.8] Out: 3500 [Urine:3500] Intake/Output this shift: No intake/output data recorded.  General appearance: alert, cooperative, and no distress Throat: no bleeding  Recent Labs    01/16/23 1920 01/16/23 2311 01/17/23 0601  WBC 11.7* 13.3*  --   HGB 6.3* 6.7* 8.1*  HCT 19.5* 21.3* 24.2*  PLT 270 328  --     Recent Labs    01/17/23 1726 01/18/23 0656  NA 137 139  K 3.3* 2.8*  CL 103 102  CO2 26 25  GLUCOSE 139* 177*  BUN 13 14  CREATININE 1.09 0.97  CALCIUM 7.8* 8.1*    Medications: I have reviewed the patient's current medications.  Assessment/Plan: Pharyngeal cancer, malnutrition, and aspiration pneumonia  Progressing well.  Tube feeds started.  Convert to bolus feeds when able.  Continues treatment for pneumonia.   LOS: 3 days   Christia Reading 01/18/2023, 8:08 AM

## 2023-01-18 NOTE — Progress Notes (Signed)
   01/18/23 1000  Spiritual Encounters  Type of Visit Follow up  Reason for visit Advance directives   F/U 04/18 0930 - went to room, papers not present as they are with daughter in law. Daughter in law not present however she is due in later today.  Will need follow - up at later time.

## 2023-01-18 NOTE — Evaluation (Signed)
Physical Therapy Evaluation Patient Details Name: Nathaniel Long MRN: [id number] DOB: Jul 02, 1941 Today's Date: 01/18/2023  History of Present Illness  Patient is a 82 year old male with pharyngeal cancer, malnutrition, aspiration pneumonia. Gastrostomy tube placement 4/16  Clinical Impression  Patient is agreeable to PT evaluation. He is very hard of hearing and most communication was completed via writing on the communication board. The patient reports he was independent with mobility prior to this hospital stay with limited ambulation distances. He lives with his spouse.  Today, the patient required minimal assistance for bed mobility and for standing. He declined walking due to fatigue with minimal activity and requesting to return to bed after standing briefly. Heart rate up to the 120's with activity with minimal pain reported around the abdomen and in the upper arms. The patient expressed he wants to return home at discharge. Anticipate the need for intermittent supervision/assistance and recommend to continue PT after this hospital stay.   Will continue to follow while in the hospital to maximize independence and decrease caregiver burden.      Recommendations for follow up therapy are one component of a multi-disciplinary discharge planning process, led by the attending physician.  Recommendations may be updated based on patient status, additional functional criteria and insurance authorization.  Follow Up Recommendations       Assistance Recommended at Discharge Intermittent Supervision/Assistance  Patient can return home with the following  A little help with walking and/or transfers;A little help with bathing/dressing/bathroom;Assist for transportation;Help with stairs or ramp for entrance;Assistance with cooking/housework    Equipment Recommendations Rolling walker (2 wheels)  Recommendations for Other Services       Functional Status Assessment Patient has had a recent decline  in their functional status and demonstrates the ability to make significant improvements in function in a reasonable and predictable amount of time.     Precautions / Restrictions Precautions Precautions: Fall Precaution Comments: abdominal binder Restrictions Weight Bearing Restrictions: No      Mobility  Bed Mobility Overal bed mobility: Needs Assistance Bed Mobility: Supine to Sit, Sit to Supine     Supine to sit: Min assist Sit to supine: Min assist   General bed mobility comments: assistance for LE support. increased time and effort required along with multi-modal cues    Transfers Overall transfer level: Needs assistance Equipment used: 1 person hand held assist Transfers: Sit to/from Stand Sit to Stand: Min assist           General transfer comment: lifting assistance required for standing    Ambulation/Gait               General Gait Details: patient declines, requesting to return to bed due to fatigue and pain. heart rate up to 120's with mobility  Stairs            Wheelchair Mobility    Modified Rankin (Stroke Patients Only)       Balance Overall balance assessment: Needs assistance Sitting-balance support: Feet supported Sitting balance-Leahy Scale: Good     Standing balance support: Single extremity supported Standing balance-Leahy Scale: Poor Standing balance comment: external support required from therapist                             Pertinent Vitals/Pain Pain Assessment Pain Assessment: Faces Faces Pain Scale: Hurts little more Pain Location: abdomen, bilateral upper arms Pain Descriptors / Indicators: Discomfort Pain Intervention(s): Limited activity within patient's tolerance, Monitored  during session, Repositioned    Home Living Family/patient expects to be discharged to:: Private residence Living Arrangements: Spouse/significant other Available Help at Discharge: Family Type of Home: House Home  Access: Level entry       Home Layout: One level Home Equipment: Wheelchair - manual      Prior Function Prior Level of Function : Independent/Modified Independent             Mobility Comments: patient reports he is independent with mobility at baseline, has a wheelchair he uses occasionally. limited walking recently due to acute medical issues       Hand Dominance        Extremity/Trunk Assessment   Upper Extremity Assessment Upper Extremity Assessment: Generalized weakness    Lower Extremity Assessment Lower Extremity Assessment: Generalized weakness       Communication   Communication: HOH (patient reads communication board)  Cognition Arousal/Alertness: Awake/alert Behavior During Therapy: WFL for tasks assessed/performed Overall Cognitive Status: Difficult to assess                                 General Comments: no family present. patient is able to follow single step commands with increased time, repetition, multi-modal cueing. decreased insight into overall medical issues        General Comments General comments (skin integrity, edema, etc.): telemetry monitored throughout session. no increased pain reported with mobility efforts    Exercises     Assessment/Plan    PT Assessment Patient needs continued PT services  PT Problem List Decreased strength;Decreased range of motion;Decreased activity tolerance;Decreased balance;Decreased mobility;Decreased safety awareness;Decreased cognition       PT Treatment Interventions DME instruction;Gait training;Stair training;Functional mobility training;Therapeutic activities;Therapeutic exercise;Balance training;Neuromuscular re-education;Cognitive remediation;Patient/family education;Wheelchair mobility training    PT Goals (Current goals can be found in the Care Plan section)  Acute Rehab PT Goals Patient Stated Goal: to go home and get in the bed PT Goal Formulation: With patient Time  For Goal Achievement: 02/01/23 Potential to Achieve Goals: Fair    Frequency Min 3X/week     Co-evaluation               AM-PAC PT "6 Clicks" Mobility  Outcome Measure Help needed turning from your back to your side while in a flat bed without using bedrails?: A Little Help needed moving from lying on your back to sitting on the side of a flat bed without using bedrails?: A Little Help needed moving to and from a bed to a chair (including a wheelchair)?: A Little Help needed standing up from a chair using your arms (e.g., wheelchair or bedside chair)?: A Little Help needed to walk in hospital room?: A Little Help needed climbing 3-5 steps with a railing? : A Lot 6 Click Score: 17    End of Session Equipment Utilized During Treatment: Oxygen Activity Tolerance: Patient tolerated treatment well;Patient limited by fatigue Patient left: in bed;with call bell/phone within reach;with bed alarm set Nurse Communication: Mobility status PT Visit Diagnosis: Unsteadiness on feet (R26.81);Muscle weakness (generalized) (M62.81)    Time: 6333-5456 PT Time Calculation (min) (ACUTE ONLY): 23 min   Charges:   PT Evaluation $PT Eval Moderate Complexity: 1 Mod          Donna Bernard, PT, MPT   Ina Homes 01/18/2023, 11:03 AM

## 2023-01-18 NOTE — Progress Notes (Signed)
Brief Nutrition Support Note  Spoke with pt at bedside. Communicated using dry erase board as pt is very HOH. Osmolite 1.5 tube feeds infusing continuously at rate of 30 ml/hr (goal rate of 50 ml/hr). Pt denies abdominal pain and N/V at this time. He states that he is ready to go home. Pt at high risk for refeeding and appears to be refeeding currently based on most recently electrolyte labs.  Last metabolic panel Lab Results  Component Value Date   GLUCOSE 177 (H) 01/18/2023   NA 139 01/18/2023   K 2.8 (L) 01/18/2023   CL 102 01/18/2023   CO2 25 01/18/2023   BUN 14 01/18/2023   CREATININE 0.97 01/18/2023   GFRNONAA >60 01/18/2023   CALCIUM 8.1 (L) 01/18/2023   PHOS 1.6 (L) 01/18/2023   PROT 5.0 (L) 01/16/2023   ALBUMIN 1.8 (L) 01/16/2023   BILITOT 1.1 01/16/2023   ALKPHOS 57 01/16/2023   AST 29 01/16/2023   ALT 14 01/16/2023   ANIONGAP 12 01/18/2023    Discussed with MD. Recommend continuing tube feeds at rate of 30 ml/hr and not continuing to advance to goal rate until until phosphorus is >2.0. MD would like to continue advancing slowly to goal rate while aggressively replacing electrolytes. MD has ordered K phos 500 mg daily per tube, IV potassium chloride 10 mEq x 5, and IV potassium phosphate 30 mmol x 1.  Per discussion with MD, will continue slowly advancing current tube feeding regiment to goal rate: - Advance rate of Osmolite 1.5 by 10 ml q 12 hours to goal rate of 50 ml/hr (1200 ml/day) - Free water flushes of 100 ml q 8 hours (RD to increase flushes after IV fluids are discontinued)   Continuous tube feeding regimen at goal rate provides 1800 kcal, 75 grams of protein, and 914 ml of H2O.   Total free water with flushes: 1214 ml  Continue to monitor refeeding labs q 12 hours.  Once pt is tolerating continuous tube feeds at goal rate via G-tube and electrolytes are stable, will transition to bolus tube feeding regimen.   Mertie Clause, MS, RD, LDN Inpatient Clinical  Dietitian Please see AMiON for contact information.

## 2023-01-18 NOTE — Progress Notes (Signed)
   01/18/23 1659  Spiritual Encounters  Type of Visit Follow up  Care provided to: Pt and family  Referral source Nurse (RN/NT/LPN)  OnCall Visit No   RN called to say PT had ACD finished and ready for notary. Mirna Mires examined papers and they are ready to go but notary is not available today.  Explained that we will have notary tomorrow.

## 2023-01-18 NOTE — Progress Notes (Signed)
Progress Note  2 Days Post-Op  Subjective: Patient HOH. No family at bedside. Whiteboard used for communication. Continued soreness from surgery worsened with movement but not severe. Care of incision and g tube explained and questions answered  Objective: Vital signs in last 24 hours: Temp:  [97.4 F (36.3 C)-98.1 F (36.7 C)] 98.1 F (36.7 C) (04/18 0239) Pulse Rate:  [79-98] 98 (04/17 2300) Resp:  [20-24] 20 (04/18 0239) BP: (151-177)/(78-108) 151/108 (04/18 0239) SpO2:  [94 %-99 %] 94 % (04/18 0239) Weight:  [64.2 kg] 64.2 kg (04/18 0239) Last BM Date :  (unable to tell)  Intake/Output from previous day: 04/17 0701 - 04/18 0700 In: 5810.5 [I.V.:4144; NG/GT:911.7; IV Piggyback:754.8] Out: 3500 [Urine:3500] Intake/Output this shift: No intake/output data recorded.  PE: General: pleasant, WD, male who is laying in bed in NAD Lungs:  Respiratory effort nonlabored Abd: soft, ND, appropriate TTP over midline incision which is cdi with surgical glue. G tube with sutures intact without any discharge or surrounding erythema. Tfs running. Abdominal binder removed and replaced for exam MSK: all 4 extremities are symmetrical with no cyanosis, clubbing, or edema. Skin: warm and dry with no masses, lesions, or rashes Psych: A&Ox3 with an appropriate affect.    Lab Results:  Recent Labs    01/08/2023 1920 01/01/2023 2311 01/17/23 0601  WBC 11.7* 13.3*  --   HGB 6.3* 6.7* 8.1*  HCT 19.5* 21.3* 24.2*  PLT 270 328  --     BMET Recent Labs    01/17/23 1726 01/18/23 0656  NA 137 139  K 3.3* 2.8*  CL 103 102  CO2 26 25  GLUCOSE 139* 177*  BUN 13 14  CREATININE 1.09 0.97  CALCIUM 7.8* 8.1*    PT/INR No results for input(s): "LABPROT", "INR" in the last 72 hours. CMP     Component Value Date/Time   NA 139 01/18/2023 0656   K 2.8 (L) 01/18/2023 0656   CL 102 01/18/2023 0656   CO2 25 01/18/2023 0656   GLUCOSE 177 (H) 01/18/2023 0656   BUN 14 01/18/2023 0656    CREATININE 0.97 01/18/2023 0656   CREATININE 1.30 (H) 01/12/2023 0912   CREATININE 1.17 02/21/2012 1046   CALCIUM 8.1 (L) 01/18/2023 0656   PROT 5.0 (L) 01/25/2023 1155   ALBUMIN 1.8 (L) 01/17/2023 1155   AST 29 01/19/2023 1155   AST 12 (L) 01/12/2023 0912   ALT 14 01/02/2023 1155   ALT 11 01/12/2023 0912   ALKPHOS 57 01/18/2023 1155   BILITOT 1.1 01/17/2023 1155   BILITOT 0.6 01/12/2023 0912   GFRNONAA >60 01/18/2023 0656   GFRNONAA 55 (L) 01/12/2023 0912   GFRAA 50 (L) 06/19/2020 0816   GFRAA 43 (L) 01/12/2020 1429   Lipase  No results found for: "LIPASE"     Studies/Results: DG Chest Port 1 View  Result Date: 01/26/2023 CLINICAL DATA:  ARDS EXAM: PORTABLE CHEST 1 VIEW COMPARISON:  Chest radiograph 05/16/2021 and chest CT 01/06/2022 FINDINGS: Large volume pneumoperitoneum. Recent percutaneous gastrostomy tube. There is chronic volume loss and scarring in the left lung after resection but increased opacity at the left base. Left apical pleural based thickening which is chronic. Normal heart size. IMPRESSION: 1. Infiltrate at the left lung base, likely pneumonia or aspiration. 2. Recent percutaneous gastrostomy tube with large volume pneumoperitoneum. Electronically Signed   By: Tiburcio Pea M.D.   On: 01/10/2023 12:08    Anti-infectives: Anti-infectives (From admission, onward)    Start  Dose/Rate Route Frequency Ordered Stop   01/26/2023 1430  Ampicillin-Sulbactam (UNASYN) 3 g in sodium chloride 0.9 % 100 mL IVPB        3 g 200 mL/hr over 30 Minutes Intravenous Every 8 hours 01/06/2023 1333     01/14/2023 0600  ceFAZolin (ANCEF) IVPB 2g/100 mL premix        2 g 200 mL/hr over 30 Minutes Intravenous On call to O.R. 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 Magnus Ivan - incision and g tube site healing well. Pain controlled. - tolerating tube feeds - cont  abdominal binder  General surgery team will remain available as needed. Please reach out with any questions or concerns  FEN: NPO/TFs ID: [email or web address] for PNA VTE: chemical ppx held in setting of anemia  Per primary Aspiration PNA Malnutrition  Anemia s/p 1u prbcs COPD CKD 3 dyslipidemia    LOS: 3 days   [name], [facility] 01/18/2023, 8:07 AM Please see Amion for pager number during day hours 7:00am-4:30pm

## 2023-01-18 NOTE — Care Management Important Message (Signed)
Important Message  Patient Details  Name: Nathaniel Long MRN: [id number] Date of Birth: December 04, 1940   Medicare Important Message Given:  Yes     Dorena Bodo 01/18/2023, 2:06 PM

## 2023-01-18 NOTE — Plan of Care (Signed)
Discussed with patient plan of care for the evening, pain management and HOB above 30 degrees with feeding tube with some teach back displayed.   Problem: Education: Goal: Knowledge of the prescribed therapeutic regimen will improve Outcome: Progressing

## 2023-01-18 NOTE — Consult Note (Signed)
[facility]  Telephone:([phone]) [phone] Fax:([phone]   MEDICAL ONCOLOGY - INITIAL CONSULTATION  Referral MD  Reason for Referral:  H and N cancer  HPI:   This is a pleasant 82 year old male patient with excellent underlying performance status when I first saw him back in July 2023 with the squamous cell carcinoma of the hypopharynx, p16 negative at that time was clinically staged T1b N0/N1, questionable metabolic activity in the left-sided lymph node.  We recommended/discussed concurrent chemoradiation but he did not want to consider chemotherapy back then, he mentioned that several people who had chemo that he knows either got wasted away from chemo or die during chemo.  He is extremely hard of hearing even back when I saw him and we had to literally scream at him for him to hear anything.  He was presented with all the options and he decided to proceed with radiation alone.  He most recently started to lose weight continue to complain of severe throat pain-saw Dr. Jenne Pane.  He was admitted for G-tube placement and had a direct laryngoscopy with biopsy.  Findings showed posterior pharyngeal wall fully necrotic with spine palpable and depth of necrosis.  Extends into upper esophagus that could not be entered.  Biopsies taken from each side of necrotic zone  He was presented in the ENT tumor board had right posterior pharyngeal wall biopsy.  This showed invasive squamous cell carcinoma, keratinizing moderately to poorly differentiated perineural invasion identified.  Posterior pharyngeal wall showed fibrous tissue with chronic inflammation ulceration and purulent exudate on the left side, negative for malignancy  His daughter-in-law Ms. Mitchelle was present at the time of this conversation.  We had to write everything on a white board for him to answer the questions.  Mr. Kafer continues to decline chemotherapy at this time.  He does not believe he is strong enough he is however  focused on nourishing himself and is hoping to maybe get to surgery.  He once again according to his daughter-in-law had many friends and family members who required chemo and did not do well.  He currently has a G-tube for feeding.  He continues to complain of severe throat pain and his constantly having to suction himself.  He lost a lot of weight since my appointment back in July 2023.  He continues to be in good spirit otherwise. Past Medical History:  Diagnosis Date   Anxiety    Cancer    lung   Carotid artery stenosis    Cataract    Chronic lower back pain    CKD (chronic kidney disease)    Complication of anesthesia    "can't pee when I come out of OR" (07/18/2017)   Contact with chainsaw as cause of accidental injury 07/16/2017   sternal fracture with bilateral anterior fifth and sixth rib fractures /notes 07/16/2017   COPD (chronic obstructive pulmonary disease)    Dyspnea    GERD (gastroesophageal reflux disease)    Hard of hearing    High cholesterol    Hypertension    Peripheral vascular disease    Thoracic ascending aortic aneurysm   :   Past Surgical History:  Procedure Laterality Date   ABDOMINAL AORTOGRAM W/LOWER EXTREMITY N/A 05/01/2017   Procedure: Abdominal Aortogram w/Lower Extremity;  Surgeon: Yates Decamp, MD;  Location: [facility];  Service: Cardiovascular;  Laterality: N/A;   BACK SURGERY     DIRECT LARYNGOSCOPY Bilateral Feb 07, 2023   Procedure: DIRECT LARYNGOSCOPY WITH BIOPSY;  Surgeon: Christia Reading, MD;  Location: [facility];  Service: ENT;  Laterality: Bilateral;   FINGER REPLANTATION Left    "ring finger"   GASTROSTOMY Left 01/21/2023   Procedure: INSERTION OF GASTROSTOMY TUBE;  Surgeon: Abigail Miyamoto, MD;  Location: [facility] OR;  Service: General;  Laterality: Left;   HEMORRHOID SURGERY     IR FLUORO RM 30-60 MIN  01/12/2023   POSTERIOR LUMBAR FUSION  1986   RENAL ANGIOGRAPHY Bilateral 12/18/2017   Procedure: RENAL ANGIOGRAPHY;  Surgeon: Elder Negus, MD;  Location: [facility];  Service: Cardiovascular;  Laterality: Bilateral;   VIDEO ASSISTED THORACOSCOPY (VATS)/ LOBECTOMY Left 09/19/2017   Procedure: LEFT VIDEO ASSISTED THORACOSCOPY (VATS)/ LEFT UPPER LOBECTOMY, LYMPH NODE DISECTION;  Surgeon: Loreli Slot, MD;  Location: [facility] OR;  Service: Thoracic;  Laterality: Left;   VIDEO BRONCHOSCOPY WITH ENDOBRONCHIAL NAVIGATION N/A 09/05/2017   Procedure: VIDEO BRONCHOSCOPY WITH ENDOBRONCHIAL NAVIGATION;  Surgeon: Leslye Peer, MD;  Location: [facility] OR;  Service: Thoracic;  Laterality: N/A;   VIDEO BRONCHOSCOPY WITH ENDOBRONCHIAL ULTRASOUND N/A 09/05/2017   Procedure: VIDEO BRONCHOSCOPY WITH ENDOBRONCHIAL ULTRASOUND;  Surgeon: Leslye Peer, MD;  Location: [facility] OR;  Service: Thoracic;  Laterality: N/A;  :   Current Facility-Administered Medications  Medication Dose Route Frequency Provider Last Rate Last Admin   amLODipine (NORVASC) tablet 5 mg  5 mg Per Tube Daily Dow Adolph N, DO   5 mg at 01/18/23 1030   Ampicillin-Sulbactam (UNASYN) 3 g in sodium chloride 0.9 % 100 mL IVPB  3 g Intravenous Q8H Mosetta Anis, RPH 200 mL/hr at 01/18/23 0605 3 g at 01/18/23 0605   arformoterol (BROVANA) nebulizer solution 15 mcg  15 mcg Nebulization BID Abigail Miyamoto, MD   15 mcg at 01/18/23 0947   And   umeclidinium bromide (INCRUSE ELLIPTA) 62.5 MCG/ACT 1 puff  1 puff Inhalation Daily Abigail Miyamoto, MD   1 puff at 01/18/23 0947   feeding supplement (OSMOLITE 1.5 CAL) liquid 1,000 mL  1,000 mL Per Tube Continuous Rai, Ripudeep K, MD 30 mL/hr at 01/18/23 0240 Rate Change at 01/18/23 0240   free water 100 mL  100 mL Per Tube Q8H Rai, Ripudeep K, MD   100 mL at 01/18/23 0606   hydrALAZINE (APRESOLINE) injection 5 mg  5 mg Intravenous Q6H PRN Dow Adolph N, DO   5 mg at 01/18/23 0048   lactated ringers infusion   Intravenous Continuous Rai, Ripudeep K, MD 100 mL/hr at 01/18/23 1031 Restarted at 01/18/23 1031   lidocaine (LIDODERM)  5 % 1 patch  1 patch Transdermal Q24H Abigail Miyamoto, MD   1 patch at 01/17/23 1544   morphine (PF) 2 MG/ML injection 2-4 mg  2-4 mg Intravenous Q2H PRN Abigail Miyamoto, MD   2 mg at 01/18/23 0351   mupirocin ointment (BACTROBAN) 2 % 1 Application  1 Application Nasal BID Abigail Miyamoto, MD   1 Application at 01/18/23 1030   ondansetron (ZOFRAN) injection 4 mg  4 mg Intravenous Q6H PRN Abigail Miyamoto, MD       phosphorus (K PHOS NEUTRAL) tablet 500 mg  500 mg Per Tube Daily Rai, Ripudeep K, MD       potassium chloride 10 mEq in 100 mL IVPB  10 mEq Intravenous Q1 Hr x 5 Rai, Ripudeep K, MD 100 mL/hr at 01/18/23 1336 10 mEq at 01/18/23 1336   potassium PHOSPHATE 30 mmol in dextrose 5 % 500 mL infusion  30 mmol Intravenous Once Rai, Ripudeep  K, MD       thiamine (VITAMIN B1) tablet 100 mg  100 mg Per Tube Daily Rai, Ripudeep K, MD   100 mg at 01/18/23 1030   Facility-Administered Medications Ordered in Other Encounters  Medication Dose Route Frequency Provider Last Rate Last Admin   0.9 %  sodium chloride infusion  250 mL Intravenous PRN Gold, Wayne E, PA-C       0.9 %  sodium chloride infusion   Intravenous Continuous Lonie Peak, MD       0.9 %  sodium chloride infusion   Intravenous Once Lonie Peak, MD       0.9 %  sodium chloride infusion   Intravenous Once Lonie Peak, MD       Melene Muller ON 01/19/2023] 0.9 %  sodium chloride infusion   Intravenous Once Lonie Peak, MD       0.9 %  sodium chloride infusion   Intravenous Once Lonie Peak, MD       0.9 %  sodium chloride infusion   Intravenous Once Lonie Peak, MD       0.9 %  sodium chloride infusion   Intravenous Once Lonie Peak, MD       0.9 %  sodium chloride infusion   Intravenous Once Lonie Peak, MD       0.9 %  sodium chloride infusion   Intravenous Once Lonie Peak, MD       0.9 %  sodium chloride infusion   Intravenous Once Lonie Peak, MD       0.9 %  sodium chloride infusion   Intravenous Once Lonie Peak, MD       Melene Muller ON 01/24/2023] 0.9 %  sodium chloride infusion   Intravenous Once Lonie Peak, MD       Melene Muller ON 01/24/2023] 0.9 %  sodium chloride infusion   Intravenous Once Lonie Peak, MD       Melene Muller ON 01/26/2023] 0.9 %  sodium chloride infusion   Intravenous Once Lonie Peak, MD       Melene Muller ON 01/29/2023] 0.9 %  sodium chloride infusion   Intravenous Once Lonie Peak, MD       Melene Muller ON 01/31/2023] 0.9 %  sodium chloride infusion   Intravenous Once Lonie Peak, MD       Melene Muller ON 02/02/2023] 0.9 %  sodium chloride infusion   Intravenous Once Lonie Peak, MD       levalbuterol Pauline Aus) nebulizer solution 0.63 mg  0.63 mg Nebulization Q6H PRN Alleen Borne, MD   0.63 mg at 09/29/17 1646   sodium chloride flush (NS) 0.9 % injection 3 mL  3 mL Intravenous Q12H Gold, Wayne E, PA-C       sodium chloride flush (NS) 0.9 % injection 3 mL  3 mL Intravenous Q12H Gold, Wayne E, PA-C       sodium chloride flush (NS) 0.9 % injection 3 mL  3 mL Intravenous PRN Gold, Wayne E, PA-C          Allergies  Allergen Reactions   Gabapentin Other (See Comments)    dizziness   Statins Other (See Comments)    Muscle weakness  :   Family History  Problem Relation Age of Onset   Heart disease Mother    Liver disease Father    Dementia Sister    Colon polyps Brother    Colon cancer Brother   :   Social History   Socioeconomic History   Marital status: Married  Spouse name: Not on file   Number of children: Not on file   Years of education: Not on file   Highest education level: Not on file  Occupational History   Not on file  Tobacco Use   Smoking status: Former    Packs/day: 1.00    Years: 33.00    Additional pack years: 0.00    Total pack years: 33.00    Types: Cigarettes    Quit date: 04/17/2017    Years since quitting: 5.7   Smokeless tobacco: Never   Tobacco comments:    May, 2018  Vaping Use   Vaping Use: Never used  Substance and Sexual Activity   Alcohol  use: Not Currently    Comment: last drink 1995   Drug use: No   Sexual activity: Not Currently  Other Topics Concern   Not on file  Social History Narrative   Not on file   Social Determinants of Health   Financial Resource Strain: Low Risk  (10/03/2017)   Overall Financial Resource Strain (CARDIA)    Difficulty of Paying Living Expenses: Not hard at all  Food Insecurity: Food Insecurity Present (01/20/2023)   Hunger Vital Sign    Worried About Running Out of Food in the Last Year: Sometimes true    Ran Out of Food in the Last Year: Sometimes true  Transportation Needs: No Transportation Needs (01/17/2023)   PRAPARE - Administrator, Civil Service (Medical): No    Lack of Transportation (Non-Medical): No  Physical Activity: Inactive (10/03/2017)   Exercise Vital Sign    Days of Exercise per Week: 0 days    Minutes of Exercise per Session: 0 min  Stress: No Stress Concern Present (10/03/2017)   [employer] of Occupational Health - Occupational Stress Questionnaire    Feeling of Stress : Not at all  Social Connections: Somewhat Isolated (10/03/2017)   Social Connection and Isolation Panel [NHANES]    Frequency of Communication with Friends and Family: Never    Frequency of Social Gatherings with Friends and Family: More than three times a week    Attends Religious Services: Never    Database administrator or Organizations: No    Attends Banker Meetings: Never    Marital Status: Married  Catering manager Violence: Not At Risk (12/27/2022)   Humiliation, Afraid, Rape, and Kick questionnaire    Fear of Current or Ex-Partner: No    Emotionally Abused: No    Physically Abused: No    Sexually Abused: No    Exam: Patient Vitals for the past 24 hrs:  BP Temp Temp src Pulse Resp SpO2 Weight  01/18/23 1126 (!) 150/92 97.8 F (36.6 C) Oral 100 19 100 % --  01/18/23 0948 -- -- -- (!) 108 20 99 % --  01/18/23 0804 (!) 163/92 97.8 F (36.6 C) Oral 99 20 98 % --   01/18/23 0239 (!) 151/108 98.1 F (36.7 C) Oral -- 20 94 % 141 lb 8.6 oz (64.2 kg)  01/18/23 0048 (!) 162/94 -- -- -- -- -- --  01/18/23 0000 (!) 173/85 -- -- -- -- -- --  01/17/23 2300 -- 98.1 F (36.7 C) Oral 98 (!) 24 97 % --  01/17/23 2000 (!) 169/82 -- -- -- -- -- --  01/17/23 1939 -- -- -- -- -- 95 % --  01/17/23 1900 -- 97.7 F (36.5 C) Oral 85 (!) 22 94 % --  01/17/23 1622 (!) 152/83 (!) 97.4 F (  36.3 C) Oral 83 (!) 24 94 % --    Physical Exam Constitutional:      Appearance: Normal appearance.  Neurological:     Mental Status: He is alert.    Rest of the physical exam deferred in lieu of counseling   Lab Results  Component Value Date   WBC 15.7 (H) 01/18/2023   HGB 10.8 (L) 01/18/2023   HCT 32.0 (L) 01/18/2023   PLT 294 01/18/2023   GLUCOSE 177 (H) 01/18/2023   CHOL 114 07/26/2020   TRIG 123 07/26/2020   HDL 36 (L) 07/26/2020   LDLCALC 56 07/26/2020   ALT 14 01/24/2023   AST 29 01/11/2023   NA 139 01/18/2023   K 2.8 (L) 01/18/2023   CL 102 01/18/2023   CREATININE 0.97 01/18/2023   BUN 14 01/18/2023   CO2 25 01/18/2023    DG Chest Port 1 View  Result Date: 01/12/2023 CLINICAL DATA:  ARDS EXAM: PORTABLE CHEST 1 VIEW COMPARISON:  Chest radiograph 05/16/2021 and chest CT 01/06/2022 FINDINGS: Large volume pneumoperitoneum. Recent percutaneous gastrostomy tube. There is chronic volume loss and scarring in the left lung after resection but increased opacity at the left base. Left apical pleural based thickening which is chronic. Normal heart size. IMPRESSION: 1. Infiltrate at the left lung base, likely pneumonia or aspiration. 2. Recent percutaneous gastrostomy tube with large volume pneumoperitoneum. Electronically Signed   By: Tiburcio Pea M.D.   On: 01/12/2023 12:08   IR Fluoro Rm 30-60 Min  Result Date: 01/12/2023 INDICATION: Head and neck malignancy EXAM: Canceled G-tube placement MEDICATIONS: None ANESTHESIA/SEDATION: None CONTRAST:  None FLUOROSCOPY:  Radiation Exposure Index (as provided by the fluoroscopic device): 1 mGy Kerma COMPLICATIONS: None immediate. PROCEDURE: Informed written consent was obtained from the patient after a thorough discussion of the procedural risks, benefits and alternatives. All questions were addressed. Scout image of the abdomen demonstrates colon interposed anterior to the stomach. Safe percutaneous window identified. Procedure was terminated. IMPRESSION: Gastrostomy tube placement terminated given colon interposed anterior to the stomach. Electronically Signed   By: Acquanetta Belling M.D.   On: 01/12/2023 17:02   NM PET Image Restag (PS) Skull Base To Thigh  Result Date: 12/26/2022 CLINICAL DATA:  Subsequent treatment strategy for head and neck cancer. EXAM: NUCLEAR MEDICINE PET SKULL BASE TO THIGH TECHNIQUE: 7.9 mCi F-18 FDG was injected intravenously. Full-ring PET imaging was performed from the skull base to thigh after the radiotracer. CT data was obtained and used for attenuation correction and anatomic localization. Fasting blood glucose: 96 mg/dl COMPARISON:  16/07/9603 FINDINGS: Mediastinal blood pool activity: SUV max 2.8 Liver activity: SUV max NA NECK: Exam is limited by misregistration. There is symmetric homogeneous uptake, likely in the region of the vocal cords possibly related to patient's speaking after contrast injection. There is some debris in the posterior hypopharynx in this region, nonspecific. Activity is superimposed on the posterior hypopharynx, corresponding to the low level retro pharyngeal uptake seen on the previous study. Muscular uptake in the lower neck probably from patient motion. No definite hypermetabolic cervical lymphadenopathy. Incidental CT findings: None. CHEST: Previously identified left juxta hilar bandlike medial left upper lobe opacity is similar with persistent low level FDG uptake ( SUV max = 5.0 compared to 4.2 previously). Low level FDG uptake in a subcarinal node again noted with  SUV max = 2.7 today compared to 3.0 previously. No hypermetabolic hilar or axillary lymphadenopathy. Nonspecific FDG uptake ( SUV max = 2.6) identified a lung the vascular bundle  to the posterior right lower lobe. 5 mm right middle lobe pulmonary nodule seen anteriorly on image 100/4 shows low level FDG accumulation with SUV max = 3.1. 5 mm posterior right middle lobe pulmonary nodule on 99/4 demonstrates SUV max = 3.7 Incidental CT findings: Coronary artery calcification is evident. Aortic valve calcification evident. Ascending thoracic aorta measures up to 4.4 cm diameter. Centrilobular and paraseptal emphysema evident. ABDOMEN/PELVIS: No abnormal hypermetabolic activity within the liver, pancreas, adrenal glands, or spleen. No hypermetabolic lymph nodes in the abdomen or pelvis. Incidental CT findings: There is advanced atherosclerotic calcification of the abdominal aorta without aneurysm. SKELETON: No focal hypermetabolic activity to suggest skeletal metastasis. Incidental CT findings: No worrisome lytic or sclerotic osseous abnormality. Old left-sided rib fractures evident. IMPRESSION: 1. Study limited today by misregistration in the region of the neck and upper chest. Symmetric hypermetabolism is identified in the region of the vocal cords and posterior retropharynx and posterior hypopharynx with some debris visible in the posterior hypopharynx on CT imaging. Findings are nonspecific and may be related to patient vocalization after radiotracer injection. As local recurrence cannot be excluded, direct visualization likely warranted. 2. No evidence for hypermetabolic cervical lymphadenopathy. 3. Two tiny right middle lobe pulmonary nodules with low level FDG accumulation are new in the interval. Potentially infectious/inflammatory, close follow-up recommended as metastatic disease not excluded. 4. Similar low level FDG accumulation in a subcarinal lymph node and bandlike soft tissue in the medial left upper  lung. Both findings are likely benign/chronic. Attention on follow-up recommended. 5. No new sites of hypermetabolic disease in the abdomen or pelvis on today's study. 6. Aortic Atherosclerosis (ICD10-I70.0) and Emphysema (ICD10-J43.9). Electronically Signed   By: Kennith Center M.D.   On: 12/26/2022 09:58    Pathology: Invasive squamous cell carcinoma, keratinizing, moderately to poorly differentiated, perineural invasion identified.  DG Chest Port 1 View  Result Date: 01/10/2023 CLINICAL DATA:  ARDS EXAM: PORTABLE CHEST 1 VIEW COMPARISON:  Chest radiograph 05/16/2021 and chest CT 01/06/2022 FINDINGS: Large volume pneumoperitoneum. Recent percutaneous gastrostomy tube. There is chronic volume loss and scarring in the left lung after resection but increased opacity at the left base. Left apical pleural based thickening which is chronic. Normal heart size. IMPRESSION: 1. Infiltrate at the left lung base, likely pneumonia or aspiration. 2. Recent percutaneous gastrostomy tube with large volume pneumoperitoneum. Electronically Signed   By: Tiburcio Pea M.D.   On: 01/29/2023 12:08   IR Fluoro Rm 30-60 Min  Result Date: 01/12/2023 INDICATION: Head and neck malignancy EXAM: Canceled G-tube placement MEDICATIONS: None ANESTHESIA/SEDATION: None CONTRAST:  None FLUOROSCOPY: Radiation Exposure Index (as provided by the fluoroscopic device): 1 mGy Kerma COMPLICATIONS: None immediate. PROCEDURE: Informed written consent was obtained from the patient after a thorough discussion of the procedural risks, benefits and alternatives. All questions were addressed. Scout image of the abdomen demonstrates colon interposed anterior to the stomach. Safe percutaneous window identified. Procedure was terminated. IMPRESSION: Gastrostomy tube placement terminated given colon interposed anterior to the stomach. Electronically Signed   By: Acquanetta Belling M.D.   On: 01/12/2023 17:02   NM PET Image Restag (PS) Skull Base To  Thigh  Result Date: 12/26/2022 CLINICAL DATA:  Subsequent treatment strategy for head and neck cancer. EXAM: NUCLEAR MEDICINE PET SKULL BASE TO THIGH TECHNIQUE: 7.9 mCi F-18 FDG was injected intravenously. Full-ring PET imaging was performed from the skull base to thigh after the radiotracer. CT data was obtained and used for attenuation correction and anatomic localization. Fasting blood glucose:  96 mg/dl COMPARISON:  16/07/9603 FINDINGS: Mediastinal blood pool activity: SUV max 2.8 Liver activity: SUV max NA NECK: Exam is limited by misregistration. There is symmetric homogeneous uptake, likely in the region of the vocal cords possibly related to patient's speaking after contrast injection. There is some debris in the posterior hypopharynx in this region, nonspecific. Activity is superimposed on the posterior hypopharynx, corresponding to the low level retro pharyngeal uptake seen on the previous study. Muscular uptake in the lower neck probably from patient motion. No definite hypermetabolic cervical lymphadenopathy. Incidental CT findings: None. CHEST: Previously identified left juxta hilar bandlike medial left upper lobe opacity is similar with persistent low level FDG uptake ( SUV max = 5.0 compared to 4.2 previously). Low level FDG uptake in a subcarinal node again noted with SUV max = 2.7 today compared to 3.0 previously. No hypermetabolic hilar or axillary lymphadenopathy. Nonspecific FDG uptake ( SUV max = 2.6) identified a lung the vascular bundle to the posterior right lower lobe. 5 mm right middle lobe pulmonary nodule seen anteriorly on image 100/4 shows low level FDG accumulation with SUV max = 3.1. 5 mm posterior right middle lobe pulmonary nodule on 99/4 demonstrates SUV max = 3.7 Incidental CT findings: Coronary artery calcification is evident. Aortic valve calcification evident. Ascending thoracic aorta measures up to 4.4 cm diameter. Centrilobular and paraseptal emphysema evident.  ABDOMEN/PELVIS: No abnormal hypermetabolic activity within the liver, pancreas, adrenal glands, or spleen. No hypermetabolic lymph nodes in the abdomen or pelvis. Incidental CT findings: There is advanced atherosclerotic calcification of the abdominal aorta without aneurysm. SKELETON: No focal hypermetabolic activity to suggest skeletal metastasis. Incidental CT findings: No worrisome lytic or sclerotic osseous abnormality. Old left-sided rib fractures evident. IMPRESSION: 1. Study limited today by misregistration in the region of the neck and upper chest. Symmetric hypermetabolism is identified in the region of the vocal cords and posterior retropharynx and posterior hypopharynx with some debris visible in the posterior hypopharynx on CT imaging. Findings are nonspecific and may be related to patient vocalization after radiotracer injection. As local recurrence cannot be excluded, direct visualization likely warranted. 2. No evidence for hypermetabolic cervical lymphadenopathy. 3. Two tiny right middle lobe pulmonary nodules with low level FDG accumulation are new in the interval. Potentially infectious/inflammatory, close follow-up recommended as metastatic disease not excluded. 4. Similar low level FDG accumulation in a subcarinal lymph node and bandlike soft tissue in the medial left upper lung. Both findings are likely benign/chronic. Attention on follow-up recommended. 5. No new sites of hypermetabolic disease in the abdomen or pelvis on today's study. 6. Aortic Atherosclerosis (ICD10-I70.0) and Emphysema (ICD10-J43.9). Electronically Signed   By: Kennith Center M.D.   On: 12/26/2022 09:58    Assessment and Plan:   This is a very pleasant 82 year old male patient with residual slow recurrent invasive squamous cell carcinoma from the right posterior pharyngeal wall with extensive necrosis almost extending to the spine presented in the head and neck tumor board for recommendations.  According to him and his  daughter-in-law he was not able to eat for the past 2 months, lost a lot of weight.  He is extremely hard of hearing, we had to write on a white board to converse with him.  He continues to decline chemotherapy.  In the past when we presented chemoradiation he was not interested in it either.  He tells me that a lot of his family members and friends who had chemo died from chemotherapy.  He understands that he may  not be indeed strong enough to tolerate chemo at this time compared to the first time.  Will order CPS score on the biopsy.  If he has CPS greater than 20 then we can attempt single agent immunotherapy.  He is otherwise not interested in chemotherapy per se.  If he is not interested in chemotherapy and since he is not currently a candidate for surgery, we have discussed about palliative care and hospice and he is agreeable to this.  He understands that if he indeed gets stronger and decides to try some treatment he can certainly come off of hospice.  I think this is a reasonable plan given his age and his current performance status.  Thank you for consulting [place] in the care of this patient.  Please do not hesitate to contact us with any additional questions or concerns.  The length of time of the face-to-face encounter was 55 minutes. More than 50% of time was spent counseling and coordination of care.  Thank you for this referral.

## 2023-01-18 NOTE — Plan of Care (Signed)
  Problem: Clinical Measurements: Goal: Respiratory complications will improve Outcome: Progressing Goal: Cardiovascular complication will be avoided Outcome: Progressing   Problem: Activity: Goal: Risk for activity intolerance will decrease Outcome: Progressing   Problem: Coping: Goal: Level of anxiety will decrease Outcome: Progressing   Problem: Pain Managment: Goal: General experience of comfort will improve Outcome: Progressing   Problem: Safety: Goal: Ability to remain free from injury will improve Outcome: Progressing   

## 2023-01-19 ENCOUNTER — Telehealth: Payer: Self-pay | Admitting: Hematology and Oncology

## 2023-01-19 ENCOUNTER — Inpatient Hospital Stay: Payer: Medicare Other

## 2023-01-19 ENCOUNTER — Telehealth: Payer: Self-pay | Admitting: Internal Medicine

## 2023-01-19 DIAGNOSIS — J189 Pneumonia, unspecified organism: Secondary | ICD-10-CM | POA: Diagnosis not present

## 2023-01-19 DIAGNOSIS — J42 Unspecified chronic bronchitis: Secondary | ICD-10-CM | POA: Diagnosis not present

## 2023-01-19 DIAGNOSIS — Z7189 Other specified counseling: Secondary | ICD-10-CM | POA: Diagnosis not present

## 2023-01-19 DIAGNOSIS — N183 Chronic kidney disease, stage 3 unspecified: Secondary | ICD-10-CM | POA: Diagnosis not present

## 2023-01-19 DIAGNOSIS — C140 Malignant neoplasm of pharynx, unspecified: Secondary | ICD-10-CM | POA: Diagnosis not present

## 2023-01-19 DIAGNOSIS — E43 Unspecified severe protein-calorie malnutrition: Secondary | ICD-10-CM | POA: Diagnosis not present

## 2023-01-19 LAB — GLUCOSE, CAPILLARY
Glucose-Capillary: 149 mg/dL — ABNORMAL HIGH (ref 70–99)
Glucose-Capillary: 152 mg/dL — ABNORMAL HIGH (ref 70–99)
Glucose-Capillary: 147 mg/dL — ABNORMAL HIGH (ref 70–99)
Glucose-Capillary: 141 mg/dL — ABNORMAL HIGH (ref 70–99)
Glucose-Capillary: 153 mg/dL — ABNORMAL HIGH (ref 70–99)
Glucose-Capillary: 138 mg/dL — ABNORMAL HIGH (ref 70–99)

## 2023-01-19 LAB — CULTURE, BLOOD (ROUTINE X 2): Special Requests: ADEQUATE

## 2023-01-19 NOTE — Progress Notes (Signed)
Mobility Specialist: Progress Note   01/19/23 1404  Mobility  Activity Refused mobility   Pt refused mobility, originally agreeable to sit EOB but after set up pt stating "I need to sleep" and refusing mobility. Will f/u as able.   Nathaniel Long Mobility Specialist Please contact via SecureChat or Rehab office at [phone]

## 2023-01-19 NOTE — Telephone Encounter (Signed)
Spoke with patient confirming upcoming appointment  

## 2023-01-19 NOTE — Telephone Encounter (Signed)
Patients daughter called to let us know Patient is still in patient in the hospital and wont be at appointment today.

## 2023-01-19 NOTE — Progress Notes (Signed)
[facility]                                                                              Teofil Maniaci, is a 82 y.o. male, DOB - 1941/06/06, ZOX:[id number] Admit date - 01/29/2023    Outpatient Primary MD for the patient is [name], MD  LOS - 4  days  No chief complaint on file.      Brief summary   Patient is a 82 y.o. male with lung cancer, COPD, HTN, HLD, and pharyngeal carcinoma treated with radiation.  He has had persistent throat pain and been unable to maintain his weight and so was admitted on 01/28/2023 for gastric tube placement.  PEG tube was considered but could not be placed due to anatomy.  He underwent direct laryngoscopy under anesthesia yesterday with findings of "Posterior pharyngeal wall fully necrotic with spine palpable in depth of necrosis. Extends into upper esophagus that could not be entered."  Biopsies were taken and show cancer.  He was using Coke for swish and spit yesterday and was attempting to take oral pills not very successfully.  He had gastrostomy tube placement on 4/16.  When he returned from the procedure, BPs were in the 70s for an hour, he was febrile to 102, responded to IV fluids.    Assessment & Plan    Principal Problem:   Sepsis due to pneumonia, ?  Aspiration -Patient met sepsis criteria due to leukocytosis, fever, tachypnea, tachycardia, hypotension.  Possibly due to aspiration pneumonia -G-tube placed on 4/16, surgery following -Continue n.p.o., IV Unasyn,  -Blood cultures NTD  Active problems Pharyngeal carcinoma Dysphagia -Diagnosed in 05/2022, deferred total laryngectomy and decided to do radiation for primarily palliative treatment -Worsening posterior neck pain, recent inability to tolerate PO -Underwent direct laryngoscopy on 4/15 and biopsies positive for cancer with perineural invasion -Overall poor prognosis, awaiting palliative medicine GOC -G-tube placed on 4/16, started on tube feeds, high risk of  refeeding syndrome.  For now continuous tube feedings, will change to bolus once patient tolerating tube feeds well. -Patient remains high risk for refeeding syndrome, he is refusing labs  Hypokalemia, hypophosphatemia -Refused labs today    Normocytic anemia  -Received 1 unit packed RBCs on 4/16 for hemoglobin of 6.3  -H&H stable   Protein-calorie malnutrition, severe -Recent severe weight loss due to inability to take PO -Albumin is 1.8 -Tube feeds started   Dyslipidemia -On Zetia as an outpatient, currently on hold -Has not been taking statins    COPD (chronic obstructive pulmonary disease) -On Stiolto as an outpatient -Currently on Brovana, Incruse, Xopenex -No longer smoking   CKD (chronic kidney disease) stage 3, GFR 30-59 ml/min -Appears to be stable at this time, at baseline 1.0-1.2   Estimated body mass index is 19.19 kg/m as calculated from the following:   Height as of this encounter: 5' 9.5" (1.765 m).   Weight as of this encounter: 59.8 kg.  Code Status: Full code DVT Prophylaxis:  SCDs Start: 01/29/2023 1039   Level of Care: Level of care: Progressive Family Communication:  Disposition Plan:      Remains inpatient appropriate: Will plan  on disposition once tolerating tube feeds   Procedures:  Insertion of gastrostomy tube on 4/16 Direct laryngoscopy with biopsy on 4/15  Consultants:   General surgery ENT  Antimicrobials:   Anti-infectives (From admission, onward)    Start     Dose/Rate Route Frequency Ordered Stop   01/23/2023 1430  Ampicillin-Sulbactam (UNASYN) 3 g in sodium chloride 0.9 % 100 mL IVPB        3 g 200 mL/hr over 30 Minutes Intravenous Every 8 hours 01/06/2023 1333     01/27/2023 0600  ceFAZolin (ANCEF) IVPB 2g/100 mL premix        2 g 200 mL/hr over 30 Minutes Intravenous On call to O.R. 01/06/2023 1514 01/14/2023 0800          Medications  amLODipine  5 mg Per Tube Daily   arformoterol  15 mcg Nebulization BID   And    umeclidinium bromide  1 puff Inhalation Daily   free water  100 mL Per Tube Q8H   lidocaine  1 patch Transdermal Q24H   mupirocin ointment  1 Application Nasal BID   phosphorus  500 mg Per Tube Daily   thiamine  100 mg Per Tube Daily      Subjective:   Nathaniel Long was seen and examined today.  No acute complaints, tolerating tube feeds, refusing labs today.  No ongoing nausea vomiting, abdominal pain or any fevers.  .   Objective:   Vitals:   01/19/23 0623 01/19/23 0624 01/19/23 0735 01/19/23 1146  BP:   (!) 160/125 (!) 175/98  Pulse: 98  96 96  Resp:  19 19 (!) 25  Temp:      TempSrc:      SpO2:   94% 91%  Weight:      Height:        Intake/Output Summary (Last 24 hours) at 01/19/2023 1152 Last data filed at 01/19/2023 1146 Gross per 24 hour  Intake 3960.86 ml  Output 4850 ml  Net -889.14 ml     Wt Readings from Last 3 Encounters:  01/19/23 59.8 kg  01/12/23 62 kg  01/12/23 62.4 kg   Physical Exam General: Alert and oriented x 3, NAD, hearing deficit Cardiovascular: S1 S2 clear, RRR.  Respiratory: CTAB, no wheezing Gastrointestinal: Soft, G-tube+ abdominal binder+ Ext: no pedal edema bilaterally Neuro: no new deficits Psych: Normal affect   Data Reviewed:  I have personally reviewed following labs    CBC Lab Results  Component Value Date   WBC 15.7 (H) 01/18/2023   RBC 3.63 (L) 01/18/2023   HGB 10.8 (L) 01/18/2023   HCT 32.0 (L) 01/18/2023   MCV 88.2 01/18/2023   MCH 29.8 01/18/2023   PLT 294 01/18/2023   MCHC 33.8 01/18/2023   RDW 14.1 01/18/2023   LYMPHSABS 0.2 (L) 01/05/2023   MONOABS 0.3 01/02/2023   EOSABS 0.0 01/23/2023   BASOSABS 0.0 01/30/2023     Last metabolic panel Lab Results  Component Value Date   NA 139 01/18/2023   K 2.8 (L) 01/18/2023   CL 102 01/18/2023   CO2 25 01/18/2023   BUN 14 01/18/2023   CREATININE 0.97 01/18/2023   GLUCOSE 177 (H) 01/18/2023   GFRNONAA >60 01/18/2023   GFRAA 50 (L) 06/19/2020   CALCIUM  8.1 (L) 01/18/2023   PHOS 1.6 (L) 01/18/2023   PROT 5.0 (L) 01/24/2023   ALBUMIN 1.8 (L) 01/24/2023   BILITOT 1.1 01/18/2023   ALKPHOS 57 01/20/2023   AST 29 01/10/2023  ALT 14 02-13-23   ANIONGAP 12 01/18/2023    CBG (last 3)  Recent Labs    01/19/23 0414 01/19/23 0742 01/19/23 1142  GLUCAP 149* 152* 147*      Coagulation Profile: No results for input(s): "INR", "PROTIME" in the last 168 hours.    Radiology Studies: I have personally reviewed the imaging studies  No results found.     Thad Ranger M.D. [facility] 01/19/2023, 11:52 AM  Available via Epic secure chat 7am-7pm After 7 pm, please refer to night coverage provider listed on amion.

## 2023-01-19 NOTE — TOC Progression Note (Addendum)
Transition of Care [facility]) - Progression Note    Patient Details  Name: Nathaniel Long MRN: [id number] Date of Birth: 03/23/1941  Transition of Care [facility]) CM/SW Contact  Leone Haven, RN Phone Number: 01/19/2023, 3:02 PM  Clinical Narrative:    Patient is not at goal rate yet for tube feeds and has been refusing lab draws, he will need to be switched to bolus feeds prior to dc. He is not ready yet. NCM offfered choice for [facility] if he decides to go home with [facility], wife states they may be going with Hospice, so dispo is not decided on right now.    Expected Discharge Plan: Home w Home Health Services Barriers to Discharge: Continued Medical Work up  Expected Discharge Plan and Services   Discharge Planning Services: CM Consult   Living arrangements for the past 2 months: Single Family Home                 DME Arranged: Tube feeding DME Agency: Other - Comment Opal Sidles) Date DME Agency Contacted: 01/17/23 Time DME Agency Contacted: [phone] Representative spoke with at DME Agency: Pam HH Arranged: PT HH Agency: [facility] Date HH Agency Contacted: 01/17/23 Time HH Agency Contacted: 1513 Representative spoke with at [facility] Agency: Morrie Sheldon   Social Determinants of Health (SDOH) Interventions SDOH Screenings   Food Insecurity: Food Insecurity Present (01/26/2023)  Housing: Low Risk  (01/21/2023)  Transportation Needs: No Transportation Needs (01/18/2023)  Utilities: At Risk (01/27/2023)  Financial Resource Strain: Low Risk  (10/03/2017)  Physical Activity: Inactive (10/03/2017)  Social Connections: Somewhat Isolated (10/03/2017)  Stress: No Stress Concern Present (10/03/2017)  Tobacco Use: Medium Risk (01/17/2023)    Readmission Risk Interventions     No data to display

## 2023-01-19 NOTE — Progress Notes (Signed)
Refusing lab draws.  Discussed importance of lab draws and reason for lab draw.  Still refused

## 2023-01-19 NOTE — Progress Notes (Signed)
Palliative Medicine Progress Note   Patient Name: Nathaniel Long       Date: 01/19/2023 DOB: 12/16/1940  Age: 82 y.o. MRN#: [id number] Attending Physician: Cathren Harsh, MD Primary Care Physician: [name], MD Admit Date: 01/05/2023  Reason for Consultation/Follow-up: {Reason for Consult:23484}  HPI/Patient Profile: 82 y.o. 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 Due to weight loss and ongoing throat pain, he had been recommended for G-tube placement. He was evaluated by IR on 4/12, but G-tube placement was not to be done due to his anatomy.   He was admitted to [facility] on 01/02/2023 direct laryngoscopy with biopsy. Found to have full necrotic posterior pharyngeal wall with palpable cervical spine.  On 4/16, he went to the OR for G-tube placement.    Palliative Medicine has been consulted for goals of care.     Subjective: ***  Objective:  Physical Exam          Vital Signs: BP (!) 175/98 (BP Location: Left Leg)   Pulse 96   Temp 98 F (36.7 C) (Oral)   Resp (!) 25   Ht 5' 9.5" (1.765 m)   Wt 59.8 kg   SpO2 91%   BMI 19.19 kg/m  SpO2: SpO2: 91 % O2 Device: O2 Device: Room Air O2 Flow Rate: O2 Flow Rate (L/min): 2 L/min    Palliative Medicine Assessment & Plan   Assessment: Principal Problem:   Sepsis due to pneumonia Active Problems:   CKD (chronic kidney disease) stage 3, GFR 30-59 ml/min   COPD (chronic obstructive pulmonary disease)   Pharyngeal carcinoma   Dyslipidemia   Protein-calorie malnutrition, severe   Anemia   Goals of care, counseling/discussion    Recommendations/Plan: ***  Goals of Care and Additional  Recommendations: Limitations on Scope of Treatment: {Recommended Scope and Preferences:21019}  Code Status:   Prognosis:  {Palliative Care Prognosis:23504}  Discharge Planning: {Palliative dispostion:23505}  Care plan was discussed with ***  Thank you for allowing the Palliative Medicine Team to assist in the care of this patient.   ***   Merry Proud, NP   Please contact Palliative Medicine Team phone at [phone] for questions and concerns.  For individual providers, please see  AMION.

## 2023-01-19 NOTE — Progress Notes (Addendum)
Brief Nutrition Support Note  RD was contacted by MD regarding transition to bolus tube feeding regimen. Pt currently with continuous tube feeds of Osmolite 1.5 infusing via pump at rate of 30 ml/hr. Pt's goal rate on continuous tube feeds remains 50 ml/hr.  04/17: 0601 - labs showing low potassium (3.0) and borderline low magnesium (1.8), pt received IV potassium chloride 10 mEq x 4 1314 - continuous tube feeds started at 20 ml/hr per [facility], thiamine 100 mg daily x 5 days per tube ordered due to refeeding risk 1726 - labs showing low potassium (3.3) and low phosphorus (2.2) which indicates refeeding syndrome  04/18: 0240 - tube feeding rate increased to 30 ml/hr per administration instructions 0656 - labs showing low potassium (2.8) and low phosphorus (1.6) which indicates ongoing refeeding syndrome, pt received IV potassium chloride 10 mEq x 5 followed by IV potassium phosphate 30 mmol x 1, MD also ordered daily K phos neutral 500 mg per tube 1700 - pt refused PM labs 2000- tube feeding rate still at 30 ml/hr per [facility]  04/19: 0323 - tube feeding rate still at 30 ml/hr per MAR 0448 - tube feeding rate still at 30 ml/hr per MAR 0500 - pt refused AM labs  Per discussion with RN this morning, pt refused AM labs. RD communicated with Palliative Care, attending MD, RN and Case Manager regarding pt's refusal of labs and inability to safely advance tube feeding regimen to goal rate or transition to goal bolus regimen due to pt actively refeeding. Untreated hypophosphatemia can cause muscle weakness, respiratory failure, seizures, cardiomyopathy, etc. Untreated severe hypokalemia may result in arrhythmia, respiratory failure, etc. Palliative Care NP spoke with wife and daughter-in-law about pt refusing labs. Wife and daughter-in-law have told pt that he cannot go home without consenting to lab draws.  Pt is not yet at goal rate on continuous tube feeding regimen, and it is unsafe for RD to advance tube  feeding regimen further without knowing pt's potassium, magnesium, and phosphorus labs so that deficiencies can be repleted and refeeding syndrome can be addressed. Care Team is aware.  Once pt reaches goal tube feeding rate and is no longer refeeding (electrolyte labs are stable), RD can transition to bolus tube feeding regimen in preparation for discharge.  Bolus tube feeding recommendations: - 1 carton (237 ml) of Osmolite 1.5 cal formula 5 x daily - Flush G-tube with 60 ml free water before and after each bolus   Bolus tube feeding regimen with free water flushes will provide 1775 kcal, 74.5 grams of protein, and 905 ml of H2O.   Total free water with flushes: 1505 ml  RD will continue to follow pt during admission.   Mertie Clause, MS, RD, LDN Inpatient Clinical Dietitian Please see AMiON for contact information.

## 2023-01-20 DIAGNOSIS — N183 Chronic kidney disease, stage 3 unspecified: Secondary | ICD-10-CM | POA: Diagnosis not present

## 2023-01-20 DIAGNOSIS — J189 Pneumonia, unspecified organism: Secondary | ICD-10-CM | POA: Diagnosis not present

## 2023-01-20 DIAGNOSIS — C140 Malignant neoplasm of pharynx, unspecified: Secondary | ICD-10-CM | POA: Diagnosis not present

## 2023-01-20 DIAGNOSIS — J42 Unspecified chronic bronchitis: Secondary | ICD-10-CM | POA: Diagnosis not present

## 2023-01-20 LAB — GLUCOSE, CAPILLARY
Glucose-Capillary: 150 mg/dL — ABNORMAL HIGH (ref 70–99)
Glucose-Capillary: 129 mg/dL — ABNORMAL HIGH (ref 70–99)
Glucose-Capillary: 132 mg/dL — ABNORMAL HIGH (ref 70–99)
Glucose-Capillary: 135 mg/dL — ABNORMAL HIGH (ref 70–99)
Glucose-Capillary: 155 mg/dL — ABNORMAL HIGH (ref 70–99)
Glucose-Capillary: 126 mg/dL — ABNORMAL HIGH (ref 70–99)

## 2023-01-20 LAB — CBC
HCT: 30 % — ABNORMAL LOW (ref 39.0–52.0)
Hemoglobin: 10.1 g/dL — ABNORMAL LOW (ref 13.0–17.0)
MCH: 29.4 pg (ref 26.0–34.0)
MCHC: 33.7 g/dL (ref 30.0–36.0)
MCV: 87.2 fL (ref 80.0–100.0)
Platelets: 294 10*3/uL (ref 150–400)
RBC: 3.44 MIL/uL — ABNORMAL LOW (ref 4.22–5.81)
RDW: 14.3 % (ref 11.5–15.5)
WBC: 10.6 10*3/uL — ABNORMAL HIGH (ref 4.0–10.5)
nRBC: 0 % (ref 0.0–0.2)

## 2023-01-20 LAB — BASIC METABOLIC PANEL
Anion gap: 10 (ref 5–15)
BUN: 17 mg/dL (ref 8–23)
CO2: 24 mmol/L (ref 22–32)
Calcium: 7.9 mg/dL — ABNORMAL LOW (ref 8.9–10.3)
Chloride: 101 mmol/L (ref 98–111)
Creatinine, Ser: 0.95 mg/dL (ref 0.61–1.24)
GFR, Estimated: >60 mL/min (ref >60)
Glucose, Bld: 154 mg/dL — ABNORMAL HIGH (ref 70–99)
Potassium: 2.7 mmol/L — CL (ref 3.5–5.1)
Sodium: 135 mmol/L (ref 135–145)

## 2023-01-20 LAB — MAGNESIUM: Magnesium: 1.8 mg/dL (ref 1.7–2.4)

## 2023-01-20 LAB — PHOSPHORUS: Phosphorus: 3 mg/dL (ref 2.5–4.6)

## 2023-01-20 LAB — CULTURE, BLOOD (ROUTINE X 2)

## 2023-01-20 MED ORDER — POTASSIUM CHLORIDE 10 MEQ/100ML IV SOLN
10.0000 meq | INTRAVENOUS | Status: AC
  Administered 2023-01-20 (×4): 10 meq via INTRAVENOUS
  Filled 2023-01-20 (×4): qty 100

## 2023-01-20 MED ORDER — SODIUM CHLORIDE 0.9 % IV SOLN
3.0000 g | Freq: Four times a day (QID) | INTRAVENOUS | Status: DC
  Administered 2023-01-20 – 2023-01-22 (×8): 3 g via INTRAVENOUS
  Filled 2023-01-20 (×9): qty 8

## 2023-01-20 MED ORDER — MAGNESIUM SULFATE 50 % IJ SOLN
3.0000 g | Freq: Once | INTRAVENOUS | Status: AC
  Administered 2023-01-20: 3 g via INTRAVENOUS
  Filled 2023-01-20: qty 6

## 2023-01-20 MED ORDER — METOPROLOL TARTRATE 5 MG/5ML IV SOLN
5.0000 mg | Freq: Four times a day (QID) | INTRAVENOUS | Status: DC
  Administered 2023-01-20 – 2023-01-22 (×9): 5 mg via INTRAVENOUS
  Filled 2023-01-20 (×9): qty 5

## 2023-01-20 MED ORDER — POTASSIUM CHLORIDE 20 MEQ PO PACK: 40.0000 meq | PACK | Freq: Every day | ORAL | Status: DC

## 2023-01-20 MED ORDER — POTASSIUM CHLORIDE 20 MEQ PO PACK
40.0000 meq | PACK | Freq: Every day | ORAL | Status: DC
  Administered 2023-01-20 – 2023-01-22 (×3): 40 meq
  Filled 2023-01-20 (×3): qty 2

## 2023-01-20 NOTE — Progress Notes (Signed)
1910 patient alert to self and place [facility] using written communication with patient family at bedside 2000 Grandson at bedside ery emotional crying concerned that patient will go home with no assistance to help care for him on hospice states grandmother is on hospice and no one comes to help her and patient needs will be greater. Educated grandson that Hospice will come evaluate patient for home and facility based on patient needs and home care situation

## 2023-01-20 NOTE — Progress Notes (Signed)
   01/20/23 1947  Assess: MEWS Score  Temp 97.9 F (36.6 C)  BP 117/66  MAP (mmHg) 80  Pulse Rate 84  ECG Heart Rate 87  Resp (!) 27  Level of Consciousness Responds to Voice  SpO2 96 %  O2 Device Room Air  Patient Activity (if Appropriate) In bed  Assess: MEWS Score  MEWS Temp 0  MEWS Systolic 0  MEWS Pulse 0  MEWS RR 2  MEWS LOC 1  MEWS Score 3  MEWS Score Color Yellow  Assess: if the MEWS score is Yellow or Red  Were vital signs taken at a resting state? Yes  Focused Assessment No change from prior assessment  Does the patient meet 2 or more of the SIRS criteria? Yes  Does the patient have a confirmed or suspected source of infection? Yes  Provider and Rapid Response Notified? No  MEWS guidelines implemented  No, vital signs rechecked  Assess: SIRS CRITERIA  SIRS Temperature  0  SIRS Pulse 0  SIRS Respirations  1  SIRS WBC 0  SIRS Score Sum  1   Patient seems comfortable in bed no concerns at this time no pain

## 2023-01-20 NOTE — Plan of Care (Signed)
  Problem: Education: Goal: Knowledge of the prescribed therapeutic regimen will improve Outcome: Progressing   Problem: Activity: Goal: Ability to tolerate increased activity will improve Outcome: Progressing   Problem: Nutrition: Goal: Maintenance of adequate nutrition will improve Outcome: Progressing   Problem: Clinical Measurements: Goal: Complications related to the disease process, condition or treatment will be avoided or minimized Outcome: Progressing   Problem: Respiratory: Goal: Will regain and/or maintain adequate ventilation Outcome: Progressing   Problem: Clinical Measurements: Goal: Will remain free from infection Outcome: Progressing Goal: Diagnostic test results will improve Outcome: Progressing Goal: Respiratory complications will improve Outcome: Progressing

## 2023-01-20 NOTE — Progress Notes (Addendum)
Pharmacy Antibiotic Note  Nathaniel Long is a 82 y.o. male admitted on 01/11/2023 for G-tube placement now with concern for  sepsis and aspiration PNA . Pharmacy has been consulted for Unasyn dosing. Cr stable at BL.  WBC down to 10.6, afebrile, remains tachycardic. NG on cultures x 4 days, follow duration of treatment.  Plan: Unasyn 3g IV q6h  Height: 5' 9.5" (176.5 cm) Weight: 62.6 kg (138 lb 0.1 oz) IBW/kg (Calculated) : 71.85  Temp (24hrs), Avg:98.7 F (37.1 C), Min:98 F (36.7 C), Max:99.6 F (37.6 C)  Recent Labs  Lab 01/24/2023 1023 01/28/2023 1155 01/08/2023 1155 01/05/2023 1403 01/30/2023 1920 01/28/2023 2311 01/17/23 0601 01/17/23 1726 01/18/23 0656 01/18/23 1013 01/20/23 0535  WBC 17.3*  --   --   --  11.7* 13.3*  --   --   --  15.7* 10.6*  CREATININE  --  1.25*   < >  --   --  1.23 1.08 1.09 0.97  --  0.95  LATICACIDVEN  --  1.1  --  1.3  --   --   --   --   --   --   --    < > = values in this interval not displayed.     Estimated Creatinine Clearance: 54 mL/min (by C-G formula based on SCr of 0.95 mg/dL).    Allergies  Allergen Reactions   Gabapentin Other (See Comments)    dizziness   Statins Other (See Comments)    Muscle weakness    Thank you for involving pharmacy in this patient's care.  Enos Fling, PharmD PGY2 Pharmacy Resident 01/20/2023 1:51 PM

## 2023-01-20 NOTE — Progress Notes (Addendum)
[facility]                                                                              Nathaniel Long, is a 82 y.o. male, DOB - [birth year], ZOX:[id number] Admit date - 01/28/2023    Outpatient Primary MD for the patient is [name], MD  LOS - 5  days  No chief complaint on file.      Brief summary   Patient is a 82 y.o. male with lung cancer, COPD, HTN, HLD, and pharyngeal carcinoma treated with radiation.  He has had persistent throat pain and been unable to maintain his weight and so was admitted on 01/13/2023 for gastric tube placement.  PEG tube was considered but could not be placed due to anatomy.  He underwent direct laryngoscopy under anesthesia yesterday with findings of "Posterior pharyngeal wall fully necrotic with spine palpable in depth of necrosis. Extends into upper esophagus that could not be entered."  Biopsies were taken and show cancer.  He was using Coke for swish and spit yesterday and was attempting to take oral pills not very successfully.  He had gastrostomy tube placement on 4/16.  When he returned from the procedure, BPs were in the 70s for an hour, he was febrile to 102, responded to IV fluids.    Assessment & Plan    Principal Problem:   Sepsis due to pneumonia, ?  Aspiration -Patient met sepsis criteria due to leukocytosis, fever, tachypnea, tachycardia, hypotension.  Possibly due to aspiration pneumonia -G-tube placed on 4/16, surgery following -Continue n.p.o., IV Unasyn,  -Blood cultures NTD  Active problems Pharyngeal carcinoma Dysphagia -Diagnosed in 05/2022, deferred total laryngectomy and decided to do radiation for primarily palliative treatment -Worsening posterior neck pain, recent inability to tolerate PO -Underwent direct laryngoscopy on 4/15 and biopsies positive for cancer with perineural invasion -Overall poor prognosis -G-tube placed on 4/16, started on tube feeds, high risk of refeeding syndrome.  -For now continuous  tube feedings, dietitian will change to bolus feedings once patient is tolerating tube feeds at goal rate and electrolytes stable  -Patient remains high risk for refeeding syndrome -Seen by oncology on 4/18 (Dr Al Pimple), appreciate recommendations, patient has not been interested in chemoradiation and not currently a candidate for surgery.  He is agreeable to palliative care and hospice.  Palliative medicine has been consulted  Severe Hypokalemia, hypophosphatemia -Potassium 2.7, magnesium 1.8, phosphorus 3.0 -Placed on aggressive potassium replacement IV and p.o. -Added magnesium IV 3 g x1  Hypertension, tachycardia -Placed on Lopressor 5 mg IV every 6 hours    Normocytic anemia  -Received 1 unit packed RBCs on 4/16 for hemoglobin of 6.3  -H&H stable   Protein-calorie malnutrition, severe -Recent severe weight loss due to inability to take PO -Albumin is 1.8 -Tube feeds started   Dyslipidemia -On Zetia as an outpatient, currently on hold -Has not been taking statins    COPD (chronic obstructive pulmonary disease) -On Stiolto as an outpatient -Currently on Brovana, Incruse, Xopenex -No longer smoking   CKD (chronic kidney disease) stage 3, GFR 30-59 ml/min -Appears to be stable at this time, at baseline 1.0-1.2  Estimated body mass index is 20.09 kg/m as calculated from the following:   Height as of this encounter: 5' 9.5" (1.765 m).   Weight as of this encounter: 62.6 kg.  Code Status: Full code DVT Prophylaxis:  SCDs Start: 01/19/2023 1039   Level of Care: Level of care: Progressive Family Communication: Discussed plan and updated patient's daughter [name] on the phone Disposition Plan:      Remains inpatient appropriate: Will plan on disposition once converted to bolus tube feeds, likely on Monday. Patient has agreed to go home with hospice.   Procedures:  Insertion of gastrostomy tube on 01/21/23 Direct laryngoscopy with biopsy on 4/15  Consultants:   General  surgery ENT  Antimicrobials:   Anti-infectives (From admission, onward)    Start     Dose/Rate Route Frequency Ordered Stop   January 21, 2023 1430  Ampicillin-Sulbactam (UNASYN) 3 g in sodium chloride 0.9 % 100 mL IVPB        3 g 200 mL/hr over 30 Minutes Intravenous Every 8 hours Jan 21, 2023 1333     01/21/2023 0600  ceFAZolin (ANCEF) IVPB 2g/100 mL premix        2 g 200 mL/hr over 30 Minutes Intravenous On call to O.R. 01/23/2023 1514 01-21-23 0800          Medications  amLODipine  5 mg Per Tube Daily   arformoterol  15 mcg Nebulization BID   And   umeclidinium bromide  1 puff Inhalation Daily   free water  100 mL Per Tube Q8H   lidocaine  1 patch Transdermal Q24H   mupirocin ointment  1 Application Nasal BID   phosphorus  500 mg Per Tube Daily   potassium chloride  40 mEq Per Tube Daily   thiamine  100 mg Per Tube Daily      Subjective:   Nathaniel Long was seen and examined today.  Resting comfortably, no acute complaints, overnight no acute issues.  Tube feeds ongoing.     Objective:   Vitals:   01/19/23 2321 01/20/23 0300 01/20/23 0731 01/20/23 0745  BP: (!) 142/106 (!) 159/82  (!) 149/84  Pulse: 99 (!) 103 (!) 103 (!) 105  Resp: 20 20 (!) 22 (!) 24  Temp: 98.6 F (37 C) 98 F (36.7 C)  99.6 F (37.6 C)  TempSrc: Oral Oral  Oral  SpO2: 94% 92% 96% 95%  Weight:  62.6 kg    Height:        Intake/Output Summary (Last 24 hours) at 01/20/2023 1120 Last data filed at 01/20/2023 0610 Gross per 24 hour  Intake 569.47 ml  Output 1800 ml  Net -1230.53 ml     Wt Readings from Last 3 Encounters:  01/20/23 62.6 kg  01/12/23 62 kg  01/12/23 62.4 kg   Physical Exam General: Sleepy, appears comfortable Cardiovascular: S1 S2 clear, RRR.  Tachycardia Respiratory: CTAB, anteriorly Gastrointestinal: Soft, abdominal binder +, G-tube +  Ext: no pedal edema bilaterally Neuro: sleepy unable to assess  Data Reviewed:  I have personally reviewed following labs    CBC Lab  Results  Component Value Date   WBC 10.6 (H) 01/20/2023   RBC 3.44 (L) 01/20/2023   HGB 10.1 (L) 01/20/2023   HCT 30.0 (L) 01/20/2023   MCV 87.2 01/20/2023   MCH 29.4 01/20/2023   PLT 294 01/20/2023   MCHC 33.7 01/20/2023   RDW 14.3 01/20/2023   LYMPHSABS 0.2 (L) 21-Jan-2023   MONOABS 0.3 2023/01/21   EOSABS 0.0 01-21-23   BASOSABS  0.0 01/12/2023     Last metabolic panel Lab Results  Component Value Date   NA 135 01/20/2023   K 2.7 (LL) 01/20/2023   CL 101 01/20/2023   CO2 24 01/20/2023   BUN 17 01/20/2023   CREATININE 0.95 01/20/2023   GLUCOSE 154 (H) 01/20/2023   GFRNONAA >60 01/20/2023   GFRAA 50 (L) 06/19/2020   CALCIUM 7.9 (L) 01/20/2023   PHOS 3.0 01/20/2023   PROT 5.0 (L) 01/12/2023   ALBUMIN 1.8 (L) 01/21/2023   BILITOT 1.1 01/17/2023   ALKPHOS 57 01/24/2023   AST 29 01/21/2023   ALT 14 01/01/2023   ANIONGAP 10 01/20/2023    CBG (last 3)  Recent Labs    01/19/23 2323 01/20/23 0500 01/20/23 0743  GLUCAP 138* 150* 129*      Coagulation Profile: No results for input(s): "INR", "PROTIME" in the last 168 hours.    Radiology Studies: I have personally reviewed the imaging studies  No results found.     Thad Ranger M.D. [facility] 01/20/2023, 11:20 AM  Available via Epic secure chat 7am-7pm After 7 pm, please refer to night coverage provider listed on amion.

## 2023-01-20 NOTE — Plan of Care (Signed)
Problem: Education: Goal: Knowledge of the prescribed therapeutic regimen will improve 01/20/2023 2208 by Aretta Nip, RN Outcome: Not Progressing 01/20/2023 2208 by Aretta Nip, RN Outcome: Not Progressing   Problem: Activity: Goal: Ability to tolerate increased activity will improve 01/20/2023 2208 by Aretta Nip, RN Outcome: Not Progressing 01/20/2023 2208 by Aretta Nip, RN Outcome: Not Progressing   Problem: Health Behavior/Discharge Planning: Goal: Identification of resources available to assist in meeting health care needs will improve 01/20/2023 2208 by Aretta Nip, RN Outcome: Not Progressing 01/20/2023 2208 by Aretta Nip, RN Outcome: Not Progressing   Problem: Nutrition: Goal: Maintenance of adequate nutrition will improve 01/20/2023 2208 by Aretta Nip, RN Outcome: Not Progressing 01/20/2023 2208 by Aretta Nip, RN Outcome: Not Progressing   Problem: Clinical Measurements: Goal: Complications related to the disease process, condition or treatment will be avoided or minimized 01/20/2023 2208 by Aretta Nip, RN Outcome: Not Progressing 01/20/2023 2208 by Aretta Nip, RN Outcome: Not Progressing   Problem: Respiratory: Goal: Will regain and/or maintain adequate ventilation 01/20/2023 2208 by Aretta Nip, RN Outcome: Not Progressing 01/20/2023 2208 by Aretta Nip, RN Outcome: Not Progressing   Problem: Skin Integrity: Goal: Demonstration of wound healing without infection will improve 01/20/2023 2208 by Aretta Nip, RN Outcome: Not Progressing 01/20/2023 2208 by Aretta Nip, RN Outcome: Not Progressing   Problem: Education: Goal: Knowledge of General Education information will improve Description: Including pain rating scale, medication(s)/side effects and non-pharmacologic comfort measures 01/20/2023 2208 by Aretta Nip, RN Outcome: Not Progressing 01/20/2023 2208 by Aretta Nip, RN Outcome: Not  Progressing   Problem: Health Behavior/Discharge Planning: Goal: Ability to manage health-related needs will improve 01/20/2023 2208 by Aretta Nip, RN Outcome: Not Progressing 01/20/2023 2208 by Aretta Nip, RN Outcome: Not Progressing   Problem: Clinical Measurements: Goal: Ability to maintain clinical measurements within normal limits will improve 01/20/2023 2208 by Aretta Nip, RN Outcome: Not Progressing 01/20/2023 2208 by Aretta Nip, RN Outcome: Not Progressing Goal: Will remain free from infection 01/20/2023 2208 by Aretta Nip, RN Outcome: Not Progressing 01/20/2023 2208 by Aretta Nip, RN Outcome: Not Progressing Goal: Diagnostic test results will improve 01/20/2023 2208 by Aretta Nip, RN Outcome: Not Progressing 01/20/2023 2208 by Aretta Nip, RN Outcome: Not Progressing Goal: Respiratory complications will improve 01/20/2023 2208 by Aretta Nip, RN Outcome: Not Progressing 01/20/2023 2208 by Aretta Nip, RN Outcome: Not Progressing Goal: Cardiovascular complication will be avoided 01/20/2023 2208 by Aretta Nip, RN Outcome: Not Progressing 01/20/2023 2208 by Aretta Nip, RN Outcome: Not Progressing   Problem: Nutrition: Goal: Adequate nutrition will be maintained 01/20/2023 2208 by Aretta Nip, RN Outcome: Not Progressing 01/20/2023 2208 by Aretta Nip, RN Outcome: Not Progressing   Problem: Coping: Goal: Level of anxiety will decrease 01/20/2023 2208 by Aretta Nip, RN Outcome: Not Progressing 01/20/2023 2208 by Aretta Nip, RN Outcome: Not Progressing   Problem: Elimination: Goal: Will not experience complications related to bowel motility 01/20/2023 2208 by Aretta Nip, RN Outcome: Not Progressing 01/20/2023 2208 by Aretta Nip, RN Outcome: Not Progressing Goal: Will not experience complications related to urinary retention 01/20/2023 2208 by Aretta Nip, RN Outcome: Not  Progressing 01/20/2023 2208 by Aretta Nip, RN Outcome: Not Progressing   Problem: Pain Managment: Goal: General experience of comfort will improve 01/20/2023 2208 by Aretta Nip, RN Outcome: Not  Progressing 01/20/2023 2208 by Aretta Nip, RN Outcome: Not Progressing   Problem: Safety: Goal: Ability to remain free from injury will improve 01/20/2023 2208 by Aretta Nip, RN Outcome: Not Progressing 01/20/2023 2208 by Aretta Nip, RN Outcome: Not Progressing   Problem: Skin Integrity: Goal: Risk for impaired skin integrity will decrease 01/20/2023 2208 by Aretta Nip, RN Outcome: Not Progressing 01/20/2023 2208 by Aretta Nip, RN Outcome: Not Progressing   Problem: Fluid Volume: Goal: Hemodynamic stability will improve 01/20/2023 2208 by Aretta Nip, RN Outcome: Not Progressing 01/20/2023 2208 by Aretta Nip, RN Outcome: Not Progressing   Problem: Clinical Measurements: Goal: Diagnostic test results will improve 01/20/2023 2208 by Aretta Nip, RN Outcome: Not Progressing 01/20/2023 2208 by Aretta Nip, RN Outcome: Not Progressing Goal: Signs and symptoms of infection will decrease 01/20/2023 2208 by Aretta Nip, RN Outcome: Not Progressing 01/20/2023 2208 by Aretta Nip, RN Outcome: Not Progressing   Problem: Respiratory: Goal: Ability to maintain adequate ventilation will improve 01/20/2023 2208 by Aretta Nip, RN Outcome: Not Progressing 01/20/2023 2208 by Aretta Nip, RN Outcome: Not Progressing

## 2023-01-21 DIAGNOSIS — Z7189 Other specified counseling: Secondary | ICD-10-CM | POA: Diagnosis not present

## 2023-01-21 DIAGNOSIS — N183 Chronic kidney disease, stage 3 unspecified: Secondary | ICD-10-CM | POA: Diagnosis not present

## 2023-01-21 DIAGNOSIS — E43 Unspecified severe protein-calorie malnutrition: Secondary | ICD-10-CM | POA: Diagnosis not present

## 2023-01-21 DIAGNOSIS — C140 Malignant neoplasm of pharynx, unspecified: Secondary | ICD-10-CM | POA: Diagnosis not present

## 2023-01-21 DIAGNOSIS — J189 Pneumonia, unspecified organism: Secondary | ICD-10-CM | POA: Diagnosis not present

## 2023-01-21 DIAGNOSIS — E785 Hyperlipidemia, unspecified: Secondary | ICD-10-CM | POA: Diagnosis not present

## 2023-01-21 LAB — GLUCOSE, CAPILLARY
Glucose-Capillary: 136 mg/dL — ABNORMAL HIGH (ref 70–99)
Glucose-Capillary: 139 mg/dL — ABNORMAL HIGH (ref 70–99)
Glucose-Capillary: 139 mg/dL — ABNORMAL HIGH (ref 70–99)
Glucose-Capillary: 140 mg/dL — ABNORMAL HIGH (ref 70–99)
Glucose-Capillary: 142 mg/dL — ABNORMAL HIGH (ref 70–99)
Glucose-Capillary: 146 mg/dL — ABNORMAL HIGH (ref 70–99)

## 2023-01-21 LAB — BASIC METABOLIC PANEL
Anion gap: 9 (ref 5–15)
BUN: 19 mg/dL (ref 8–23)
CO2: 23 mmol/L (ref 22–32)
Calcium: 7.8 mg/dL — ABNORMAL LOW (ref 8.9–10.3)
Chloride: 103 mmol/L (ref 98–111)
Creatinine, Ser: 1.04 mg/dL (ref 0.61–1.24)
GFR, Estimated: >60 mL/min (ref >60)
Glucose, Bld: 138 mg/dL — ABNORMAL HIGH (ref 70–99)
Potassium: 3.5 mmol/L (ref 3.5–5.1)
Sodium: 135 mmol/L (ref 135–145)

## 2023-01-21 LAB — CBC
HCT: 28.6 % — ABNORMAL LOW (ref 39.0–52.0)
Hemoglobin: 9.5 g/dL — ABNORMAL LOW (ref 13.0–17.0)
MCH: 29.4 pg (ref 26.0–34.0)
MCHC: 33.2 g/dL (ref 30.0–36.0)
MCV: 88.5 fL (ref 80.0–100.0)
Platelets: 302 10*3/uL (ref 150–400)
RBC: 3.23 MIL/uL — ABNORMAL LOW (ref 4.22–5.81)
RDW: 14.6 % (ref 11.5–15.5)
WBC: 12.8 10*3/uL — ABNORMAL HIGH (ref 4.0–10.5)
nRBC: 0 % (ref 0.0–0.2)

## 2023-01-21 LAB — PHOSPHORUS: Phosphorus: 3.5 mg/dL (ref 2.5–4.6)

## 2023-01-21 LAB — CULTURE, BLOOD (ROUTINE X 2): Culture: NO GROWTH

## 2023-01-21 LAB — MAGNESIUM: Magnesium: 2.5 mg/dL — ABNORMAL HIGH (ref 1.7–2.4)

## 2023-01-21 NOTE — Progress Notes (Signed)
NUTRITION NOTE RD working remotely.  Page received from RN at ~1040 today and RD added to multidisciplinary secure chat following call to RN. Patient was last assessed in person by a RD on 4/19 at which time note outlined that Osmolite 1.5 at 30 ml/hr with goal rate of 50 ml/hr. Patient refused AM labs on 4/19.  Labs able to be drawn 4/20 AM and noted Mg and Phos WDL, K: 2.7 mmol/l. He is ordered 500 mg KPhos daily per tube, 40 mEq Klor-Con daily per tube started 4/20 AM following labs, patient received 10 mEq IV KCl x4 runs on 4/20 following labs, and he had received 100 mg thiamine/day per tube 4/17-4/21.  Based on these factors, RD feels comfortable having Osmolite 1.5 increased to 40 ml/hr at this time and then increase to goal rate of 50 ml/hr in 12 hours. Will request that K, Mg, and Phos be checked this evening prior to increase to goal rate in order to have values from today following increase. Patient has been receiving Osmolite 1.5 @ 30 ml/hr since 4/18.      Nathaniel Gammon, MS, RD, LDN, CNSC Clinical Dietitian PRN/Relief staff On-call/weekend pager # available in [facility]

## 2023-01-21 NOTE — Progress Notes (Signed)
[facility]                                                                              Nathaniel Long, is a 82 y.o. male, DOB - [birth year], ZOX:[id number] Admit date - 01/29/2023    Outpatient Primary MD for the patient is [name], MD  LOS - 6  days  No chief complaint on file.      Brief summary   Patient is a 82 y.o. male with lung cancer, COPD, HTN, HLD, and pharyngeal carcinoma treated with radiation.  He has had persistent throat pain and been unable to maintain his weight and so was admitted on 01/24/2023 for gastric tube placement.  PEG tube was considered but could not be placed due to anatomy.  He underwent direct laryngoscopy under anesthesia yesterday with findings of "Posterior pharyngeal wall fully necrotic with spine palpable in depth of necrosis. Extends into upper esophagus that could not be entered."  Biopsies were taken and show cancer.  He was using Coke for swish and spit yesterday and was attempting to take oral pills not very successfully.  He had gastrostomy tube placement on 4/16.  When he returned from the procedure, BPs were in the 70s for an hour, he was febrile to 102, responded to IV fluids.    Assessment & Plan    Principal Problem:   Sepsis due to pneumonia, ?  Aspiration -Patient met sepsis criteria due to leukocytosis, fever, tachypnea, tachycardia, hypotension.  Possibly due to aspiration pneumonia -G-tube placed on 4/16, surgery following -Continue n.p.o., IV Unasyn,  -Blood cultures NTD  Active problems Pharyngeal carcinoma  -Diagnosed in 05/2022, deferred total laryngectomy and decided to do radiation for primarily palliative treatment -Worsening posterior neck pain, recent inability to tolerate PO -Underwent direct laryngoscopy on 4/15 and biopsies positive for cancer with perineural invasion -Seen by oncology on 4/18 (Dr Al Pimple), appreciate recommendations, patient has not been interested in chemoradiation and not currently a  candidate for surgery.  He is agreeable to palliative care and hospice.  Palliative medicine has been consulted.  Overall poor prognosis  Dysphagia -G-tube placed on 4/16 -For now continuous tube feedings, dietitian will change to bolus feedings once patient is tolerating tube feeds at goal rate and electrolytes stable  -Patient remains high risk for refeeding syndrome -Still at 30 cc/ hour, will increase to 40 cc/ hour as tolerated and further increase to goal rate of 50 cc/hr, then subsequently bolus feeding hopefully tomorrow if tolerated.  Dietitian consult.  Severe Hypokalemia, hypophosphatemia -Potassium 3.5, continue aggressive replacement, high risk for refeeding syndrome  Hypertension, tachycardia -Continue Lopressor 5 mg IV every 6 hours    Normocytic anemia  -Received 1 unit packed RBCs on 4/16 for hemoglobin of 6.3  -H&H stable   Protein-calorie malnutrition, severe -Recent severe weight loss due to inability to take PO -Albumin is 1.8 -Tube feeds started   Dyslipidemia -On Zetia as an outpatient, currently on hold -Has not been taking statins    COPD (chronic obstructive pulmonary disease) -On Stiolto as an outpatient -Currently on Brovana, Incruse, Xopenex -No longer smoking   CKD (chronic kidney disease) stage  3, GFR 30-59 ml/min -Appears to be stable at this time, at baseline 1.0-1.2   Estimated body mass index is 18.29 kg/m as calculated from the following:   Height as of this encounter: 5' 9.5" (1.765 m).   Weight as of this encounter: 57 kg.  Code Status: Full code DVT Prophylaxis:  SCDs Start: 01/05/2023 1039   Level of Care: Level of care: Progressive Family Communication:  updated patient's daughter [name] on the phone on 4/20, updated grandson at the bedside today. Disposition Plan:      Remains inpatient appropriate: Will plan on disposition once converted to bolus tube feeds, likely on Monday. Patient has agreed to go home with  hospice.   Procedures:  Insertion of gastrostomy tube on 4/16 Direct laryngoscopy with biopsy on 4/15  Consultants:   General surgery ENT  Antimicrobials:   Anti-infectives (From admission, onward)    Start     Dose/Rate Route Frequency Ordered Stop   01/20/23 1400  Ampicillin-Sulbactam (UNASYN) 3 g in sodium chloride 0.9 % 100 mL IVPB        3 g 200 mL/hr over 30 Minutes Intravenous Every 6 hours 01/20/23 1351     01/21/2023 1430  Ampicillin-Sulbactam (UNASYN) 3 g in sodium chloride 0.9 % 100 mL IVPB  Status:  Discontinued        3 g 200 mL/hr over 30 Minutes Intravenous Every 8 hours 01/18/2023 1333 01/20/23 1351   01/10/2023 0600  ceFAZolin (ANCEF) IVPB 2g/100 mL premix        2 g 200 mL/hr over 30 Minutes Intravenous On call to O.R. 01/10/2023 1514 01/19/2023 0800          Medications  arformoterol  15 mcg Nebulization BID   And   umeclidinium bromide  1 puff Inhalation Daily   free water  100 mL Per Tube Q8H   lidocaine  1 patch Transdermal Q24H   metoprolol tartrate  5 mg Intravenous Q6H   phosphorus  500 mg Per Tube Daily   potassium chloride  40 mEq Per Tube Daily   thiamine  100 mg Per Tube Daily      Subjective:   Nathaniel Long was seen and examined today.  Resting comfortably, no acute complaints, tube feeds still 30 cc an hour.  No acute complaints.  Grandson at the bedside   Objective:   Vitals:   01/21/23 0330 01/21/23 0400 01/21/23 0637 01/21/23 0912  BP: 122/81 125/80    Pulse: 87 86 77   Resp: (!) 25 (!) 25 (!) 27   Temp: 98.3 F (36.8 C)     TempSrc: Oral     SpO2: 96% 95% 95% 96%  Weight:   57 kg   Height:        Intake/Output Summary (Last 24 hours) at 01/21/2023 1014 Last data filed at 01/21/2023 0507 Gross per 24 hour  Intake 1282.04 ml  Output 1850 ml  Net -567.96 ml     Wt Readings from Last 3 Encounters:  01/21/23 57 kg  01/12/23 62 kg  01/12/23 62.4 kg   Physical Exam General: Sleepy, NAD, arousable Cardiovascular: S1 S2  clear, RRR.  Respiratory: CTAB anteriorly Gastrointestinal: Abdominal binder on, G-tube+ Ext: no pedal edema bilaterally Neuro:  Psych: sleepy  Data Reviewed:  I have personally reviewed following labs    CBC Lab Results  Component Value Date   WBC 12.8 (H) 01/21/2023   RBC 3.23 (L) 01/21/2023   HGB 9.5 (L) 01/21/2023   HCT 28.6 (  L) 01/21/2023   MCV 88.5 01/21/2023   MCH 29.4 01/21/2023   PLT 302 01/21/2023   MCHC 33.2 01/21/2023   RDW 14.6 01/21/2023   LYMPHSABS 0.2 (L) 01/21/2023   MONOABS 0.3 01/30/2023   EOSABS 0.0 01/12/2023   BASOSABS 0.0 01/01/2023     Last metabolic panel Lab Results  Component Value Date   NA 135 01/21/2023   K 3.5 01/21/2023   CL 103 01/21/2023   CO2 23 01/21/2023   BUN 19 01/21/2023   CREATININE 1.04 01/21/2023   GLUCOSE 138 (H) 01/21/2023   GFRNONAA >60 01/21/2023   GFRAA 50 (L) 06/19/2020   CALCIUM 7.8 (L) 01/21/2023   PHOS 3.0 01/20/2023   PROT 5.0 (L) 01/25/2023   ALBUMIN 1.8 (L) 01/09/2023   BILITOT 1.1 01/21/2023   ALKPHOS 57 01/23/2023   AST 29 01/21/2023   ALT 14 01/27/2023   ANIONGAP 9 01/21/2023    CBG (last 3)  Recent Labs    01/20/23 2314 01/21/23 0330 01/21/23 0747  GLUCAP 126* 136* 140*      Coagulation Profile: No results for input(s): "INR", "PROTIME" in the last 168 hours.    Radiology Studies: I have personally reviewed the imaging studies  No results found.     Thad Ranger M.D. [facility] 01/21/2023, 10:14 AM  Available via Epic secure chat 7am-7pm After 7 pm, please refer to night coverage provider listed on amion.

## 2023-01-21 NOTE — Progress Notes (Signed)
Palliative Medicine Progress Note   Patient Name: Nathaniel Long       Date: 01/21/2023 DOB: 25-Mar-1941  Age: 82 y.o. MRN#: [id number] Attending Physician: Cathren Harsh, MD Primary Care Physician: [name], MD Admit Date: 01/05/2023  Reason for Consultation/Follow-up: {Reason for Consult:23484}  HPI/Patient Profile: 82 y.o. 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 Due to weight loss and ongoing throat pain, he had been recommended for G-tube placement. He was evaluated by IR on 4/12, but G-tube placement was not done due to his anatomy.   He was admitted to [facility] on 01/23/2023 for direct laryngoscopy with biopsy. Found to have full necrotic posterior pharyngeal wall with palpable cervical spine.  On 4/16, he went to the OR for G-tube placement.    Palliative Medicine has been consulted for goals of care.   Subjective: ***  Objective:  Physical Exam Vitals reviewed.  Constitutional:      General: He is not in acute distress.    Appearance: He is ill-appearing.  HENT:     Head:     Comments: Hard of hearing Pulmonary:     Effort: Pulmonary effort is normal.             Vital Signs: BP 113/80 (BP Location: Left Arm)   Pulse 93   Temp 97.7 F (36.5 C) (Oral)   Resp (!) 34   Ht 5' 9.5" (1.765 m)   Wt 57 kg   SpO2 95%   BMI 18.29 kg/m  SpO2: SpO2: 95 % O2 Device: O2 Device: Room Air O2 Flow Rate: O2 Flow Rate (L/min): 2 L/min   LBM: Last BM Date :  (unable to tell)     Palliative Assessment/Data: ***     Palliative Medicine Assessment & Plan   Assessment: Principal Problem:   Sepsis due to pneumonia Active Problems:   CKD (chronic  kidney disease) stage 3, GFR 30-59 ml/min   COPD (chronic obstructive pulmonary disease)   Pharyngeal carcinoma   Dyslipidemia   Protein-calorie malnutrition, severe   Anemia   Goals of care, counseling/discussion    Recommendations/Plan: ***  Goals of Care and Additional Recommendations: Limitations on Scope of Treatment: {Recommended Scope and Preferences:21019}  Code  Status:   Prognosis:  {Palliative Care Prognosis:23504}  Discharge Planning: {Palliative dispostion:23505}  Care plan was discussed with ***  Thank you for allowing the Palliative Medicine Team to assist in the care of this patient.   ***   Lavena Bullion, NP   Please contact Palliative Medicine Team phone at [phone] for questions and concerns.  For individual providers, please see AMION.

## 2023-01-22 ENCOUNTER — Inpatient Hospital Stay (HOSPITAL_COMMUNITY): Payer: Medicare Other

## 2023-01-22 ENCOUNTER — Inpatient Hospital Stay: Payer: Medicare Other

## 2023-01-22 ENCOUNTER — Encounter: Payer: Self-pay | Admitting: Radiation Oncology

## 2023-01-22 ENCOUNTER — Ambulatory Visit: Payer: Medicare Other

## 2023-01-22 ENCOUNTER — Telehealth: Payer: Self-pay

## 2023-01-22 DIAGNOSIS — Z515 Encounter for palliative care: Secondary | ICD-10-CM | POA: Diagnosis not present

## 2023-01-22 DIAGNOSIS — J189 Pneumonia, unspecified organism: Secondary | ICD-10-CM | POA: Diagnosis not present

## 2023-01-22 DIAGNOSIS — C140 Malignant neoplasm of pharynx, unspecified: Secondary | ICD-10-CM | POA: Diagnosis not present

## 2023-01-22 DIAGNOSIS — E43 Unspecified severe protein-calorie malnutrition: Secondary | ICD-10-CM | POA: Diagnosis not present

## 2023-01-22 DIAGNOSIS — N183 Chronic kidney disease, stage 3 unspecified: Secondary | ICD-10-CM | POA: Diagnosis not present

## 2023-01-22 DIAGNOSIS — E785 Hyperlipidemia, unspecified: Secondary | ICD-10-CM | POA: Diagnosis not present

## 2023-01-22 LAB — CBC
HCT: 29.6 % — ABNORMAL LOW (ref 39.0–52.0)
Hemoglobin: 9.2 g/dL — ABNORMAL LOW (ref 13.0–17.0)
MCH: 28.8 pg (ref 26.0–34.0)
MCHC: 31.1 g/dL (ref 30.0–36.0)
MCV: 92.8 fL (ref 80.0–100.0)
Platelets: 345 10*3/uL (ref 150–400)
RBC: 3.19 MIL/uL — ABNORMAL LOW (ref 4.22–5.81)
RDW: 14.7 % (ref 11.5–15.5)
WBC: 17.4 10*3/uL — ABNORMAL HIGH (ref 4.0–10.5)
nRBC: 0 % (ref 0.0–0.2)

## 2023-01-22 LAB — GLUCOSE, CAPILLARY
Glucose-Capillary: 141 mg/dL — ABNORMAL HIGH (ref 70–99)
Glucose-Capillary: 167 mg/dL — ABNORMAL HIGH (ref 70–99)
Glucose-Capillary: 138 mg/dL — ABNORMAL HIGH (ref 70–99)
Glucose-Capillary: 157 mg/dL — ABNORMAL HIGH (ref 70–99)

## 2023-01-22 LAB — AMMONIA: Ammonia: 38 umol/L — ABNORMAL HIGH (ref 9–35)

## 2023-01-22 LAB — COMPREHENSIVE METABOLIC PANEL
ALT: 30 U/L (ref 0–44)
AST: 30 U/L (ref 15–41)
Albumin: 1.7 g/dL — ABNORMAL LOW (ref 3.5–5.0)
Alkaline Phosphatase: 106 U/L (ref 38–126)
Anion gap: 11 (ref 5–15)
BUN: 24 mg/dL — ABNORMAL HIGH (ref 8–23)
CO2: 22 mmol/L (ref 22–32)
Calcium: 7.9 mg/dL — ABNORMAL LOW (ref 8.9–10.3)
Chloride: 107 mmol/L (ref 98–111)
Creatinine, Ser: 1.14 mg/dL (ref 0.61–1.24)
GFR, Estimated: >60 mL/min (ref >60)
Glucose, Bld: 148 mg/dL — ABNORMAL HIGH (ref 70–99)
Potassium: 4 mmol/L (ref 3.5–5.1)
Sodium: 140 mmol/L (ref 135–145)
Total Bilirubin: 0.7 mg/dL (ref 0.3–1.2)
Total Protein: 5.8 g/dL — ABNORMAL LOW (ref 6.5–8.1)

## 2023-01-22 LAB — BLOOD GAS, ARTERIAL
Acid-Base Excess: 0.8 mmol/L (ref 0.0–2.0)
Bicarbonate: 27.1 mmol/L (ref 20.0–28.0)
Drawn by: 137461
O2 Saturation: 94.5 %
Patient temperature: 37
pCO2 arterial: 49 mmHg — ABNORMAL HIGH (ref 32–48)
pH, Arterial: 7.35 (ref 7.35–7.45)
pO2, Arterial: 70 mmHg — ABNORMAL LOW (ref 83–108)

## 2023-01-22 LAB — MAGNESIUM: Magnesium: 2.4 mg/dL (ref 1.7–2.4)

## 2023-01-22 LAB — PHOSPHORUS: Phosphorus: 4.2 mg/dL (ref 2.5–4.6)

## 2023-01-22 LAB — BRAIN NATRIURETIC PEPTIDE: B Natriuretic Peptide: 706.2 pg/mL — ABNORMAL HIGH (ref 0.0–100.0)

## 2023-01-22 LAB — PROCALCITONIN: Procalcitonin: 0.35 ng/mL

## 2023-01-22 MED ORDER — NALOXONE HCL 0.4 MG/ML IJ SOLN
0.4000 mg | INTRAMUSCULAR | Status: DC | PRN
  Administered 2023-01-22 (×2): 0.4 mg via INTRAVENOUS
  Filled 2023-01-22: qty 1

## 2023-01-22 MED ORDER — LORAZEPAM 2 MG/ML IJ SOLN: 1.0000 mg | INTRAMUSCULAR | Status: DC | PRN

## 2023-01-22 MED ORDER — IPRATROPIUM-ALBUTEROL 0.5-2.5 (3) MG/3ML IN SOLN
3.0000 mL | Freq: Four times a day (QID) | RESPIRATORY_TRACT | Status: DC
  Administered 2023-01-22: 3 mL via RESPIRATORY_TRACT

## 2023-01-22 MED ORDER — HALOPERIDOL LACTATE 5 MG/ML IJ SOLN: 0.5000 mg | INTRAMUSCULAR | Status: DC | PRN

## 2023-01-22 MED ORDER — LACTULOSE 10 GM/15ML PO SOLN
30.0000 g | Freq: Once | ORAL | Status: DC
  Filled 2023-01-22: qty 45

## 2023-01-22 MED ORDER — FUROSEMIDE 10 MG/ML IJ SOLN
20.0000 mg | Freq: Once | INTRAMUSCULAR | Status: AC
  Administered 2023-01-22: 20 mg via INTRAVENOUS

## 2023-01-22 MED ORDER — NALOXONE HCL 0.4 MG/ML IJ SOLN
INTRAMUSCULAR | Status: AC
  Filled 2023-01-22: qty 1

## 2023-01-22 MED ORDER — FENTANYL CITRATE PF 50 MCG/ML IJ SOSY: 12.5000 ug | PREFILLED_SYRINGE | INTRAMUSCULAR | Status: DC | PRN

## 2023-01-22 MED ORDER — IPRATROPIUM-ALBUTEROL 0.5-2.5 (3) MG/3ML IN SOLN
RESPIRATORY_TRACT | Status: AC
  Administered 2023-01-22: 3 mL
  Filled 2023-01-22: qty 3

## 2023-01-22 MED ORDER — MORPHINE BOLUS VIA INFUSION: 1.0000 mg | INTRAVENOUS | Status: DC | PRN

## 2023-01-22 MED ORDER — GLYCOPYRROLATE 0.2 MG/ML IJ SOLN
0.2000 mg | INTRAMUSCULAR | Status: DC | PRN
  Administered 2023-01-22: 0.2 mg via INTRAVENOUS
  Filled 2023-01-22 (×2): qty 1

## 2023-01-22 MED ORDER — ACETAMINOPHEN 325 MG PO TABS: 650.0000 mg | ORAL_TABLET | Freq: Four times a day (QID) | ORAL | Status: DC | PRN

## 2023-01-22 MED ORDER — POLYVINYL ALCOHOL 1.4 % OP SOLN: 1.0000 [drp] | Freq: Four times a day (QID) | OPHTHALMIC | Status: DC | PRN

## 2023-01-22 MED ORDER — MORPHINE SULFATE (PF) 2 MG/ML IV SOLN
1.0000 mg | INTRAVENOUS | Status: DC | PRN
  Administered 2023-01-22: 2 mg via INTRAVENOUS
  Filled 2023-01-22: qty 1

## 2023-01-22 MED ORDER — BIOTENE DRY MOUTH MT LIQD: 15.0000 mL | OROMUCOSAL | Status: DC | PRN

## 2023-01-22 MED ORDER — METOPROLOL TARTRATE 5 MG/5ML IV SOLN: 2.5000 mg | Freq: Four times a day (QID) | INTRAVENOUS | Status: DC

## 2023-01-22 MED ORDER — ACETAMINOPHEN 650 MG RE SUPP: 650.0000 mg | Freq: Four times a day (QID) | RECTAL | Status: DC | PRN

## 2023-01-22 MED ORDER — MORPHINE 100MG IN NS 100ML (1MG/ML) PREMIX INFUSION
1.0000 mg/h | INTRAVENOUS | Status: DC
  Administered 2023-01-22: 2 mg/h via INTRAVENOUS
  Filled 2023-01-22: qty 100

## 2023-01-22 MED ORDER — FUROSEMIDE 10 MG/ML IJ SOLN
40.0000 mg | Freq: Once | INTRAMUSCULAR | Status: DC
  Filled 2023-01-22: qty 4

## 2023-01-23 ENCOUNTER — Ambulatory Visit: Payer: Self-pay | Admitting: Radiation Oncology

## 2023-01-23 LAB — CULTURE, BLOOD (ROUTINE X 2)

## 2023-01-24 ENCOUNTER — Ambulatory Visit: Payer: Medicare Other

## 2023-01-24 ENCOUNTER — Encounter: Payer: Medicare Other | Admitting: Nurse Practitioner

## 2023-01-25 LAB — CULTURE, BLOOD (ROUTINE X 2)

## 2023-01-26 ENCOUNTER — Inpatient Hospital Stay: Payer: Medicare Other

## 2023-01-26 LAB — CULTURE, BLOOD (ROUTINE X 2): Culture: NO GROWTH

## 2023-01-27 LAB — CULTURE, BLOOD (ROUTINE X 2): Culture: NO GROWTH

## 2023-01-29 ENCOUNTER — Ambulatory Visit: Payer: Medicare Other

## 2023-01-29 ENCOUNTER — Inpatient Hospital Stay: Payer: Medicare Other

## 2023-01-31 ENCOUNTER — Inpatient Hospital Stay: Payer: Medicare Other

## 2023-01-31 NOTE — Progress Notes (Addendum)
[facility]                                                                              Nathaniel Long, is a 82 y.o. male, DOB - 1940/10/11, XLK:[id number] Admit date - 01/28/2023    Outpatient Primary MD for the patient is [name], MD  LOS - 7  days  No chief complaint on file.      Brief summary   Patient is a 82 y.o. male with lung cancer, COPD, HTN, HLD, and pharyngeal carcinoma treated with radiation.  He has had persistent throat pain and been unable to maintain his weight and so was admitted on 01/08/2023 for gastric tube placement.  PEG tube was considered but could not be placed due to anatomy.  He underwent direct laryngoscopy under anesthesia yesterday with findings of "Posterior pharyngeal wall fully necrotic with spine palpable in depth of necrosis. Extends into upper esophagus that could not be entered."  Biopsies were taken and show cancer.  He was using Coke for swish and spit yesterday and was attempting to take oral pills not very successfully.  He had gastrostomy tube placement on 4/16.  When he returned from the procedure, BPs were in the 70s for an hour, he was febrile to 102, responded to IV fluids.    Assessment & Plan    Principal Problem:   Sepsis due to pneumonia, ?  Aspiration -Patient met sepsis criteria due to leukocytosis, fever, tachypnea, tachycardia, hypotension.  Possibly due to aspiration pneumonia -G-tube placed on 4/16, surgery following -Continue n.p.o., IV Unasyn,  -Blood cultures NTD  Active problems Acute metabolic encephalopathy, lethargy -Likely due to morphine 4 mg received at 2:36 AM this morning -Patient noted to be very lethargic, somnolent, not following any commands -Received Narcan 0.4 mg x 2 -ABG showed pH 7.35, pCO2 49, pO2 70 - ammonia level 38, lactulose 30 g via tube x 1 -Procalcitonin 0.35, WBC is trending up 17.4, follow blood cultures, continue IV Unasyn -Chest x-ray with no new worsening infiltrates.     Pharyngeal carcinoma -Diagnosed in 05/2022, deferred total laryngectomy and decided to do radiation for primarily palliative treatment -Worsening posterior neck pain, recent inability to tolerate PO -Underwent direct laryngoscopy on 4/15 and biopsies positive for cancer with perineural invasion -Seen by oncology on 4/18 (Dr Al Pimple), appreciate recommendations, patient has not been interested in chemoradiation and not currently a candidate for surgery.  He is agreeable to palliative care and hospice.  Palliative medicine has been consulted.  Overall poor prognosis  Dysphagia -G-tube placed on 4/16 -For now continuous tube feedings, dietitian will change to bolus feedings once patient is tolerating tube feeds at goal rate and electrolytes stable  -Patient remains high risk for refeeding syndrome -Currently tube feeds at goal rate, dietitian following, will need to change to bolus feeds  Severe Hypokalemia, hypophosphatemia -Stable, replace as needed   Hypertension, tachycardia -Continue Lopressor 5 mg IV every 6 hours    Normocytic anemia  -Received 1 unit packed RBCs on 4/16 for hemoglobin of 6.3  -H&H stable   Protein-calorie malnutrition, severe -Recent severe weight loss due to inability to take  PO -Albumin is 1.8 -Tube feeds started   Dyslipidemia -On Zetia as an outpatient, currently on hold -Has not been taking statins    COPD (chronic obstructive pulmonary disease) -On Stiolto as an outpatient -Currently on Brovana, Incruse, Xopenex -No longer smoking   CKD (chronic kidney disease) stage 3, GFR 30-59 ml/min -Appears to be stable at this time, at baseline 1.0-1.2  Goals of care: Called and discussed with patient's daughter [name] on the phone today.  Updated the events from last night and this morning.  Patient is very somnolent, clearly aspirating with increasing the tube feeds towards goal.  Overall poor prognosis with recurrent pharyngeal carcinoma, dysphagia,  aspirating.  Palliative trajectory was discussed with patient and family by oncology. -Discussed about CODE STATUS with patient's daughter who is very realistic however patient's wife wants to visit him before changing the CODE STATUS or GOC.  They are leaning towards the comfort care, updated palliative medicine to follow-up, will need GOC.    Estimated body mass index is 18.8 kg/m as calculated from the following:   Height as of this encounter: 5' 9.5" (1.765 m).   Weight as of this encounter: 58.6 kg.  Code Status: Full code DVT Prophylaxis:  SCDs Start: 01/21/2023 1039   Level of Care: Level of care: Progressive Family Communication: Patient's daughter, [name] on the phone today Disposition Plan:      Remains inpatient appropriate:     Procedures:  Insertion of gastrostomy tube on 4/16 Direct laryngoscopy with biopsy on 4/15  Consultants:   General surgery ENT  Antimicrobials:   Anti-infectives (From admission, onward)    Start     Dose/Rate Route Frequency Ordered Stop   01/20/23 1400  Ampicillin-Sulbactam (UNASYN) 3 g in sodium chloride 0.9 % 100 mL IVPB        3 g 200 mL/hr over 30 Minutes Intravenous Every 6 hours 01/20/23 1351     01/08/2023 1430  Ampicillin-Sulbactam (UNASYN) 3 g in sodium chloride 0.9 % 100 mL IVPB  Status:  Discontinued        3 g 200 mL/hr over 30 Minutes Intravenous Every 8 hours 01/07/2023 1333 01/20/23 1351   01/12/2023 0600  ceFAZolin (ANCEF) IVPB 2g/100 mL premix        2 g 200 mL/hr over 30 Minutes Intravenous On call to O.R. 01/17/2023 1514 01/03/2023 0800          Medications  arformoterol  15 mcg Nebulization BID   And   umeclidinium bromide  1 puff Inhalation Daily   free water  100 mL Per Tube Q8H   lidocaine  1 patch Transdermal Q24H   metoprolol tartrate  5 mg Intravenous Q6H   naloxone       phosphorus  500 mg Per Tube Daily   potassium chloride  40 mEq Per Tube Daily      Subjective:   Adelfo Diebel was seen and  examined today.  Received morphine 4 mg IV overnight and this morning noted to be poorly responsive, somnolent, gave Narcan 0.4 mg x 2.     Objective:   Vitals:   2023/02/12 0600 2023-02-12 0732 12-Feb-2023 0800 Feb 12, 2023 0825  BP:   118/72   Pulse: 83 82    Resp: (!) 31 (!) 22    Temp:    98.5 F (36.9 C)  TempSrc:    Oral  SpO2: 96% 98%    Weight:      Height:        Intake/Output Summary (  Last 24 hours) at 01/13/2023 1125 Last data filed at 01/06/2023 0757 Gross per 24 hour  Intake 1370 ml  Output 1550 ml  Net -180 ml     Wt Readings from Last 3 Encounters:  01/21/2023 58.6 kg  01/12/23 62 kg  01/12/23 62.4 kg   Physical Exam General: Very somnolent, not following any commands Cardiovascular: S1 S2 clear, RRR.  Respiratory: CTAB anteriorly Gastrointestinal: Abdominal binder+, G-tube Ext: no pedal edema bilaterally Neuro: not following commands  Data Reviewed:  I have personally reviewed following labs    CBC Lab Results  Component Value Date   WBC 17.4 (H) 01/03/2023   RBC 3.19 (L) 01/11/2023   HGB 9.2 (L) 01/25/2023   HCT 29.6 (L) 01/24/2023   MCV 92.8 01/20/2023   MCH 28.8 01/24/2023   PLT 345 01/25/2023   MCHC 31.1 01/03/2023   RDW 14.7 01/13/2023   LYMPHSABS 0.2 (L) 01/07/2023   MONOABS 0.3 01/04/2023   EOSABS 0.0 01/03/2023   BASOSABS 0.0 01/29/2023     Last metabolic panel Lab Results  Component Value Date   NA 140 01/25/2023   K 4.0 01/21/2023   CL 107 01/07/2023   CO2 22 01/21/2023   BUN 24 (H) 01/29/2023   CREATININE 1.14 01/03/2023   GLUCOSE 148 (H) 01/18/2023   GFRNONAA >60 01/17/2023   GFRAA 50 (L) 06/19/2020   CALCIUM 7.9 (L) 01/05/2023   PHOS 4.2 01/20/2023   PROT 5.8 (L) 01/23/2023   ALBUMIN 1.7 (L) 01/30/2023   BILITOT 0.7 01/24/2023   ALKPHOS 106 01/13/2023   AST 30 01/24/2023   ALT 30 01/27/2023   ANIONGAP 11 01/11/2023    CBG (last 3)  Recent Labs    01/23/2023 0354 01/21/2023 0737 01/18/2023 0907  GLUCAP 141* 167* 138*       Coagulation Profile: No results for input(s): "INR", "PROTIME" in the last 168 hours.    Radiology Studies: I have personally reviewed the imaging studies  DG CHEST PORT 1 VIEW  Result Date: 01/01/2023 CLINICAL DATA:  Shortness of breath EXAM: PORTABLE CHEST 1 VIEW COMPARISON:  01/04/2023 FINDINGS: Previously seen pneumoperitoneum is diminishing, as visualized on this chest radiograph. Chronic interstitial lung markings persist, worse on the left than the right. Likely previous left lobectomy. No evidence of superimposed consolidation, collapse or effusion. There may be slightly worsened atelectasis at the left base. IMPRESSION: 1. Previously seen pneumoperitoneum is diminishing. 2. Chronic interstitial lung markings, worse on the left than the right. Possible slightly worsened atelectasis at the left base. Electronically Signed   By: Paulina Fusi M.D.   On: 01/01/2023 09:49       Rubyann Lingle M.D. [facility] 01/12/2023, 11:25 AM  Available via Epic secure chat 7am-7pm After 7 pm, please refer to night coverage provider listed on amion.

## 2023-01-31 NOTE — Progress Notes (Signed)
Palliative Medicine Progress Note   Patient Name: Nathaniel Long       Date: 01/21/2023 DOB: 09/17/1941  Age: 82 y.o. MRN#: [id number] Attending Physician: Cathren Harsh, MD Primary Care Physician: [name], MD Admit Date: 01/29/2023    HPI/Patient Profile: 82 y.o. 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 Due to weight loss and ongoing throat pain, he had been recommended for G-tube placement. He was evaluated by IR on 4/12, but G-tube placement was not done due to his anatomy.   He was admitted to [facility] on 01/27/2023 for direct laryngoscopy with findings of "posterior pharyngeal wall fully necrotic with spine palpable and depth of necrosis".  On 4/16, he went to the OR for G-tube placement.    Palliative Medicine has been consulted for goals of care.    Subjective:  14:35 - Assessed patient at bedside. His breathing is labored and he does not respond to voice or light touch.   I called and spoke with daughter-in-law [name]. I stated that patient appears to be actively dying and recommended DNR/DNI status. [name] is agreeable and reports that she and wife/[name] are on the way.   16:00 - I returned to bedside. BP is 60/43 and respiratory rate in the 30's.  I gently explained to family that patient is dying, and made recommendation to transition to comfort care to allow him to pass with peace and dignity. Discussed specifics of comfort care - keeping him clean and dry, no labs, no artificial hydration or feeding, no antibiotics, minimizing of medications, and giving medication for pain and dyspnea. Family is very tearful, but agreeable to comfort care at this time.  Education provided on natural trajectory at end-of-life. Emotional support provided.   Plan of care discussed with Dr. Isidoro Donning and patient's RN.    Objective:  Physical Exam Vitals reviewed.  Constitutional:      Appearance: He is ill-appearing.  Pulmonary:     Effort: Tachypnea present.  Neurological:     Mental Status: He is unresponsive.            Vital Signs: BP 116/70   Pulse 93   Temp 99.4 F (37.4 C) (Oral)   Resp (!) 28  Ht 5' 9.5" (1.765 m)   Wt 58.6 kg   SpO2 95%   BMI 18.80 kg/m  SpO2: SpO2: 95 % O2 Device: O2 Device: Nasal Cannula O2 Flow Rate: O2 Flow Rate (L/min): 5 L/min   Palliative Medicine Assessment & Plan   Assessment: Principal Problem:   Sepsis due to pneumonia Active Problems:   CKD (chronic kidney disease) stage 3, GFR 30-59 ml/min   COPD (chronic obstructive pulmonary disease)   Pharyngeal carcinoma   Dyslipidemia   Protein-calorie malnutrition, severe   Anemia   Goals of care, counseling/discussion    Recommendations/Plan: Code status changed to DNR/DNI Transition to comfort care  Discontinue labs, tube feeding, CBG monitoring Appreciate spiritual care support PMT will continue to support  Symptom Management:  Morphine prn for pain or dyspnea Lorazepam (ATIVAN) prn for anxiety Haloperidol (HALDOL) prn for agitation  Glycopyrrolate (ROBINUL) for excessive secretions Ondansetron (ZOFRAN) prn for nausea Polyvinyl alcohol (LIQUIFILM TEARS) prn for dry eyes Antiseptic oral rinse (BIOTENE) prn for dry mouth  Prognosis:  Hours - Days  Discharge Planning: Anticipated Hospital Death   Thank you for allowing the Palliative Medicine Team to assist in the care of this patient.   Greater than 50%  of this time was spent counseling and coordinating care related to the above assessment and plan.  Total time: 65 minutes   Merry Proud, NP Palliative Medicine   Please contact Palliative Medicine Team phone at [phone] for  questions and concerns.  For individual provider, see AMION.

## 2023-01-31 NOTE — Progress Notes (Signed)
Remaining contents of morphine bag wasted with patient's primary RN, Minerva Areola.  80mL wasted and contents of IV tubing.

## 2023-01-31 NOTE — Progress Notes (Signed)
PT Cancellation Note  Patient Details Name: Nathaniel Long MRN: [id number] DOB: 1940/11/03   Cancelled Treatment:    Reason Eval/Treat Not Completed: (P) Medical issues which prohibited therapy Pt with increased work of breathing, not responsive to painful stimuli. RN notified. PT will follow back to check on status.   Kaelan Amble B. Beverely Risen PT, DPT Acute Rehabilitation Services Please use secure chat or  Call Office [phone]    Elon Alas [facility] 01/12/2023, 9:10 AM

## 2023-01-31 NOTE — Progress Notes (Addendum)
Family called this nurse to room at 1955.  Patient having agonal respirations and appeared be actively passing.  Time of death Feb 14, 2001 with family at bedside.  MD and on-call chaplain notified.   Donor Services notified per protocol.  Addendum - morphine drip stopped at time of death.  80ml + contents of IV tubing wasted with Morrie Sheldon Psychologist, occupational) as witness.

## 2023-01-31 NOTE — Progress Notes (Signed)
Brief Nutrition Follow-up:  RD followed up with pt this AM and noted pt to be tachypneic  (RR in 40s). Pt lethargic but arousable. RD asks if pt is having a difficult time breathing to which he states "yes." RN aware and notified MD.  Electrolytes wdl.   Noted pt somnolent this AM  and required narcan x 2. Noted palliative care following, pt currently DNR.   Noted concern that pt may be aspirating TF.  Agree with holding TF at this time.   Romelle Starcher MS, RDN, LDN, CNSC Registered Dietitian 3 Clinical Nutrition RD Pager and On-Call Pager Number Located in [place]

## 2023-01-31 NOTE — Telephone Encounter (Signed)
Pt daughter called to inform that pt was still admitted, RN notified pt family that his appointments have been re-scheduled. No further needs at this time.

## 2023-01-31 NOTE — Progress Notes (Addendum)
OT Cancellation Note  Patient Details Name: Nathaniel Long MRN: [id number] DOB: 02/19/1941   Cancelled Treatment:    Reason Eval/Treat Not Completed: Fatigue/lethargy limiting ability to participate, not medically appropriate. Pt with increased work of breathing, not responsive to painful stimuli. RN notified. Will follow and see as able/appropriate.   Barry Brunner, OT Acute Rehabilitation Services Office [phone]   Chancy Milroy 01/27/2023, 9:11 AM

## 2023-01-31 NOTE — Progress Notes (Signed)
This chaplain responded to PMT NP-Julia page for EOL spiritual care for the Pt. family at the bedside. The chaplain listened reflectively to the Pt. story through multiple generations of a blended family. The chaplain understands the Pt. is loved by everyone, a great cook, and has a great sense of humor.   The RN is present with comfort medications after community clergy prayed with the Pt. and family. The chaplain updated tonight's on call chaplain for F/U if needed.  Chaplain Stephanie Acre [phone]

## 2023-01-31 NOTE — Death Summary Note (Addendum)
DEATH SUMMARY   Patient Details  Name: Nathaniel Long MRN: [id number] DOB: Mar 30, 1941 WUJ:[id number], Channing Mutters, MD Admission/Discharge Information   Admit Date:  24-Jan-2023  Date of Death: Date of Death: Jan 31, 2023  Time of Death: Time of Death: Feb 07, 2001  Length of Stay: 7   Principle Cause of death: Sepsis due to pneumonia   Hospital Diagnoses:    Sepsis due to pneumonia Acute metabolic encephalopathy    CKD (chronic kidney disease) stage 3a, GFR 30-59 ml/min   COPD (chronic obstructive pulmonary disease)   Pharyngeal carcinoma Dysphagia    Dyslipidemia   Protein-calorie malnutrition, severe   Anemia      Hospital Course: Patient was a 82 y.o. male with lung cancer, COPD, HTN, HLD, and pharyngeal carcinoma treated with radiation. He has had persistent throat pain and been unable to maintain his weight and so was admitted on 2023-01-24 for gastric tube placement. PEG tube was considered but could not be placed due to anatomy. He underwent direct laryngoscopy under anesthesia with findings of "Posterior pharyngeal wall fully necrotic with spine palpable in depth of necrosis. Extends into upper esophagus that could not be entered." Biopsies were taken and show cancer. He had gastrostomy tube placement on 4/16. When he returned from the procedure, BPs were in the 70s for an hour, he was febrile to 102, responded to IV fluids.  Medicine service was consulted.   Assessment and Plan:   Sepsis due to pneumonia, ?  Aspiration -Patient met sepsis criteria due to leukocytosis, fever, tachypnea, tachycardia, hypotension.  Possibly due to aspiration pneumonia -G-tube placed on 4/16 -placed on n.p.o. status, IV Unasyn,  -Blood cultures NTD  Acute metabolic encephalopathy, lethargy -on 2023-01-31, patient noted to be very lethargic, somnolent, not following any commands, have received morphine overnight. Received Narcan 0.4 mg x 2 -ABG showed pH 7.35, pCO2 49, pO2 70 - ammonia level 38, lactulose 30 g via  tube x 1 -Procalcitonin 0.35, WBC is trending up 17.4,  -Chest x-ray with no new worsening infiltrates.     Pharyngeal carcinoma -Diagnosed in 05/2022, deferred total laryngectomy and decided to do radiation for primarily palliative treatment -Worsening posterior neck pain, recent inability to tolerate PO -Underwent direct laryngoscopy on 01-24-23 and biopsies positive for cancer with perineural invasion -Seen by oncology on 4/18 (Dr Al Pimple), patient had not been interested in chemoradiation and not currently a candidate for surgery.  He was agreeable to palliative care and hospice.  Palliative medicine has been consulted.  Overall poor prognosis   Dysphagia -G-tube placed on 4/16 and started on tube feeds   Severe Hypokalemia, hypophosphatemia -replaced      Hypertension, tachycardia -placed on IV Lopressor      Normocytic anemia  -Received 1 unit packed RBCs on 4/16 for hemoglobin of 6.3  -H&H stable   Protein-calorie malnutrition, severe -Recent severe weight loss due to inability to take PO -Albumin is 1.8 -Tube feeds started   Dyslipidemia -On Zetia as an outpatient, currently on hold -Has not been taking statins     COPD (chronic obstructive pulmonary disease) -On Stiolto as an outpatient -Currently on Brovana, Incruse, Xopenex -No longer smoking   CKD (chronic kidney disease) stage 3a, GFR 30-59 ml/min -Appears to be stable at this time, at baseline 1.0-1.2   Goals of care: Palliative medicine and I discussed with patient's daughter [name] on the phone today. Overall poor prognosis with recurrent pharyngeal carcinoma, dysphagia, aspirating.  Palliative trajectory was discussed with patient and family by oncology. palliative  medicine consulted. Patient passed on 02-03-23 at 02-11-01. (8:02pm)       The results of significant diagnostics from this hospitalization (including imaging, microbiology, ancillary and laboratory) are listed below for reference.   Significant  Diagnostic Studies: DG CHEST PORT 1 VIEW  Result Date: Feb 03, 2023 CLINICAL DATA:  Shortness of breath EXAM: PORTABLE CHEST 1 VIEW COMPARISON:  01/25/2023 FINDINGS: Previously seen pneumoperitoneum is diminishing, as visualized on this chest radiograph. Chronic interstitial lung markings persist, worse on the left than the right. Likely previous left lobectomy. No evidence of superimposed consolidation, collapse or effusion. There may be slightly worsened atelectasis at the left base. IMPRESSION: 1. Previously seen pneumoperitoneum is diminishing. 2. Chronic interstitial lung markings, worse on the left than the right. Possible slightly worsened atelectasis at the left base. Electronically Signed   By: Paulina Fusi M.D.   On: 02/03/23 09:49   DG Chest Port 1 View  Result Date: 01/09/2023 CLINICAL DATA:  ARDS EXAM: PORTABLE CHEST 1 VIEW COMPARISON:  Chest radiograph 05/16/2021 and chest CT 01/06/2022 FINDINGS: Large volume pneumoperitoneum. Recent percutaneous gastrostomy tube. There is chronic volume loss and scarring in the left lung after resection but increased opacity at the left base. Left apical pleural based thickening which is chronic. Normal heart size. IMPRESSION: 1. Infiltrate at the left lung base, likely pneumonia or aspiration. 2. Recent percutaneous gastrostomy tube with large volume pneumoperitoneum. Electronically Signed   By: Tiburcio Pea M.D.   On: 01/25/2023 12:08   IR Fluoro Rm 30-60 Min  Result Date: 01/12/2023 INDICATION: Head and neck malignancy EXAM: Canceled G-tube placement MEDICATIONS: None ANESTHESIA/SEDATION: None CONTRAST:  None FLUOROSCOPY: Radiation Exposure Index (as provided by the fluoroscopic device): 1 mGy Kerma COMPLICATIONS: None immediate. PROCEDURE: Informed written consent was obtained from the patient after a thorough discussion of the procedural risks, benefits and alternatives. All questions were addressed. Scout image of the abdomen demonstrates colon  interposed anterior to the stomach. Safe percutaneous window identified. Procedure was terminated. IMPRESSION: Gastrostomy tube placement terminated given colon interposed anterior to the stomach. Electronically Signed   By: Acquanetta Belling M.D.   On: 01/12/2023 17:02   NM PET Image Restag (PS) Skull Base To Thigh  Result Date: 12/26/2022 CLINICAL DATA:  Subsequent treatment strategy for head and neck cancer. EXAM: NUCLEAR MEDICINE PET SKULL BASE TO THIGH TECHNIQUE: 7.9 mCi F-18 FDG was injected intravenously. Full-ring PET imaging was performed from the skull base to thigh after the radiotracer. CT data was obtained and used for attenuation correction and anatomic localization. Fasting blood glucose: 96 mg/dl COMPARISON:  16/07/9603 FINDINGS: Mediastinal blood pool activity: SUV max 2.8 Liver activity: SUV max NA NECK: Exam is limited by misregistration. There is symmetric homogeneous uptake, likely in the region of the vocal cords possibly related to patient's speaking after contrast injection. There is some debris in the posterior hypopharynx in this region, nonspecific. Activity is superimposed on the posterior hypopharynx, corresponding to the low level retro pharyngeal uptake seen on the previous study. Muscular uptake in the lower neck probably from patient motion. No definite hypermetabolic cervical lymphadenopathy. Incidental CT findings: None. CHEST: Previously identified left juxta hilar bandlike medial left upper lobe opacity is similar with persistent low level FDG uptake ( SUV max = 5.0 compared to 4.2 previously). Low level FDG uptake in a subcarinal node again noted with SUV max = 2.7 today compared to 3.0 previously. No hypermetabolic hilar or axillary lymphadenopathy. Nonspecific FDG uptake ( SUV max = 2.6) identified a lung the  vascular bundle to the posterior right lower lobe. 5 mm right middle lobe pulmonary nodule seen anteriorly on image 100/4 shows low level FDG accumulation with SUV max =  3.1. 5 mm posterior right middle lobe pulmonary nodule on 99/4 demonstrates SUV max = 3.7 Incidental CT findings: Coronary artery calcification is evident. Aortic valve calcification evident. Ascending thoracic aorta measures up to 4.4 cm diameter. Centrilobular and paraseptal emphysema evident. ABDOMEN/PELVIS: No abnormal hypermetabolic activity within the liver, pancreas, adrenal glands, or spleen. No hypermetabolic lymph nodes in the abdomen or pelvis. Incidental CT findings: There is advanced atherosclerotic calcification of the abdominal aorta without aneurysm. SKELETON: No focal hypermetabolic activity to suggest skeletal metastasis. Incidental CT findings: No worrisome lytic or sclerotic osseous abnormality. Old left-sided rib fractures evident. IMPRESSION: 1. Study limited today by misregistration in the region of the neck and upper chest. Symmetric hypermetabolism is identified in the region of the vocal cords and posterior retropharynx and posterior hypopharynx with some debris visible in the posterior hypopharynx on CT imaging. Findings are nonspecific and may be related to patient vocalization after radiotracer injection. As local recurrence cannot be excluded, direct visualization likely warranted. 2. No evidence for hypermetabolic cervical lymphadenopathy. 3. Two tiny right middle lobe pulmonary nodules with low level FDG accumulation are new in the interval. Potentially infectious/inflammatory, close follow-up recommended as metastatic disease not excluded. 4. Similar low level FDG accumulation in a subcarinal lymph node and bandlike soft tissue in the medial left upper lung. Both findings are likely benign/chronic. Attention on follow-up recommended. 5. No new sites of hypermetabolic disease in the abdomen or pelvis on today's study. 6. Aortic Atherosclerosis (ICD10-I70.0) and Emphysema (ICD10-J43.9). Electronically Signed   By: Kennith Center M.D.   On: 12/26/2022 09:58    Microbiology: Recent  Results (from the past 240 hour(s))  SARS Coronavirus 2 by RT PCR (hospital order, performed in [facility] hospital lab) *cepheid single result test* Anterior Nasal Swab     Status: None   Collection Time: 01/20/2023  5:38 AM   Specimen: Anterior Nasal Swab  Result Value Ref Range Status   SARS Coronavirus 2 by RT PCR NEGATIVE NEGATIVE Final    Comment: Performed at [facility], [address], [place], [place] [address]  Surgical PCR screen     Status: None   Collection Time: 01/05/2023 12:25 AM   Specimen: Nasal Mucosa; Nasal Swab  Result Value Ref Range Status   MRSA, PCR NEGATIVE NEGATIVE Final   Staphylococcus aureus NEGATIVE NEGATIVE Final    Comment: (NOTE) The Xpert SA Assay (FDA approved for NASAL specimens in patients 40 years of age and older), is one component of a comprehensive surveillance program. It is not intended to diagnose infection nor to guide or monitor treatment. Performed at [facility], [address] , [place] [address]   Culture, blood (Routine X 2) w Reflex to ID Panel     Status: None   Collection Time: 01/24/2023 10:20 AM   Specimen: BLOOD LEFT ARM  Result Value Ref Range Status   Specimen Description BLOOD LEFT ARM  Final   Special Requests   Final    BOTTLES DRAWN AEROBIC AND ANAEROBIC Blood Culture adequate volume   Culture   Final    NO GROWTH 5 DAYS Performed at [facility], [address], [place], [place] [address]    Report Status 01/21/2023 FINAL  Final  Culture, blood (Routine X 2) w Reflex to ID Panel  Status: None   Collection Time: 01/07/2023 10:23 AM   Specimen: BLOOD RIGHT HAND  Result Value Ref Range Status   Specimen Description BLOOD RIGHT HAND  Final   Special Requests   Final    BOTTLES DRAWN AEROBIC AND ANAEROBIC Blood Culture results may not be optimal due to an inadequate volume of blood received in culture bottles   Culture   Final    NO GROWTH 5 DAYS Performed at [facility], [address], [place], [place] [address]    Report Status 01/21/2023 FINAL  Final  Culture, blood (Routine X 2) w Reflex to ID Panel     Status: None (Preliminary result)   Collection Time: 01/25/23 10:04 AM   Specimen: BLOOD RIGHT WRIST  Result Value Ref Range Status   Specimen Description BLOOD RIGHT WRIST  Final   Special Requests   Final    BOTTLES DRAWN AEROBIC AND ANAEROBIC Blood Culture results may not be optimal due to an inadequate volume of blood received in culture bottles   Culture   Final    NO GROWTH < 24 HOURS Performed at [facility], [address], [place], [place] [address]    Report Status PENDING  Incomplete  Culture, blood (Routine X 2) w Reflex to ID Panel     Status: None (Preliminary result)   Collection Time: 25-Jan-2023 10:04 AM   Specimen: BLOOD LEFT HAND  Result Value Ref Range Status   Specimen Description BLOOD LEFT HAND  Final   Special Requests   Final    BOTTLES DRAWN AEROBIC AND ANAEROBIC Blood Culture results may not be optimal due to an inadequate volume of blood received in culture bottles   Culture   Final    NO GROWTH < 24 HOURS Performed at [facility], [address] [place], [place] [address]    Report Status PENDING  Incomplete     Signed: Thad Ranger, MD 01/23/23

## 2023-01-31 NOTE — Progress Notes (Signed)
Chaplain responded to request from nurse to offer support to family of deceased patient.  Chaplain found pt's son at bedside grieving the death of his father.  Son wanted to talk about what a wonderful dad he had.  Chaplain  listened as son told of the many things his father and mother had done for him.  Son requested to be present while the staff cleaned his father's body.  Son requested to follow his father to the morgue. Chaplain accompanied son to the morgue.  Vernell Morgans Chaplain.

## 2023-01-31 NOTE — Progress Notes (Signed)
This chaplain responded to PMT NP-Julia referral for follow up on notarizing the Pt. Advance Directive. The chaplain entered the Pt. room with the dietician. The chaplain observed the Pt. increased work of breathing with the ability to open and immediately close his eyes in response to my voice. The chaplain updated the Pt. RN-Justin and the PMT.  This chaplain will partner with PMT to determine a best time to proceed with notarizing the Pt. AD.  Chaplain Stephanie Acre [phone]

## 2023-01-31 DEATH — deceased

## 2023-02-02 ENCOUNTER — Ambulatory Visit: Payer: Medicare Other

## 2023-02-05 ENCOUNTER — Ambulatory Visit: Payer: Medicare Other

## 2023-02-06 ENCOUNTER — Telehealth: Payer: Medicare Other | Admitting: Hematology and Oncology

## 2023-02-07 ENCOUNTER — Ambulatory Visit: Payer: Medicare Other

## 2023-02-08 ENCOUNTER — Ambulatory Visit: Payer: Medicare Other | Admitting: Internal Medicine

## 2023-02-08 ENCOUNTER — Ambulatory Visit: Payer: Medicare Other

## 2023-02-09 ENCOUNTER — Ambulatory Visit: Payer: Medicare Other
